# Patient Record
Sex: Female | Born: 1940
Health system: Southern US, Community
[De-identification: ages and names within clinical notes are randomized; demographics above are authoritative.]

## PROBLEM LIST (undated history)

## (undated) DIAGNOSIS — I499 Cardiac arrhythmia, unspecified: Secondary | ICD-10-CM

## (undated) DIAGNOSIS — I2510 Atherosclerotic heart disease of native coronary artery without angina pectoris: Secondary | ICD-10-CM

## (undated) DIAGNOSIS — M1990 Unspecified osteoarthritis, unspecified site: Secondary | ICD-10-CM

## (undated) DIAGNOSIS — Z952 Presence of prosthetic heart valve: Secondary | ICD-10-CM

## (undated) DIAGNOSIS — M159 Polyosteoarthritis, unspecified: Secondary | ICD-10-CM

## (undated) DIAGNOSIS — H3530 Unspecified macular degeneration: Secondary | ICD-10-CM

## (undated) DIAGNOSIS — R0600 Dyspnea, unspecified: Secondary | ICD-10-CM

## (undated) DIAGNOSIS — I10 Essential (primary) hypertension: Secondary | ICD-10-CM

## (undated) DIAGNOSIS — D649 Anemia, unspecified: Secondary | ICD-10-CM

## (undated) DIAGNOSIS — M51379 Other intervertebral disc degeneration, lumbosacral region without mention of lumbar back pain or lower extremity pain: Secondary | ICD-10-CM

## (undated) DIAGNOSIS — I480 Paroxysmal atrial fibrillation: Secondary | ICD-10-CM

## (undated) DIAGNOSIS — M5137 Other intervertebral disc degeneration, lumbosacral region: Secondary | ICD-10-CM

## (undated) DIAGNOSIS — Z9889 Other specified postprocedural states: Secondary | ICD-10-CM

## (undated) DIAGNOSIS — I6529 Occlusion and stenosis of unspecified carotid artery: Secondary | ICD-10-CM

## (undated) DIAGNOSIS — K219 Gastro-esophageal reflux disease without esophagitis: Secondary | ICD-10-CM

## (undated) DIAGNOSIS — R011 Cardiac murmur, unspecified: Secondary | ICD-10-CM

## (undated) DIAGNOSIS — E785 Hyperlipidemia, unspecified: Secondary | ICD-10-CM

## (undated) DIAGNOSIS — R112 Nausea with vomiting, unspecified: Secondary | ICD-10-CM

## (undated) DIAGNOSIS — I872 Venous insufficiency (chronic) (peripheral): Secondary | ICD-10-CM

## (undated) DIAGNOSIS — F0781 Postconcussional syndrome: Secondary | ICD-10-CM

## (undated) DIAGNOSIS — I671 Cerebral aneurysm, nonruptured: Secondary | ICD-10-CM

## (undated) DIAGNOSIS — I82409 Acute embolism and thrombosis of unspecified deep veins of unspecified lower extremity: Secondary | ICD-10-CM

## (undated) HISTORY — DX: Gastro-esophageal reflux disease without esophagitis: K21.9

## (undated) HISTORY — DX: Hyperlipidemia, unspecified: E78.5

## (undated) HISTORY — PX: CARDIAC CATHETERIZATION: SHX172

## (undated) HISTORY — DX: Unspecified macular degeneration: H35.30

## (undated) HISTORY — PX: COLONOSCOPY: SHX174

## (undated) HISTORY — PX: INCISION AND DRAINAGE / EXCISION THYROGLOSSAL CYST: SUR667

## (undated) HISTORY — PX: HAMMER TOE SURGERY: SHX385

## (undated) HISTORY — DX: Venous insufficiency (chronic) (peripheral): I87.2

## (undated) HISTORY — DX: Atherosclerotic heart disease of native coronary artery without angina pectoris: I25.10

## (undated) HISTORY — PX: FRACTURE SURGERY: SHX138

## (undated) HISTORY — DX: Postconcussional syndrome: F07.81

## (undated) HISTORY — PX: FOOT FRACTURE SURGERY: SHX645

## (undated) HISTORY — DX: Occlusion and stenosis of unspecified carotid artery: I65.29

## (undated) HISTORY — DX: Anemia, unspecified: D64.9

## (undated) HISTORY — PX: TONSILLECTOMY: SUR1361

## (undated) HISTORY — DX: Polyosteoarthritis, unspecified: M15.9

## (undated) HISTORY — DX: Cerebral aneurysm, nonruptured: I67.1

## (undated) HISTORY — DX: Acute embolism and thrombosis of unspecified deep veins of unspecified lower extremity: I82.409

## (undated) HISTORY — PX: CATARACT EXTRACTION W/ INTRAOCULAR LENS  IMPLANT, BILATERAL: SHX1307

## (undated) HISTORY — DX: Essential (primary) hypertension: I10

## (undated) HISTORY — PX: JOINT REPLACEMENT: SHX530

## (undated) HISTORY — PX: VAGINAL HYSTERECTOMY: SUR661

## (undated) HISTORY — PX: LAPAROSCOPIC CHOLECYSTECTOMY: SUR755

---

## 1997-06-17 DIAGNOSIS — Z86718 Personal history of other venous thrombosis and embolism: Secondary | ICD-10-CM | POA: Insufficient documentation

## 2001-04-26 DIAGNOSIS — M899 Disorder of bone, unspecified: Secondary | ICD-10-CM | POA: Insufficient documentation

## 2002-05-21 DIAGNOSIS — I872 Venous insufficiency (chronic) (peripheral): Secondary | ICD-10-CM | POA: Insufficient documentation

## 2002-05-21 DIAGNOSIS — M1990 Unspecified osteoarthritis, unspecified site: Secondary | ICD-10-CM | POA: Insufficient documentation

## 2002-05-21 HISTORY — DX: Venous insufficiency (chronic) (peripheral): I87.2

## 2004-10-13 ENCOUNTER — Ambulatory Visit: Payer: Self-pay | Admitting: Cardiology

## 2004-10-17 DIAGNOSIS — M79606 Pain in leg, unspecified: Secondary | ICD-10-CM | POA: Insufficient documentation

## 2004-10-17 DIAGNOSIS — G8929 Other chronic pain: Secondary | ICD-10-CM | POA: Insufficient documentation

## 2004-11-02 ENCOUNTER — Ambulatory Visit: Payer: Self-pay

## 2005-02-06 HISTORY — PX: TOTAL HIP ARTHROPLASTY: SHX124

## 2005-03-17 DIAGNOSIS — Z9660 Presence of unspecified orthopedic joint implant: Secondary | ICD-10-CM | POA: Insufficient documentation

## 2005-11-02 ENCOUNTER — Ambulatory Visit: Payer: Self-pay | Admitting: Cardiology

## 2006-04-12 DIAGNOSIS — H9319 Tinnitus, unspecified ear: Secondary | ICD-10-CM | POA: Insufficient documentation

## 2006-04-17 DIAGNOSIS — I10 Essential (primary) hypertension: Secondary | ICD-10-CM

## 2006-04-17 HISTORY — DX: Essential (primary) hypertension: I10

## 2006-11-05 ENCOUNTER — Ambulatory Visit: Payer: Self-pay | Admitting: Cardiology

## 2006-11-14 ENCOUNTER — Ambulatory Visit: Payer: Self-pay

## 2007-03-15 DIAGNOSIS — Z9849 Cataract extraction status, unspecified eye: Secondary | ICD-10-CM | POA: Insufficient documentation

## 2007-11-06 ENCOUNTER — Ambulatory Visit: Payer: Self-pay | Admitting: Cardiology

## 2007-11-13 ENCOUNTER — Ambulatory Visit: Payer: Self-pay | Admitting: Cardiology

## 2008-11-27 DIAGNOSIS — I82409 Acute embolism and thrombosis of unspecified deep veins of unspecified lower extremity: Secondary | ICD-10-CM

## 2008-11-27 DIAGNOSIS — R002 Palpitations: Secondary | ICD-10-CM | POA: Insufficient documentation

## 2008-11-27 DIAGNOSIS — Q742 Other congenital malformations of lower limb(s), including pelvic girdle: Secondary | ICD-10-CM | POA: Insufficient documentation

## 2008-11-27 HISTORY — DX: Acute embolism and thrombosis of unspecified deep veins of unspecified lower extremity: I82.409

## 2008-12-01 ENCOUNTER — Ambulatory Visit: Payer: Self-pay | Admitting: Cardiology

## 2008-12-01 DIAGNOSIS — I359 Nonrheumatic aortic valve disorder, unspecified: Secondary | ICD-10-CM | POA: Insufficient documentation

## 2008-12-01 DIAGNOSIS — I6529 Occlusion and stenosis of unspecified carotid artery: Secondary | ICD-10-CM

## 2008-12-01 DIAGNOSIS — M25559 Pain in unspecified hip: Secondary | ICD-10-CM | POA: Insufficient documentation

## 2008-12-01 HISTORY — DX: Occlusion and stenosis of unspecified carotid artery: I65.29

## 2008-12-15 ENCOUNTER — Encounter: Payer: Self-pay | Admitting: Cardiology

## 2008-12-16 ENCOUNTER — Encounter: Payer: Self-pay | Admitting: Cardiovascular Disease

## 2008-12-17 ENCOUNTER — Encounter: Payer: Self-pay | Admitting: Cardiology

## 2008-12-17 ENCOUNTER — Ambulatory Visit: Payer: Self-pay

## 2008-12-17 ENCOUNTER — Ambulatory Visit: Payer: Self-pay | Admitting: Cardiology

## 2008-12-17 ENCOUNTER — Ambulatory Visit (HOSPITAL_COMMUNITY): Admission: RE | Admit: 2008-12-17 | Discharge: 2008-12-17 | Payer: Self-pay | Admitting: Cardiology

## 2009-12-02 ENCOUNTER — Encounter: Payer: Self-pay | Admitting: Cardiology

## 2009-12-03 ENCOUNTER — Ambulatory Visit: Payer: Self-pay | Admitting: Cardiology

## 2009-12-17 ENCOUNTER — Ambulatory Visit (HOSPITAL_COMMUNITY): Admission: RE | Admit: 2009-12-17 | Discharge: 2009-12-17 | Payer: Self-pay | Admitting: Cardiology

## 2009-12-17 ENCOUNTER — Ambulatory Visit: Payer: Self-pay | Admitting: Cardiology

## 2009-12-17 ENCOUNTER — Encounter: Payer: Self-pay | Admitting: Cardiology

## 2009-12-17 ENCOUNTER — Ambulatory Visit: Payer: Self-pay

## 2010-03-08 NOTE — Miscellaneous (Signed)
  Clinical Lists Changes  Observations: Added new observation of ECHOINTERP: - Left ventricle: The cavity size was normal. There was mild focal       basal hypertrophy of the septum. Systolic function was normal. The       estimated ejection fraction was in the range of 55% to 60%. Wall       motion was normal; there were no regional wall motion       abnormalities. Doppler parameters are consistent with abnormal       left ventricular relaxation (grade 1 diastolic dysfunction).     - Aortic valve: Valve mobility was restricted. There was moderate       stenosis. Mild regurgitation.     - Mitral valve: Mild regurgitation.     - Atrial septum: There was an atrial septal aneurysm. (02/17/2008 9:54)      Echocardiogram  Procedure date:  02/17/2008  Findings:      - Left ventricle: The cavity size was normal. There was mild focal       basal hypertrophy of the septum. Systolic function was normal. The       estimated ejection fraction was in the range of 55% to 60%. Wall       motion was normal; there were no regional wall motion       abnormalities. Doppler parameters are consistent with abnormal       left ventricular relaxation (grade 1 diastolic dysfunction).     - Aortic valve: Valve mobility was restricted. There was moderate       stenosis. Mild regurgitation.     - Mitral valve: Mild regurgitation.     - Atrial septum: There was an atrial septal aneurysm.

## 2010-03-08 NOTE — Assessment & Plan Note (Signed)
Summary: yearly/sl   Visit Type:  Follow-up Primary Provider:  Mirian Mo  CC:  Aortic Stenosis and Palpitations.  History of Present Illness: The patient presents for followup of the above. Since I last saw her she has had no acute cardiovascular problems. Her echo last year did demonstrate progression of aortic stenosis and mild to moderate. However, she's had no new symptoms such as shortness of breath, PND or orthopnea. She's had no chest pressure, neck or arm discomfort. She doesn't have any other palpitations anymore that she used to have. Unfortunately she is quite limited by joint pains though she is getting physical therapy. She has had no weight gain or edema. She seems to tolerate medicines as listed.  Current Medications (verified): 1)  Verapamil Hcl Cr 240 Mg Xr24h-Cap (Verapamil Hcl) .... Daily 2)  Propranolol Hcl 10 Mg Tabs (Propranolol Hcl) .... Two Times A Day As Needed 3)  Diclofenac Sodium 75 Mg Tbec (Diclofenac Sodium) .... Daily 4)  Alendronate Sodium 35 Mg Tabs (Alendronate Sodium) .... Weekly 5)  Vitamin D 1000 Unit Tabs (Cholecalciferol) .... Daily 6)  Aspirin 81 Mg Tbec (Aspirin) .... Take One Tablet By Mouth Daily 7)  Ibuprofen 200 Mg Tabs (Ibuprofen) .... Daily 8)  Fish Oil 1000 Mg Caps (Omega-3 Fatty Acids) .... Daily 9)  Gabapentin 600 Mg Tabs (Gabapentin) .Marland Kitchen.. 1 By Mouth Three Times A Day  Allergies (verified): 1)  ! Codeine  Past History:  Past Medical History:  1. Palpitations with premature atrial contractions and atrial       tachycardia.   2. Hammertoes.   3. Deep venous thrombosis after broken foot.   4. Moderate aortic stenosis  5. Mild carotid plaque  Past Surgical History: Reviewed history from 12/01/2008 and no changes required.  Tonsillectomy.  Hysterectomy.  Foot surgery.  Left hip replacement  Review of Systems       As stated in the HPI and negative for all other systems.   Vital Signs:  Patient profile:   70 year old  female Height:      66 inches Weight:      169 pounds BMI:     27.38 Pulse rate:   57 / minute Resp:     18 per minute BP sitting:   128 / 86  (right arm)  Vitals Entered By: Marrion Coy, CNA (December 03, 2009 11:18 AM)  Physical Exam  General:  Well developed, well nourished, in no acute distress. Head:  normocephalic and atraumatic Eyes:  PERRLA/EOM intact; conjunctiva and lids normal. Neck:  Neck supple, no JVD. No masses, thyromegaly or abnormal cervical nodes, transmitted systolic murmur Chest Wall:  no deformities or breast masses noted Lungs:  Clear bilaterally to auscultation and percussion. Abdomen:  Bowel sounds positive; abdomen soft and non-tender without masses, organomegaly, or hernias noted. No hepatosplenomegaly. Msk:  Back normal, normal gait. Muscle strength and tone normal. Extremities:  No clubbing or cyanosis. Neurologic:  Alert and oriented x 3. Skin:  Intact without lesions or rashes. Cervical Nodes:  no significant adenopathy Axillary Nodes:  no significant adenopathy Psych:  Normal affect.   Detailed Cardiovascular Exam  Neck    Carotids: Carotids full and equal bilaterally without bruits, transmitted systolic murmur    Neck Veins: Normal, no JVD.    Heart    Inspection: no deformities or lifts noted.      Palpation: normal PMI with no thrills palpable.      Auscultation: S1 and S2 within normal limits, no  S3, no S4, no clicks, no rubs, 3/6 apical systolic murmur radiating up the aortic outflow tract, no diastolic murmurs.  Vascular    Abdominal Aorta: no palpable masses, pulsations, or audible bruits.      Femoral Pulses: normal femoral pulses bilaterally.      Pedal Pulses: normal pedal pulses bilaterally.      Radial Pulses: normal radial pulses bilaterally.      Peripheral Circulation: no clubbing, cyanosis, or edema noted with normal capillary refill.     EKG  Procedure date:  12/03/2009  Findings:      Sinus bradycardia, rate 53,  left ventricular hypertrophy, left axis deviation  Impression & Recommendations:  Problem # 1:  AORTIC VALVE DISORDERS (ICD-424.1) Her AS progressed.  I will repeat an echo this year.  We discussed possible symptoms should it worsen.  Orders: Echocardiogram (Echo)  Problem # 2:  CAROTID STENOSIS (ICD-433.10) I will follow up carotid dopplers this year.  Orders: Carotid Duplex (Carotid Duplex)  Problem # 3:  PALPITATIONS (ICD-785.1) She is not symptomatic on meds as listed and will continue these. Orders: EKG w/ Interpretation (93000)  Patient Instructions: 1)  Your physician recommends that you schedule a follow-up appointment in: 12 months with Dr Antoine Poche 2)  Your physician recommends that you continue on your current medications as directed. Please refer to the Current Medication list given to you today. 3)  Your physician has requested that you have a carotid duplex. This test is an ultrasound of the carotid arteries in your neck. It looks at blood flow through these arteries that supply the brain with blood. Allow one hour for this exam. There are no restrictions or special instructions. 4)  Your physician has requested that you have an echocardiogram.  Echocardiography is a painless test that uses sound waves to create images of your heart. It provides your doctor with information about the size and shape of your heart and how well your heart's chambers and valves are working.  This procedure takes approximately one hour. There are no restrictions for this procedure.

## 2010-05-30 DIAGNOSIS — M159 Polyosteoarthritis, unspecified: Secondary | ICD-10-CM | POA: Insufficient documentation

## 2010-05-30 HISTORY — DX: Polyosteoarthritis, unspecified: M15.9

## 2010-06-14 ENCOUNTER — Telehealth: Payer: Self-pay | Admitting: Cardiology

## 2010-06-14 ENCOUNTER — Encounter: Payer: Self-pay | Admitting: Physician Assistant

## 2010-06-14 NOTE — Telephone Encounter (Signed)
All Cardiac faxed to [facility]/Dr.Prince @ [phone] 06/14/10/km

## 2010-06-16 ENCOUNTER — Ambulatory Visit (INDEPENDENT_AMBULATORY_CARE_PROVIDER_SITE_OTHER): Payer: Medicare Other | Admitting: Physician Assistant

## 2010-06-16 ENCOUNTER — Encounter: Payer: Self-pay | Admitting: Physician Assistant

## 2010-06-16 VITALS — BP 152/70 | HR 76 | Resp 18 | Ht 66.0 in | Wt 171.8 lb

## 2010-06-16 DIAGNOSIS — R079 Chest pain, unspecified: Secondary | ICD-10-CM

## 2010-06-16 DIAGNOSIS — R0602 Shortness of breath: Secondary | ICD-10-CM

## 2010-06-16 DIAGNOSIS — I359 Nonrheumatic aortic valve disorder, unspecified: Secondary | ICD-10-CM

## 2010-06-16 NOTE — Patient Instructions (Addendum)
Your physician recommends that you schedule a follow-up appointment in: 1 WEEK WITH DR. HOCHREIN AS PER SCOTT WEAVER, PA-C, PT IS GOING OUT OF TOWN AND PER SCOTT WEAVER, PA-C PT NEEDS TO SEE DR. HOCHREIN.  Your physician has requested that you have a REST lexiscan Myoview 786.05, 786.50. For further information please visit [email or web address] Please follow instruction sheet, as given.  Your physician has requested that you have an 2D echocardiogram 424.1 AORTIC STENOSIS. Echocardiography is a painless test that uses sound waves to create images of your heart. It provides your doctor with information about the size and shape of your heart and how well your heart's chambers and valves are working. This procedure takes approximately one hour. There are no restrictions for this procedure.  Your physician recommends that you return for lab work in: TODAY BMET, BNP 786.05, 786.50.

## 2010-06-16 NOTE — Assessment & Plan Note (Addendum)
Her symptoms are typical and atypical features.  She has moderately severe aortic stenosis.  I reviewed her echocardiogram with Dr. Eden Emms.  I do not see that she's ever been evaluated for ischemia.  She will be set up for a [employer].  We will also repeat a 2-D echocardiogram.  I will bring her back in close follow up with Dr. Antoine Poche or myself on a day that he is here to review her studies.

## 2010-06-16 NOTE — Assessment & Plan Note (Signed)
Repeat 2-D echocardiogram as noted to assess for progression of aortic stenosis.

## 2010-06-16 NOTE — Assessment & Plan Note (Signed)
Obtain a Myoview and 2-D echocardiogram as noted.  Also obtain basic metabolic panel and BNP.  Add diuretics if her BNP is elevated.

## 2010-06-16 NOTE — Progress Notes (Signed)
History of Present Illness: Primary Cardiologist:  Dr. Rollene Rotunda  Mikayla [name] is a 70 y.o. female With a history of palpitations and aortic stenosis.  She had carotid Dopplers done in November 2011 that demonstrated 0-39% bilateral ICA stenosis.  Her most recent echocardiogram was performed in November 2011 and demonstrated normal LV function with an EF of 60% and moderate to severe aortic stenosis with a mean gradient of 32 mm of mercury.  She is referred back by her primary care physician today secondary to chest symptoms and shortness of breath.  These have been ongoing for the last 2 weeks.  She has a chest tightness.  It can come on at rest.  She can get it with exertion.  She can exert herself without symptoms.  She does have associated shortness of breath with her symptoms.  She describes probable NYHA class II to class IIb symptoms.  She denies orthopnea or PND.  She has some mild pedal edema.  She feels overly fatigued.  She feels as though her abdomen is tight.  She denies any significant weight change.  She does have a problem with her right arm and is being evaluated by her PCP.  There seems to be some pain radiating up into her jaw from this.  She denies any jaw symptoms associated with her chest discomfort.  She denies syncope.  She does have some dizziness.  She denies a spinning sensation.  She denies near syncope.   Past Medical History  Diagnosis Date  . Acute venous embolism and thrombosis of unspecified deep vessels of lower extremity     after broken foot  . Aortic stenosis     echo 11/11: EF 60%, mild LVH, mod/severe AS with mean 32 mmHg, peak 55 mmHg, mild AI, mild MR, (previous echo 2010 with mean gradient 29 mmHg)  . Carotid stenosis     dopplers 11/11: 0-39% bilat  . Palpitations     PACs and atrial tachy  . Other congenital anomaly of toes   . Pain in joint, pelvic region and thigh   . Atrial tachycardia   . Dyslipidemia     Current Outpatient Prescriptions    Medication Sig Dispense Refill  . alendronate (FOSAMAX) 35 MG tablet Take by mouth every 7 (seven) days. Take with a full glass of water on an empty stomach.       Marland Kitchen aspirin 81 MG tablet Take 81 mg by mouth daily.        Marland Kitchen atropine-PHENObarbital-scopolamine-hyoscyamine (DONNATAL) 16.2 MG tablet Take 1 tablet by mouth as needed.        . cholecalciferol (VITAMIN D) 1000 UNITS tablet Take 1,000 Units by mouth daily.        . diclofenac (VOLTAREN) 75 MG EC tablet Take by mouth daily.        Marland Kitchen gabapentin (NEURONTIN) 600 MG tablet Take 600 mg by mouth 2 (two) times daily.        . IBUPROFEN PO Take by mouth. 200 mg daily.       . meloxicam (MOBIC) 15 MG tablet Take 15 mg by mouth daily.        . Multiple Vitamin (MULTIVITAMIN) tablet Take 1 tablet by mouth daily.        Marland Kitchen omega-3 fish oil (MAXEPA) 1000 MG CAPS capsule Take 1 capsule by mouth daily.       . propranolol (INDERAL) 10 MG tablet Take 1 tablet by mouth as directed. Twice a day and every 4 hours  as needed for fast heart rate.      Marland Kitchen VERAPAMIL HCL CR PO Take by mouth. 240 mg Mg Xr24h 1 capsule daily.       Marland Kitchen DISCONTD: GABAPENTIN PO Take by mouth. 600 mg. Take 1 by mouth 3 times a day.        Allergies  Allergen Reactions  . Codeine     caused nausea  . Indomethacin     Caused chest pain.    History  Substance Use Topics  . Smoking status: Never Smoker   . Smokeless tobacco: Not on file  . Alcohol Use: No    Family History  Problem Relation Age of Onset  . Heart disease Mother     ROS:  Please see the history of present illness.  She denies fevers, chills, cough, melena, hematochezia, vomiting, diarrhea.  All other systems reviewed and negative.  Vital Signs: BP 152/70  Pulse 76  Resp 18  Ht 5\' 6"  (1.676 m)  Wt 171 lb 12.8 oz (77.928 kg)  BMI 27.73 kg/m2  PHYSICAL EXAM: Well nourished, well developed, in no acute distress HEENT: normal Neck: no JVD Endocrine: No thyromegaly Vascular: Carotids 2+  bilaterally Cardiac:  normal S1, S2; RRR; Harsh 2/6 crescendo decrescendo systolic murmur heard best at the RUSB Lungs:  clear to auscultation bilaterally, no wheezing, rhonchi or rales Abd: soft, nontender, no hepatomegaly Ext: no edema Skin: warm and dry Neuro:  CNs 2-12 intact, no focal abnormalities noted  EKG:  Sinus rhythm, heart rate 76, left axis deviation, LVH, nonspecific ST-T wave changes, no significant change when compared to tracing dated 12/03/09  ASSESSMENT AND PLAN:

## 2010-06-17 LAB — BASIC METABOLIC PANEL
BUN: 24 mg/dL — ABNORMAL HIGH (ref 6–23)
Calcium: 9.1 mg/dL (ref 8.4–10.5)
Creatinine, Ser: 0.7 mg/dL (ref 0.4–1.2)
GFR: 92.04 mL/min (ref >60.00)
Glucose, Bld: 107 mg/dL — ABNORMAL HIGH (ref 70–99)
Potassium: 3.9 mEq/L (ref 3.5–5.1)

## 2010-06-17 LAB — BRAIN NATRIURETIC PEPTIDE: Pro B Natriuretic peptide (BNP): 186 pg/mL — ABNORMAL HIGH (ref 0.0–100.0)

## 2010-06-20 ENCOUNTER — Telehealth: Payer: Self-pay | Admitting: Cardiology

## 2010-06-20 ENCOUNTER — Encounter: Payer: Self-pay | Admitting: *Deleted

## 2010-06-20 NOTE — Telephone Encounter (Signed)
Pt returning your call

## 2010-06-20 NOTE — Telephone Encounter (Signed)
SEE PHONE NOTE

## 2010-06-21 ENCOUNTER — Ambulatory Visit (HOSPITAL_COMMUNITY): Payer: Medicare Other | Attending: Cardiology | Admitting: Radiology

## 2010-06-21 ENCOUNTER — Ambulatory Visit (HOSPITAL_BASED_OUTPATIENT_CLINIC_OR_DEPARTMENT_OTHER): Payer: Medicare Other | Admitting: Radiology

## 2010-06-21 ENCOUNTER — Encounter: Payer: Self-pay | Admitting: Physician Assistant

## 2010-06-21 ENCOUNTER — Ambulatory Visit (INDEPENDENT_AMBULATORY_CARE_PROVIDER_SITE_OTHER): Payer: Medicare Other | Admitting: Physician Assistant

## 2010-06-21 VITALS — BP 124/76 | HR 80 | Ht 66.0 in | Wt 170.0 lb

## 2010-06-21 DIAGNOSIS — R072 Precordial pain: Secondary | ICD-10-CM

## 2010-06-21 DIAGNOSIS — R0989 Other specified symptoms and signs involving the circulatory and respiratory systems: Secondary | ICD-10-CM

## 2010-06-21 DIAGNOSIS — I4949 Other premature depolarization: Secondary | ICD-10-CM

## 2010-06-21 DIAGNOSIS — R0789 Other chest pain: Secondary | ICD-10-CM

## 2010-06-21 DIAGNOSIS — R0609 Other forms of dyspnea: Secondary | ICD-10-CM

## 2010-06-21 DIAGNOSIS — I359 Nonrheumatic aortic valve disorder, unspecified: Secondary | ICD-10-CM

## 2010-06-21 DIAGNOSIS — R079 Chest pain, unspecified: Secondary | ICD-10-CM

## 2010-06-21 DIAGNOSIS — R0602 Shortness of breath: Secondary | ICD-10-CM

## 2010-06-21 MED ORDER — TECHNETIUM TC 99M TETROFOSMIN IV KIT
11.0000 | PACK | Freq: Once | INTRAVENOUS | Status: AC | PRN
  Administered 2010-06-21: 11 via INTRAVENOUS

## 2010-06-21 MED ORDER — TECHNETIUM TC 99M TETROFOSMIN IV KIT
33.0000 | PACK | Freq: Once | INTRAVENOUS | Status: AC | PRN
  Administered 2010-06-21: 33 via INTRAVENOUS

## 2010-06-21 MED ORDER — REGADENOSON 0.4 MG/5ML IV SOLN
0.4000 mg | Freq: Once | INTRAVENOUS | Status: AC
  Administered 2010-06-21: 0.4 mg via INTRAVENOUS

## 2010-06-21 NOTE — Assessment & Plan Note (Signed)
[facility]                            CARDIOLOGY OFFICE NOTE   NAME:Mikayla [name], Mikayla [name]                          MRN:          [id number]  DATE:11/06/2007                            DOB:          1941-01-10    PRIMARY CARE PHYSICIAN:  Dr. Mirian Mo.   REASON FOR PRESENTATION:  Evaluate the patient with aortic sclerosis and  premature atrial contractions.   HISTORY OF PRESENT ILLNESS:  The patient is 70 years old.  Since I last  saw her, she has been relatively well.  She still gets episodes of  dizziness and lightheadedness, though she has not had any syncope.  This  has been a chronic and long-standing problem.  It happens sporadically.  It may happen a few days in a row and then not happen for weeks at a  time.  She does not associate it with palpitations.  She does not  associate it with change in position.  She does not have any loss of  vision, motor, or speech.  She is not having these with any increasing  frequency or intensity.  She has undergone evaluation with monitoring in  the past.   The patient uses an exercise bike 2-3 times per week.  She has been  having some mild shortness of breath.  This is again episodic and not  really associated with the biking.  She does not bring on any symptoms  when she does this level of exercise or her usual activities.  She is  not having PND or orthopnea.   PAST MEDICAL HISTORY:  1. Palpitations with premature atrial contractions and atrial      tachycardia, moderate aortic sclerosis with mild stenosis, well-      preserved ejection fraction.  2. Hammertoes.  3. Deep venous thrombosis after broken foot.  4. Tonsillectomy.  5. Hysterectomy.  6. Thyroglossal duct cyst resection.  7. Foot surgery.   ALLERGIES:  CODEINE caused nausea, INDOMETHACIN caused chest pain.   MEDICATIONS:  1. Fosamax 35 mg every week.  2. Verapamil 240 mg daily.  3. Multivitamin.  4. Omega-3.  5. Aspirin 81 mg daily.  6. Propranolol 20 mg daily.  7. Diclofenac.   REVIEW OF SYSTEMS:  As stated in the HPI and otherwise negative for all  other systems.   PHYSICAL EXAMINATION:  GENERAL:  The patient is in no distress.  VITAL SIGNS:  Blood pressure 133/80, heart rate 67 and regular, weight  171 pounds, and body mass index 29.  HEENT:  Eyelids unremarkable, pupils are equal, round, and reactive to  light, fundi not visualized, oral mucosa unremarkable.  NECK:  No jugular venous distention, waveform within normal limits,  carotid upstroke brisk and symmetric, bilateral transmitted systolic  murmurs, no thyromegaly.  LYMPHATICS:  No cervical, axillary, or inguinal adenopathy.  LUNGS:  Clear to auscultation bilaterally.  BACK:  No costovertebral angle tenderness.  CHEST:  Unremarkable.  HEART:  PMI not displaced or sustained, S1 and S2 within normal limits.  No S3, S4, 3/6 apical systolic murmur radiating slightly at the right  upper  sternal border and early peaking, no diastolic murmurs.  ABDOMEN:  Flat, positive bowel sounds.  Normal in frequency and pitch,  no bruits, no rebound, no guarding or midline pulsatile mass,  organomegaly.  SKIN:  No rashes, no nodules.  EXTREMITIES:  Pulses are 2+, no edema.   EKG:  Sinus rhythm, rate 64, left ventricular hypertrophy by voltage  criteria, left axis deviation, poor anterior R-wave progression, cannot  exclude lateral infarct.   ASSESSMENT AND PLAN:  1. Dizziness.  The patient has some episodes of dizziness.  This has      been a chronic problem.  There has been no clear etiology.  I do      not believe there to be a cardiac etiology.  She would not want to      wear another monitor, as these are fairly infrequent and not any      worse than previous.  At this point, no further cardiac workup is      suggested.  2. Atrial tachycardia.  The patient had atrial tachycardia and      frequent premature atrial contractions in the past.  I am going to      have  her wear a 24-hour Holter monitor to make sure I see no      evidence of atrial fibrillation, which would necessitate Coumadin.  3. Aortic sclerosis.  This murmur sounds unchanged.  She has had no      symptoms suggesting worsening stenosis.  No further evaluation is      planned.  4. Dyslipidemia, followed by Dr. Criss Alvine.  5. Transmitted systolic murmur in the carotids.  Last year, I      evaluated this with carotid Dopplers, and there was 0 to 39%      plaque.  No followup is necessary at this point.  6. Followup.  We will see her back in 1 year or sooner if she has any      increasing problems or abnormalities on the Holter.      Rollene Rotunda, MD, [facility]  Electronically Signed   JH/MedQ  DD: 11/06/2007  DT: 11/07/2007  Job #: [id number]   cc:   W.D. Criss Alvine

## 2010-06-21 NOTE — Assessment & Plan Note (Signed)
I reviewed her stress test and echocardiogram with her today.  She does not have any ischemia on her stress test.  She does have some symptoms that sound suspicious for increased acid.  I've asked her to get Zantac and take 75 mg twice a day.  She did have some mild elevation in her BNP and she can take the Lasix for 3 days.  She can take as needed after that.

## 2010-06-21 NOTE — Assessment & Plan Note (Signed)
[facility]                            CARDIOLOGY OFFICE NOTE   NAME:Mikayla [name], Mikayla [name]                          MRN:          [id number]  DATE:11/05/2006                            DOB:          10/25/1940    REASON FOR PRESENTATION:  Patient with aortic sclerosis and  palpitations.   HISTORY OF PRESENT ILLNESS:  The patient is now 70 years old.  She has  done well since I last saw her.  She is not describing any new symptoms  consistent with progressive valve disease, that is, she has no new  dyspnea, chest discomfort, pre-syncope or syncope.  She does  occasionally get palpitations and may treat this with an extra dose of  propranolol.  However, this has been very limited.  She is limited in  her activities, because of hip replacement and some discomfort.  She can  ride an exercise bicycle without limitations, aside from the orthopedic  ones.   PAST MEDICAL HISTORY:  Palpitations with premature atrial contractions  and atrial tachycardia, moderate aortic sclerosis with mild stenosis,  well-preserved ejection fraction, hammer toes, deep venous thrombosis  after a broken foot, tonsillectomy, hysterectomy, thyroglossal duct cyst  resection, foot surgery.   ALLERGIES:  CODEINE, CAUSED NAUSEA.  INDOMETHACIN CAUSED CHEST PAIN.   MEDICATIONS:  Fosamax weekly, Verapamil 240 mg daily, multivitamin,  Omega 3, aspirin 81 mg daily.   REVIEW OF SYSTEMS:  As stated in the HPI, otherwise negative for other  systems.   PHYSICAL EXAMINATION:  GENERAL:  The patient is in no distress.  VITAL SIGNS:  Blood pressure 140/80, heart rate 66 and regular, weight  173 pounds, body mass index 29.  HEENT:  Eyes unremarkable.  Pupils equal, round, reactive to light.  Fundi not visualized, oral mucosa unremarkable.  NECK:  No jugular venous distention, wave form within normal limits.  Carotid upstroke brisk and symmetric, bilateral carotid bruits versus  transmitted systolic  murmur, no thyromegaly.  LYMPHATICS:  No supraclavicular, axillary or inguinal adenopathy.  LUNGS:  Clear to auscultation bilaterally.  BACK:  No costovertebral angle tenderness.  CHEST:  Unremarkable.  HEART:  PMI not displaced or sustained, S1 and S2 within normal limits.  No S3, no S4, 3/6 apical systolic murmur, heard best at the right upper  sternal border and early peaking, no diastolic murmurs.  ABDOMEN:  Flat, positive bowel sounds, normal in frequency and pitch, no  bruits, no rebound, no guarding, no midline pulsatile masses or  organomegaly.  SKIN:  No rash, no nodules  EXTREMITIES:  2+ pulses, no edema.   ASSESSMENT/PLAN:  1. Aortic sclerosis/stenosis.  This is probably still mild by the      clinical description.  There is nothing to suggest worsening of      this.  She does have some left ventricular hypertrophy.  At this      point, I would follow this clinically.  I will probably get an      echocardiogram in another year, as it will have been 3 years.  2. Dyslipidemia.  This will be followed by  Dr. Margarito Liner. Prince.  3. Palpitations.  These are not particularly problematic, and she self      medicates with propranolol, which is reasonable.  4. Bruits.  This is either a bruits or transmitted systolic murmur.      Never excluded obstructive carotid disease, and so she should have      a carotid Doppler.  5. Followup.  I will her back in about 12 months or sooner if needed.     Rollene Rotunda, MD, [facility]  Electronically Signed    JH/MedQ  DD: 11/05/2006  DT: 11/05/2006  Job #: [id number]

## 2010-06-21 NOTE — Progress Notes (Signed)
History of Present Illness: Primary Cardiologist:  Dr. Rollene Rotunda  Mikayla [name] is a 70 y.o. female with a history of palpitations and aortic stenosis.  She had carotid Dopplers done in November 2011 that demonstrated 0-39% bilateral ICA stenosis.  Her most recent echocardiogram was performed in November 2011 and demonstrated normal LV function with an EF of 60% and moderate to severe aortic stenosis with a mean gradient of 32 mm of mercury.  I saw her last week with chest pain, dyspnea and ankle edema.  Her BNP was mildly elevated and I recommended she take Lasix 20 mg QD x 3 days.  She has not started this yet.  She was set up for a [employer] and echo which were done today.  I reviewed her Myoview with Dr. Antoine Poche and this demonstrates no ischemia with normal LVF.  I reviewed her echo with Dr. Antoine Poche and this demonstrates stable AS with AVA 1.08, mean gradient of 40 mmHg.  She still has moderately severe AS with significant change.  She notes heaviness with bending forward and increased belching.  No syncope.  No orthopnea or PND.    Past Medical History  Diagnosis Date  . Acute venous embolism and thrombosis of unspecified deep vessels of lower extremity     after broken foot  . Aortic stenosis     echo 11/11: EF 60%, mild LVH, mod/severe AS with mean 32 mmHg, peak 55 mmHg, mild AI, mild MR, (previous echo 2010 with mean gradient 29 mmHg)  . Carotid stenosis     dopplers 11/11: 0-39% bilat  . Palpitations     PACs and atrial tachy  . Other congenital anomaly of toes   . Pain in joint, pelvic region and thigh   . Atrial tachycardia   . Dyslipidemia     Current Outpatient Prescriptions  Medication Sig Dispense Refill  . alendronate (FOSAMAX) 35 MG tablet Take by mouth every 7 (seven) days. Take with a full glass of water on an empty stomach.       Marland Kitchen aspirin 81 MG tablet Take 81 mg by mouth daily.        . cholecalciferol (VITAMIN D) 1000 UNITS tablet Take 1,000 Units by mouth  daily.        Marland Kitchen gabapentin (NEURONTIN) 600 MG tablet Take 600 mg by mouth 2 (two) times daily.        . meloxicam (MOBIC) 15 MG tablet Take 15 mg by mouth daily.        . Multiple Vitamin (MULTIVITAMIN) tablet Take 1 tablet by mouth daily.        Marland Kitchen omega-3 fish oil (MAXEPA) 1000 MG CAPS capsule Take 1 capsule by mouth daily.       . propranolol (INDERAL) 10 MG tablet Take 1 tablet by mouth as directed. Twice a day and every 4 hours as needed for fast heart rate.      Marland Kitchen VERAPAMIL HCL CR PO Take by mouth. 240 mg Mg Xr24h 1 capsule daily.       . furosemide (LASIX) 20 MG tablet Take 1 tablet by mouth daily.      . potassium chloride (K-DUR) 10 MEQ tablet Take 1 tablet by mouth daily.      Marland Kitchen DISCONTD: atropine-PHENObarbital-scopolamine-hyoscyamine (DONNATAL) 16.2 MG tablet Take 1 tablet by mouth as needed.        Marland Kitchen DISCONTD: diclofenac (VOLTAREN) 75 MG EC tablet Take by mouth daily.        Marland Kitchen DISCONTD: IBUPROFEN  PO Take by mouth. 200 mg daily.        No current facility-administered medications for this visit.   Facility-Administered Medications Ordered in Other Visits  Medication Dose Route Frequency Provider Last Rate Last Dose  . regadenoson (LEXISCAN) injection SOLN 0.4 mg  0.4 mg Intravenous Once Rollene Rotunda, MD   0.4 mg at 06/21/10 1024  . technetium tetrofosmin (TC-MYOVIEW) injection 11 milli Curie  11 milli Curie Intravenous Once PRN Rollene Rotunda, MD   11 milli Curie at 06/21/10 [phone]  . technetium tetrofosmin (TC-MYOVIEW) injection 33 milli Curie  33 milli Curie Intravenous Once PRN Rollene Rotunda, MD   33 milli Curie at 06/21/10 1025    Allergies  Allergen Reactions  . Codeine     caused nausea  . Indomethacin     Caused chest pain.    Vital Signs: BP 124/76  Pulse 80  Ht 5\' 6"  (1.676 m)  Wt 170 lb (77.111 kg)  BMI 27.44 kg/m2  PHYSICAL EXAM: Well nourished, well developed, in no acute distress HEENT: normal Neck: no JVD Cardiac:  normal S1, S2; RRR; Harsh 2/6  crescendo decrescendo systolic murmur heard best at the RUSB Lungs:  clear to auscultation bilaterally, no wheezing, rhonchi or rales Abd: soft, nontender, no hepatomegaly Ext: no edema Skin: warm and dry Neuro:  CNs 2-12 intact, no focal abnormalities noted  ASSESSMENT AND PLAN:

## 2010-06-21 NOTE — Patient Instructions (Addendum)
Take Lasix and Potassium once daily for 3 days. Then, take them only as needed (if you notice swelling or increased shortness of breath). Get Zantac 75 mg over the counter and take twice daily for 2-3 weeks.  Then, take as needed.   Your physician recommends that you schedule a follow-up appointment in: 3 MONTHS WITH DR. HOCHREIN 09/27/10 @ 9:45 AM

## 2010-06-21 NOTE — Progress Notes (Addendum)
[facility] SITE 3 NUCLEAR MED [address] [place] [address] [id number]  Cardiology Nuclear Med Study  Mikayla [name] is a 70 y.o. female [id number] 02-Jul-1940   Nuclear Med Background Indication for Stress Test:  Evaluation for Ischemia History:  '11 Echo:EF=60%, moderate-severe AS. Cardiac Risk Factors: Carotid Disease, Family History - CAD, Hypertension and Lipids  Symptoms:  Chest Tightness with and without Exertion (last episode of chest discomfort was last Sunday morning), Dizziness, DOE, Fatigue, Palpitations, Rapid HR and SOB with CP   Nuclear Pre-Procedure Caffeine/Decaff Intake:  None NPO After: 7:00am   Lungs:  Clear.  O2 sat 97% on RA. IV 0.9% NS with Angio Cath:  20g  IV Site: R Antecubital  IV Started by:  Irean Hong, RN  Chest Size (in):  36 Cup Size: C  Height: 5\' 6"  (1.676 m)  Weight:  170 lb (77.111 kg)  BMI:  Body mass index is 27.44 kg/(m^2). Tech Comments:  Took propranolol this am    Nuclear Med Study 1 or 2 day study: 1 day  Stress Test Type:  Lexiscan  Reading MD: Marca Ancona, MD  Order Authorizing Provider:  Rollene Rotunda, MD  Resting Radionuclide: Technetium 27m Tetrofosmin  Resting Radionuclide Dose: 11 mCi   Stress Radionuclide:  Technetium 45m Tetrofosmin  Stress Radionuclide Dose: 33 mCi           Stress Protocol Rest HR: 54 Stress HR: 84  Rest BP: 122/69 Stress BP: 122/78  Exercise Time (min): n/a METS: n/a   Predicted Max HR: 149 bpm % Max HR: 56.38 bpm Rate Pressure Product: 21308   Dose of Adenosine (mg):  n/a Dose of Lexiscan: 0.4 mg  Dose of Atropine (mg): n/a Dose of Dobutamine: n/a mcg/kg/min (at max HR)  Stress Test Technologist: Smiley Houseman, CMA-N  Nuclear Technologist:  Domenic Polite, CNMT     Rest Procedure:  Myocardial perfusion imaging was performed at rest 45 minutes following the intravenous administration of Technetium 51m Tetrofosmin.  Rest ECG: Sinus bradycardia, occasional  PVC's.  Stress Procedure:  The patient received IV Lexiscan 0.4 mg over 15-seconds.  Technetium 46m Tetrofosmin injected at 30-seconds.  There were no significant changes with Lexiscan, only occasional PVC's.  Quantitative spect images were obtained after a 45 minute delay.  Stress ECG: No significant change from baseline ECG  QPS Raw Data Images:  Normal; no motion artifact; normal heart/lung ratio. Stress Images:  Normal homogeneous uptake in all areas of the myocardium. Rest Images:  Normal homogeneous uptake in all areas of the myocardium. Subtraction (SDS):  Normal Transient Ischemic Dilatation (Normal <1.22):  1.05 Lung/Heart Ratio (Normal <0.45):  0.21  Quantitative Gated Spect Images QGS EDV:  84 ml QGS ESV:  26 ml QGS cine images:  NL LV Function; NL Wall Motion QGS EF: 69%  Impression Exercise Capacity:  Lexiscan with no exercise. BP Response:  n/a Clinical Symptoms:  n/a ECG Impression:  No significant ECG changes with Lexiscan. Comparison with Prior Nuclear Study: No previous nuclear study performed  Overall Impression:  Normal stress nuclear study.   Mikayla [name]   Discussed with the patient at the time of the appt.  No new cardiovascular work up needed.  Rollene Rotunda

## 2010-06-21 NOTE — Assessment & Plan Note (Signed)
This continues to be moderately severe.  It does not appear to be the cause of her chest symptoms.  She will follow up with Dr. Antoine Poche in 3 months.  We will likely need to do serial echocardiograms every 6 months for now.

## 2010-06-22 NOTE — Progress Notes (Addendum)
Copy routed to Dr. Antoine Poche.Mikayla [name]

## 2010-06-24 NOTE — Assessment & Plan Note (Signed)
[facility]                              CARDIOLOGY OFFICE NOTE   NAME:Mikayla [name], Mikayla [name]                          MRN:          [id number]  DATE:11/02/2005                            DOB:          [birth year]    PRIMARY:  Dr. Mirian Mo.   REASON FOR PRESENTATION:  Elderly patient with palpitations and aortic  sclerosis.   HISTORY OF PRESENT ILLNESS:  The patient returns for yearly followup.  She  is now 70 years old.  She has done well over the past year.  She had a hip  replacement.  Had no cardiac complications with this.  She is still limited  by joint pains.  She does a little bit of walking through a store or out to  dinner but does not do any routine exercising.  She does not describe any  shortness of breath, chest discomfort, neck discomfort, arm discomfort,  activity induced nausea, vomiting, excessive diaphoresis.  She does not  report any PND or orthopnea.  She does have rare palpitations.  She has had  to take an extra dose of propranolol, probably twice in the last 6 months,  if she thinks she is having palpitations.   PAST MEDICAL HISTORY:  1. Palpitations with premature atrial contractions and atrial tachycardia,      moderate aortic sclerosis with mild stenosis, well-preserved ejection      fraction.  2. Hammertoes.  3. Deep venous thrombosis after a broken foot.  4. Tonsillectomy.  5. Hysterectomy.  6. Thyroid glossal duct cyst resection.  7. Foot surgery.   ALLERGIES:  1. CODEINE CAUSES NAUSEA.  2. INDOMETHACIN CAUSED CHEST PAIN.   MEDICATIONS:  1. Fosamax 35 mg a week.  2. Verapamil 240 mg a day.  3. Multivitamin.  4. Aspirin 325 mg every other day.  5. Propranolol 10 mg b.i.d.  6. Omega-3 fatty acids.   REVIEW OF SYSTEMS:  As stated in the HPI and otherwise negative for other  systems.   PHYSICAL EXAMINATION:  The patient is well-appearing and in no distress.  Weight 167 pounds.  Body mass index 27.  Blood  pressure 101/64.  Heart rate  52 and regular.  NECK:  No jugular venous distention at 90 degrees.  Carotid upstroke brisk  and symmetric, no bruits.  LUNGS:  Clear to auscultation bilaterally.  CHEST:  Unremarkable.  HEART:  PMI not displaced or sustained.  S1 and S2 within normal limits.  No  S3.  No S4.  No murmurs appreciated today.  ABDOMEN:  Soft, positive bowel sounds.  Normal in frequency and pitch, no  bruits, no rebound, no guarding.  No midline pulses or mass or organomegaly.  SKIN:  No rashes.  No nodules.  EXTREMITIES:  2+ pulses, no edema.   ASSESSMENT AND PLAN:  1. Aortic sclerosis.  This is mild.  There is no further cardiovascular      testing indicated.  I do not appreciate it on physical examination.  No      endocarditis prophylaxis is indicated.  She can be followed with serial  physical examinations.  2. Dyslipidemia.  She did have a mildly elevated lipid.  We spent quite a      bit of time talking about this.  Her goal would be a LDL less than 100      which she should approach through therapeutic lifestyle changes.  She      does have an excellent HDL however.  3. Obesity.  The patient actually has a BMI that puts her in the      overweight rather than obese range, which is surprising looking at her      body habitus.  I did describe for her an exercise regimen.  I think she      should do water aerobics or water exercises at the [facility] for weight loss      and lipid control.  4. Palpitations.  These are not particularly problematic.  No further      testing is warranted.  She will continue on the regimen as listed.  5. Followup.  She can come back every year to 18 months.            ______________________________  Rollene Rotunda, MD, [facility]     JH/MedQ  DD:  11/02/2005  DT:  11/03/2005  Job #:  [id number]   cc:   W.D. Judithe Modest, MD

## 2010-07-05 ENCOUNTER — Telehealth: Payer: Self-pay | Admitting: Cardiology

## 2010-07-05 ENCOUNTER — Encounter: Payer: Self-pay | Admitting: Physician Assistant

## 2010-07-05 ENCOUNTER — Ambulatory Visit: Payer: Self-pay | Admitting: Cardiology

## 2010-07-05 NOTE — Telephone Encounter (Signed)
Tried to cb pt but line was busy. Danielle Rankin

## 2010-07-05 NOTE — Telephone Encounter (Signed)
Pt calling re a letter she received re lab work. Pt states the letter states to call carol fiato back. Pt is dr hochrein pt.

## 2010-07-07 ENCOUNTER — Telehealth: Payer: Self-pay | Admitting: *Deleted

## 2010-07-07 NOTE — Telephone Encounter (Signed)
SE PHONE NOTE

## 2010-07-07 NOTE — Telephone Encounter (Signed)
Pt will have bmet done @ Dr. Criss Alvine office in [place], rx for bmet faxed 07/08/10. Danielle Rankin

## 2010-07-08 ENCOUNTER — Telehealth: Payer: Self-pay | Admitting: *Deleted

## 2010-07-08 NOTE — Telephone Encounter (Signed)
See phone note

## 2010-07-08 NOTE — Telephone Encounter (Signed)
Ok

## 2010-07-12 ENCOUNTER — Encounter: Payer: Self-pay | Admitting: Cardiology

## 2010-09-27 ENCOUNTER — Ambulatory Visit: Payer: Medicare Other | Admitting: Cardiology

## 2010-11-14 ENCOUNTER — Ambulatory Visit: Payer: Medicare Other | Admitting: Cardiology

## 2010-12-09 ENCOUNTER — Ambulatory Visit (INDEPENDENT_AMBULATORY_CARE_PROVIDER_SITE_OTHER): Payer: Medicare Other | Admitting: Cardiology

## 2010-12-09 ENCOUNTER — Encounter: Payer: Self-pay | Admitting: Cardiology

## 2010-12-09 VITALS — BP 152/70 | HR 64 | Resp 18 | Ht 66.0 in | Wt 171.8 lb

## 2010-12-09 DIAGNOSIS — I350 Nonrheumatic aortic (valve) stenosis: Secondary | ICD-10-CM

## 2010-12-09 DIAGNOSIS — R002 Palpitations: Secondary | ICD-10-CM

## 2010-12-09 DIAGNOSIS — R079 Chest pain, unspecified: Secondary | ICD-10-CM

## 2010-12-09 DIAGNOSIS — I359 Nonrheumatic aortic valve disorder, unspecified: Secondary | ICD-10-CM

## 2010-12-09 DIAGNOSIS — I6529 Occlusion and stenosis of unspecified carotid artery: Secondary | ICD-10-CM

## 2010-12-09 NOTE — Progress Notes (Signed)
HPI The patient presents for followup of aortic stenosis. I have been following her for years for this. Last year she seemed to have echocardiographic progression of this. Earlier this year she saw Tereso Newcomer PAc. She had some dyspnea and mild chest pain that was felt to be GI.  Her BNP was slightly elevated.  Echo demonstrated AS which was severe but does not appear to be critical.  She returns for six-month followup. She continues to get some dyspnea with activities. However, she says this is somewhat sporadic. She does notice it when she bends over. She notices it sometimes if she walks a moderate distance. However, she is quite limited by back and knee pain. She's not describing any chest pressure, neck or arm discomfort. She's not describing any palpitations, presyncope or syncope. She's had no weight gain or edema.    Allergies  Allergen Reactions  . Codeine     caused nausea  . Indomethacin     Caused chest pain.    Current Outpatient Prescriptions  Medication Sig Dispense Refill  . aspirin 81 MG tablet Take 81 mg by mouth daily.        . cholecalciferol (VITAMIN D) 1000 UNITS tablet Take 1,000 Units by mouth daily.        . furosemide (LASIX) 20 MG tablet Take 1 tablet by mouth daily.      Marland Kitchen gabapentin (NEURONTIN) 600 MG tablet Take 600 mg by mouth 2 (two) times daily.        . meloxicam (MOBIC) 15 MG tablet Take 15 mg by mouth daily.        . Multiple Vitamin (MULTIVITAMIN) tablet Take 1 tablet by mouth daily.        Marland Kitchen omega-3 fish oil (MAXEPA) 1000 MG CAPS capsule Take 1 capsule by mouth daily.       Marland Kitchen omeprazole (PRILOSEC) 40 MG capsule Take 1 capsule by mouth daily.      . potassium chloride (K-DUR) 10 MEQ tablet Take 1 tablet by mouth daily.      . propranolol (INDERAL) 10 MG tablet Take 1 tablet by mouth as directed. Twice a day and every 4 hours as needed for fast heart rate.      Marland Kitchen VERAPAMIL HCL CR PO Take by mouth. 240 mg Mg Xr24h 1 capsule daily.         Past Medical  History  Diagnosis Date  . Acute venous embolism and thrombosis of unspecified deep vessels of lower extremity     after broken foot  . Aortic stenosis     echo 11/11: EF 60%, mild LVH, mod/severe AS with mean 32 mmHg, peak 55 mmHg, mild AI, mild MR, (previous echo 2010 with mean gradient 29 mmHg)  . Carotid stenosis     dopplers 11/11: 0-39% bilat  . Palpitations     PACs and atrial tachy  . Other congenital anomaly of toes   . Pain in joint, pelvic region and thigh   . Atrial tachycardia   . Dyslipidemia     Past Surgical History  Procedure Date  . Tonsillectomy   . Radical hysterectomy   . Foot surgery   . Left hip replacement   . Hyroglossal duct cyst resection     ROS:  As stated in the HPI and negative for all other systems.  PHYSICAL EXAM BP 152/70  Pulse 64  Resp 18  Ht 5\' 6"  (1.676 m)  Wt 171 lb 12.8 oz (77.928 kg)  BMI 27.73 kg/m2  GENERAL:  Well appearing HEENT:  Pupils equal round and reactive, fundi not visualized, oral mucosa unremarkable,dentures NECK:  No jugular venous distention, waveform within normal limits, carotid upstroke brisk and symmetric, no bruits, no thyromegaly, transmitted systolic murmur LYMPHATICS:  No cervical, inguinal adenopathy LUNGS:  Clear to auscultation bilaterally BACK:  No CVA tenderness CHEST:  Unremarkable HEART:  PMI not displaced or sustained,S1 and S2 within normal limits, no S3, no S4, no clicks, no rubs, 3/6 mid to late peaking aortic outflow tract murmur with radiation to the carotids. ABD:  Flat, positive bowel sounds normal in frequency in pitch, no bruits, no rebound, no guarding, no midline pulsatile mass, no hepatomegaly, no splenomegaly EXT:  2 plus pulses throughout, no edema, no cyanosis no clubbing SKIN:  No rashes no nodules NEURO:  Cranial nerves II through XII grossly intact, motor grossly intact throughout PSYCH:  Cognitively intact, oriented to person place and time  EKG:  Sinus bradycardia, rate 45, left  ventricular hypertrophy by voltage criteria, no acute ST-T wave  ASSESSMENT AND PLAN

## 2010-12-09 NOTE — Assessment & Plan Note (Signed)
I suspect that the patient's symptoms are related to her aortic stenosis. She is limited in activities by joint pain which makes it somewhat difficult to assess. However, over the last 18 months she clearly had progression in dyspnea and decreased exercise tolerance. Her AS his severe by echo. Spell long time discussing this with her today.  (Greater than 40 minutes reviewing all data with greater than 50% face to face with the patient).  I tried to prepare her for the eventuality of needing valve replacement.  I will repeat an echo now.  I will plan a cardiac cath early next year to further assess her AS

## 2010-12-09 NOTE — Assessment & Plan Note (Signed)
She tolerates the beta blocker despite bradycardia.  No change in therapy is indicated.

## 2010-12-09 NOTE — Patient Instructions (Signed)
Your physician has requested that you have an echocardiogram. Echocardiography is a painless test that uses sound waves to create images of your heart. It provides your doctor with information about the size and shape of your heart and how well your heart's chambers and valves are working. This procedure takes approximately one hour. There are no restrictions for this procedure.  The current medical regimen is effective;  continue present plan and medications.  Follow up in January with Dr Antoine Poche.

## 2010-12-09 NOTE — Assessment & Plan Note (Signed)
She has mild carotid stenosis.  No change in therapy is indicated.

## 2010-12-09 NOTE — Assessment & Plan Note (Signed)
She and a recent negative stress perfusion study. No further study is indicated at this point.

## 2010-12-15 NOTE — Progress Notes (Signed)
Addended by: Lacie Scotts on: 12/15/2010 10:37 AM   Modules accepted: Orders

## 2011-01-05 ENCOUNTER — Other Ambulatory Visit (HOSPITAL_COMMUNITY): Payer: Self-pay | Admitting: Cardiology

## 2011-01-05 DIAGNOSIS — I350 Nonrheumatic aortic (valve) stenosis: Secondary | ICD-10-CM

## 2011-01-06 ENCOUNTER — Ambulatory Visit (HOSPITAL_COMMUNITY): Payer: Medicare Other | Attending: Cardiology | Admitting: Radiology

## 2011-01-06 DIAGNOSIS — I359 Nonrheumatic aortic valve disorder, unspecified: Secondary | ICD-10-CM | POA: Insufficient documentation

## 2011-01-06 DIAGNOSIS — I350 Nonrheumatic aortic (valve) stenosis: Secondary | ICD-10-CM

## 2011-01-13 ENCOUNTER — Telehealth: Payer: Self-pay | Admitting: Cardiology

## 2011-01-13 NOTE — Telephone Encounter (Signed)
Pt rtn Pam call from yesterday re Echo results, requesting call by 10a

## 2011-02-17 ENCOUNTER — Encounter: Payer: Self-pay | Admitting: Cardiology

## 2011-02-17 ENCOUNTER — Encounter: Payer: Self-pay | Admitting: *Deleted

## 2011-02-17 ENCOUNTER — Ambulatory Visit (INDEPENDENT_AMBULATORY_CARE_PROVIDER_SITE_OTHER): Payer: Medicare Other | Admitting: Cardiology

## 2011-02-17 DIAGNOSIS — I359 Nonrheumatic aortic valve disorder, unspecified: Secondary | ICD-10-CM

## 2011-02-17 DIAGNOSIS — R002 Palpitations: Secondary | ICD-10-CM

## 2011-02-17 NOTE — Patient Instructions (Signed)
Continue current medications as listed.  Follow up in 1 year with Dr Hochrein.  You will receive a letter in the mail 2 months before you are due.  Please call us when you receive this letter to schedule your follow up appointment.  

## 2011-02-17 NOTE — Assessment & Plan Note (Signed)
At this point I think we can continue to follow this closely. I do not think valve replacement is indicated. She can come back and see me in one year with a repeat echocardiogram. However, if she has convincing increasing symptoms between now and then we would need to image her sooner and she understands this. We have reviewed at length the symptoms that could develop.

## 2011-02-17 NOTE — Assessment & Plan Note (Signed)
She is not particularly bothered by this. No change in therapy is indicated.

## 2011-02-17 NOTE — Progress Notes (Signed)
HPI The patient presents for followup of aortic stenosis. I have been following her for years for this. Last year she seemed to have echocardiographic progression of this. Earlier this year she saw Tereso Newcomer PAc. She had some dyspnea and mild chest pain that was felt to be GI.  Her BNP was slightly elevated.  Echo demonstrated AS which was severe but does not appear to be critical.  I saw her last fall and thought she was progressing toward valve replacement.  However echo at that time demonstrated the valve area to be 0.9 with a mean gradient of 25. The EF was still well preserved. Since that time she's had rare fleeting shortness of breath but nothing reproducible and nothing sustained. She's not describing PND or orthopnea. She's not describing chest pressure, neck or arm discomfort. She's had no weight gain or edema. She is limited by her joints.   Allergies  Allergen Reactions  . Codeine     caused nausea  . Indomethacin     Caused chest pain.    Current Outpatient Prescriptions  Medication Sig Dispense Refill  . aspirin 81 MG tablet Take 81 mg by mouth daily.        Marland Kitchen CALCIUM-VITAMIN D PO Take 1 tablet by mouth daily.      . furosemide (LASIX) 20 MG tablet Take 20 mg by mouth as needed.      . gabapentin (NEURONTIN) 600 MG tablet Take 600 mg by mouth 2 (two) times daily.        . meloxicam (MOBIC) 15 MG tablet Take 15 mg by mouth daily.        . Multiple Vitamin (MULTIVITAMIN) tablet Take 1 tablet by mouth daily.        Marland Kitchen omega-3 fish oil (MAXEPA) 1000 MG CAPS capsule Take 1 capsule by mouth daily.       Marland Kitchen omeprazole (PRILOSEC) 40 MG capsule Take 1 capsule by mouth daily.      . propranolol (INDERAL) 10 MG tablet Take 1 tablet by mouth as directed. Twice a day and every 4 hours as needed for fast heart rate.      Marland Kitchen VERAPAMIL HCL CR PO Take by mouth. 240 mg Mg Xr24h 1 capsule daily.         Past Medical History  Diagnosis Date  . Acute venous embolism and thrombosis of  unspecified deep vessels of lower extremity     after broken foot  . Aortic stenosis     echo 11/11: EF 60%, mild LVH, mod/severe AS with mean 32 mmHg, peak 55 mmHg, mild AI, mild MR, (previous echo 2010 with mean gradient 29 mmHg)  . Carotid stenosis     dopplers 11/11: 0-39% bilat  . Palpitations     PACs and atrial tachy  . Other congenital anomaly of toes   . Pain in joint, pelvic region and thigh   . Atrial tachycardia   . Dyslipidemia     Past Surgical History  Procedure Date  . Tonsillectomy   . Radical hysterectomy   . Foot surgery   . Left hip replacement   . Hyroglossal duct cyst resection     ROS:  As stated in the HPI and negative for all other systems.  PHYSICAL EXAM BP 126/71  Pulse 52  Ht 5\' 6"  (1.676 m)  Wt 174 lb (78.926 kg)  BMI 28.08 kg/m2 GENERAL:  Well appearing HEENT:  Pupils equal round and reactive, fundi not visualized, oral mucosa unremarkable,dentures NECK:  No jugular venous distention, waveform within normal limits, carotid upstroke brisk and symmetric, no bruits, no thyromegaly, transmitted systolic murmur LYMPHATICS:  No cervical, inguinal adenopathy LUNGS:  Clear to auscultation bilaterally BACK:  No CVA tenderness CHEST:  Unremarkable HEART:  PMI not displaced or sustained,S1 and S2 within normal limits, no S3, no S4, no clicks, no rubs, 3/6 mid to late peaking aortic outflow tract murmur with radiation to the carotids. ABD:  Flat, positive bowel sounds normal in frequency in pitch, no bruits, no rebound, no guarding, no midline pulsatile mass, no hepatomegaly, no splenomegaly EXT:  2 plus pulses throughout, no edema, no cyanosis no clubbing SKIN:  No rashes no nodules NEURO:  Cranial nerves II through XII grossly intact, motor grossly intact throughout PSYCH:  Cognitively intact, oriented to person place and time  EKG:  Sinus bradycardia, rate 45, left ventricular hypertrophy by voltage criteria, no acute ST-T wave  ASSESSMENT AND  PLAN

## 2011-12-14 ENCOUNTER — Ambulatory Visit (INDEPENDENT_AMBULATORY_CARE_PROVIDER_SITE_OTHER): Payer: Medicare Other | Admitting: Cardiology

## 2011-12-14 ENCOUNTER — Encounter: Payer: Self-pay | Admitting: Cardiology

## 2011-12-14 VITALS — BP 135/68 | HR 51 | Ht 65.0 in | Wt 168.1 lb

## 2011-12-14 DIAGNOSIS — I6529 Occlusion and stenosis of unspecified carotid artery: Secondary | ICD-10-CM

## 2011-12-14 DIAGNOSIS — I359 Nonrheumatic aortic valve disorder, unspecified: Secondary | ICD-10-CM

## 2011-12-14 NOTE — Patient Instructions (Addendum)

## 2011-12-14 NOTE — Progress Notes (Signed)
HPI The patient presents for followup of aortic stenosis. Since I last saw her she has had no new sustained cardiovascular complaints.  She did have some shortness of breath in late July early August. At that time she was short of breath trying to get up out of a chair. She was not describing PND or orthopnea. This resolved. She is now back to her baseline. She denies any chest discomfort presyncope or syncope. She's had no leg swelling. She's had no palpitations. Unfortunately she is limited by back and joint problems.   Allergies  Allergen Reactions  . Codeine     caused nausea  . Indomethacin     Caused chest pain.    Current Outpatient Prescriptions  Medication Sig Dispense Refill  . aspirin 81 MG tablet Take 81 mg by mouth every other day.       Marland Kitchen CALCIUM-VITAMIN D PO Take 1 tablet by mouth daily.      . furosemide (LASIX) 20 MG tablet Take 20 mg by mouth as needed.      . gabapentin (NEURONTIN) 600 MG tablet Take 600 mg by mouth 2 (two) times daily.        . meloxicam (MOBIC) 15 MG tablet Take 15 mg by mouth daily.        . Multiple Vitamin (MULTIVITAMIN) tablet Take 1 tablet by mouth daily.        Marland Kitchen omega-3 fish oil (MAXEPA) 1000 MG CAPS capsule Take 1 capsule by mouth daily.       Marland Kitchen omeprazole (PRILOSEC) 40 MG capsule Take 1 capsule by mouth daily.      . propranolol (INDERAL) 10 MG tablet Take 1 tablet by mouth as directed. Twice a day and every 4 hours as needed for fast heart rate.      Marland Kitchen VERAPAMIL HCL CR PO Take by mouth. 240 mg Mg Xr24h 1 capsule daily.         Past Medical History  Diagnosis Date  . Acute venous embolism and thrombosis of unspecified deep vessels of lower extremity     after broken foot  . Aortic stenosis     echo 11/11: EF 60%, mild LVH, mod/severe AS with mean 32 mmHg, peak 55 mmHg, mild AI, mild MR, (previous echo 2010 with mean gradient 29 mmHg)  . Carotid stenosis     dopplers 11/11: 0-39% bilat  . Palpitations     PACs and atrial tachy  .  Other congenital anomaly of toes   . Pain in joint, pelvic region and thigh   . Atrial tachycardia   . Dyslipidemia     Past Surgical History  Procedure Date  . Tonsillectomy   . Radical hysterectomy   . Foot surgery   . Left hip replacement   . Hyroglossal duct cyst resection     ROS:  As stated in the HPI and negative for all other systems.  PHYSICAL EXAM BP 135/68  Pulse 51  Ht 5\' 5"  (1.651 m)  Wt 168 lb 1.9 oz (76.259 kg)  BMI 27.98 kg/m2 GENERAL:  Well appearing HEENT:  Pupils equal round and reactive, fundi not visualized, oral mucosa unremarkable,dentures NECK:  No jugular venous distention, waveform within normal limits, carotid upstroke brisk and symmetric, no bruits, no thyromegaly, transmitted systolic murmur LUNGS:  Clear to auscultation bilaterally BACK:  No CVA tenderness CHEST:  Unremarkable HEART:  PMI not displaced or sustained,S1 and S2 within normal limits, no S3, no S4, no clicks, no rubs, 3/6 mid  to late peaking aortic outflow tract murmur with radiation to the carotids. ABD:  Flat, positive bowel sounds normal in frequency in pitch, no bruits, no rebound, no guarding, no midline pulsatile mass, no hepatomegaly, no splenomegaly EXT:  2 plus pulses throughout, no edema, no cyanosis no clubbing  EKG:  Sinus bradycardia, rate 51, left ventricular hypertrophy by voltage criteria, no acute ST-T wave.  12/14/2011  ASSESSMENT AND PLAN  Aortic stenosis -  She's currently not having any consistent symptoms to suggest worsening stenosis. I will repeat an echocardiogram.  We again discussed the symptoms that could develop worsening stenosis. She will let me know if any of these develop.  Palpitations -  She is not particularly bothered by this. No change in therapy is indicated.  Dyslipidemia - I did review records from her primary provider with an LDL of 121. At this point she needs good dietary control. Her HDL was very good at 58.4.

## 2011-12-19 ENCOUNTER — Ambulatory Visit (HOSPITAL_COMMUNITY): Payer: Medicare Other | Attending: Cardiology | Admitting: Radiology

## 2011-12-19 DIAGNOSIS — I359 Nonrheumatic aortic valve disorder, unspecified: Secondary | ICD-10-CM

## 2011-12-19 DIAGNOSIS — E785 Hyperlipidemia, unspecified: Secondary | ICD-10-CM | POA: Insufficient documentation

## 2011-12-19 DIAGNOSIS — I517 Cardiomegaly: Secondary | ICD-10-CM | POA: Insufficient documentation

## 2011-12-19 DIAGNOSIS — Z8673 Personal history of transient ischemic attack (TIA), and cerebral infarction without residual deficits: Secondary | ICD-10-CM | POA: Insufficient documentation

## 2011-12-19 DIAGNOSIS — R002 Palpitations: Secondary | ICD-10-CM | POA: Insufficient documentation

## 2011-12-19 NOTE — Progress Notes (Signed)
Echocardiogram performed.  

## 2011-12-22 ENCOUNTER — Telehealth: Payer: Self-pay | Admitting: Cardiology

## 2011-12-22 NOTE — Telephone Encounter (Signed)
Pt returning nurse call she can be reached at [phone]

## 2011-12-22 NOTE — Telephone Encounter (Signed)
Reviewed results of 2 d echo with pt who states understanding

## 2012-12-23 ENCOUNTER — Ambulatory Visit: Payer: Medicare Other | Admitting: Cardiology

## 2013-02-10 ENCOUNTER — Ambulatory Visit (INDEPENDENT_AMBULATORY_CARE_PROVIDER_SITE_OTHER): Payer: Medicare Other | Admitting: Cardiology

## 2013-02-10 ENCOUNTER — Encounter: Payer: Self-pay | Admitting: Cardiology

## 2013-02-10 VITALS — BP 138/84 | HR 49 | Ht 65.0 in | Wt 176.0 lb

## 2013-02-10 DIAGNOSIS — I359 Nonrheumatic aortic valve disorder, unspecified: Secondary | ICD-10-CM | POA: Diagnosis not present

## 2013-02-10 MED ORDER — PROPRANOLOL HCL 10 MG PO TABS: ORAL_TABLET | ORAL | Status: DC

## 2013-02-10 NOTE — Progress Notes (Signed)
HPI The patient presents for followup of aortic stenosis. Since I last saw her she has some progression of her dyspnea. She's very limited because of back pains. With her minimal activity she does get she thinks more short of breath and she did history. She denies any chest pressure, neck or arm discomfort. She's not having any significant palpitations. She does however get occasional dizziness but not presyncope or syncope. She may have some shortness of breath lying down but isn't classically describing PND or orthopnea. She's had some progressive weight gain with some mild lower extremity swelling.     Allergies  Allergen Reactions  . Codeine     caused nausea  . Indomethacin     Caused chest pain.    Current Outpatient Prescriptions  Medication Sig Dispense Refill  . aspirin 81 MG tablet Take 81 mg by mouth every other day.       Marland Kitchen CALCIUM-VITAMIN D PO Take 1 tablet by mouth daily.      Marland Kitchen gabapentin (NEURONTIN) 600 MG tablet Take 600 mg by mouth 3 (three) times daily.       . meloxicam (MOBIC) 15 MG tablet Take 15 mg by mouth daily.        . Multiple Vitamin (MULTIVITAMIN) tablet Take 1 tablet by mouth daily.        Marland Kitchen omega-3 fish oil (MAXEPA) 1000 MG CAPS capsule Take 1 capsule by mouth daily.       Marland Kitchen omeprazole (PRILOSEC) 40 MG capsule Take 1 capsule by mouth daily.      . propranolol (INDERAL) 10 MG tablet Take 1 tablet by mouth as directed. Twice a day and every 4 hours as needed for fast heart rate.      Marland Kitchen VERAPAMIL HCL CR PO Take by mouth. 240 mg Mg Xr24h 1 capsule daily.        No current facility-administered medications for this visit.    Past Medical History  Diagnosis Date  . Acute venous embolism and thrombosis of unspecified deep vessels of lower extremity     after broken foot  . Aortic stenosis     echo 11/11: EF 60%, mild LVH, mod/severe AS with mean 32 mmHg, peak 55 mmHg, mild AI, mild MR, (previous echo 2010 with mean gradient 29 mmHg)  . Carotid stenosis     dopplers 11/11: 0-39% bilat  . Palpitations     PACs and atrial tachy  . Other congenital anomaly of toes   . Pain in joint, pelvic region and thigh   . Atrial tachycardia   . Dyslipidemia     Past Surgical History  Procedure Laterality Date  . Tonsillectomy    . Radical hysterectomy    . Foot surgery    . Left hip replacement    . Hyroglossal duct cyst resection      ROS:  As stated in the HPI and negative for all other systems.  PHYSICAL EXAM BP 138/84  Pulse 49  Ht 5\' 5"  (1.651 m)  Wt 176 lb (79.833 kg)  BMI 29.29 kg/m2 GENERAL:  Well appearing HEENT:  Pupils equal round and reactive, fundi not visualized, oral mucosa unremarkable,dentures NECK:  No jugular venous distention, waveform within normal limits, carotid upstroke brisk and symmetric, no bruits, no thyromegaly, transmitted systolic murmur LUNGS:  Clear to auscultation bilaterally BACK:  No CVA tenderness CHEST:  Unremarkable HEART:  PMI not displaced or sustained,S1 and S2 within normal limits, no S3, no S4, no clicks, no rubs, 3/6  mid to late peaking aortic outflow tract murmur with radiation to the carotids. ABD:  Flat, positive bowel sounds normal in frequency in pitch, no bruits, no rebound, no guarding, no midline pulsatile mass, no hepatomegaly, no splenomegaly EXT:  2 plus pulses throughout, no edema, no cyanosis no clubbing  EKG:  Sinus bradycardia, rate 49, left ventricular hypertrophy by voltage criteria, no acute ST-T wave.  No change from previous  02/10/2013  ASSESSMENT AND PLAN  Aortic stenosis -  Many of her current symptoms could be worsening stenosis. I will start with a repeat echocardiogram but I will have a low threshold for right and left heart catheterization.  Palpitations -  She is not particularly bothered by this. No change in therapy is indicated.  Given her low heart rate I will have her discontinue her scheduled propranolol and take it only as needed.  Dyslipidemia - I have  reviewed her lipids and her last LDL was 148. At this point recommendations would still be for diet control.

## 2013-02-10 NOTE — Patient Instructions (Signed)
Your physician has requested that you have an echocardiogram. Echocardiography is a painless test that uses sound waves to create images of your heart. It provides your doctor with information about the size and shape of your heart and how well your heart's chambers and valves are working. This procedure takes approximately one hour. There are no restrictions for this procedure.  Your physician has recommended you make the following change in your medication:  REDUCE PROPRANOLOL TO 10 MG EVERY 4 HOURS AS NEEDED FOR FAST HEART RATE.   Your physician wants you to follow-up in: 6 MONTHS/ OR SOONER IF NEEDED You will receive a reminder letter in the mail two months in advance. If you don't receive a letter, please call our office to schedule the follow-up appointment.

## 2013-02-12 ENCOUNTER — Other Ambulatory Visit: Payer: Self-pay

## 2013-02-12 MED ORDER — PROPRANOLOL HCL 10 MG PO TABS: ORAL_TABLET | ORAL | Status: DC

## 2013-02-24 ENCOUNTER — Encounter: Payer: Self-pay | Admitting: Cardiovascular Disease

## 2013-02-24 ENCOUNTER — Ambulatory Visit (HOSPITAL_COMMUNITY): Payer: Medicare Other | Attending: Cardiovascular Disease | Admitting: Radiology

## 2013-02-24 ENCOUNTER — Other Ambulatory Visit: Payer: Self-pay

## 2013-02-24 DIAGNOSIS — I498 Other specified cardiac arrhythmias: Secondary | ICD-10-CM | POA: Diagnosis not present

## 2013-02-24 DIAGNOSIS — I359 Nonrheumatic aortic valve disorder, unspecified: Secondary | ICD-10-CM | POA: Diagnosis not present

## 2013-02-24 DIAGNOSIS — R0609 Other forms of dyspnea: Secondary | ICD-10-CM | POA: Insufficient documentation

## 2013-02-24 DIAGNOSIS — R609 Edema, unspecified: Secondary | ICD-10-CM | POA: Insufficient documentation

## 2013-02-24 DIAGNOSIS — R0602 Shortness of breath: Secondary | ICD-10-CM | POA: Diagnosis not present

## 2013-02-24 DIAGNOSIS — R0989 Other specified symptoms and signs involving the circulatory and respiratory systems: Secondary | ICD-10-CM | POA: Insufficient documentation

## 2013-02-24 NOTE — Progress Notes (Signed)
Echocardiogram performed.  

## 2013-03-06 ENCOUNTER — Telehealth: Payer: Self-pay | Admitting: Cardiology

## 2013-03-06 NOTE — Telephone Encounter (Signed)
New message  Patient would like results of echocardiogram. Please call and advise.

## 2013-03-06 NOTE — Telephone Encounter (Signed)
Pt calling for results of ECHO States she called last week & was told " he was out of the office then... I called today & they said he would not be back in until next Thursday" Pt is very concerned that her AV may be worse. She is aware Dr. Percival Spanish is out of the office & will need to review before final report  Pt would like Dr. Percival Spanish to review if he could as soon as possible & she would like to be called with the results.  Reassurance given Forwarded to Dr. Susy Frizzle RN

## 2013-03-11 NOTE — Telephone Encounter (Signed)
Patient states Dr. Percival Spanish left her voicemail on Friday and stated he would call back. There is no documentation of review on the echo for me to report to patient. So I am unable to give any results.  Will route to Dr. Percival Spanish to please review/call patient again.  She will be at home today until 3 PM.

## 2013-03-11 NOTE — Telephone Encounter (Signed)
Patient is very upset and wants a call back ASAP regarding results of her echocardiogram. Please call and advise.

## 2013-03-12 NOTE — Telephone Encounter (Signed)
Follow up     Results on echo. Patient is very upset as to why it's taken so long to hear back on her test results from [place].  Aware that Dr. Percival Spanish  / nurse are in satellite office today.

## 2013-03-12 NOTE — Telephone Encounter (Signed)
Discussed with patient. AS is progressed and I think it is causing symptoms.   I will schedule a right and left cath.  The patient understands that risks included but are not limited to stroke (1 in 1000), death (1 in 98), kidney failure [usually temporary] (1 in 500), bleeding (1 in 200), allergic reaction [possibly serious] (1 in 200).  The patient understands and agrees to proceed.

## 2013-03-13 NOTE — Telephone Encounter (Signed)
Left message for pt to call back.  Dr Percival Spanish can do cath 2/11 or 2/13.  She will need blood work same day since she lives in [place].

## 2013-03-14 ENCOUNTER — Telehealth: Payer: Self-pay | Admitting: Cardiology

## 2013-03-14 NOTE — Telephone Encounter (Signed)
New message          Pt says she is feeling better since she has been off of proprenolol and wants to know if she still needs to do the heart catherization.

## 2013-03-17 ENCOUNTER — Encounter: Payer: Self-pay | Admitting: *Deleted

## 2013-03-17 NOTE — Telephone Encounter (Signed)
Pt is aware of date, time and instructions.  She is to call back with any questions.  Last office note and echo being faxed per her request to her PCP Dr Renford Dills.

## 2013-03-17 NOTE — Telephone Encounter (Signed)
Follow Up     Pt is calling to follow up on her heart cath appointment. Please call.

## 2013-03-17 NOTE — Telephone Encounter (Signed)
Please see original telephone note from 1/29 that was started to schedule the pt for her cath.

## 2013-03-17 NOTE — Telephone Encounter (Signed)
Pt scheduled for right and left cardiac cath for 2/13 at 11:30 am.  She is to report at 8 am (per [facility]) because she needs labs.

## 2013-03-19 ENCOUNTER — Encounter (HOSPITAL_COMMUNITY): Payer: Self-pay | Admitting: Pharmacy Technician

## 2013-03-21 ENCOUNTER — Ambulatory Visit (HOSPITAL_COMMUNITY)
Admission: RE | Admit: 2013-03-21 | Discharge: 2013-03-21 | Disposition: A | Discharge status: Home / Self Care | Payer: Medicare Other | Source: Ambulatory Visit | Attending: Cardiology | Admitting: Cardiology

## 2013-03-21 ENCOUNTER — Other Ambulatory Visit: Payer: Self-pay | Admitting: Cardiology

## 2013-03-21 ENCOUNTER — Encounter (HOSPITAL_COMMUNITY)
Admission: RE | Disposition: A | Discharge status: Home / Self Care | Payer: Self-pay | Source: Ambulatory Visit | Attending: Cardiology

## 2013-03-21 DIAGNOSIS — I359 Nonrheumatic aortic valve disorder, unspecified: Secondary | ICD-10-CM

## 2013-03-21 DIAGNOSIS — R0609 Other forms of dyspnea: Secondary | ICD-10-CM | POA: Diagnosis not present

## 2013-03-21 DIAGNOSIS — R002 Palpitations: Secondary | ICD-10-CM | POA: Diagnosis not present

## 2013-03-21 DIAGNOSIS — I2510 Atherosclerotic heart disease of native coronary artery without angina pectoris: Secondary | ICD-10-CM | POA: Insufficient documentation

## 2013-03-21 DIAGNOSIS — E785 Hyperlipidemia, unspecified: Secondary | ICD-10-CM | POA: Diagnosis not present

## 2013-03-21 DIAGNOSIS — R0989 Other specified symptoms and signs involving the circulatory and respiratory systems: Secondary | ICD-10-CM | POA: Insufficient documentation

## 2013-03-21 DIAGNOSIS — I350 Nonrheumatic aortic (valve) stenosis: Secondary | ICD-10-CM

## 2013-03-21 HISTORY — PX: LEFT AND RIGHT HEART CATHETERIZATION WITH CORONARY ANGIOGRAM: SHX5449

## 2013-03-21 LAB — POCT I-STAT 3, ART BLOOD GAS (G3+)
Bicarbonate: 25.3 mEq/L — ABNORMAL HIGH (ref 20.0–24.0)
O2 Saturation: 92 %
TCO2: 27 mmol/L (ref 0–100)
pCO2 arterial: 42.6 mmHg (ref 35.0–45.0)
pH, Arterial: 7.382 (ref 7.350–7.450)
pO2, Arterial: 66 mmHg — ABNORMAL LOW (ref 80.0–100.0)

## 2013-03-21 LAB — CBC
HEMATOCRIT: 39 % (ref 36.0–46.0)
Hemoglobin: 12.8 g/dL (ref 12.0–15.0)
MCH: 30.3 pg (ref 26.0–34.0)
MCHC: 32.8 g/dL (ref 30.0–36.0)
MCV: 92.4 fL (ref 78.0–100.0)
PLATELETS: 206 10*3/uL (ref 150–400)
RBC: 4.22 MIL/uL (ref 3.87–5.11)
RDW: 13.4 % (ref 11.5–15.5)
WBC: 6.9 10*3/uL (ref 4.0–10.5)

## 2013-03-21 LAB — PROTIME-INR
INR: 1 (ref 0.00–1.49)
Prothrombin Time: 13 seconds (ref 11.6–15.2)

## 2013-03-21 LAB — POCT I-STAT 3, VENOUS BLOOD GAS (G3P V)
Acid-base deficit: 2 mmol/L (ref 0.0–2.0)
Acid-base deficit: 1 mmol/L (ref 0.0–2.0)
BICARBONATE: 24 meq/L (ref 20.0–24.0)
BICARBONATE: 24.5 meq/L — AB (ref 20.0–24.0)
O2 Saturation: 70 %
O2 Saturation: 75 %
PH VEN: 7.349 — AB (ref 7.250–7.300)
TCO2: 25 mmol/L (ref 0–100)
TCO2: 26 mmol/L (ref 0–100)
pCO2, Ven: 43.5 mmHg — ABNORMAL LOW (ref 45.0–50.0)
pCO2, Ven: 44.5 mmHg — ABNORMAL LOW (ref 45.0–50.0)
pH, Ven: 7.35 — ABNORMAL HIGH (ref 7.250–7.300)
pO2, Ven: 38 mmHg (ref 30.0–45.0)
pO2, Ven: 43 mmHg (ref 30.0–45.0)

## 2013-03-21 LAB — BASIC METABOLIC PANEL
BUN: 17 mg/dL (ref 6–23)
CO2: 27 meq/L (ref 19–32)
Calcium: 8.8 mg/dL (ref 8.4–10.5)
Chloride: 106 mEq/L (ref 96–112)
Creatinine, Ser: 0.58 mg/dL (ref 0.50–1.10)
GFR calc Af Amer: >90 mL/min (ref >90)
GFR, EST NON AFRICAN AMERICAN: 89 mL/min — AB (ref >90)
Glucose, Bld: 79 mg/dL (ref 70–99)
POTASSIUM: 4.3 meq/L (ref 3.7–5.3)
SODIUM: 143 meq/L (ref 137–147)

## 2013-03-21 SURGERY — LEFT AND RIGHT HEART CATHETERIZATION WITH CORONARY ANGIOGRAM
Anesthesia: LOCAL

## 2013-03-21 MED ORDER — SODIUM CHLORIDE 0.9 % IV SOLN: 250.0000 mL | INTRAVENOUS | Status: DC | PRN

## 2013-03-21 MED ORDER — HEPARIN (PORCINE) IN NACL 2-0.9 UNIT/ML-% IJ SOLN
INTRAMUSCULAR | Status: AC
  Filled 2013-03-21: qty 1000

## 2013-03-21 MED ORDER — SODIUM CHLORIDE 0.9 % IJ SOLN: 3.0000 mL | Freq: Two times a day (BID) | INTRAMUSCULAR | Status: DC

## 2013-03-21 MED ORDER — SODIUM CHLORIDE 0.9 % IV SOLN
1.0000 mL/kg/h | INTRAVENOUS | Status: DC
  Administered 2013-03-21: 1 mL/kg/h via INTRAVENOUS

## 2013-03-21 MED ORDER — MIDAZOLAM HCL 2 MG/2ML IJ SOLN
INTRAMUSCULAR | Status: AC
  Filled 2013-03-21: qty 2

## 2013-03-21 MED ORDER — ONDANSETRON HCL 4 MG/2ML IJ SOLN: 4.0000 mg | Freq: Four times a day (QID) | INTRAMUSCULAR | Status: DC | PRN

## 2013-03-21 MED ORDER — SODIUM CHLORIDE 0.9 % IJ SOLN: 3.0000 mL | INTRAMUSCULAR | Status: DC | PRN

## 2013-03-21 MED ORDER — LIDOCAINE HCL (PF) 1 % IJ SOLN
INTRAMUSCULAR | Status: AC
  Filled 2013-03-21: qty 30

## 2013-03-21 MED ORDER — ACETAMINOPHEN 325 MG PO TABS: 650.0000 mg | ORAL_TABLET | ORAL | Status: DC | PRN

## 2013-03-21 MED ORDER — SODIUM CHLORIDE 0.9 % IV SOLN
INTRAVENOUS | Status: DC
Start: 2013-03-21 — End: 2013-03-21

## 2013-03-21 MED ORDER — ASPIRIN 81 MG PO CHEW
81.0000 mg | CHEWABLE_TABLET | ORAL | Status: AC
  Administered 2013-03-21: 81 mg via ORAL
  Filled 2013-03-21: qty 1

## 2013-03-21 NOTE — CV Procedure (Signed)
   Cardiac Catheterization Procedure Note  Name: Mikayla [name] MRN: [id number] DOB: [birth year]  Procedure: Right Heart Cath, Left Heart Cath, Selective Coronary Angiography, LV angiography  Indication:   Aortic stenosis with dyspnea.  Procedural Details: The right groin was prepped, draped, and anesthetized with 1% lidocaine. Using the modified Seldinger technique a 6 French sheath was placed in the right femoral artery and a 7 French sheath was placed in the right femoral vein. A Swan-Ganz catheter was used for the right heart catheterization. Standard protocol was followed for recording of right heart pressures and sampling of oxygen saturations. Fick cardiac output was calculated. Standard Judkins catheters were used for selective coronary angiography and left ventriculography. There were no immediate procedural complications. The patient was transferred to the post catheterization recovery area for further monitoring.  Procedural Findings:  Hemodynamics:               RA 1    RV 30/1    PA 26/7  Mean 16    PCWP 8    LV 182/5    AO 165/75    AO gradient mean 13.3    AoV area:  1.76   Oxygen saturations:    PA 92%    AO 70%   Cardiac Output (Thermo) 4.98                               Cardiac Index (Fick) 2.63   Coronary angiography:  Coronary dominance: right  Left mainstem: Short normal with 40% stenosis  Left anterior descending (LAD):   Large wrapping the apex.   Proximal 25% stenosis.  D1 small with ostial 60% stenosis.  D2 small and normal  Left circumflex (LCx): Large and normal in the AV groove.  MOM large and normal.  PL1 large normal,  PL 2 small and normal.   Right coronary artery (RCA): Small but dominant.  Normal.  PDA small and normal.   Left ventriculography: Left ventricular systolic function is normal , LVEF is estimated at 70%, there is no significant mitral regurgitation   Final Conclusions:  Mild coronary plaque.  NL LV function.  Moderate AS.  Normal  right sided pressures.  Recommendations: Continue medical management. No indication for AVR.    Minus Breeding 03/21/2013, 11:43 AM

## 2013-03-21 NOTE — Interval H&P Note (Signed)
History and Physical Interval Note:  03/21/2013 10:24 AM  Mikayla [name]  has presented today for surgery, with the diagnosis of Aortic stenosis  The various methods of treatment have been discussed with the patient and family. After consideration of risks, benefits and other options for treatment, the patient has consented to  Procedure(s): LEFT AND RIGHT HEART CATHETERIZATION WITH CORONARY ANGIOGRAM (N/A) as a surgical intervention .  The patient's history has been reviewed, patient examined, no change in status, stable for surgery.  I have reviewed the patient's chart and labs.  Questions were answered to the patient's satisfaction.    She has had increasing symptoms with echo results as below.  - Left ventricle: The cavity size was normal. Wall thickness was increased in a pattern of moderate LVH. There was focal basal hypertrophy. Systolic function was normal. The estimated ejection fraction was in the range of 60% to 65%. Wall motion was normal; there were no regional wall motion abnormalities. - Aortic valve: There was moderate to severe stenosis. Mild regurgitation. Mean gradient: 41mm Hg (S). Peak gradient: 32mm Hg (S). Cath Lab Visit (complete for each Cath Lab visit)  Clinical Evaluation Leading to the Procedure:   ACS: no  Non-ACS:    Anginal Classification: CCS IV  Anti-ischemic medical therapy: Maximal Therapy (2 or more classes of medications)  Non-Invasive Test Results: No non-invasive testing performed  Prior CABG: No previous CABG   Minus Breeding

## 2013-03-21 NOTE — H&P (Signed)
Mikayla [name]  MRN:  [id number]  Provider: Minus Breeding, MD    Aortic valve disorders    -  Primary   HPI The patient presents for followup of aortic stenosis. Since I last saw her she has some progression of her dyspnea. She's very limited because of back pains. With her minimal activity she does get she thinks more short of breath and she did history. She denies any chest pressure, neck or arm discomfort. She's not having any significant palpitations. She does however get occasional dizziness but not presyncope or syncope. She may have some shortness of breath lying down but isn't classically describing PND or orthopnea. She's had some progressive weight gain with some mild lower extremity swelling.              Allergies   Allergen  Reactions   .  Codeine         caused nausea   .  Indomethacin         Caused chest pain.      Current Outpatient Prescriptions   .  aspirin 81 MG tablet  Take 81 mg by mouth every other day.          Marland Kitchen  CALCIUM-VITAMIN D PO  Take 1 tablet by mouth daily.         Marland Kitchen  gabapentin (NEURONTIN) 600 MG tablet  Take 600 mg by mouth 3 (three) times daily.          .  meloxicam (MOBIC) 15 MG tablet  Take 15 mg by mouth daily.           .  Multiple Vitamin (MULTIVITAMIN) tablet  Take 1 tablet by mouth daily.           Marland Kitchen  omega-3 fish oil (MAXEPA) 1000 MG CAPS capsule  Take 1 capsule by mouth daily.          Marland Kitchen  omeprazole (PRILOSEC) 40 MG capsule  Take 1 capsule by mouth daily.         .  propranolol (INDERAL) 10 MG tablet  Take 1 tablet by mouth as directed. Twice a day and every 4 hours as needed for fast heart rate.         Marland Kitchen  VERAPAMIL HCL CR PO  Take by mouth. 240 mg Mg Xr24h 1 capsule daily.            Past Medical History   .  Acute venous embolism and thrombosis of unspecified deep vessels of lower extremity         after broken foot   .  Aortic stenosis         echo 11/11: EF 60%, mild LVH, mod/severe AS with mean 32 mmHg, peak 55 mmHg, mild AI, mild  MR, (previous echo 2010 with mean gradient 29 mmHg)   .  Carotid stenosis         dopplers 11/11: 0-39% bilat   .  Palpitations         PACs and atrial tachy   .  Other congenital anomaly of toes     .  Pain in joint, pelvic region and thigh     .  Atrial tachycardia     .  Dyslipidemia         Past Surgical History   .  Tonsillectomy       .  Radical hysterectomy       .  Foot surgery       .  Left hip replacement       .  Hyroglossal duct cyst resection         ROS:  As stated in the HPI and negative for all other systems.   PHYSICAL EXAM BP 138/84  Pulse 49  Ht 5\' 5"  (1.651 m)  Wt 176 lb (79.833 kg)  BMI 29.29 kg/m2 GENERAL:  Well appearing HEENT:  Pupils equal round and reactive, fundi not visualized, oral mucosa unremarkable,dentures NECK:  No jugular venous distention, waveform within normal limits, carotid upstroke brisk and symmetric, no bruits, no thyromegaly, transmitted systolic murmur LUNGS:  Clear to auscultation bilaterally BACK:  No CVA tenderness CHEST:  Unremarkable HEART:  PMI not displaced or sustained,S1 and S2 within normal limits, no S3, no S4, no clicks, no rubs, 3/6 mid to late peaking aortic outflow tract murmur with radiation to the carotids. ABD:  Flat, positive bowel sounds normal in frequency in pitch, no bruits, no rebound, no guarding, no midline pulsatile mass, no hepatomegaly, no splenomegaly EXT:  2 plus pulses throughout, no edema, no cyanosis no clubbing   EKG:  Sinus bradycardia, rate 49, left ventricular hypertrophy by voltage criteria, no acute ST-T wave.  No change from previous  02/10/2013   ASSESSMENT AND PLAN   Aortic stenosis -  Many of her current symptoms could be worsening stenosis. I will start with a repeat echocardiogram but I will have a low threshold for right and left heart catheterization.  Palpitations -  She is not particularly bothered by this. No change in therapy is indicated.  Given her low heart rate I will  have her discontinue her scheduled propranolol and take it only as needed.   Dyslipidemia - I have reviewed her lipids and her last LDL was 148. At this point recommendations would still be for diet control.

## 2013-03-21 NOTE — Discharge Instructions (Signed)
Angiography, Care After Refer to this sheet in the next few weeks. These instructions provide you with information on caring for yourself after your procedure. Your health care provider may also give you more specific instructions. Your treatment has been planned according to current medical practices, but problems sometimes occur. Call your health care provider if you have any problems or questions after your procedure.  WHAT TO EXPECT AFTER THE PROCEDURE After your procedure, it is typical to have the following sensations:  Minor discomfort or tenderness and a small bump at the catheter insertion site. The bump should usually decrease in size and tenderness within 1 to 2 weeks.  Any bruising will usually fade within 2 to 4 weeks. HOME CARE INSTRUCTIONS   You may need to keep taking blood thinners if they were prescribed for you. Only take over-the-counter or prescription medicines for pain, fever, or discomfort as directed by your health care provider.  Do not apply powder or lotion to the site.  Do not sit in a bathtub, swimming pool, or whirlpool for 5 to 7 days.  You may shower 24 hours after the procedure. Remove the bandage (dressing) and gently wash the site with plain soap and water. Gently pat the site dry.  Inspect the site at least twice daily.  Limit your activity for the first 48 hours. Do not bend, squat, or lift anything over 10 lb or as directed by your health care provider.  Do not drive home if you are discharged the day of the procedure. Have someone else drive you. Follow instructions about when you can drive or return to work. SEEK MEDICAL CARE IF:  You get lightheaded when standing up.  You have drainage (other than a small amount of blood on the dressing).  You have chills.  You have a fever.  You have redness, warmth, swelling, or pain at the insertion site. SEEK IMMEDIATE MEDICAL CARE IF:   You develop chest pain or shortness of breath, feel faint, or  pass out.  You have bleeding, swelling larger than a walnut, or drainage from the catheter insertion site.  You develop pain, discoloration, coldness, or severe bruising in the leg or arm that held the catheter.  You have heavy bleeding from the site. If this happens, hold pressure on the site. MAKE SURE YOU:  Understand these instructions.  Will watch your condition.  Will get help right away if you are not doing well or get worse. Document Released: 08/11/2004 Document Revised: 09/25/2012 Document Reviewed: 06/17/2012 [facility] Patient Information 2014 [employer].

## 2013-05-02 DIAGNOSIS — H2640 Unspecified secondary cataract: Secondary | ICD-10-CM | POA: Diagnosis not present

## 2013-07-28 ENCOUNTER — Encounter: Payer: Self-pay | Admitting: Cardiology

## 2013-07-28 ENCOUNTER — Ambulatory Visit (INDEPENDENT_AMBULATORY_CARE_PROVIDER_SITE_OTHER): Payer: Medicare Other | Admitting: Cardiology

## 2013-07-28 VITALS — BP 128/74 | HR 85 | Ht 66.0 in | Wt 174.0 lb

## 2013-07-28 DIAGNOSIS — I359 Nonrheumatic aortic valve disorder, unspecified: Secondary | ICD-10-CM | POA: Diagnosis not present

## 2013-07-28 DIAGNOSIS — I82409 Acute embolism and thrombosis of unspecified deep veins of unspecified lower extremity: Secondary | ICD-10-CM | POA: Diagnosis not present

## 2013-07-28 DIAGNOSIS — E669 Obesity, unspecified: Secondary | ICD-10-CM | POA: Insufficient documentation

## 2013-07-28 NOTE — Patient Instructions (Signed)
The current medical regimen is effective;  continue present plan and medications.  Your physician has requested that you have an echocardiogram the same day as your January appointment to see Dr Percival Spanish. Echocardiography is a painless test that uses sound waves to create images of your heart. It provides your doctor with information about the size and shape of your heart and how well your heart's chambers and valves are working. This procedure takes approximately one hour. There are no restrictions for this procedure.  You are due back in January to see Dr Percival Spanish at the [facility] office.  You will be contacted to schedule the appointment once it is closer to time.

## 2013-07-28 NOTE — Progress Notes (Signed)
HPI The patient presents for followup of aortic stenosis. She did have an echocardiogram which suggested that her stenosis with progressive and severe. At that time she was having some symptoms of shortness of breath. However, I did a cardiac catheterization which demonstrated some mild nonobstructive disease. However, her valve gradient was only moderate at most.  Because of some bradycardia and dizziness I did stop her daily propranolol and she only takes this when necessary now. She thinks she does much better with this. On 10 occasions she has had a when necessary propranolol because of an unusual feeling in her chest. However, she's not having as much fatigue as she was having. She denies any presyncope or syncope. She's not having any new shortness of breath and has no PND or orthopnea. She is still limited by back and joint pains but does her chores of daily living without significant limitations.  Allergies  Allergen Reactions  . Codeine Nausea And Vomiting  . Indomethacin Nausea And Vomiting    Caused chest pain.    Current Outpatient Prescriptions  Medication Sig Dispense Refill  . aspirin 81 MG tablet Take 81 mg by mouth every other day.       Marland Kitchen CALCIUM-VITAMIN D PO Take 1 tablet by mouth daily.      Marland Kitchen gabapentin (NEURONTIN) 600 MG tablet Take 600 mg by mouth 3 (three) times daily.       . Hypromellose (GENTEAL) 0.3 % SOLN Apply 1-2 drops to eye daily as needed (for dry eyes).      . meloxicam (MOBIC) 15 MG tablet Take 15 mg by mouth daily.        . Multiple Vitamin (MULTIVITAMIN) tablet Take 1 tablet by mouth daily.        . Omega 3 1000 MG CAPS Take 1,000 mg by mouth daily.      Marland Kitchen omeprazole (PRILOSEC) 40 MG capsule Take 1 capsule by mouth daily.      . propranolol (INDERAL) 10 MG tablet Take 10 mg by mouth every 4 (four) hours as needed (for fast heart rate).      . verapamil (CALAN-SR) 240 MG CR tablet Take 240 mg by mouth at bedtime.       No current facility-administered  medications for this visit.    Past Medical History  Diagnosis Date  . Acute venous embolism and thrombosis of unspecified deep vessels of lower extremity     after broken foot  . Aortic stenosis     echo 11/11: EF 60%, mild LVH, mod/severe AS with mean 32 mmHg, peak 55 mmHg, mild AI, mild MR, (previous echo 2010 with mean gradient 29 mmHg)  . Carotid stenosis     dopplers 11/11: 0-39% bilat  . Palpitations     PACs and atrial tachy  . Other congenital anomaly of toes   . Pain in joint, pelvic region and thigh   . Atrial tachycardia   . Dyslipidemia     Past Surgical History  Procedure Laterality Date  . Tonsillectomy    . Radical hysterectomy    . Foot surgery    . Left hip replacement    . Hyroglossal duct cyst resection      ROS:  As stated in the HPI and negative for all other systems.  PHYSICAL EXAM BP 128/74  Pulse 85  Ht 5\' 6"  (1.676 m)  Wt 174 lb (78.926 kg)  BMI 28.10 kg/m2 GENERAL:  Well appearing HEENT:  Pupils equal round and reactive, fundi  not visualized, oral mucosa unremarkable,dentures NECK:  No jugular venous distention, waveform within normal limits, carotid upstroke brisk and symmetric, no bruits, no thyromegaly, transmitted systolic murmur LUNGS:  Clear to auscultation bilaterally BACK:  No CVA tenderness CHEST:  Unremarkable HEART:  PMI not displaced or sustained,S1 and S2 within normal limits, no S3, no S4, no clicks, no rubs, 3/6 mid to late peaking aortic outflow tract murmur with radiation to the carotids. ABD:  Flat, positive bowel sounds normal in frequency in pitch, no bruits, no rebound, no guarding, no midline pulsatile mass, no hepatomegaly, no splenomegaly EXT:  2 plus pulses throughout, no edema, no cyanosis no clubbing  EKG:  Sinus bradycardia, rate 85, left ventricular hypertrophy by voltage criteria, no acute ST-T wave.  No change from previous  07/28/2013  ASSESSMENT AND PLAN  Aortic stenosis -  The echocardiogram suggested  severe stenosis of the cath demonstrated moderate. Is coming off of her beta blocker chronically she actually feels better and is not having any acute dyspnea. His had no presyncope or syncope. She's had no chest pain or other suggestion that she would need valve replacement. I will follow this up with another echocardiogram early next year and I will see her at that time. She does if she develops any of the above symptoms she needs to let me know.  Palpitations -  She is doing very well taking only when necessary propranolol and she will continue with this.  Dyslipidemia - The last LDL that I had was 148.  At that time she had no indication for treatment.  However, with some known nonobstructive coronary disease when she gets her lipids checked later this year I would like to review this and would have a lower threshold for starting a statin.

## 2013-10-07 DIAGNOSIS — M549 Dorsalgia, unspecified: Secondary | ICD-10-CM | POA: Diagnosis not present

## 2013-10-07 DIAGNOSIS — R82998 Other abnormal findings in urine: Secondary | ICD-10-CM | POA: Diagnosis not present

## 2013-11-27 DIAGNOSIS — M150 Primary generalized (osteo)arthritis: Secondary | ICD-10-CM | POA: Diagnosis not present

## 2013-11-27 DIAGNOSIS — I10 Essential (primary) hypertension: Secondary | ICD-10-CM | POA: Diagnosis not present

## 2013-11-27 DIAGNOSIS — Z23 Encounter for immunization: Secondary | ICD-10-CM | POA: Diagnosis not present

## 2013-11-27 DIAGNOSIS — Z0000 Encounter for general adult medical examination without abnormal findings: Secondary | ICD-10-CM | POA: Diagnosis not present

## 2013-11-27 DIAGNOSIS — I350 Nonrheumatic aortic (valve) stenosis: Secondary | ICD-10-CM | POA: Diagnosis not present

## 2013-11-28 DIAGNOSIS — E669 Obesity, unspecified: Secondary | ICD-10-CM | POA: Diagnosis not present

## 2013-11-28 DIAGNOSIS — I10 Essential (primary) hypertension: Secondary | ICD-10-CM | POA: Diagnosis not present

## 2013-11-28 DIAGNOSIS — I350 Nonrheumatic aortic (valve) stenosis: Secondary | ICD-10-CM | POA: Diagnosis not present

## 2013-12-21 DIAGNOSIS — N390 Urinary tract infection, site not specified: Secondary | ICD-10-CM | POA: Diagnosis not present

## 2013-12-21 DIAGNOSIS — I4891 Unspecified atrial fibrillation: Secondary | ICD-10-CM | POA: Diagnosis not present

## 2013-12-21 DIAGNOSIS — I2510 Atherosclerotic heart disease of native coronary artery without angina pectoris: Secondary | ICD-10-CM | POA: Diagnosis not present

## 2013-12-21 DIAGNOSIS — I350 Nonrheumatic aortic (valve) stenosis: Secondary | ICD-10-CM | POA: Diagnosis not present

## 2013-12-21 DIAGNOSIS — J9811 Atelectasis: Secondary | ICD-10-CM | POA: Diagnosis not present

## 2013-12-21 DIAGNOSIS — Z0289 Encounter for other administrative examinations: Secondary | ICD-10-CM | POA: Diagnosis not present

## 2013-12-21 DIAGNOSIS — I77810 Thoracic aortic ectasia: Secondary | ICD-10-CM | POA: Diagnosis not present

## 2013-12-21 DIAGNOSIS — K219 Gastro-esophageal reflux disease without esophagitis: Secondary | ICD-10-CM | POA: Diagnosis not present

## 2013-12-21 DIAGNOSIS — R079 Chest pain, unspecified: Secondary | ICD-10-CM | POA: Diagnosis not present

## 2013-12-21 DIAGNOSIS — R0789 Other chest pain: Secondary | ICD-10-CM | POA: Diagnosis not present

## 2013-12-21 DIAGNOSIS — I480 Paroxysmal atrial fibrillation: Secondary | ICD-10-CM | POA: Diagnosis not present

## 2013-12-21 DIAGNOSIS — R072 Precordial pain: Secondary | ICD-10-CM | POA: Diagnosis not present

## 2013-12-22 DIAGNOSIS — R0789 Other chest pain: Secondary | ICD-10-CM | POA: Diagnosis present

## 2013-12-22 DIAGNOSIS — R079 Chest pain, unspecified: Secondary | ICD-10-CM | POA: Diagnosis not present

## 2013-12-22 DIAGNOSIS — I4891 Unspecified atrial fibrillation: Secondary | ICD-10-CM | POA: Diagnosis not present

## 2013-12-22 DIAGNOSIS — I480 Paroxysmal atrial fibrillation: Secondary | ICD-10-CM | POA: Diagnosis present

## 2013-12-22 DIAGNOSIS — I2510 Atherosclerotic heart disease of native coronary artery without angina pectoris: Secondary | ICD-10-CM | POA: Diagnosis present

## 2013-12-22 DIAGNOSIS — I371 Nonrheumatic pulmonary valve insufficiency: Secondary | ICD-10-CM | POA: Diagnosis not present

## 2013-12-22 DIAGNOSIS — N390 Urinary tract infection, site not specified: Secondary | ICD-10-CM | POA: Diagnosis not present

## 2013-12-22 DIAGNOSIS — I071 Rheumatic tricuspid insufficiency: Secondary | ICD-10-CM | POA: Diagnosis not present

## 2013-12-22 DIAGNOSIS — I351 Nonrheumatic aortic (valve) insufficiency: Secondary | ICD-10-CM | POA: Diagnosis not present

## 2013-12-22 DIAGNOSIS — I350 Nonrheumatic aortic (valve) stenosis: Secondary | ICD-10-CM | POA: Diagnosis present

## 2013-12-22 DIAGNOSIS — K219 Gastro-esophageal reflux disease without esophagitis: Secondary | ICD-10-CM | POA: Diagnosis present

## 2013-12-22 DIAGNOSIS — Z885 Allergy status to narcotic agent status: Secondary | ICD-10-CM | POA: Diagnosis not present

## 2013-12-22 DIAGNOSIS — Z881 Allergy status to other antibiotic agents status: Secondary | ICD-10-CM | POA: Diagnosis not present

## 2013-12-22 DIAGNOSIS — G8929 Other chronic pain: Secondary | ICD-10-CM | POA: Diagnosis present

## 2013-12-31 ENCOUNTER — Telehealth: Payer: Self-pay | Admitting: Cardiology

## 2013-12-31 NOTE — Telephone Encounter (Signed)
Close encounter 

## 2014-01-05 ENCOUNTER — Telehealth: Payer: Self-pay | Admitting: Cardiology

## 2014-01-05 NOTE — Telephone Encounter (Signed)
Received records from [facility] [place] for appointment with Dr Percival Spanish on 02/16/14.  Records given to [facility] (Medical Records) for Dr Hochrein's schedule on 02/16/14.  lp

## 2014-01-14 DIAGNOSIS — H35363 Drusen (degenerative) of macula, bilateral: Secondary | ICD-10-CM | POA: Diagnosis not present

## 2014-01-14 DIAGNOSIS — Z961 Presence of intraocular lens: Secondary | ICD-10-CM | POA: Diagnosis not present

## 2014-01-15 ENCOUNTER — Encounter (HOSPITAL_COMMUNITY): Payer: Self-pay | Admitting: Cardiology

## 2014-01-20 DIAGNOSIS — I350 Nonrheumatic aortic (valve) stenosis: Secondary | ICD-10-CM | POA: Diagnosis not present

## 2014-01-20 DIAGNOSIS — I872 Venous insufficiency (chronic) (peripheral): Secondary | ICD-10-CM | POA: Diagnosis not present

## 2014-01-20 DIAGNOSIS — I471 Supraventricular tachycardia: Secondary | ICD-10-CM | POA: Diagnosis not present

## 2014-01-20 DIAGNOSIS — I4891 Unspecified atrial fibrillation: Secondary | ICD-10-CM | POA: Diagnosis not present

## 2014-01-21 DIAGNOSIS — I4891 Unspecified atrial fibrillation: Secondary | ICD-10-CM | POA: Diagnosis not present

## 2014-01-21 DIAGNOSIS — I350 Nonrheumatic aortic (valve) stenosis: Secondary | ICD-10-CM | POA: Diagnosis not present

## 2014-01-21 DIAGNOSIS — M1990 Unspecified osteoarthritis, unspecified site: Secondary | ICD-10-CM | POA: Diagnosis not present

## 2014-01-21 DIAGNOSIS — I872 Venous insufficiency (chronic) (peripheral): Secondary | ICD-10-CM | POA: Diagnosis not present

## 2014-01-23 DIAGNOSIS — I480 Paroxysmal atrial fibrillation: Secondary | ICD-10-CM | POA: Diagnosis not present

## 2014-01-23 DIAGNOSIS — I350 Nonrheumatic aortic (valve) stenosis: Secondary | ICD-10-CM | POA: Diagnosis not present

## 2014-01-23 DIAGNOSIS — I4891 Unspecified atrial fibrillation: Secondary | ICD-10-CM | POA: Diagnosis not present

## 2014-01-23 DIAGNOSIS — I10 Essential (primary) hypertension: Secondary | ICD-10-CM | POA: Diagnosis not present

## 2014-01-26 DIAGNOSIS — I4891 Unspecified atrial fibrillation: Secondary | ICD-10-CM | POA: Diagnosis not present

## 2014-01-27 DIAGNOSIS — Z7901 Long term (current) use of anticoagulants: Secondary | ICD-10-CM | POA: Diagnosis not present

## 2014-01-28 ENCOUNTER — Ambulatory Visit (INDEPENDENT_AMBULATORY_CARE_PROVIDER_SITE_OTHER): Payer: Medicare Other | Admitting: Cardiology

## 2014-01-28 ENCOUNTER — Encounter: Payer: Self-pay | Admitting: Cardiology

## 2014-01-28 VITALS — BP 140/80 | HR 62 | Ht 64.0 in | Wt 173.8 lb

## 2014-01-28 DIAGNOSIS — I359 Nonrheumatic aortic valve disorder, unspecified: Secondary | ICD-10-CM | POA: Diagnosis not present

## 2014-01-28 NOTE — Progress Notes (Signed)
HPI The patient presents for followup of aortic stenosis in new onset atrial fibrillation. She was hospitalized recently apparently in [place] with chest pain. She reports ruling out for myocardial infarction. I don't have his workup although I do have a note from Dr. Alfonse Spruce.  She had atrial fibrillation during that presentation. She's had some paroxysms of this documented since then and has been treated with Cardizem beta blockers and warfarin. She is noticing the palpitations occasionally but they are not severe. She's not having any further chest pain. She's not having any presyncope or syncope. She has no new shortness of breath, PND or orthopnea. She's had no weight gain or edema.  Allergies  Allergen Reactions  . Codeine Nausea And Vomiting  . Indomethacin Nausea And Vomiting    Caused chest pain.    Current Outpatient Prescriptions  Medication Sig Dispense Refill  . CALCIUM-VITAMIN D PO Take 1 tablet by mouth daily.    Mikayla [name] Kitchen CARTIA XT 240 MG 24 hr capsule Take 240 mg by mouth daily.     Mikayla [name] Kitchen gabapentin (NEURONTIN) 600 MG tablet Take 600 mg by mouth 3 (three) times daily.     . Hypromellose (GENTEAL) 0.3 % SOLN Apply 1-2 drops to eye daily as needed (for dry eyes).    . meloxicam (MOBIC) 15 MG tablet Take 15 mg by mouth daily.      . metoprolol tartrate (LOPRESSOR) 25 MG tablet Take 25 mg by mouth 2 (two) times daily.     . Multiple Vitamin (MULTIVITAMIN) tablet Take 1 tablet by mouth daily.      . Omega 3 1000 MG CAPS Take 1,000 mg by mouth daily.    Mikayla [name] Kitchen omeprazole (PRILOSEC) 40 MG capsule Take 1 capsule by mouth daily.    Mikayla [name] Kitchen warfarin (COUMADIN) 5 MG tablet Take 5 mg by mouth daily.      No current facility-administered medications for this visit.    Past Medical History  Diagnosis Date  . Acute venous embolism and thrombosis of unspecified deep vessels of lower extremity     after broken foot  . Aortic stenosis     echo 11/11: EF 60%, mild LVH, mod/severe AS with mean 32 mmHg,  peak 55 mmHg, mild AI, mild MR, (previous echo 2010 with mean gradient 29 mmHg)  . Carotid stenosis     dopplers 11/11: 0-39% bilat  . Palpitations     PACs and atrial tachy  . Other congenital anomaly of toes   . Pain in joint, pelvic region and thigh   . Atrial tachycardia   . Dyslipidemia     Past Surgical History  Procedure Laterality Date  . Tonsillectomy    . Radical hysterectomy    . Foot surgery    . Left hip replacement    . Hyroglossal duct cyst resection    . Left and right heart catheterization with coronary angiogram N/A 03/21/2013    Procedure: LEFT AND RIGHT HEART CATHETERIZATION WITH CORONARY ANGIOGRAM;  Surgeon: Minus Breeding, MD;  Location: [facility] CATH LAB;  Service: Cardiovascular;  Laterality: N/A;    ROS:  As stated in the HPI and negative for all other systems.  PHYSICAL EXAM BP 140/80 mmHg  Pulse 62  Ht 5\' 4"  (1.626 m)  Wt 173 lb 12.8 oz (78.835 kg)  BMI 29.82 kg/m2 GENERAL:  Well appearing HEENT:  Pupils equal round and reactive, fundi not visualized, oral mucosa unremarkable,dentures NECK:  No jugular venous distention, waveform within normal limits, carotid upstroke brisk and symmetric,  no bruits, no thyromegaly, transmitted systolic murmur LUNGS:  Clear to auscultation bilaterally BACK:  No CVA tenderness CHEST:  Unremarkable HEART:  PMI not displaced or sustained,S1 and S2 within normal limits, no S3, no S4, no clicks, no rubs, 3/6 mid to late peaking aortic outflow tract murmur with radiation to the carotids. ABD:  Flat, positive bowel sounds normal in frequency in pitch, no bruits, no rebound, no guarding, no midline pulsatile mass, no hepatomegaly, no splenomegaly EXT:  2 plus pulses throughout, no edema, no cyanosis no clubbing  EKG:  Sinus bradycardia, rate 62, left ventricular hypertrophy by voltage criteria, no acute ST-T wave.  No change from previous  01/28/2014  ASSESSMENT AND PLAN  Aortic stenosis -  This was moderate at the time of her  cath.  She has had no new symptoms. I will plan on finding the echocardiogram she had done recently at another hospital. I will follow this clinically.  Atrial fibrillation-  I agree with current therapy expertly prescribed by Dr. Alfonse Spruce. We discussed this at length.  We discussed alternate anticoagulation. Because her valvular disease his aortic stenosis and mitral valvular disease she would be a candidate for NOAC if she prefers. For now she wants to continue warfarin.  Mikayla [name] has a CHA2DS2 - VASc score of 3 with a risk of stroke of 3.21%  and a HAS - BLED score of 1 with a moderate risk of bleeding.

## 2014-01-28 NOTE — Patient Instructions (Signed)
Your physician recommends that you schedule a follow-up appointment in:  4 months with Dr. Percival Spanish in [place]

## 2014-02-04 DIAGNOSIS — I480 Paroxysmal atrial fibrillation: Secondary | ICD-10-CM | POA: Diagnosis not present

## 2014-02-04 DIAGNOSIS — I350 Nonrheumatic aortic (valve) stenosis: Secondary | ICD-10-CM | POA: Diagnosis not present

## 2014-02-04 DIAGNOSIS — Z7901 Long term (current) use of anticoagulants: Secondary | ICD-10-CM | POA: Diagnosis not present

## 2014-02-10 DIAGNOSIS — Z7901 Long term (current) use of anticoagulants: Secondary | ICD-10-CM | POA: Diagnosis not present

## 2014-02-16 ENCOUNTER — Ambulatory Visit: Payer: Medicare Other | Admitting: Cardiology

## 2014-03-03 DIAGNOSIS — Z7901 Long term (current) use of anticoagulants: Secondary | ICD-10-CM | POA: Diagnosis not present

## 2014-03-10 DIAGNOSIS — I10 Essential (primary) hypertension: Secondary | ICD-10-CM | POA: Diagnosis not present

## 2014-03-10 DIAGNOSIS — E669 Obesity, unspecified: Secondary | ICD-10-CM | POA: Diagnosis not present

## 2014-03-10 DIAGNOSIS — I350 Nonrheumatic aortic (valve) stenosis: Secondary | ICD-10-CM | POA: Diagnosis not present

## 2014-03-10 DIAGNOSIS — Z7901 Long term (current) use of anticoagulants: Secondary | ICD-10-CM | POA: Diagnosis not present

## 2014-03-10 DIAGNOSIS — I872 Venous insufficiency (chronic) (peripheral): Secondary | ICD-10-CM | POA: Diagnosis not present

## 2014-03-10 DIAGNOSIS — M1990 Unspecified osteoarthritis, unspecified site: Secondary | ICD-10-CM | POA: Diagnosis not present

## 2014-03-10 DIAGNOSIS — I480 Paroxysmal atrial fibrillation: Secondary | ICD-10-CM | POA: Diagnosis not present

## 2014-03-27 DIAGNOSIS — Z7901 Long term (current) use of anticoagulants: Secondary | ICD-10-CM | POA: Diagnosis not present

## 2014-04-22 DIAGNOSIS — H01006 Unspecified blepharitis left eye, unspecified eyelid: Secondary | ICD-10-CM | POA: Diagnosis not present

## 2014-04-22 DIAGNOSIS — H01003 Unspecified blepharitis right eye, unspecified eyelid: Secondary | ICD-10-CM | POA: Diagnosis not present

## 2014-04-24 DIAGNOSIS — Z7901 Long term (current) use of anticoagulants: Secondary | ICD-10-CM | POA: Diagnosis not present

## 2014-05-22 DIAGNOSIS — Z7901 Long term (current) use of anticoagulants: Secondary | ICD-10-CM | POA: Diagnosis not present

## 2014-05-29 DIAGNOSIS — Z7901 Long term (current) use of anticoagulants: Secondary | ICD-10-CM | POA: Diagnosis not present

## 2014-06-03 ENCOUNTER — Encounter: Payer: Self-pay | Admitting: Cardiology

## 2014-06-03 ENCOUNTER — Ambulatory Visit (INDEPENDENT_AMBULATORY_CARE_PROVIDER_SITE_OTHER): Payer: Medicare Other | Admitting: Cardiology

## 2014-06-03 VITALS — BP 139/72 | HR 59 | Ht 64.0 in | Wt 175.4 lb

## 2014-06-03 DIAGNOSIS — I480 Paroxysmal atrial fibrillation: Secondary | ICD-10-CM | POA: Diagnosis not present

## 2014-06-03 NOTE — Patient Instructions (Signed)
Medication Instructions:  Your physician recommends that you continue on your current medications as directed. Please refer to the Current Medication list given to you today.  Labwork: None  Testing/Procedures: None  Follow-Up: Follow up in 6 months with Dr. Percival Spanish in [place].  You will receive a letter in the mail 2 months before you are due.  Please call us when you receive this letter to schedule your follow up appointment.  Thank you for choosing [facility]!!

## 2014-06-03 NOTE — Progress Notes (Signed)
HPI The patient presents for followup of aortic stenosis and  atrial fibrillation. She was hospitalized last Nov in [place].  She had atrial fib.  Since I last saw her she has not had any further chest pain. She's not having any presyncope or syncope. She has no new shortness of breath, PND or orthopnea. She's had no weight gain or edema.  She has not been particularly active. She is moving to [place].    Allergies  Allergen Reactions  . Codeine Nausea And Vomiting  . Indomethacin Nausea And Vomiting    Caused chest pain.    Current Outpatient Prescriptions  Medication Sig Dispense Refill  . CALCIUM-VITAMIN D PO Take 1 tablet by mouth daily.    Marland Kitchen CARTIA XT 240 MG 24 hr capsule Take 240 mg by mouth daily.     Marland Kitchen gabapentin (NEURONTIN) 600 MG tablet Take 600 mg by mouth 3 (three) times daily.     . meloxicam (MOBIC) 15 MG tablet Take 15 mg by mouth daily.      . metoprolol tartrate (LOPRESSOR) 25 MG tablet Take 25 mg by mouth 2 (two) times daily.     . Multiple Vitamin (MULTIVITAMIN) tablet Take 1 tablet by mouth daily.      . Omega 3 1000 MG CAPS Take 1,000 mg by mouth daily.    Marland Kitchen omeprazole (PRILOSEC) 40 MG capsule Take 1 capsule by mouth daily.    Vladimir Faster Glycol-Propyl Glycol (SYSTANE OP) Apply 1 drop to eye at bedtime.    Marland Kitchen warfarin (COUMADIN) 5 MG tablet Take 5 mg by mouth daily.      No current facility-administered medications for this visit.    Past Medical History  Diagnosis Date  . Acute venous embolism and thrombosis of unspecified deep vessels of lower extremity     after broken foot  . Aortic stenosis     echo 11/11: EF 60%, mild LVH, mod/severe AS with mean 32 mmHg, peak 55 mmHg, mild AI, mild MR, (previous echo 2010 with mean gradient 29 mmHg)  . Carotid stenosis     dopplers 11/11: 0-39% bilat  . Palpitations     PACs and atrial tachy  . Other congenital anomaly of toes   . Pain in joint, pelvic region and thigh   . Atrial tachycardia   .  Dyslipidemia     Past Surgical History  Procedure Laterality Date  . Tonsillectomy    . Radical hysterectomy    . Foot surgery    . Left hip replacement    . Hyroglossal duct cyst resection    . Left and right heart catheterization with coronary angiogram N/A 03/21/2013    Procedure: LEFT AND RIGHT HEART CATHETERIZATION WITH CORONARY ANGIOGRAM;  Surgeon: Minus Breeding, MD;  Location: [facility] CATH LAB;  Service: Cardiovascular;  Laterality: N/A;    ROS:  As stated in the HPI and negative for all other systems.  PHYSICAL EXAM BP 139/72 mmHg  Pulse 59  Ht 5\' 4"  (7.322 m)  Wt 175 lb 6 oz (79.55 kg)  BMI 30.09 kg/m2 GENERAL:  Well appearing HEENT:  Pupils equal round and reactive, fundi not visualized, oral mucosa unremarkable,dentures NECK:  No jugular venous distention, waveform within normal limits, carotid upstroke brisk and symmetric, no bruits, no thyromegaly, transmitted systolic murmur LUNGS:  Clear to auscultation bilaterally BACK:  No CVA tenderness CHEST:  Unremarkable HEART:  PMI not displaced or sustained,S1 and S2 within normal limits, no S3, no S4, no clicks, no  rubs, 3/6 mid to late peaking aortic outflow tract murmur with radiation to the carotids. ABD:  Flat, positive bowel sounds normal in frequency in pitch, no bruits, no rebound, no guarding, no midline pulsatile mass, no hepatomegaly, no splenomegaly EXT:  2 plus pulses throughout, no edema, no cyanosis no clubbing   ASSESSMENT AND PLAN  Aortic stenosis -  This was moderate at the time of her cath in the past and when she was in the hospital last November and echo suggested it was still mild or moderate.  She has no new symptoms. No change in therapy is indicated.  Atrial fibrillation-  I agree with current therapy expertly prescribed by Dr. Alfonse Spruce. We discussed this at length.    Ms. Jayana Kotula has a CHA2DS2 - VASc score of 3 with a risk of stroke of 3.21%  and a HAS - BLED score of 1 with a moderate risk of  bleeding.  Because she is moving [place] I did send a message to primary care to see if we can get her into see an area in particular because she will need to have her INR followed as soon as she moves. I will wait to hear back from them. For now she will continue the meds as listed.

## 2014-06-24 ENCOUNTER — Telehealth: Payer: Self-pay | Admitting: Cardiology

## 2014-06-24 NOTE — Telephone Encounter (Signed)
Pt called in wanting to follow with Dr.Hochrein , she requested that he refer her to a PCP in [facility] and she stated that she had not heard back from him as of yet. She says that he mentions Dr. Randel Pigg and wanted to know if she was willing to except her as a new patient. Please call back  Thanks

## 2014-06-25 NOTE — Telephone Encounter (Signed)
Pt. Informed to call [address] office and ask for Cherre Huger RN and see if she has an Armed forces technical officer about a PCP in [place]

## 2014-06-26 DIAGNOSIS — Z7901 Long term (current) use of anticoagulants: Secondary | ICD-10-CM | POA: Diagnosis not present

## 2014-06-30 ENCOUNTER — Telehealth: Payer: Self-pay | Admitting: Cardiology

## 2014-06-30 NOTE — Telephone Encounter (Signed)
New message      Pt sees Dr Percival Spanish in [place].  She is moving to [place].  On her last visit with Dr Percival Spanish, he gave her the name of a Dr Charlett Blake in the [place] area.  He told her he would send her an email to refer her when she is ready to move.  She is moving in 3-4 weeks and want to know if he will "officially" refer her to this doctor or someone in the group.  Pt is aware Pam is out of office and ok to forward msg to [facility] when she returns

## 2014-07-03 ENCOUNTER — Telehealth: Payer: Self-pay | Admitting: *Deleted

## 2014-07-03 ENCOUNTER — Encounter: Payer: Self-pay | Admitting: *Deleted

## 2014-07-03 NOTE — Telephone Encounter (Signed)
Pre-Visit Call completed with patient and chart updated.   Pre-Visit Info documented in Specialty Comments under [employer].    

## 2014-07-04 NOTE — Telephone Encounter (Signed)
Waiting to hear back from [place].

## 2014-07-04 NOTE — Telephone Encounter (Signed)
Mikayla [name],  This is a lovely patient who is moving to your area.  Would you consider taking her as a new patient.  Thanks.  Maylon Cos

## 2014-07-05 NOTE — Telephone Encounter (Signed)
Good afternoon, she actually has an appt with me next week, we will take good care of her.

## 2014-07-07 ENCOUNTER — Encounter: Payer: Self-pay | Admitting: Family Medicine

## 2014-07-07 ENCOUNTER — Ambulatory Visit (INDEPENDENT_AMBULATORY_CARE_PROVIDER_SITE_OTHER): Payer: Medicare Other | Admitting: Family Medicine

## 2014-07-07 VITALS — BP 122/78 | HR 67 | Temp 98.5°F | Ht 65.0 in | Wt 175.4 lb

## 2014-07-07 DIAGNOSIS — I10 Essential (primary) hypertension: Secondary | ICD-10-CM

## 2014-07-07 DIAGNOSIS — K219 Gastro-esophageal reflux disease without esophagitis: Secondary | ICD-10-CM

## 2014-07-07 DIAGNOSIS — H04123 Dry eye syndrome of bilateral lacrimal glands: Secondary | ICD-10-CM | POA: Insufficient documentation

## 2014-07-07 DIAGNOSIS — E785 Hyperlipidemia, unspecified: Secondary | ICD-10-CM | POA: Diagnosis not present

## 2014-07-07 DIAGNOSIS — D649 Anemia, unspecified: Secondary | ICD-10-CM | POA: Diagnosis not present

## 2014-07-07 DIAGNOSIS — Z8619 Personal history of other infectious and parasitic diseases: Secondary | ICD-10-CM | POA: Insufficient documentation

## 2014-07-07 DIAGNOSIS — I359 Nonrheumatic aortic valve disorder, unspecified: Secondary | ICD-10-CM

## 2014-07-07 NOTE — Progress Notes (Signed)
Pre visit review using our clinic review tool, if applicable. No additional management support is needed unless otherwise documented below in the visit note. 

## 2014-07-07 NOTE — Patient Instructions (Addendum)
Rel of Rec Dr Bonita Quin at [facility] in [place] in [place], Dr Owens Shark, opthamology, [place] for Adults A healthy lifestyle and preventive care can promote health and wellness. Preventive health guidelines for women include the following key practices.  A routine yearly physical is a good way to check with your health care provider about your health and preventive screening. It is a chance to share any concerns and updates on your health and to receive a thorough exam.  Visit your dentist for a routine exam and preventive care every 6 months. Brush your teeth twice a day and floss once a day. Good oral hygiene prevents tooth decay and gum disease.  The frequency of eye exams is based on your age, health, family medical history, use of contact lenses, and other factors. Follow your health care provider's recommendations for frequency of eye exams.  Eat a healthy diet. Foods like vegetables, fruits, whole grains, low-fat dairy products, and lean protein foods contain the nutrients you need without too many calories. Decrease your intake of foods high in solid fats, added sugars, and salt. Eat the right amount of calories for you.Get information about a proper diet from your health care provider, if necessary.  Regular physical exercise is one of the most important things you can do for your health. Most adults should get at least 150 minutes of moderate-intensity exercise (any activity that increases your heart rate and causes you to sweat) each week. In addition, most adults need muscle-strengthening exercises on 2 or more days a week.  Maintain a healthy weight. The body mass index (BMI) is a screening tool to identify possible weight problems. It provides an estimate of body fat based on height and weight. Your health care provider can find your BMI and can help you achieve or maintain a healthy weight.For adults 20 years and  older:  A BMI below 18.5 is considered underweight.  A BMI of 18.5 to 24.9 is normal.  A BMI of 25 to 29.9 is considered overweight.  A BMI of 30 and above is considered obese.  Maintain normal blood lipids and cholesterol levels by exercising and minimizing your intake of saturated fat. Eat a balanced diet with plenty of fruit and vegetables. Blood tests for lipids and cholesterol should begin at age 24 and be repeated every 5 years. If your lipid or cholesterol levels are high, you are over 50, or you are at high risk for heart disease, you may need your cholesterol levels checked more frequently.Ongoing high lipid and cholesterol levels should be treated with medicines if diet and exercise are not working.  If you smoke, find out from your health care provider how to quit. If you do not use tobacco, do not start.  Lung cancer screening is recommended for adults aged 43-80 years who are at high risk for developing lung cancer because of a history of smoking. A yearly low-dose CT scan of the lungs is recommended for people who have at least a 30-pack-year history of smoking and are a current smoker or have quit within the past 15 years. A pack year of smoking is smoking an average of 1 pack of cigarettes a day for 1 year (for example: 1 pack a day for 30 years or 2 packs a day for 15 years). Yearly screening should continue until the smoker has stopped smoking for at least 15 years. Yearly screening should be stopped for  people who develop a health problem that would prevent them from having lung cancer treatment.  If you are pregnant, do not drink alcohol. If you are breastfeeding, be very cautious about drinking alcohol. If you are not pregnant and choose to drink alcohol, do not have more than 1 drink per day. One drink is considered to be 12 ounces (355 mL) of beer, 5 ounces (148 mL) of wine, or 1.5 ounces (44 mL) of liquor.  Avoid use of street drugs. Do not share needles with anyone. Ask  for help if you need support or instructions about stopping the use of drugs.  High blood pressure causes heart disease and increases the risk of stroke. Your blood pressure should be checked at least every 1 to 2 years. Ongoing high blood pressure should be treated with medicines if weight loss and exercise do not work.  If you are 83-86 years old, ask your health care provider if you should take aspirin to prevent strokes.  Diabetes screening involves taking a blood sample to check your fasting blood sugar level. This should be done once every 3 years, after age 50, if you are within normal weight and without risk factors for diabetes. Testing should be considered at a younger age or be carried out more frequently if you are overweight and have at least 1 risk factor for diabetes.  Breast cancer screening is essential preventive care for women. You should practice "breast self-awareness." This means understanding the normal appearance and feel of your breasts and may include breast self-examination. Any changes detected, no matter how small, should be reported to a health care provider. Women in their 31s and 30s should have a clinical breast exam (CBE) by a health care provider as part of a regular health exam every 1 to 3 years. After age 51, women should have a CBE every year. Starting at age 3, women should consider having a mammogram (breast X-ray test) every year. Women who have a family history of breast cancer should talk to their health care provider about genetic screening. Women at a high risk of breast cancer should talk to their health care providers about having an MRI and a mammogram every year.  Breast cancer gene (BRCA)-related cancer risk assessment is recommended for women who have family members with BRCA-related cancers. BRCA-related cancers include breast, ovarian, tubal, and peritoneal cancers. Having family members with these cancers may be associated with an increased risk for  harmful changes (mutations) in the breast cancer genes BRCA1 and BRCA2. Results of the assessment will determine the need for genetic counseling and BRCA1 and BRCA2 testing.  Routine pelvic exams to screen for cancer are no longer recommended for nonpregnant women who are considered low risk for cancer of the pelvic organs (ovaries, uterus, and vagina) and who do not have symptoms. Ask your health care provider if a screening pelvic exam is right for you.  If you have had past treatment for cervical cancer or a condition that could lead to cancer, you need Pap tests and screening for cancer for at least 20 years after your treatment. If Pap tests have been discontinued, your risk factors (such as having a new sexual partner) need to be reassessed to determine if screening should be resumed. Some women have medical problems that increase the chance of getting cervical cancer. In these cases, your health care provider may recommend more frequent screening and Pap tests.  The HPV test is an additional test that may be used for  cervical cancer screening. The HPV test looks for the virus that can cause the cell changes on the cervix. The cells collected during the Pap test can be tested for HPV. The HPV test could be used to screen women aged 33 years and older, and should be used in women of any age who have unclear Pap test results. After the age of 67, women should have HPV testing at the same frequency as a Pap test.  Colorectal cancer can be detected and often prevented. Most routine colorectal cancer screening begins at the age of 67 years and continues through age 62 years. However, your health care provider may recommend screening at an earlier age if you have risk factors for colon cancer. On a yearly basis, your health care provider may provide home test kits to check for hidden blood in the stool. Use of a small camera at the end of a tube, to directly examine the colon (sigmoidoscopy or colonoscopy),  can detect the earliest forms of colorectal cancer. Talk to your health care provider about this at age 23, when routine screening begins. Direct exam of the colon should be repeated every 5-10 years through age 4 years, unless early forms of pre-cancerous polyps or small growths are found.  People who are at an increased risk for hepatitis B should be screened for this virus. You are considered at high risk for hepatitis B if:  You were born in a country where hepatitis B occurs often. Talk with your health care provider about which countries are considered high risk.  Your parents were born in a high-risk country and you have not received a shot to protect against hepatitis B (hepatitis B vaccine).  You have HIV or AIDS.  You use needles to inject street drugs.  You live with, or have sex with, someone who has hepatitis B.  You get hemodialysis treatment.  You take certain medicines for conditions like cancer, organ transplantation, and autoimmune conditions.  Hepatitis C blood testing is recommended for all people born from [birth year] through 1965 and any individual with known risks for hepatitis C.  Practice safe sex. Use condoms and avoid high-risk sexual practices to reduce the spread of sexually transmitted infections (STIs). STIs include gonorrhea, chlamydia, syphilis, trichomonas, herpes, HPV, and human immunodeficiency virus (HIV). Herpes, HIV, and HPV are viral illnesses that have no cure. They can result in disability, cancer, and death.  You should be screened for sexually transmitted illnesses (STIs) including gonorrhea and chlamydia if:  You are sexually active and are younger than 24 years.  You are older than 24 years and your health care provider tells you that you are at risk for this type of infection.  Your sexual activity has changed since you were last screened and you are at an increased risk for chlamydia or gonorrhea. Ask your health care provider if you are at  risk.  If you are at risk of being infected with HIV, it is recommended that you take a prescription medicine daily to prevent HIV infection. This is called preexposure prophylaxis (PrEP). You are considered at risk if:  You are a heterosexual woman, are sexually active, and are at increased risk for HIV infection.  You take drugs by injection.  You are sexually active with a partner who has HIV.  Talk with your health care provider about whether you are at high risk of being infected with HIV. If you choose to begin PrEP, you should first be tested for HIV. You  should then be tested every 3 months for as long as you are taking PrEP.  Osteoporosis is a disease in which the bones lose minerals and strength with aging. This can result in serious bone fractures or breaks. The risk of osteoporosis can be identified using a bone density scan. Women ages 87 years and over and women at risk for fractures or osteoporosis should discuss screening with their health care providers. Ask your health care provider whether you should take a calcium supplement or vitamin D to reduce the rate of osteoporosis.  Menopause can be associated with physical symptoms and risks. Hormone replacement therapy is available to decrease symptoms and risks. You should talk to your health care provider about whether hormone replacement therapy is right for you.  Use sunscreen. Apply sunscreen liberally and repeatedly throughout the day. You should seek shade when your shadow is shorter than you. Protect yourself by wearing long sleeves, pants, a wide-brimmed hat, and sunglasses year round, whenever you are outdoors.  Once a month, do a whole body skin exam, using a mirror to look at the skin on your back. Tell your health care provider of new moles, moles that have irregular borders, moles that are larger than a pencil eraser, or moles that have changed in shape or color.  Stay current with required vaccines  (immunizations).  Influenza vaccine. All adults should be immunized every year.  Tetanus, diphtheria, and acellular pertussis (Td, Tdap) vaccine. Pregnant women should receive 1 dose of Tdap vaccine during each pregnancy. The dose should be obtained regardless of the length of time since the last dose. Immunization is preferred during the 27th-36th week of gestation. An adult who has not previously received Tdap or who does not know her vaccine status should receive 1 dose of Tdap. This initial dose should be followed by tetanus and diphtheria toxoids (Td) booster doses every 10 years. Adults with an unknown or incomplete history of completing a 3-dose immunization series with Td-containing vaccines should begin or complete a primary immunization series including a Tdap dose. Adults should receive a Td booster every 10 years.  Varicella vaccine. An adult without evidence of immunity to varicella should receive 2 doses or a second dose if she has previously received 1 dose. Pregnant females who do not have evidence of immunity should receive the first dose after pregnancy. This first dose should be obtained before leaving the health care facility. The second dose should be obtained 4-8 weeks after the first dose.  Human papillomavirus (HPV) vaccine. Females aged 13-26 years who have not received the vaccine previously should obtain the 3-dose series. The vaccine is not recommended for use in pregnant females. However, pregnancy testing is not needed before receiving a dose. If a female is found to be pregnant after receiving a dose, no treatment is needed. In that case, the remaining doses should be delayed until after the pregnancy. Immunization is recommended for any person with an immunocompromised condition through the age of 16 years if she did not get any or all doses earlier. During the 3-dose series, the second dose should be obtained 4-8 weeks after the first dose. The third dose should be obtained  24 weeks after the first dose and 16 weeks after the second dose.  Zoster vaccine. One dose is recommended for adults aged 81 years or older unless certain conditions are present.  Measles, mumps, and rubella (MMR) vaccine. Adults born before 2 generally are considered immune to measles and mumps. Adults born in  1957 or later should have 1 or more doses of MMR vaccine unless there is a contraindication to the vaccine or there is laboratory evidence of immunity to each of the three diseases. A routine second dose of MMR vaccine should be obtained at least 28 days after the first dose for students attending postsecondary schools, health care workers, or international travelers. People who received inactivated measles vaccine or an unknown type of measles vaccine during 1963-1967 should receive 2 doses of MMR vaccine. People who received inactivated mumps vaccine or an unknown type of mumps vaccine before 1979 and are at high risk for mumps infection should consider immunization with 2 doses of MMR vaccine. For females of childbearing age, rubella immunity should be determined. If there is no evidence of immunity, females who are not pregnant should be vaccinated. If there is no evidence of immunity, females who are pregnant should delay immunization until after pregnancy. Unvaccinated health care workers born before [birth year] who lack laboratory evidence of measles, mumps, or rubella immunity or laboratory confirmation of disease should consider measles and mumps immunization with 2 doses of MMR vaccine or rubella immunization with 1 dose of MMR vaccine.  Pneumococcal 13-valent conjugate (PCV13) vaccine. When indicated, a person who is uncertain of her immunization history and has no record of immunization should receive the PCV13 vaccine. An adult aged 74 years or older who has certain medical conditions and has not been previously immunized should receive 1 dose of PCV13 vaccine. This PCV13 should be followed  with a dose of pneumococcal polysaccharide (PPSV23) vaccine. The PPSV23 vaccine dose should be obtained at least 8 weeks after the dose of PCV13 vaccine. An adult aged 36 years or older who has certain medical conditions and previously received 1 or more doses of PPSV23 vaccine should receive 1 dose of PCV13. The PCV13 vaccine dose should be obtained 1 or more years after the last PPSV23 vaccine dose.  Pneumococcal polysaccharide (PPSV23) vaccine. When PCV13 is also indicated, PCV13 should be obtained first. All adults aged 73 years and older should be immunized. An adult younger than age 27 years who has certain medical conditions should be immunized. Any person who resides in a nursing home or long-term care facility should be immunized. An adult smoker should be immunized. People with an immunocompromised condition and certain other conditions should receive both PCV13 and PPSV23 vaccines. People with human immunodeficiency virus (HIV) infection should be immunized as soon as possible after diagnosis. Immunization during chemotherapy or radiation therapy should be avoided. Routine use of PPSV23 vaccine is not recommended for American Indians, [place] Natives, or people younger than 65 years unless there are medical conditions that require PPSV23 vaccine. When indicated, people who have unknown immunization and have no record of immunization should receive PPSV23 vaccine. One-time revaccination 5 years after the first dose of PPSV23 is recommended for people aged 19-64 years who have chronic kidney failure, nephrotic syndrome, asplenia, or immunocompromised conditions. People who received 1-2 doses of PPSV23 before age 79 years should receive another dose of PPSV23 vaccine at age 63 years or later if at least 5 years have passed since the previous dose. Doses of PPSV23 are not needed for people immunized with PPSV23 at or after age 1 years.  Meningococcal vaccine. Adults with asplenia or persistent complement  component deficiencies should receive 2 doses of quadrivalent meningococcal conjugate (MenACWY-D) vaccine. The doses should be obtained at least 2 months apart. Microbiologists working with certain meningococcal bacteria, [employer] recruits, people at risk  during an outbreak, and people who travel to or live in countries with a high rate of meningitis should be immunized. A first-year college student up through age 44 years who is living in a residence hall should receive a dose if she did not receive a dose on or after her 16th birthday. Adults who have certain high-risk conditions should receive one or more doses of vaccine.  Hepatitis A vaccine. Adults who wish to be protected from this disease, have certain high-risk conditions, work with hepatitis A-infected animals, work in hepatitis A research labs, or travel to or work in countries with a high rate of hepatitis A should be immunized. Adults who were previously unvaccinated and who anticipate close contact with an international adoptee during the first 60 days after arrival in the [place] from a country with a high rate of hepatitis A should be immunized.  Hepatitis B vaccine. Adults who wish to be protected from this disease, have certain high-risk conditions, may be exposed to blood or other infectious body fluids, are household contacts or sex partners of hepatitis B positive people, are clients or workers in certain care facilities, or travel to or work in countries with a high rate of hepatitis B should be immunized.  Haemophilus influenzae type b (Hib) vaccine. A previously unvaccinated person with asplenia or sickle cell disease or having a scheduled splenectomy should receive 1 dose of Hib vaccine. Regardless of previous immunization, a recipient of a hematopoietic stem cell transplant should receive a 3-dose series 6-12 months after her successful transplant. Hib vaccine is not recommended for adults with HIV infection. Preventive  Services / Frequency Ages 47 to 69 years  Blood pressure check.** / Every 1 to 2 years.  Lipid and cholesterol check.** / Every 5 years beginning at age 90.  Clinical breast exam.** / Every 3 years for women in their 76s and 80s.  BRCA-related cancer risk assessment.** / For women who have family members with a BRCA-related cancer (breast, ovarian, tubal, or peritoneal cancers).  Pap test.** / Every 2 years from ages 39 through 72. Every 3 years starting at age 40 through age 73 or 76 with a history of 3 consecutive normal Pap tests.  HPV screening.** / Every 3 years from ages 68 through ages 30 to 52 with a history of 3 consecutive normal Pap tests.  Hepatitis C blood test.** / For any individual with known risks for hepatitis C.  Skin self-exam. / Monthly.  Influenza vaccine. / Every year.  Tetanus, diphtheria, and acellular pertussis (Tdap, Td) vaccine.** / Consult your health care provider. Pregnant women should receive 1 dose of Tdap vaccine during each pregnancy. 1 dose of Td every 10 years.  Varicella vaccine.** / Consult your health care provider. Pregnant females who do not have evidence of immunity should receive the first dose after pregnancy.  HPV vaccine. / 3 doses over 6 months, if 65 and younger. The vaccine is not recommended for use in pregnant females. However, pregnancy testing is not needed before receiving a dose.  Measles, mumps, rubella (MMR) vaccine.** / You need at least 1 dose of MMR if you were born in 1957 or later. You may also need a 2nd dose. For females of childbearing age, rubella immunity should be determined. If there is no evidence of immunity, females who are not pregnant should be vaccinated. If there is no evidence of immunity, females who are pregnant should delay immunization until after pregnancy.  Pneumococcal 13-valent conjugate (PCV13) vaccine.** /  Consult your health care provider.  Pneumococcal polysaccharide (PPSV23) vaccine.** / 1 to 2  doses if you smoke cigarettes or if you have certain conditions.  Meningococcal vaccine.** / 1 dose if you are age 42 to 56 years and a Market researcher living in a residence hall, or have one of several medical conditions, you need to get vaccinated against meningococcal disease. You may also need additional booster doses.  Hepatitis A vaccine.** / Consult your health care provider.  Hepatitis B vaccine.** / Consult your health care provider.  Haemophilus influenzae type b (Hib) vaccine.** / Consult your health care provider. Ages 84 to 82 years  Blood pressure check.** / Every 1 to 2 years.  Lipid and cholesterol check.** / Every 5 years beginning at age 39 years.  Lung cancer screening. / Every year if you are aged 30-80 years and have a 30-pack-year history of smoking and currently smoke or have quit within the past 15 years. Yearly screening is stopped once you have quit smoking for at least 15 years or develop a health problem that would prevent you from having lung cancer treatment.  Clinical breast exam.** / Every year after age 35 years.  BRCA-related cancer risk assessment.** / For women who have family members with a BRCA-related cancer (breast, ovarian, tubal, or peritoneal cancers).  Mammogram.** / Every year beginning at age 26 years and continuing for as long as you are in good health. Consult with your health care provider.  Pap test.** / Every 3 years starting at age 43 years through age 34 or 84 years with a history of 3 consecutive normal Pap tests.  HPV screening.** / Every 3 years from ages 61 years through ages 37 to 7 years with a history of 3 consecutive normal Pap tests.  Fecal occult blood test (FOBT) of stool. / Every year beginning at age 35 years and continuing until age 37 years. You may not need to do this test if you get a colonoscopy every 10 years.  Flexible sigmoidoscopy or colonoscopy.** / Every 5 years for a flexible sigmoidoscopy or every  10 years for a colonoscopy beginning at age 14 years and continuing until age 33 years.  Hepatitis C blood test.** / For all people born from [birth year] through 1967 and any individual with known risks for hepatitis C.  Skin self-exam. / Monthly.  Influenza vaccine. / Every year.  Tetanus, diphtheria, and acellular pertussis (Tdap/Td) vaccine.** / Consult your health care provider. Pregnant women should receive 1 dose of Tdap vaccine during each pregnancy. 1 dose of Td every 10 years.  Varicella vaccine.** / Consult your health care provider. Pregnant females who do not have evidence of immunity should receive the first dose after pregnancy.  Zoster vaccine.** / 1 dose for adults aged 76 years or older.  Measles, mumps, rubella (MMR) vaccine.** / You need at least 1 dose of MMR if you were born in 1959 or later. You may also need a 2nd dose. For females of childbearing age, rubella immunity should be determined. If there is no evidence of immunity, females who are not pregnant should be vaccinated. If there is no evidence of immunity, females who are pregnant should delay immunization until after pregnancy.  Pneumococcal 13-valent conjugate (PCV13) vaccine.** / Consult your health care provider.  Pneumococcal polysaccharide (PPSV23) vaccine.** / 1 to 2 doses if you smoke cigarettes or if you have certain conditions.  Meningococcal vaccine.** / Consult your health care provider.  Hepatitis A vaccine.** / Consult  your health care provider.  Hepatitis B vaccine.** / Consult your health care provider.  Haemophilus influenzae type b (Hib) vaccine.** / Consult your health care provider. Ages 71 years and over  Blood pressure check.** / Every 1 to 2 years.  Lipid and cholesterol check.** / Every 5 years beginning at age 31 years.  Lung cancer screening. / Every year if you are aged 26-80 years and have a 30-pack-year history of smoking and currently smoke or have quit within the past 15 years.  Yearly screening is stopped once you have quit smoking for at least 15 years or develop a health problem that would prevent you from having lung cancer treatment.  Clinical breast exam.** / Every year after age 47 years.  BRCA-related cancer risk assessment.** / For women who have family members with a BRCA-related cancer (breast, ovarian, tubal, or peritoneal cancers).  Mammogram.** / Every year beginning at age 57 years and continuing for as long as you are in good health. Consult with your health care provider.  Pap test.** / Every 3 years starting at age 78 years through age 75 or 40 years with 3 consecutive normal Pap tests. Testing can be stopped between 65 and 70 years with 3 consecutive normal Pap tests and no abnormal Pap or HPV tests in the past 10 years.  HPV screening.** / Every 3 years from ages 57 years through ages 50 or 78 years with a history of 3 consecutive normal Pap tests. Testing can be stopped between 65 and 70 years with 3 consecutive normal Pap tests and no abnormal Pap or HPV tests in the past 10 years.  Fecal occult blood test (FOBT) of stool. / Every year beginning at age 40 years and continuing until age 27 years. You may not need to do this test if you get a colonoscopy every 10 years.  Flexible sigmoidoscopy or colonoscopy.** / Every 5 years for a flexible sigmoidoscopy or every 10 years for a colonoscopy beginning at age 73 years and continuing until age 55 years.  Hepatitis C blood test.** / For all people born from [birth year] through 1967 and any individual with known risks for hepatitis C.  Osteoporosis screening.** / A one-time screening for women ages 65 years and over and women at risk for fractures or osteoporosis.  Skin self-exam. / Monthly.  Influenza vaccine. / Every year.  Tetanus, diphtheria, and acellular pertussis (Tdap/Td) vaccine.** / 1 dose of Td every 10 years.  Varicella vaccine.** / Consult your health care provider.  Zoster vaccine.** / 1  dose for adults aged 62 years or older.  Pneumococcal 13-valent conjugate (PCV13) vaccine.** / Consult your health care provider.  Pneumococcal polysaccharide (PPSV23) vaccine.** / 1 dose for all adults aged 90 years and older.  Meningococcal vaccine.** / Consult your health care provider.  Hepatitis A vaccine.** / Consult your health care provider.  Hepatitis B vaccine.** / Consult your health care provider.  Haemophilus influenzae type b (Hib) vaccine.** / Consult your health care provider. ** Family history and personal history of risk and conditions may change your health care provider's recommendations. Document Released: 03/21/2001 Document Revised: 06/09/2013 Document Reviewed: 06/20/2010 [facility] Patient Information 2015 [place], [place]. This information is not intended to replace advice given to you by your health care provider. Make sure you discuss any questions you have with your health care provider.

## 2014-07-19 NOTE — Assessment & Plan Note (Signed)
Avoid offending foods, start probiotics. Do not eat large meals in late evening and consider raising head of bed.  

## 2014-07-19 NOTE — Assessment & Plan Note (Signed)
Well controlled, no changes to meds. Encouraged heart healthy diet such as the DASH diet and exercise as tolerated.  °

## 2014-07-19 NOTE — Assessment & Plan Note (Signed)
Follows closely with cardiology, Dr Percival Spanish, doing well

## 2014-07-19 NOTE — Progress Notes (Signed)
Mikayla [name]  [id number] 1940/08/23 07/19/2014      Progress Note-Follow Up  Subjective  Chief Complaint  Chief Complaint  Patient presents with  . Establish Care    HPI  Patient is a 74 y.o. female in today for routine medical care. Patient in today to establish care. She feels well. She has a past medical history significant for aortic valve disorder, hypertension, anemia, reflux, arthritis, DVT. She feels at her baseline today. No recent illness. No acute concerns. Denies CP/palp/SOB/HA/congestion/fevers/GI or GU c/o. Taking meds as prescribed  Past Medical History  Diagnosis Date  . Acute venous embolism and thrombosis of unspecified deep vessels of lower extremity     after broken foot screw left in place, left leg  . Aortic stenosis     echo 11/11: EF 60%, mild LVH, mod/severe AS with mean 32 mmHg, peak 55 mmHg, mild AI, mild MR, (previous echo 2010 with mean gradient 29 mmHg)  . Carotid stenosis     dopplers 11/11: 0-39% bilat  . Palpitations     PACs and atrial tachy  . Other congenital anomaly of toes   . Pain in joint, pelvic region and thigh   . Atrial tachycardia   . Dyslipidemia   . Anemia   . GERD (gastroesophageal reflux disease)   . Bilateral dry eyes   . Hyperlipidemia   . History of chicken pox   . H/O measles     Past Surgical History  Procedure Laterality Date  . Tonsillectomy    . Radical hysterectomy    . Foot surgery    . Left hip replacement    . Hyroglossal duct cyst resection    . Left and right heart catheterization with coronary angiogram N/A 03/21/2013    Procedure: LEFT AND RIGHT HEART CATHETERIZATION WITH CORONARY ANGIOGRAM;  Surgeon: Minus Breeding, MD;  Location: [facility] C[facility] LAB;  Service: Cardiovascular;  Laterality: N/A;  . Cholecystectomy      Family History  Problem Relation Age of Onset  . Heart disease Mother   . Hypertension Mother 21  . Heart disease Father 37  . Cancer Maternal Aunt     Breast  . Heart disease Maternal  Grandmother   . Heart disease Maternal Grandfather   . Hernia Daughter   . Gallstones Daughter     History   Social History  . Marital Status: Divorced    Spouse Name: N/A  . Number of Children: N/A  . Years of Education: N/A   Occupational History  . Not on file.   Social History Main Topics  . Smoking status: Never Smoker   . Smokeless tobacco: Not on file  . Alcohol Use: No  . Drug Use: No  . 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 Not on file   Social History Narrative    Current Outpatient Prescriptions on File Prior to Visit  Medication Sig Dispense Refill  . acetaminophen (TYLENOL) 500 MG tablet Take 1 tablet by mouth as needed.    Marland Kitchen CALCIUM-VITAMIN D PO Take 1 tablet by mouth daily.    Marland Kitchen CARTIA XT 240 MG 24 hr capsule Take 240 mg by mouth daily.     Marland Kitchen gabapentin (NEURONTIN) 600 MG tablet Take 600 mg by mouth 3 (three) times daily.     . meloxicam (MOBIC) 15 MG tablet Take 15 mg by mouth daily.      Marland Kitchen  metoprolol tartrate (LOPRESSOR) 25 MG tablet Take 25 mg by mouth 2 (two) times daily.     . Multiple Vitamin (MULTIVITAMIN) tablet Take 1 tablet by mouth daily.      . multivitamin-lutein (OCUVITE-LUTEIN) CAPS capsule Take 1 capsule by mouth daily.    . Omega 3 1000 MG CAPS Take 1,000 mg by mouth daily.    Marland Kitchen omeprazole (PRILOSEC) 40 MG capsule Take 1 capsule by mouth daily.    Vladimir Faster Glycol-Propyl Glycol (SYSTANE OP) Apply 1 drop to eye at bedtime.    Marland Kitchen warfarin (COUMADIN) 5 MG tablet Take 5 mg by mouth daily. Takes 2.5mg  M, W, F and other days 5mg      No current facility-administered medications on file prior to visit.    Allergies  Allergen Reactions  . Codeine Nausea And Vomiting  . Indomethacin Nausea And Vomiting    Caused chest pain.    Review of Systems  Review of Systems  Constitutional: Negative for fever and malaise/fatigue.  HENT: Negative for congestion.    Eyes: Negative for discharge.  Respiratory: Negative for shortness of breath.   Cardiovascular: Negative for chest pain, palpitations and leg swelling.  Gastrointestinal: Negative for nausea, abdominal pain and diarrhea.  Genitourinary: Negative for dysuria.  Musculoskeletal: Negative for falls.  Skin: Negative for rash.  Neurological: Negative for loss of consciousness and headaches.  Endo/Heme/Allergies: Negative for polydipsia.  Psychiatric/Behavioral: Negative for depression and suicidal ideas. The patient is not nervous/anxious and does not have insomnia.     Objective  BP 122/78 mmHg  Pulse 67  Temp(Src) 98.5 F (36.9 C) (Oral)  Ht 5\' 5"  (1.651 m)  Wt 175 lb 6 oz (79.55 kg)  BMI 29.18 kg/m2  SpO2 95%  Physical Exam  Physical Exam  Constitutional: She is oriented to person, place, and time and well-developed, well-nourished, and in no distress. No distress.  HENT:  Head: Normocephalic and atraumatic.  Eyes: Conjunctivae and EOM are normal. Pupils are equal, round, and reactive to light. Right eye exhibits no discharge. Left eye exhibits no discharge. No scleral icterus.  Neck: Normal range of motion. Neck supple. No thyromegaly present.  Cardiovascular: Normal rate, regular rhythm and normal heart sounds.   No murmur heard. Pulmonary/Chest: Effort normal and breath sounds normal. She has no wheezes.  Abdominal: Soft. Bowel sounds are normal. She exhibits no distension and no mass.  Musculoskeletal: She exhibits no edema.  Lymphadenopathy:    She has no cervical adenopathy.  Neurological: She is alert and oriented to person, place, and time.  Skin: Skin is warm and dry. No rash noted. She is not diaphoretic.  Psychiatric: Memory, affect and judgment normal.    No results found for: TSH Lab Results  Component Value Date   WBC 6.9 03/21/2013   HGB 12.8 03/21/2013   HCT 39.0 03/21/2013   MCV 92.4 03/21/2013   PLT 206 03/21/2013   Lab Results  Component Value  Date   CREATININE 0.58 03/21/2013   BUN 17 03/21/2013   NA 143 03/21/2013   K 4.3 03/21/2013   CL 106 03/21/2013   CO2 27 03/21/2013   No results found for: ALT, AST, GGT, ALKPHOS, BILITOT No results found for: CHOL No results found for: HDL No results found for: LDLCALC No results found for: TRIG No results found for: CHOLHDL   Assessment & Plan  No problem-specific assessment & plan notes found for this encounter.

## 2014-07-19 NOTE — Assessment & Plan Note (Signed)
Encouraged heart healthy diet, increase exercise, avoid trans fats, consider a krill oil cap daily 

## 2014-07-19 NOTE — Assessment & Plan Note (Signed)
Increase leafy greens, consider increased lean red meat and using cast iron cookware. Continue to monitor, report any concerns 

## 2014-07-24 DIAGNOSIS — Z7901 Long term (current) use of anticoagulants: Secondary | ICD-10-CM | POA: Diagnosis not present

## 2014-07-31 DIAGNOSIS — Z7901 Long term (current) use of anticoagulants: Secondary | ICD-10-CM | POA: Diagnosis not present

## 2014-08-11 ENCOUNTER — Other Ambulatory Visit: Payer: Self-pay | Admitting: Family Medicine

## 2014-08-11 MED ORDER — WARFARIN SODIUM 5 MG PO TABS: 5.0000 mg | ORAL_TABLET | Freq: Every day | ORAL | Status: DC

## 2014-08-14 ENCOUNTER — Ambulatory Visit (INDEPENDENT_AMBULATORY_CARE_PROVIDER_SITE_OTHER): Payer: Medicare Other | Admitting: *Deleted

## 2014-08-14 DIAGNOSIS — I82409 Acute embolism and thrombosis of unspecified deep veins of unspecified lower extremity: Secondary | ICD-10-CM

## 2014-08-14 DIAGNOSIS — Z86718 Personal history of other venous thrombosis and embolism: Secondary | ICD-10-CM | POA: Diagnosis not present

## 2014-08-14 LAB — POCT INR: INR: 2.8

## 2014-08-14 NOTE — Progress Notes (Signed)
Pre visit review using our clinic review tool, if applicable. No additional management support is needed unless otherwise documented below in the visit note. 

## 2014-08-14 NOTE — Patient Instructions (Signed)
Continue to take 1/2 tablet on MWF and all other days 1 tablet. Recheck in 4 weeks.

## 2014-08-25 ENCOUNTER — Ambulatory Visit (HOSPITAL_BASED_OUTPATIENT_CLINIC_OR_DEPARTMENT_OTHER)
Admission: RE | Admit: 2014-08-25 | Discharge: 2014-08-25 | Disposition: A | Discharge status: Home / Self Care | Payer: Medicare Other | Source: Ambulatory Visit | Attending: Family Medicine | Admitting: Family Medicine

## 2014-08-25 ENCOUNTER — Ambulatory Visit: Payer: Medicare Other

## 2014-08-25 ENCOUNTER — Encounter: Payer: Self-pay | Admitting: Family Medicine

## 2014-08-25 ENCOUNTER — Ambulatory Visit (INDEPENDENT_AMBULATORY_CARE_PROVIDER_SITE_OTHER): Payer: Medicare Other | Admitting: Family Medicine

## 2014-08-25 VITALS — BP 118/80 | HR 75 | Temp 98.9°F | Ht 64.0 in | Wt 176.0 lb

## 2014-08-25 DIAGNOSIS — R8290 Unspecified abnormal findings in urine: Secondary | ICD-10-CM | POA: Diagnosis not present

## 2014-08-25 DIAGNOSIS — E785 Hyperlipidemia, unspecified: Secondary | ICD-10-CM

## 2014-08-25 DIAGNOSIS — R609 Edema, unspecified: Secondary | ICD-10-CM | POA: Diagnosis not present

## 2014-08-25 DIAGNOSIS — M1990 Unspecified osteoarthritis, unspecified site: Secondary | ICD-10-CM | POA: Insufficient documentation

## 2014-08-25 DIAGNOSIS — R0602 Shortness of breath: Secondary | ICD-10-CM

## 2014-08-25 DIAGNOSIS — M25551 Pain in right hip: Secondary | ICD-10-CM

## 2014-08-25 DIAGNOSIS — R002 Palpitations: Secondary | ICD-10-CM

## 2014-08-25 DIAGNOSIS — K219 Gastro-esophageal reflux disease without esophagitis: Secondary | ICD-10-CM | POA: Diagnosis not present

## 2014-08-25 DIAGNOSIS — I10 Essential (primary) hypertension: Secondary | ICD-10-CM

## 2014-08-25 DIAGNOSIS — R350 Frequency of micturition: Secondary | ICD-10-CM

## 2014-08-25 DIAGNOSIS — R109 Unspecified abdominal pain: Secondary | ICD-10-CM

## 2014-08-25 DIAGNOSIS — I872 Venous insufficiency (chronic) (peripheral): Secondary | ICD-10-CM

## 2014-08-25 DIAGNOSIS — E860 Dehydration: Secondary | ICD-10-CM | POA: Insufficient documentation

## 2014-08-25 DIAGNOSIS — M1611 Unilateral primary osteoarthritis, right hip: Secondary | ICD-10-CM | POA: Diagnosis not present

## 2014-08-25 DIAGNOSIS — R8299 Other abnormal findings in urine: Secondary | ICD-10-CM | POA: Diagnosis not present

## 2014-08-25 LAB — CBC
HCT: 38.2 % (ref 36.0–46.0)
Hemoglobin: 12.8 g/dL (ref 12.0–15.0)
MCHC: 33.5 g/dL (ref 30.0–36.0)
MCV: 92.3 fl (ref 78.0–100.0)
Platelets: 209 10*3/uL (ref 150.0–400.0)
RBC: 4.14 Mil/uL (ref 3.87–5.11)
RDW: 14.4 % (ref 11.5–15.5)
WBC: 6.9 10*3/uL (ref 4.0–10.5)

## 2014-08-25 LAB — COMPREHENSIVE METABOLIC PANEL
ALT: 17 U/L (ref 0–35)
AST: 17 U/L (ref 0–37)
Albumin: 3.9 g/dL (ref 3.5–5.2)
Alkaline Phosphatase: 97 U/L (ref 39–117)
BILIRUBIN TOTAL: 0.5 mg/dL (ref 0.2–1.2)
BUN: 26 mg/dL — ABNORMAL HIGH (ref 6–23)
CALCIUM: 9.3 mg/dL (ref 8.4–10.5)
CO2: 30 mEq/L (ref 19–32)
Chloride: 105 mEq/L (ref 96–112)
Creatinine, Ser: 0.66 mg/dL (ref 0.40–1.20)
GFR: 92.58 mL/min (ref >60.00)
Glucose, Bld: 93 mg/dL (ref 70–99)
Potassium: 4.3 mEq/L (ref 3.5–5.1)
SODIUM: 142 meq/L (ref 135–145)
TOTAL PROTEIN: 6.5 g/dL (ref 6.0–8.3)

## 2014-08-25 LAB — URINALYSIS, ROUTINE W REFLEX MICROSCOPIC
Bilirubin Urine: NEGATIVE
Hgb urine dipstick: NEGATIVE
Ketones, ur: NEGATIVE
NITRITE: POSITIVE — AB
RBC / HPF: NONE SEEN (ref 0–?)
Specific Gravity, Urine: >=1.03 — AB (ref 1.000–1.030)
Total Protein, Urine: NEGATIVE
UROBILINOGEN UA: 1 (ref 0.0–1.0)
Urine Glucose: NEGATIVE
pH: 5.5 (ref 5.0–8.0)

## 2014-08-25 LAB — TSH: TSH: 2.23 u[IU]/mL (ref 0.35–4.50)

## 2014-08-25 MED ORDER — HYOSCYAMINE SULFATE 0.125 MG SL SUBL: 0.1250 mg | SUBLINGUAL_TABLET | SUBLINGUAL | Status: DC | PRN

## 2014-08-25 MED ORDER — FUROSEMIDE 20 MG PO TABS: 20.0000 mg | ORAL_TABLET | Freq: Every day | ORAL | Status: DC | PRN

## 2014-08-25 NOTE — Assessment & Plan Note (Signed)
Well controlled, no changes to meds. Encouraged heart healthy diet such as the DASH diet and exercise as tolerated.  °

## 2014-08-25 NOTE — Patient Instructions (Addendum)
Jobst lightweight knee highs can be Producer, television/film/video.  Salon Pas patches/gel with tylenol (500 mg tablets 2-3 times a day) over painful areas after 10-15 minutes of ice therapy.  Start a probiotic such as Dig[employer] or [employer] or online at [employer] by [place] has a 10 strain version 1 cap daily  64 oz of clear fluids daily, caffeine works against [place]   [place] stands for "Dietary Approaches to Stop Hypertension." The DASH eating plan is a healthy eating plan that has been shown to reduce high blood pressure (hypertension). Additional health benefits may include reducing the risk of type 2 diabetes mellitus, heart disease,  and stroke. The DASH eating plan may also help with weight loss. WHAT DO I NEED TO KNOW ABOUT THE DASH EATING PLAN? For the DASH eating plan, you will follow these general guidelines:  Choose foods with a percent daily value for sodium of less than 5% (as listed on the food label).  Use salt-free seasonings or herbs instead of table salt or sea salt.  Check with your health care provider or pharmacist before using salt substitutes.  Eat lower-sodium products, often labeled as "lower sodium" or "no salt added."  Eat fresh foods.  Eat more vegetables, fruits, and low-fat dairy products.  Choose whole grains. Look for the word "whole" as the first word in the ingredient list.  Choose fish and skinless chicken or [place] more often than red meat. Limit fish, poultry, and meat to 6 oz (170 g) each day.  Limit sweets, desserts, sugars, and sugary drinks.  Choose heart-healthy fats.  Limit cheese to 1 oz (28 g) per day.  Eat more home-cooked food and less restaurant, buffet, and fast food.  Limit fried foods.  Cook foods using methods other than frying.  Limit canned vegetables. If you do use them, rinse them well to decrease the sodium.  When eating at a restaurant, ask that your food be prepared  with less salt, or no salt if possible. WHAT FOODS CAN I EAT? Seek help from a dietitian for individual calorie needs. Grains Whole grain or whole wheat bread. Brown rice. Whole grain or whole wheat pasta. Quinoa, bulgur, and whole grain cereals. Low-sodium cereals. Corn or whole wheat flour tortillas. Whole grain cornbread. Whole grain crackers. Low-sodium crackers. Vegetables Fresh or frozen vegetables (raw, steamed, roasted, or grilled). Low-sodium or reduced-sodium tomato and vegetable juices. Low-sodium or reduced-sodium tomato sauce and paste. Low-sodium or reduced-sodium canned vegetables.  Fruits All fresh, canned (in natural juice), or frozen fruits. Meat and Other Protein Products Ground beef (85% or leaner), grass-fed beef, or beef trimmed of fat. Skinless chicken or [place]. Ground chicken or [place]. Pork trimmed of fat. All fish and seafood. Eggs. Dried beans, peas, or lentils. Unsalted nuts and seeds. Unsalted canned beans. Dairy Low-fat dairy products, such as skim or 1% milk, 2% or reduced-fat cheeses, low-fat ricotta or cottage cheese, or plain low-fat yogurt. Low-sodium or reduced-sodium cheeses. Fats and Oils Tub margarines without trans fats. Light or reduced-fat mayonnaise and salad dressings (reduced sodium). Avocado. Safflower, olive, or canola oils. Natural peanut or almond butter. Other Unsalted popcorn and pretzels. The items listed above may not be a complete list of recommended foods or beverages. Contact your dietitian for more options. WHAT FOODS ARE NOT RECOMMENDED? Grains White bread. White pasta. White rice. Refined cornbread. Bagels and croissants. Crackers that contain trans fat. Vegetables Creamed or fried vegetables. Vegetables in a cheese sauce.  Regular canned vegetables. Regular canned tomato sauce and paste. Regular tomato and vegetable juices. Fruits Dried fruits. Canned fruit in light or heavy syrup. Fruit juice. Meat and Other Protein Products Fatty  cuts of meat. Ribs, chicken wings, bacon, sausage, bologna, salami, chitterlings, fatback, hot dogs, bratwurst, and packaged luncheon meats. Salted nuts and seeds. Canned beans with salt. Dairy Whole or 2% milk, cream, half-and-half, and cream cheese. Whole-fat or sweetened yogurt. Full-fat cheeses or blue cheese. Nondairy creamers and whipped toppings. Processed cheese, cheese spreads, or cheese curds. Condiments Onion and garlic salt, seasoned salt, table salt, and sea salt. Canned and packaged gravies. Worcestershire sauce. Tartar sauce. Barbecue sauce. Teriyaki sauce. Soy sauce, including reduced sodium. Steak sauce. Fish sauce. Oyster sauce. Cocktail sauce. Horseradish. Ketchup and mustard. Meat flavorings and tenderizers. Bouillon cubes. Hot sauce. Tabasco sauce. Marinades. Taco seasonings. Relishes. Fats and Oils Butter, stick margarine, lard, shortening, ghee, and bacon fat. Coconut, palm kernel, or palm oils. Regular salad dressings. Other Pickles and olives. Salted popcorn and pretzels. The items listed above may not be a complete list of foods and beverages to avoid. Contact your dietitian for more information. WHERE CAN I FIND MORE INFORMATION? National Heart, Lung, and Blood Institute: [employer]stabl[employer].com Document Released: 01/12/2011 Document Revised: 06/09/2013 Document Reviewed: 11/27/2012 [facility] Patient Information 2015 [place], [place]. This information is not intended to replace advice given to you by your health care provider. Make sure you discuss any questions you have with your health care provider.

## 2014-08-25 NOTE — Assessment & Plan Note (Signed)
Long history of intermittent, infrequent short lived palpitations, no recent flare. Encouraged to minimize caffeine report worsening symptoms

## 2014-08-25 NOTE — Assessment & Plan Note (Signed)
H/o left hip replacement now with increased right hip pain intermittently. May use Salon Pas patches and tylenol as directed but is advised to stop Meloxicam due to coumadin use. Proceed with Hip xray and consider sports med or ortho referral if patient requests she declines today

## 2014-08-25 NOTE — Assessment & Plan Note (Signed)
Encouraged to avoid sodium follow DASH diet Elevate feet above heart several times daily, try compression hose and given Lasix to use prn.

## 2014-08-25 NOTE — Assessment & Plan Note (Signed)
Encouraged heart healthy diet, increase exercise, avoid trans fats, consider a krill oil cap daily. Declines check til annual exam in fall

## 2014-08-25 NOTE — Assessment & Plan Note (Signed)
With malodorous urine and ongoing back pain check a UA and C and S. Also some gas pains in upper abdomen noted intemittently, relieved with belching. Start a probiotic and given Hyoscyamine to use prn

## 2014-08-25 NOTE — Progress Notes (Signed)
Pre visit review using our clinic review tool, if applicable. No additional management support is needed unless otherwise documented below in the visit note. 

## 2014-08-25 NOTE — Assessment & Plan Note (Signed)
Drinking very little as she settles into her new home. Encouraged 64 oz of clear fluids and cut down on caffeine. This should help her dizziness

## 2014-08-25 NOTE — Assessment & Plan Note (Signed)
Long standing, stable

## 2014-08-25 NOTE — Assessment & Plan Note (Signed)
Avoid offending foods, start probiotics. Do not eat large meals in late evening and consider raising head of bed.  

## 2014-08-25 NOTE — Progress Notes (Signed)
Mikayla [name]  [id number] 23-Jan-1941 08/25/2014      Progress Note-Follow Up  Subjective  Chief Complaint  Chief Complaint  Patient presents with  . Edema    feet and ankles    HPI  Patient is a 74 y.o. female in today for routine medical care. Patient is in today with numerous complaints. She has been moving recently and acknowledges she hasn't been eating well, hydrating well or getting her feet up. She has b/l pedal edema which is not resolving when she lies down at light like it used to. No calf pain. No chest pain. She notes intermittent right hip pain as well she has had this in the past but it is happening more frequently. No radicular symptoms. She is also noting a long history of intermittent palpitations these are not worsening but are occurring from time to time. She is having episodes of dizziness upon standing quickly feeling mostly lightheaded. No syncope. No presyncope. Has intermittent shortness of breath but this is also long-standing and ongoing and occurs randomly. Final complaint is malodorous urine. She has some intermittent abdominal discomfort and back pain as well. She reports abdominal pain often will relieved with burping.  Past Medical History  Diagnosis Date  . Acute venous embolism and thrombosis of unspecified deep vessels of lower extremity     after broken foot screw left in place, left leg  . Aortic stenosis     echo 11/11: EF 60%, mild LVH, mod/severe AS with mean 32 mmHg, peak 55 mmHg, mild AI, mild MR, (previous echo 2010 with mean gradient 29 mmHg)  . Carotid stenosis     dopplers 11/11: 0-39% bilat  . Palpitations     PACs and atrial tachy  . Other congenital anomaly of toes   . Pain in joint, pelvic region and thigh   . Atrial tachycardia   . Dyslipidemia   . Anemia   . GERD (gastroesophageal reflux disease)   . Bilateral dry eyes   . Hyperlipidemia   . History of chicken pox   . H/O measles     Past Surgical History  Procedure  Laterality Date  . Tonsillectomy    . Radical hysterectomy    . Foot surgery    . Left hip replacement    . Hyroglossal duct cyst resection    . Left and right heart catheterization with coronary angiogram N/A 03/21/2013    Procedure: LEFT AND RIGHT HEART CATHETERIZATION WITH CORONARY ANGIOGRAM;  Surgeon: Minus Breeding, MD;  Location: [facility] CATH LAB;  Service: Cardiovascular;  Laterality: N/A;  . Cholecystectomy      Family History  Problem Relation Age of Onset  . Heart disease Mother   . Hypertension Mother 13  . Heart disease Father 27  . Cancer Maternal Aunt     Breast  . Heart disease Maternal Grandmother   . Heart disease Maternal Grandfather   . Hernia Daughter   . Gallstones Daughter     History   Social History  . Marital Status: Divorced    Spouse Name: N/A  . Number of Children: N/A  . Years of Education: N/A   Occupational History  . Not on file.   Social History Main Topics  . Smoking status: Never Smoker   . Smokeless tobacco: Not on file  . Alcohol Use: No  . Drug Use: No  . 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 Not on file   Social History Narrative    Current Outpatient Prescriptions on File Prior to Visit  Medication Sig Dispense Refill  . acetaminophen (TYLENOL) 500 MG tablet Take 1 tablet by mouth as needed.    Marland Kitchen CALCIUM-VITAMIN D PO Take 1 tablet by mouth daily.    Marland Kitchen CARTIA XT 240 MG 24 hr capsule Take 240 mg by mouth daily.     Marland Kitchen gabapentin (NEURONTIN) 600 MG tablet Take 600 mg by mouth 3 (three) times daily.     . meloxicam (MOBIC) 15 MG tablet Take 15 mg by mouth daily.      . metoprolol tartrate (LOPRESSOR) 25 MG tablet Take 25 mg by mouth 2 (two) times daily.     . Multiple Vitamin (MULTIVITAMIN) tablet Take 1 tablet by mouth daily.      . multivitamin-lutein (OCUVITE-LUTEIN) CAPS capsule Take 1 capsule by mouth daily.    . Omega 3 1000 MG CAPS Take  1,000 mg by mouth daily.    Marland Kitchen omeprazole (PRILOSEC) 40 MG capsule Take 1 capsule by mouth daily.    Vladimir Faster Glycol-Propyl Glycol (SYSTANE OP) Apply 1 drop to eye 2 (two) times daily.     Marland Kitchen warfarin (COUMADIN) 5 MG tablet Take 1 tablet (5 mg total) by mouth daily. Takes 2.5mg  M, W, F and other days 5mg  30 tablet 0   No current facility-administered medications on file prior to visit.    Allergies  Allergen Reactions  . Codeine Nausea And Vomiting  . Indomethacin Nausea And Vomiting    Caused chest pain.    Review of Systems  Review of Systems  Constitutional: Positive for malaise/fatigue. Negative for fever.  HENT: Negative for congestion.   Eyes: Negative for discharge.  Respiratory: Positive for shortness of breath.   Cardiovascular: Positive for palpitations and leg swelling. Negative for chest pain.  Gastrointestinal: Positive for abdominal pain. Negative for heartburn, nausea and diarrhea.  Genitourinary: Negative for dysuria, frequency and hematuria.  Musculoskeletal: Negative for falls.  Skin: Negative for rash.  Neurological: Negative for loss of consciousness and headaches.  Endo/Heme/Allergies: Negative for polydipsia.  Psychiatric/Behavioral: Negative for depression and suicidal ideas. The patient is not nervous/anxious and does not have insomnia.     Objective  BP 118/80 mmHg  Pulse 75  Temp(Src) 98.9 F (37.2 C) (Oral)  Ht 5\' 4"  (1.626 m)  Wt 176 lb (79.833 kg)  BMI 30.20 kg/m2  SpO2 95%  Physical Exam  Physical Exam  Constitutional: She is oriented to person, place, and time and well-developed, well-nourished, and in no distress. No distress.  HENT:  Head: Normocephalic and atraumatic.  Eyes: Conjunctivae are normal.  Neck: Neck supple. No thyromegaly present.  Cardiovascular: Normal rate and regular rhythm.   Murmur heard. Pulmonary/Chest: Effort normal and breath sounds normal. She has no wheezes.  Abdominal: She exhibits no distension and no  mass.  Musculoskeletal: She exhibits edema.  2 + pedal edema b/l, negative Homan's  Lymphadenopathy:    She has no cervical adenopathy.  Neurological: She is alert and oriented to person, place, and time.  Skin: Skin is warm and dry. No rash noted. She is not diaphoretic.  Psychiatric: Memory, affect and judgment normal.    No results found for: TSH Lab Results  Component Value Date   WBC 6.9 03/21/2013   HGB 12.8 03/21/2013   HCT 39.0 03/21/2013   MCV 92.4 03/21/2013   PLT 206 03/21/2013   Lab Results  Component Value Date   CREATININE 0.58 03/21/2013   BUN 17 03/21/2013   NA 143 03/21/2013   K 4.3 03/21/2013   CL 106 03/21/2013   CO2 27 03/21/2013     Assessment & Plan  GERD (gastroesophageal reflux disease) Avoid offending foods, start probiotics. Do not eat large meals in late evening and consider raising head of bed.   Benign essential HTN Well controlled, no changes to meds. Encouraged heart healthy diet such as the DASH diet and exercise as tolerated.   HIP PAIN H/o left hip replacement now with increased right hip pain intermittently. May use Salon Pas patches and tylenol as directed but is advised to stop Meloxicam due to coumadin use. Proceed with Hip xray and consider sports med or ortho referral if patient requests she declines today  Palpitations Long history of intermittent, infrequent short lived palpitations, no recent flare. Encouraged to minimize caffeine report worsening symptoms  Shortness of breath Long standing, stable  Hyperlipidemia Encouraged heart healthy diet, increase exercise, avoid trans fats, consider a krill oil cap daily. Declines check til annual exam in fall  Chronic venous insufficiency Check CMP today  Abdominal pain With malodorous urine and ongoing back pain check a UA and C and S. Also some gas pains in upper abdomen noted intemittently, relieved with belching. Start a probiotic and given Hyoscyamine to use  prn  Peripheral edema Encouraged to avoid sodium follow DASH diet Elevate feet above heart several times daily, try compression hose and given Lasix to use prn.  Dehydration Drinking very little as she settles into her new home. Encouraged 64 oz of clear fluids and cut down on caffeine. This should help her dizziness

## 2014-08-25 NOTE — Assessment & Plan Note (Signed)
Check CMP today 

## 2014-08-28 LAB — URINE CULTURE

## 2014-09-11 ENCOUNTER — Ambulatory Visit (INDEPENDENT_AMBULATORY_CARE_PROVIDER_SITE_OTHER): Payer: Medicare Other | Admitting: *Deleted

## 2014-09-11 DIAGNOSIS — Z86718 Personal history of other venous thrombosis and embolism: Secondary | ICD-10-CM | POA: Diagnosis not present

## 2014-09-11 DIAGNOSIS — I82409 Acute embolism and thrombosis of unspecified deep veins of unspecified lower extremity: Secondary | ICD-10-CM

## 2014-09-11 LAB — POCT INR: INR: 2.3

## 2014-09-11 NOTE — Progress Notes (Signed)
Pre visit review using our clinic review tool, if applicable. No additional management support is needed unless otherwise documented below in the visit note. 

## 2014-09-11 NOTE — Patient Instructions (Signed)
Continue to take 1/2 tablet on MWF and all other days 1 tablet. Recheck in 4 weeks.

## 2014-10-08 ENCOUNTER — Encounter: Payer: Self-pay | Admitting: Family Medicine

## 2014-10-08 ENCOUNTER — Ambulatory Visit (INDEPENDENT_AMBULATORY_CARE_PROVIDER_SITE_OTHER): Payer: Medicare Other | Admitting: Family Medicine

## 2014-10-08 VITALS — BP 122/80 | HR 61 | Temp 98.3°F | Ht 65.0 in | Wt 171.4 lb

## 2014-10-08 DIAGNOSIS — M1990 Unspecified osteoarthritis, unspecified site: Secondary | ICD-10-CM

## 2014-10-08 DIAGNOSIS — E785 Hyperlipidemia, unspecified: Secondary | ICD-10-CM | POA: Diagnosis not present

## 2014-10-08 DIAGNOSIS — Z780 Asymptomatic menopausal state: Secondary | ICD-10-CM

## 2014-10-08 DIAGNOSIS — K219 Gastro-esophageal reflux disease without esophagitis: Secondary | ICD-10-CM

## 2014-10-08 DIAGNOSIS — Z23 Encounter for immunization: Secondary | ICD-10-CM

## 2014-10-08 DIAGNOSIS — M8580 Other specified disorders of bone density and structure, unspecified site: Secondary | ICD-10-CM | POA: Diagnosis not present

## 2014-10-08 DIAGNOSIS — I10 Essential (primary) hypertension: Secondary | ICD-10-CM

## 2014-10-08 DIAGNOSIS — Z7901 Long term (current) use of anticoagulants: Secondary | ICD-10-CM | POA: Diagnosis not present

## 2014-10-08 LAB — POCT INR: INR: 1.9

## 2014-10-08 MED ORDER — GABAPENTIN 800 MG PO TABS: 800.0000 mg | ORAL_TABLET | Freq: Three times a day (TID) | ORAL | Status: DC

## 2014-10-08 MED ORDER — WARFARIN SODIUM 5 MG PO TABS: 5.0000 mg | ORAL_TABLET | Freq: Every day | ORAL | Status: DC

## 2014-10-08 NOTE — Patient Instructions (Signed)
Tylenol/Acetaminophen 500 mg tabs up to 3 x a day as needed Osteoarthritis Osteoarthritis is a disease that causes soreness and inflammation of a joint. It occurs when the cartilage at the affected joint wears down. Cartilage acts as a cushion, covering the ends of bones where they meet to form a joint. Osteoarthritis is the most common form of arthritis. It often occurs in older people. The joints affected most often by this condition include those in the:  Ends of the fingers.  Thumbs.  Neck.  Lower back.    Knees.  Hips. CAUSES  Over time, the cartilage that covers the ends of bones begins to wear away. This causes bone to rub on bone, producing pain and stiffness in the affected joints.  RISK FACTORS Certain factors can increase your chances of having osteoarthritis, including:  Older age.  Excessive body weight.  Overuse of joints.  Previous joint injury. SIGNS AND SYMPTOMS   Pain, swelling, and stiffness in the joint.  Over time, the joint may lose its normal shape.  Small deposits of bone (osteophytes) may grow on the edges of the joint.  Bits of bone or cartilage can break off and float inside the joint space. This may cause more pain and damage. DIAGNOSIS  Your health care provider will do a physical exam and ask about your symptoms. Various tests may be ordered, such as:  X-rays of the affected joint.  An MRI scan.  Blood tests to rule out other types of arthritis.  Joint fluid tests. This involves using a needle to draw fluid from the joint and examining the fluid under a microscope. TREATMENT  Goals of treatment are to control pain and improve joint function. Treatment plans may include:  A prescribed exercise program that allows for rest and joint relief.  A weight control plan.  Pain relief techniques, such as:  Properly applied heat and cold.  Electric pulses delivered to nerve endings under the skin (transcutaneous electrical nerve  stimulation [TENS]).  Massage.  Certain nutritional supplements.  Medicines to control pain, such as:  Acetaminophen.  Nonsteroidal anti-inflammatory drugs (NSAIDs), such as naproxen.  Narcotic or central-acting agents, such as tramadol.  Corticosteroids. These can be given orally or as an injection.  Surgery to reposition the bones and relieve pain (osteotomy) or to remove loose pieces of bone and cartilage. Joint replacement may be needed in advanced states of osteoarthritis. HOME CARE INSTRUCTIONS   Take medicines only as directed by your health care provider.  Maintain a healthy weight. Follow your health care provider's instructions for weight control. This may include dietary instructions.  Exercise as directed. Your health care provider can recommend specific types of exercise. These may include:  Strengthening exercises. These are done to strengthen the muscles that support joints affected by arthritis. They can be performed with weights or with exercise bands to add resistance.  Aerobic activities. These are exercises, such as brisk walking or low-impact aerobics, that get your heart pumping.  Range-of-motion activities. These keep your joints limber.  Balance and agility exercises. These help you maintain daily living skills.  Rest your affected joints as directed by your health care provider.  Keep all follow-up visits as directed by your health care provider. SEEK MEDICAL CARE IF:   Your skin turns red.  You develop a rash in addition to your joint pain.  You have worsening joint pain.  You have a fever along with joint or muscle aches. SEEK IMMEDIATE MEDICAL CARE IF:  You have  a significant loss of weight or appetite.  You have night sweats. [place] of Arthritis and Musculoskeletal and Skin Diseases: www.niams.SouthExposed.es  [employer] on Aging: http://kim-miller.com/  American College of Rheumatology:  www.rheumatology.org Document Released: 01/23/2005 Document Revised: 06/09/2013 Document Reviewed: 09/30/2012 [facility] Patient Information 2015 [place], [place]. This information is not intended to replace advice given to you by your health care provider. Make sure you discuss any questions you have with your health care provider.

## 2014-10-08 NOTE — Assessment & Plan Note (Signed)
Repeat dexa scan °

## 2014-10-08 NOTE — Progress Notes (Signed)
Pre visit review using our clinic review tool, if applicable. No additional management support is needed unless otherwise documented below in the visit note. 

## 2014-10-09 ENCOUNTER — Ambulatory Visit: Payer: Medicare Other

## 2014-10-25 NOTE — Assessment & Plan Note (Signed)
Avoid offending foods, start probiotics. Do not eat large meals in late evening and consider raising head of bed.  

## 2014-10-25 NOTE — Assessment & Plan Note (Signed)
Encouraged heart healthy diet, increase exercise, avoid trans fats, consider a krill oil cap daily 

## 2014-10-25 NOTE — Assessment & Plan Note (Signed)
Well controlled, no changes to meds. Encouraged heart healthy diet such as the DASH diet and exercise as tolerated.  °

## 2014-10-25 NOTE — Progress Notes (Signed)
Subjective:    Patient ID: Mikayla [name], female    DOB: 1940/04/04, 74 y.o.   MRN: [id number]  Chief Complaint  Patient presents with  . Follow-up    HPI Patient is in today for follow-up. Overall feeling well. Biggest complaint today is arthritic pain. Pain is diffuse but no redness swelling or recent injury. Wakes up stiff and in pain morning. Does get some improvement with Tylenol. To meloxicam as suggested. Has stopped Lasix as edema has resolved.. No recent illness. Denies CP/palp/SOB/HA/congestion/fevers/GI or GU c/o. Taking meds as prescribed  Past Medical History  Diagnosis Date  . Acute venous embolism and thrombosis of unspecified deep vessels of lower extremity     after broken foot screw left in place, left leg  . Aortic stenosis     echo 11/11: EF 60%, mild LVH, mod/severe AS with mean 32 mmHg, peak 55 mmHg, mild AI, mild MR, (previous echo 2010 with mean gradient 29 mmHg)  . Carotid stenosis     dopplers 11/11: 0-39% bilat  . Palpitations     PACs and atrial tachy  . Other congenital anomaly of toes   . Pain in joint, pelvic region and thigh   . Atrial tachycardia   . Dyslipidemia   . Anemia   . GERD (gastroesophageal reflux disease)   . Bilateral dry eyes   . Hyperlipidemia   . History of chicken pox   . H/O measles   . Abdominal pain 08/25/2014  . Peripheral edema 08/25/2014  . Dehydration 08/25/2014  . Hyperlipidemia, mild 10/08/2014  . Osteopenia 10/08/2014    Past Surgical History  Procedure Laterality Date  . Tonsillectomy    . Radical hysterectomy    . Foot surgery    . Left hip replacement    . Hyroglossal duct cyst resection    . Left and right heart catheterization with coronary angiogram N/A 03/21/2013    Procedure: LEFT AND RIGHT HEART CATHETERIZATION WITH CORONARY ANGIOGRAM;  Surgeon: Minus Breeding, MD;  Location: [facility] CATH LAB;  Service: Cardiovascular;  Laterality: N/A;  . Cholecystectomy      Family History  Problem Relation Age of Onset  .  Heart disease Mother   . Hypertension Mother 64  . Heart disease Father 87  . Cancer Maternal Aunt     Breast  . Heart disease Maternal Grandmother   . Heart disease Maternal Grandfather   . Hernia Daughter   . Gallstones Daughter     Social History   Social History  . Marital Status: Divorced    Spouse Name: N/A  . Number of Children: N/A  . Years of Education: N/A   Occupational History  . Not on file.   Social History Main Topics  . Smoking status: Never Smoker   . Smokeless tobacco: Not on file  . Alcohol Use: No  . Drug Use: No  . 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 Not on file   Social History Narrative    Outpatient Prescriptions Prior to Visit  Medication Sig Dispense Refill  . CALCIUM-VITAMIN D PO Take 1 tablet by mouth daily.    Marland Kitchen CARTIA XT 240 MG 24 hr capsule Take 240 mg by mouth daily.     Marland Kitchen gabapentin (NEURONTIN) 600 MG tablet Take 600 mg by mouth 3 (three) times daily.     . hyoscyamine (LEVSIN SL) 0.125 MG SL tablet  Place 1 tablet (0.125 mg total) under the tongue every 4 (four) hours as needed. 30 tablet 0  . metoprolol tartrate (LOPRESSOR) 25 MG tablet Take 25 mg by mouth 2 (two) times daily.     . Multiple Vitamin (MULTIVITAMIN) tablet Take 1 tablet by mouth daily.      . Omega 3 1000 MG CAPS Take 1,000 mg by mouth daily.    Marland Kitchen omeprazole (PRILOSEC) 40 MG capsule Take 1 capsule by mouth daily.    Vladimir Faster Glycol-Propyl Glycol (SYSTANE OP) Apply 1 drop to eye 2 (two) times daily.     . multivitamin-lutein (OCUVITE-LUTEIN) CAPS capsule Take 1 capsule by mouth daily.    Marland Kitchen warfarin (COUMADIN) 5 MG tablet Take 1 tablet (5 mg total) by mouth daily. Takes 2.5mg  M, W, F and other days 5mg  30 tablet 0  . acetaminophen (TYLENOL) 500 MG tablet Take 1 tablet by mouth as needed.    . furosemide (LASIX) 20 MG tablet Take 1 tablet (20 mg total) by mouth daily as needed for  edema. (Patient not taking: Reported on 10/08/2014) 30 tablet 1  . meloxicam (MOBIC) 15 MG tablet Take 15 mg by mouth daily.       No facility-administered medications prior to visit.    Allergies  Allergen Reactions  . Codeine Nausea And Vomiting  . Indomethacin Nausea And Vomiting    Caused chest pain.    Review of Systems  Constitutional: Positive for malaise/fatigue. Negative for fever.  HENT: Negative for congestion.   Eyes: Negative for discharge.  Respiratory: Negative for shortness of breath.   Cardiovascular: Negative for chest pain, palpitations and leg swelling.  Gastrointestinal: Negative for nausea and abdominal pain.  Genitourinary: Negative for dysuria.  Musculoskeletal: Positive for joint pain and neck pain. Negative for falls.  Skin: Negative for rash.  Neurological: Negative for loss of consciousness and headaches.  Endo/Heme/Allergies: Negative for environmental allergies.  Psychiatric/Behavioral: Negative for depression. The patient is not nervous/anxious.        Objective:    Physical Exam  Constitutional: She is oriented to person, place, and time. She appears well-developed and well-nourished. No distress.  HENT:  Head: Normocephalic and atraumatic.  Nose: Nose normal.  Eyes: Right eye exhibits no discharge. Left eye exhibits no discharge.  Neck: Normal range of motion. Neck supple.  Cardiovascular: Normal rate.   Pulmonary/Chest: Effort normal and breath sounds normal.  Abdominal: Soft. Bowel sounds are normal. There is no tenderness.  Musculoskeletal: She exhibits no edema.  Neurological: She is alert and oriented to person, place, and time.  Skin: Skin is warm and dry.  Psychiatric: She has a normal mood and affect.  Nursing note and vitals reviewed.   BP 122/80 mmHg  Pulse 61  Temp(Src) 98.3 F (36.8 C) (Oral)  Ht 5\' 5"  (1.651 m)  Wt 171 lb 6 oz (77.735 kg)  BMI 28.52 kg/m2  SpO2 96% Wt Readings from Last 3 Encounters:  10/08/14 171 lb  6 oz (77.735 kg)  08/25/14 176 lb (79.833 kg)  07/07/14 175 lb 6 oz (79.55 kg)     Lab Results  Component Value Date   WBC 6.9 08/25/2014   HGB 12.8 08/25/2014   HCT 38.2 08/25/2014   PLT 209.0 08/25/2014   GLUCOSE 93 08/25/2014   ALT 17 08/25/2014   AST 17 08/25/2014   NA 142 08/25/2014   K 4.3 08/25/2014   CL 105 08/25/2014   CREATININE 0.66 08/25/2014   BUN 26* 08/25/2014  CO2 30 08/25/2014   TSH 2.23 08/25/2014   INR 1.9 10/08/2014    Lab Results  Component Value Date   TSH 2.23 08/25/2014   Lab Results  Component Value Date   WBC 6.9 08/25/2014   HGB 12.8 08/25/2014   HCT 38.2 08/25/2014   MCV 92.3 08/25/2014   PLT 209.0 08/25/2014   Lab Results  Component Value Date   NA 142 08/25/2014   K 4.3 08/25/2014   CO2 30 08/25/2014   GLUCOSE 93 08/25/2014   BUN 26* 08/25/2014   CREATININE 0.66 08/25/2014   BILITOT 0.5 08/25/2014   ALKPHOS 97 08/25/2014   AST 17 08/25/2014   ALT 17 08/25/2014   PROT 6.5 08/25/2014   ALBUMIN 3.9 08/25/2014   CALCIUM 9.3 08/25/2014   GFR 92.58 08/25/2014   No results found for: CHOL No results found for: HDL No results found for: LDLCALC No results found for: TRIG No results found for: CHOLHDL No results found for: HGBA1C     Assessment & Plan:   Problem List Items Addressed This Visit    Osteopenia    Repeat dexa scan      Relevant Orders   DG Bone Density   Vit D  25 hydroxy (rtn osteoporosis monitoring)   CBC   TSH   Lipid panel   Comprehensive metabolic panel   Hyperlipidemia, mild    Encouraged heart healthy diet, increase exercise, avoid trans fats, consider a krill oil cap daily      Relevant Medications   warfarin (COUMADIN) 5 MG tablet   Other Relevant Orders   Vit D  25 hydroxy (rtn osteoporosis monitoring)   CBC   TSH   Lipid panel   Comprehensive metabolic panel   GERD (gastroesophageal reflux disease)    Avoid offending foods, start probiotics. Do not eat large meals in late evening and  consider raising head of bed.       Benign essential HTN    Well controlled, no changes to meds. Encouraged heart healthy diet such as the DASH diet and exercise as tolerated.       Relevant Medications   warfarin (COUMADIN) 5 MG tablet   Arthritis, degenerative    May use Tylenol up to tid, 1000 mg, stay as active as possible       Other Visit Diagnoses    Need for influenza vaccination    -  Primary    Relevant Orders    Flu vaccine HIGH DOSE PF (Fluzone High dose) (Completed)    Vit D  25 hydroxy (rtn osteoporosis monitoring)    CBC    TSH    Lipid panel    Comprehensive metabolic panel    Long term current use of anticoagulant        Relevant Orders    POCT INR (Completed)    Vit D  25 hydroxy (rtn osteoporosis monitoring)    CBC    TSH    Lipid panel    Comprehensive metabolic panel    Postmenopausal estrogen deficiency        Relevant Orders    DG Bone Density    Vit D  25 hydroxy (rtn osteoporosis monitoring)    CBC    TSH    Lipid panel    Comprehensive metabolic panel    Essential hypertension        Relevant Medications    warfarin (COUMADIN) 5 MG tablet    Other Relevant Orders    Vit D  25 hydroxy (rtn osteoporosis monitoring)    CBC    TSH    Lipid panel    Comprehensive metabolic panel       I have discontinued Ms. Rodden's multivitamin-lutein. I am also having her start on gabapentin. Additionally, I am having her maintain her gabapentin, meloxicam, multivitamin, omeprazole, CALCIUM-VITAMIN D PO, Omega 3, CARTIA XT, metoprolol tartrate, Polyethyl Glycol-Propyl Glycol (SYSTANE OP), acetaminophen, hyoscyamine, furosemide, and warfarin.  Meds ordered this encounter  Medications  . warfarin (COUMADIN) 5 MG tablet    Sig: Take 1 tablet (5 mg total) by mouth daily. Takes 2.5mg  M, W, F and other days 5mg     Dispense:  30 tablet    Refill:  4  . gabapentin (NEURONTIN) 800 MG tablet    Sig: Take 1 tablet (800 mg total) by mouth 3 (three) times daily.      Dispense:  270 tablet    Refill:  1     Penni Homans, MD

## 2014-10-25 NOTE — Assessment & Plan Note (Signed)
May use Tylenol up to tid, 1000 mg, stay as active as possible

## 2014-11-11 ENCOUNTER — Ambulatory Visit (HOSPITAL_BASED_OUTPATIENT_CLINIC_OR_DEPARTMENT_OTHER)
Admission: RE | Admit: 2014-11-11 | Discharge: 2014-11-11 | Disposition: A | Discharge status: Home / Self Care | Payer: Medicare Other | Source: Ambulatory Visit | Attending: Family Medicine | Admitting: Family Medicine

## 2014-11-11 ENCOUNTER — Telehealth: Payer: Self-pay

## 2014-11-11 ENCOUNTER — Ambulatory Visit (INDEPENDENT_AMBULATORY_CARE_PROVIDER_SITE_OTHER): Payer: Medicare Other

## 2014-11-11 DIAGNOSIS — M8580 Other specified disorders of bone density and structure, unspecified site: Secondary | ICD-10-CM

## 2014-11-11 DIAGNOSIS — O2230 Deep phlebothrombosis in pregnancy, unspecified trimester: Secondary | ICD-10-CM

## 2014-11-11 DIAGNOSIS — Z86718 Personal history of other venous thrombosis and embolism: Secondary | ICD-10-CM | POA: Diagnosis not present

## 2014-11-11 DIAGNOSIS — Z780 Asymptomatic menopausal state: Secondary | ICD-10-CM | POA: Diagnosis not present

## 2014-11-11 LAB — POCT INR: INR: 1.8

## 2014-11-11 NOTE — Telephone Encounter (Signed)
-----   Message from Mosie Lukes, MD sent at 11/11/2014 10:53 AM EDT ----- Notify osteopenia is stable, increase exercise as tolerated. Calcium tid for a total of 1500 mg daily preferably from diet, if uses some supplements once or twice a day use calcium citrate, needs vitamin D 2000 IU daily and to check levels. Test has already been ordered in the chart from last month

## 2014-11-11 NOTE — Progress Notes (Signed)
Pre visit review using our clinic review tool, if applicable. No additional management support is needed unless otherwise documented below in the visit note. 

## 2014-11-11 NOTE — Addendum Note (Signed)
Addended by: Vernie Shanks E on: 11/11/2014 12:05 PM   Modules accepted: Level of Service

## 2014-11-11 NOTE — Patient Instructions (Signed)
Per Dr. [name] another 2.5mg  today to make a total of 5 mg. Continue 5 mg on Thur,Fri,Sat and Sun,then resume normal schedule on Monday of 2.5mg  onMon,Wed,and Fri. 5 mg on Tu,Thur,Sat and Sun and return for INR check in 2 weeks.

## 2014-11-11 NOTE — Telephone Encounter (Signed)
Pt notified of results. No questions or concerns at this time.  

## 2014-11-24 ENCOUNTER — Ambulatory Visit (INDEPENDENT_AMBULATORY_CARE_PROVIDER_SITE_OTHER): Payer: Medicare Other

## 2014-11-24 DIAGNOSIS — Z86718 Personal history of other venous thrombosis and embolism: Secondary | ICD-10-CM

## 2014-11-24 DIAGNOSIS — I82409 Acute embolism and thrombosis of unspecified deep veins of unspecified lower extremity: Secondary | ICD-10-CM | POA: Diagnosis not present

## 2014-11-24 LAB — POCT INR: INR: 1.8

## 2014-11-24 NOTE — Patient Instructions (Signed)
Per Dr. Charlett Blake Monday and Friday 2.5 mg, Tuesday,Wednesday,Thursday,Saturday and Sunday 5 mg. Return for recheck of INR in 3 weeks.

## 2014-12-17 ENCOUNTER — Telehealth: Payer: Self-pay | Admitting: Family Medicine

## 2014-12-17 ENCOUNTER — Ambulatory Visit: Payer: Medicare Other

## 2014-12-17 ENCOUNTER — Other Ambulatory Visit: Payer: Self-pay | Admitting: Family Medicine

## 2014-12-17 DIAGNOSIS — M810 Age-related osteoporosis without current pathological fracture: Secondary | ICD-10-CM | POA: Diagnosis not present

## 2014-12-17 DIAGNOSIS — S0093XA Contusion of unspecified part of head, initial encounter: Secondary | ICD-10-CM | POA: Diagnosis not present

## 2014-12-17 DIAGNOSIS — G8929 Other chronic pain: Secondary | ICD-10-CM | POA: Diagnosis not present

## 2014-12-17 DIAGNOSIS — M549 Dorsalgia, unspecified: Secondary | ICD-10-CM | POA: Diagnosis not present

## 2014-12-17 DIAGNOSIS — I671 Cerebral aneurysm, nonruptured: Secondary | ICD-10-CM | POA: Diagnosis not present

## 2014-12-17 DIAGNOSIS — S0990XA Unspecified injury of head, initial encounter: Secondary | ICD-10-CM | POA: Diagnosis not present

## 2014-12-17 DIAGNOSIS — I10 Essential (primary) hypertension: Secondary | ICD-10-CM | POA: Diagnosis not present

## 2014-12-17 DIAGNOSIS — M79606 Pain in leg, unspecified: Secondary | ICD-10-CM | POA: Diagnosis not present

## 2014-12-17 DIAGNOSIS — Z885 Allergy status to narcotic agent status: Secondary | ICD-10-CM | POA: Diagnosis not present
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
## 2014-12-17 NOTE — Telephone Encounter (Signed)
Her INR was low last month so it would be reasonable for her to hold her coumadin tomorrow then to restart the next day if she is feeling OK. If she develops any HA or any concerning neurologic complaints, confusion, vision changes etc she needs to seek care. I did place a neurosurgery consult but it will take more than a day to get her in.

## 2014-12-18 NOTE — Telephone Encounter (Signed)
Called informed the daughter Judeen Hammans of PCP instructions.

## 2014-12-21 DIAGNOSIS — Z6829 Body mass index (BMI) 29.0-29.9, adult: Secondary | ICD-10-CM | POA: Diagnosis not present

## 2014-12-21 DIAGNOSIS — I671 Cerebral aneurysm, nonruptured: Secondary | ICD-10-CM | POA: Diagnosis not present

## 2014-12-21 DIAGNOSIS — I10 Essential (primary) hypertension: Secondary | ICD-10-CM | POA: Diagnosis not present

## 2014-12-22 ENCOUNTER — Other Ambulatory Visit (HOSPITAL_COMMUNITY): Payer: Self-pay | Admitting: Neurosurgery

## 2014-12-22 ENCOUNTER — Telehealth: Payer: Self-pay | Admitting: Family Medicine

## 2014-12-22 ENCOUNTER — Other Ambulatory Visit: Payer: Self-pay | Admitting: Neurosurgery

## 2014-12-22 DIAGNOSIS — I671 Cerebral aneurysm, nonruptured: Secondary | ICD-10-CM

## 2014-12-22 NOTE — Telephone Encounter (Signed)
[facility] at [phone] left detailed message of information and to call back or fax to our office.
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
## 2014-12-22 NOTE — Telephone Encounter (Signed)
So would like to bridge her with Lovenox due to her history, please check with neurosurgery office and have nurse confirm that surgeon is OK with that. Then check with patient and see if she has ever done that. If she is willing she can come in and discuss and have teaching and/or home health can help get her started.

## 2014-12-23 NOTE — Telephone Encounter (Signed)
[facility] called back and asked for Mikayla [name] and they will still have to speak to the doctor today and will call back tomorrow.

## 2014-12-24 ENCOUNTER — Telehealth: Payer: Self-pay
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
## 2014-12-24 NOTE — Telephone Encounter (Signed)
Called the patient informed PCP has received information from [facility].  Once PCP has reviewed will let the patient know instructions on the lovenox.

## 2014-12-24 NOTE — Telephone Encounter (Signed)
The Lovenox will keep her from getting a blood clot. She should proceed with procedure. She should come in as soon as she can for her PT/INR check today or Monday continue current dose for now.

## 2014-12-24 NOTE — Telephone Encounter (Signed)
Spoke to [place] at [facility] and they are faxing over the order/procedure instructions/dates for this patient.  Once receive  We are to write in PCP instructions (Lovenox)and fax back to Attn: Manuela Schwartz at [phone] by 12/25/14 for surgeon to ok.
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
## 2014-12-25 ENCOUNTER — Other Ambulatory Visit: Payer: Self-pay | Admitting: Family Medicine

## 2014-12-25 MED ORDER — ENOXAPARIN SODIUM 80 MG/0.8ML ~~LOC~~ SOLN: 80.0000 mg | Freq: Two times a day (BID) | SUBCUTANEOUS | Status: DC

## 2014-12-25 NOTE — Telephone Encounter (Signed)
Dr. Charlett Blake, Dr. Cleotilde Neer office called to give the ok for your instructions. Procedure is scheduled for December 9, be off coumadin 1 week prior. Please give me exact instructions on lovenox prescription to send in to patient and start date.  She has PT/INR appt. For Monday

## 2014-12-25 NOTE — Telephone Encounter (Signed)
Patient has been informed to pickup lovenox prescription at the pharmacy and bring with her on Monday.  The nurse will instruct her on how to administer lovenox.

## 2014-12-25 NOTE — Telephone Encounter (Signed)
Sent in lovenox.   Called the patient left a message to call back.

## 2014-12-25 NOTE — Telephone Encounter (Signed)
Patient called back to inform Mikayla [name] does not have lovenox, but they are calling around other pharmacies to find it.  I did inform the patient I will followup with her first thing Monday morning before she comes in and if still has not gotten it we can call into our pharmacy downstairs.

## 2014-12-25 NOTE — Telephone Encounter (Signed)
Lovenox 80 mg SQ bid x 7 days as directed

## 2014-12-25 NOTE — Telephone Encounter (Signed)
Called the patient informed of PCP instructions. She is schedule PT/INR for Monday.   Please give instructions on what to send in to the pharmacy for lovenox, maybe they could instruct her on Monday with that as well when here for PT.INR check?? When to start/stop?

## 2014-12-28 ENCOUNTER — Ambulatory Visit (INDEPENDENT_AMBULATORY_CARE_PROVIDER_SITE_OTHER): Payer: Medicare Other

## 2014-12-28 DIAGNOSIS — Z86718 Personal history of other venous thrombosis and embolism: Secondary | ICD-10-CM

## 2014-12-28 DIAGNOSIS — I749 Embolism and thrombosis of unspecified artery: Secondary | ICD-10-CM | POA: Diagnosis not present

## 2014-12-28 LAB — POCT INR: INR: 2

## 2014-12-28 NOTE — Progress Notes (Addendum)
Patient will have procedure on 01/15/15. Advised to stop Coumadin 7 days prior to procedure and to start Lovenox  80 mg injections twice daily for 7 days. Stop day prior to procedure. Restart Coumadin day after procedure per Dr. Charlett Blake. Patient voiced understanding.

## 2014-12-28 NOTE — Telephone Encounter (Signed)
Called the patient this morning to confirm she did get the lovenox from the pharmacy.

## 2014-12-28 NOTE — Patient Instructions (Signed)
Per Dr. Charlett Blake continue current dose. Coumadin 5 mg every day except for Monday and Friday 2.5 mg. (Stop Coumadin 7 days prior to procedure.  Start Lovenox 80 mg for twice daily for  7 days then stop on the day prior to procedure (8th). Restart Coumadin day after procedure). Return for INR 4 weeks.

## 2015-01-04 NOTE — Telephone Encounter (Signed)
All of that was correct. Thanks

## 2015-01-04 NOTE — Telephone Encounter (Signed)
The patient was seen in the office by Santiago Glad LPN who went over her lovenox instructions (APPT. Date 12/28/2014) The patient just needs confirmation.  Her procedure is for 01/15/2015.  She was instructed by stop coumadin 1 week prior and take lovenox for 7 days.  Restart coumadin day after procedure.she wants PCP to confirm dates for lovenox is Dec. 2nd through 8th, procedure on the 9th (no meds. That day), restart coumadin the day after.  Advise please if correct.

## 2015-01-05 NOTE — Telephone Encounter (Signed)
Called the patient confirmed ok per PCP.  Reviewed with the patient RN's instructions from 12/28/2014 appt.  Patient will see PCP on 01/11/2015 before actual  Procedure will review instructions then as well.

## 2015-01-07 ENCOUNTER — Other Ambulatory Visit (INDEPENDENT_AMBULATORY_CARE_PROVIDER_SITE_OTHER): Payer: Medicare Other

## 2015-01-07 ENCOUNTER — Ambulatory Visit: Payer: Medicare Other

## 2015-01-07 DIAGNOSIS — Z780 Asymptomatic menopausal state: Secondary | ICD-10-CM | POA: Diagnosis not present

## 2015-01-07 DIAGNOSIS — M8580 Other specified disorders of bone density and structure, unspecified site: Secondary | ICD-10-CM

## 2015-01-07 DIAGNOSIS — I10 Essential (primary) hypertension: Secondary | ICD-10-CM | POA: Diagnosis not present

## 2015-01-07 DIAGNOSIS — Z23 Encounter for immunization: Secondary | ICD-10-CM

## 2015-01-07 DIAGNOSIS — E785 Hyperlipidemia, unspecified: Secondary | ICD-10-CM

## 2015-01-07 DIAGNOSIS — Z7901 Long term (current) use of anticoagulants: Secondary | ICD-10-CM

## 2015-01-07 LAB — CBC
HEMATOCRIT: 41.7 % (ref 36.0–46.0)
Hemoglobin: 13.6 g/dL (ref 12.0–15.0)
MCHC: 32.6 g/dL (ref 30.0–36.0)
MCV: 93.5 fl (ref 78.0–100.0)
PLATELETS: 226 10*3/uL (ref 150.0–400.0)
RBC: 4.47 Mil/uL (ref 3.87–5.11)
RDW: 14.7 % (ref 11.5–15.5)
WBC: 7.3 10*3/uL (ref 4.0–10.5)

## 2015-01-07 LAB — LIPID PANEL
CHOLESTEROL: 199 mg/dL (ref 0–200)
HDL: 57.3 mg/dL (ref >39.00)
LDL CALC: 124 mg/dL — AB (ref 0–99)
NonHDL: 141.92
Total CHOL/HDL Ratio: 3
Triglycerides: 88 mg/dL (ref 0.0–149.0)
VLDL: 17.6 mg/dL (ref 0.0–40.0)

## 2015-01-07 LAB — COMPREHENSIVE METABOLIC PANEL
ALBUMIN: 3.9 g/dL (ref 3.5–5.2)
ALT: 18 U/L (ref 0–35)
AST: 18 U/L (ref 0–37)
Alkaline Phosphatase: 76 U/L (ref 39–117)
BUN: 15 mg/dL (ref 6–23)
CALCIUM: 9.3 mg/dL (ref 8.4–10.5)
CHLORIDE: 105 meq/L (ref 96–112)
CO2: 30 meq/L (ref 19–32)
CREATININE: 0.65 mg/dL (ref 0.40–1.20)
GFR: 94.13 mL/min (ref >60.00)
GLUCOSE: 84 mg/dL (ref 70–99)
POTASSIUM: 4.9 meq/L (ref 3.5–5.1)
SODIUM: 142 meq/L (ref 135–145)
Total Bilirubin: 0.6 mg/dL (ref 0.2–1.2)
Total Protein: 6.9 g/dL (ref 6.0–8.3)

## 2015-01-07 LAB — VITAMIN D 25 HYDROXY (VIT D DEFICIENCY, FRACTURES): VITD: 51.26 ng/mL (ref 30.00–100.00)

## 2015-01-07 LAB — TSH: TSH: 1.9 u[IU]/mL (ref 0.35–4.50)

## 2015-01-11 ENCOUNTER — Ambulatory Visit (INDEPENDENT_AMBULATORY_CARE_PROVIDER_SITE_OTHER): Payer: Medicare Other | Admitting: Family Medicine

## 2015-01-11 ENCOUNTER — Encounter: Payer: Self-pay | Admitting: Family Medicine

## 2015-01-11 VITALS — BP 130/86 | HR 77 | Temp 98.3°F | Ht 65.0 in | Wt 176.0 lb

## 2015-01-11 DIAGNOSIS — E785 Hyperlipidemia, unspecified: Secondary | ICD-10-CM | POA: Diagnosis not present

## 2015-01-11 DIAGNOSIS — I671 Cerebral aneurysm, nonruptured: Secondary | ICD-10-CM | POA: Diagnosis not present

## 2015-01-11 DIAGNOSIS — K219 Gastro-esophageal reflux disease without esophagitis: Secondary | ICD-10-CM | POA: Diagnosis not present

## 2015-01-11 DIAGNOSIS — I10 Essential (primary) hypertension: Secondary | ICD-10-CM

## 2015-01-11 DIAGNOSIS — M8580 Other specified disorders of bone density and structure, unspecified site: Secondary | ICD-10-CM

## 2015-01-11 NOTE — Patient Instructions (Addendum)
Moist heat to neck 15 minutes twice daily and stretch for neck pain  Can drop to Omeprazole every other day if no increase in heartburn then can just use as needed. Can use Tums imbetween  Post-Concussion Syndrome Post-concussion syndrome describes the symptoms that can occur after a head injury. These symptoms can last from weeks to months. CAUSES  It is not clear why some head injuries cause post-concussion syndrome. It can occur whether your head injury was mild or severe and whether you were wearing head protection or not.  SIGNS AND SYMPTOMS  Memory difficulties.  Dizziness.  Headaches.  Double vision or blurry vision.  Sensitivity to light.  Hearing difficulties.  Depression.  Tiredness.  Weakness.  Difficulty with concentration.  Difficulty sleeping or staying asleep.  Vomiting.  Poor balance or instability on your feet.  Slow reaction time.  Difficulty learning and remembering things you have heard. DIAGNOSIS  There is no test to determine whether you have post-concussion syndrome. Your health care provider may order an imaging scan of your brain, such as a CT scan, to check for other problems that may be causing your symptoms (such as a severe injury inside your skull). TREATMENT  Usually, these problems disappear over time without medical care. Your health care provider may prescribe medicine to help ease your symptoms. It is important to follow up with a neurologist to evaluate your recovery and address any lingering symptoms or issues. HOME CARE INSTRUCTIONS   Take medicines only as directed by your health care provider. Do not take aspirin. Aspirin can slow blood clotting.  Sleep with your head slightly elevated to help with headaches.  Avoid any situation where there is potential for another head injury. This includes football, hockey, soccer, basketball, martial arts, downhill snow sports, and horseback riding. Your condition will get worse every time  you experience a concussion. You should avoid these activities until you are evaluated by the appropriate follow-up health care providers.  Keep all follow-up visits as directed by your health care provider. This is important. SEEK MEDICAL CARE IF:  You have increased problems paying attention or concentrating.  You have increased difficulty remembering or learning new information.  You need more time to complete tasks or assignments than before.  You have increased irritability or decreased ability to cope with stress.  You have more symptoms than before. Seek medical care if you have any of the following symptoms for more than two weeks after your injury:  Lasting (chronic) headaches.  Dizziness or balance problems.  Nausea.  Vision problems.  Increased sensitivity to noise or light.  Depression or mood swings.  Anxiety or irritability.  Memory problems.  Difficulty concentrating or paying attention.  Sleep problems.  Feeling tired all the time. SEEK IMMEDIATE MEDICAL CARE IF:  You have confusion or unusual drowsiness.  Others find it difficult to wake you up.  You have nausea or persistent, forceful vomiting.  You feel like you are moving when you are not (vertigo). Your eyes may move rapidly back and forth.  You have convulsions or faint.  You have severe, persistent headaches that are not relieved by medicine.  You cannot use your arms or legs normally.  One of your pupils is larger than the other.  You have clear or bloody discharge from your nose or ears.  Your problems are getting worse, not better. MAKE SURE YOU:  Understand these instructions.  Will watch your condition.  Will get help right away if you are not  doing well or get worse.   This information is not intended to replace advice given to you by your health care provider. Make sure you discuss any questions you have with your health care provider.   Document Released: 07/15/2001  Document Revised: 02/13/2014 Document Reviewed: 04/30/2013 Elsevier Interactive Patient Education 2016 Else[employer] Cervical Sprain A cervical sprain is an injury in the neck in which the strong, fibrous tissues (ligaments) that connect your neck bones stretch or tear. Cervical sprains can range from mild to severe. Severe cervical sprains can cause the neck vertebrae to be unstable. This can lead to damage of the spinal cord and can result in serious nervous system problems. The amount of time it takes for a cervical sprain to get better depends on the cause and extent of the injury. Most cervical sprains heal in 1 to 3 weeks. CAUSES  Severe cervical sprains may be caused by:   Contact sport injuries (such as from football, rugby, wrestling, hockey, auto racing, gymnastics, diving, martial arts, or boxing).   Motor vehicle collisions.   Whiplash injuries. This is an injury from a sudden forward and backward whipping movement of the head and neck.  Falls.  Mild cervical sprains may be caused by:   Being in an awkward position, such as while cradling a telephone between your ear and shoulder.   Sitting in a chair that does not offer proper support.   Working at a poorly Landscape architect station.   Looking up or down for long periods of time.  SYMPTOMS   Pain, soreness, stiffness, or a burning sensation in the front, back, or sides of the neck. This discomfort may develop immediately after the injury or slowly, 24 hours or more after the injury.   Pain or tenderness directly in the middle of the back of the neck.   Shoulder or upper back pain.   Limited ability to move the neck.   Headache.   Dizziness.   Weakness, numbness, or tingling in the hands or arms.   Muscle spasms.   Difficulty swallowing or chewing.   Tenderness and swelling of the neck.  DIAGNOSIS  Most of the time your health care provider can diagnose a cervical sprain by taking your  history and doing a physical exam. Your health care provider will ask about previous neck injuries and any known neck problems, such as arthritis in the neck. X-rays may be taken to find out if there are any other problems, such as with the bones of the neck. Other tests, such as a CT scan or MRI, may also be needed.  TREATMENT  Treatment depends on the severity of the cervical sprain. Mild sprains can be treated with rest, keeping the neck in place (immobilization), and pain medicines. Severe cervical sprains are immediately immobilized. Further treatment is done to help with pain, muscle spasms, and other symptoms and may include:  Medicines, such as pain relievers, numbing medicines, or muscle relaxants.   Physical therapy. This may involve stretching exercises, strengthening exercises, and posture training. Exercises and improved posture can help stabilize the neck, strengthen muscles, and help stop symptoms from returning.  HOME CARE INSTRUCTIONS   Put ice on the injured area.   Put ice in a plastic bag.   Place a towel between your skin and the bag.   Leave the ice on for 15-20 minutes, 3-4 times a day.   If your injury was severe, you may have been given a cervical collar to wear. A  cervical collar is a two-piece collar designed to keep your neck from moving while it heals.  Do not remove the collar unless instructed by your health care provider.  If you have long hair, keep it outside of the collar.  Ask your health care provider before making any adjustments to your collar. Minor adjustments may be required over time to improve comfort and reduce pressure on your chin or on the back of your head.  Ifyou are allowed to remove the collar for cleaning or bathing, follow your health care provider's instructions on how to do so safely.  Keep your collar clean by wiping it with mild soap and water and drying it completely. If the collar you have been given includes removable  pads, remove them every 1-2 days and hand wash them with soap and water. Allow them to air dry. They should be completely dry before you wear them in the collar.  If you are allowed to remove the collar for cleaning and bathing, wash and dry the skin of your neck. Check your skin for irritation or sores. If you see any, tell your health care provider.  Do not drive while wearing the collar.   Only take over-the-counter or prescription medicines for pain, discomfort, or fever as directed by your health care provider.   Keep all follow-up appointments as directed by your health care provider.   Keep all physical therapy appointments as directed by your health care provider.   Make any needed adjustments to your workstation to promote good posture.   Avoid positions and activities that make your symptoms worse.   Warm up and stretch before being active to help prevent problems.  SEEK MEDICAL CARE IF:   Your pain is not controlled with medicine.   You are unable to decrease your pain medicine over time as planned.   Your activity level is not improving as expected.  SEEK IMMEDIATE MEDICAL CARE IF:   You develop any bleeding.  You develop stomach upset.  You have signs of an allergic reaction to your medicine.   Your symptoms get worse.   You develop new, unexplained symptoms.   You have numbness, tingling, weakness, or paralysis in any part of your body.  MAKE SURE YOU:   Understand these instructions.  Will watch your condition.  Will get help right away if you are not doing well or get worse.   This information is not intended to replace advice given to you by your health care provider. Make sure you discuss any questions you have with your health care provider.   Document Released: 11/20/2006 Document Revised: 01/28/2013 Document Reviewed: 07/31/2012 Elsevier Interactive Patient Education [employer].

## 2015-01-11 NOTE — Progress Notes (Signed)
Pre visit review using our clinic review tool, if applicable. No additional management support is needed unless otherwise documented below in the visit note. 

## 2015-01-15 ENCOUNTER — Other Ambulatory Visit (HOSPITAL_COMMUNITY): Payer: Self-pay | Admitting: Neurosurgery

## 2015-01-15 ENCOUNTER — Ambulatory Visit (HOSPITAL_COMMUNITY)
Admission: RE | Admit: 2015-01-15 | Discharge: 2015-01-15 | Disposition: A | Discharge status: Home / Self Care | Payer: Medicare Other | Source: Ambulatory Visit | Attending: Neurosurgery | Admitting: Neurosurgery

## 2015-01-15 DIAGNOSIS — D649 Anemia, unspecified: Secondary | ICD-10-CM | POA: Diagnosis not present

## 2015-01-15 DIAGNOSIS — I350 Nonrheumatic aortic (valve) stenosis: Secondary | ICD-10-CM | POA: Diagnosis not present

## 2015-01-15 DIAGNOSIS — I6523 Occlusion and stenosis of bilateral carotid arteries: Secondary | ICD-10-CM | POA: Insufficient documentation

## 2015-01-15 DIAGNOSIS — Z7901 Long term (current) use of anticoagulants: Secondary | ICD-10-CM | POA: Insufficient documentation

## 2015-01-15 DIAGNOSIS — I671 Cerebral aneurysm, nonruptured: Secondary | ICD-10-CM | POA: Insufficient documentation

## 2015-01-15 DIAGNOSIS — R930 Abnormal findings on diagnostic imaging of skull and head, not elsewhere classified: Secondary | ICD-10-CM | POA: Diagnosis present

## 2015-01-15 DIAGNOSIS — K219 Gastro-esophageal reflux disease without esophagitis: Secondary | ICD-10-CM | POA: Insufficient documentation

## 2015-01-15 DIAGNOSIS — Z96642 Presence of left artificial hip joint: Secondary | ICD-10-CM | POA: Diagnosis not present

## 2015-01-15 DIAGNOSIS — E785 Hyperlipidemia, unspecified: Secondary | ICD-10-CM | POA: Insufficient documentation

## 2015-01-15 DIAGNOSIS — Z86718 Personal history of other venous thrombosis and embolism: Secondary | ICD-10-CM | POA: Insufficient documentation

## 2015-01-15 LAB — CBC WITH DIFFERENTIAL/PLATELET
BASOS PCT: 1 %
Basophils Absolute: 0 10*3/uL (ref 0.0–0.1)
EOS ABS: 0.1 10*3/uL (ref 0.0–0.7)
Eosinophils Relative: 2 %
HEMATOCRIT: 38.9 % (ref 36.0–46.0)
HEMOGLOBIN: 13 g/dL (ref 12.0–15.0)
LYMPHS ABS: 1.1 10*3/uL (ref 0.7–4.0)
Lymphocytes Relative: 18 %
MCH: 31.3 pg (ref 26.0–34.0)
MCHC: 33.4 g/dL (ref 30.0–36.0)
MCV: 93.7 fL (ref 78.0–100.0)
MONOS PCT: 9 %
Monocytes Absolute: 0.6 10*3/uL (ref 0.1–1.0)
NEUTROS ABS: 4.4 10*3/uL (ref 1.7–7.7)
NEUTROS PCT: 70 %
Platelets: 187 10*3/uL (ref 150–400)
RBC: 4.15 MIL/uL (ref 3.87–5.11)
RDW: 13.7 % (ref 11.5–15.5)
WBC: 6.3 10*3/uL (ref 4.0–10.5)

## 2015-01-15 LAB — BASIC METABOLIC PANEL
ANION GAP: 9 (ref 5–15)
BUN: 12 mg/dL (ref 6–20)
CHLORIDE: 104 mmol/L (ref 101–111)
CO2: 28 mmol/L (ref 22–32)
CREATININE: 0.67 mg/dL (ref 0.44–1.00)
Calcium: 9.1 mg/dL (ref 8.9–10.3)
GFR calc non Af Amer: >60 mL/min (ref >60)
Glucose, Bld: 101 mg/dL — ABNORMAL HIGH (ref 65–99)
POTASSIUM: 3.8 mmol/L (ref 3.5–5.1)
SODIUM: 141 mmol/L (ref 135–145)

## 2015-01-15 LAB — APTT: aPTT: 31 seconds (ref 24–37)

## 2015-01-15 LAB — PROTIME-INR
INR: 1.04 (ref 0.00–1.49)
PROTHROMBIN TIME: 13.8 s (ref 11.6–15.2)

## 2015-01-15 MED ORDER — MIDAZOLAM HCL 2 MG/2ML IJ SOLN
INTRAMUSCULAR | Status: AC | PRN
  Administered 2015-01-15: 0.5 mg via INTRAVENOUS

## 2015-01-15 MED ORDER — MIDAZOLAM HCL 2 MG/2ML IJ SOLN
INTRAMUSCULAR | Status: AC
  Filled 2015-01-15: qty 2

## 2015-01-15 MED ORDER — HYDROCODONE-ACETAMINOPHEN 5-325 MG PO TABS: 1.0000 | ORAL_TABLET | ORAL | Status: DC | PRN

## 2015-01-15 MED ORDER — HEPARIN SODIUM (PORCINE) 1000 UNIT/ML IJ SOLN
INTRAMUSCULAR | Status: AC | PRN
Start: 2015-01-15 — End: 2015-01-15
  Administered 2015-01-15: 2000 [IU] via INTRAVENOUS

## 2015-01-15 MED ORDER — SODIUM CHLORIDE 0.9 % IV SOLN: INTRAVENOUS | Status: DC

## 2015-01-15 MED ORDER — HEPARIN SOD (PORK) LOCK FLUSH 100 UNIT/ML IV SOLN
INTRAVENOUS | Status: AC
  Filled 2015-01-15: qty 20

## 2015-01-15 MED ORDER — FENTANYL CITRATE (PF) 100 MCG/2ML IJ SOLN
INTRAMUSCULAR | Status: AC
  Filled 2015-01-15: qty 2

## 2015-01-15 MED ORDER — LIDOCAINE HCL 1 % IJ SOLN
INTRAMUSCULAR | Status: AC
  Filled 2015-01-15: qty 20

## 2015-01-15 MED ORDER — FENTANYL CITRATE (PF) 100 MCG/2ML IJ SOLN
INTRAMUSCULAR | Status: AC | PRN
  Administered 2015-01-15: 25 ug via INTRAVENOUS

## 2015-01-15 MED ORDER — IOHEXOL 300 MG/ML  SOLN
200.0000 mL | Freq: Once | INTRAMUSCULAR | Status: AC | PRN
  Administered 2015-01-15: 110 mL via INTRAVENOUS

## 2015-01-15 NOTE — Progress Notes (Addendum)
Pt up to bathroom with assist, void x1, tolerated well.  Right groin level 0 post ambulation.  PIV to right hand d/c'd, site WNL.
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
## 2015-01-15 NOTE — Discharge Instructions (Signed)
Angiogram, Care After °Refer to this sheet in the next few weeks. These instructions provide you with information about caring for yourself after your procedure. Your health care provider may also give you more specific instructions. Your treatment has been planned according to current medical practices, but problems sometimes occur. Call your health care provider if you have any problems or questions after your procedure. °WHAT TO EXPECT AFTER THE PROCEDURE °After your procedure, it is typical to have the following: °· Bruising at the catheter insertion site that usually fades within 1-2 weeks. °· Blood collecting in the tissue (hematoma) that may be painful to the touch. It should usually decrease in size and tenderness within 1-2 weeks. °HOME CARE INSTRUCTIONS °· Take medicines only as directed by your health care provider. °· You may shower 24-48 hours after the procedure or as directed by your health care provider. Remove the bandage (dressing) and gently wash the site with plain soap and water. Pat the area dry with a clean towel. Do not rub the site, because this may cause bleeding. °· Do not take baths, swim, or use a hot tub until your health care provider approves. °· Check your insertion site every day for redness, swelling, or drainage. °· Do not apply powder or lotion to the site. °· Do not lift over 10 lb (4.5 kg) for 5 days after your procedure or as directed by your health care provider. °· Ask your health care provider when it is okay to: °¨ Return to work or school. °¨ Resume usual physical activities or sports. °¨ Resume sexual activity. °· Do not drive home if you are discharged the same day as the procedure. Have someone else drive you. °· You may drive 24 hours after the procedure unless otherwise instructed by your health care provider. °· Do not operate machinery or power tools for 24 hours after the procedure or as directed by your health care provider. °· If your procedure was done as an  outpatient procedure, which means that you went home the same day as your procedure, a responsible adult should be with you for the first 24 hours after you arrive home. °· Keep all follow-up visits as directed by your health care provider. This is important. °SEEK MEDICAL CARE IF: °· You have a fever. °· You have chills. °· You have increased bleeding from the catheter insertion site. Hold pressure on the site and call 911. °SEEK IMMEDIATE MEDICAL CARE IF: °· You have unusual pain at the catheter insertion site. °· You have redness, warmth, or swelling at the catheter insertion site. °· You have drainage (other than a small amount of blood on the dressing) from the catheter insertion site. °· The catheter insertion site is bleeding, and the bleeding does not stop after 30 minutes of holding steady pressure on the site. °· The area near or just beyond the catheter insertion site becomes pale, cool, tingly, or numb. °  °This information is not intended to replace advice given to you by your health care provider. Make sure you discuss any questions you have with your health care provider. °  °Document Released: 08/11/2004 Document Revised: 02/13/2014 Document Reviewed: 06/26/2012 °Elsevier Interactive Patient Education ©2016 [employer] ° °

## 2015-01-16 ENCOUNTER — Encounter: Payer: Self-pay | Admitting: Family Medicine

## 2015-01-16 DIAGNOSIS — I671 Cerebral aneurysm, nonruptured: Secondary | ICD-10-CM

## 2015-01-16 HISTORY — DX: Cerebral aneurysm, nonruptured: I67.1

## 2015-01-16 NOTE — Assessment & Plan Note (Signed)
Recommend calcium intake of 1200 to 1500 mg daily, divided into roughly 3 doses. Best source is the diet and a single dairy serving is about 500 mg, a supplement of calcium citrate once or twice daily to balance diet is fine if not getting enough in diet. Also need Vitamin D 2000 IU caps, 1 cap daily if not already taking vitamin D. Also recommend weight baring exercise on hips and upper body to keep bones strong 

## 2015-01-16 NOTE — Assessment & Plan Note (Signed)
Well controlled, no changes to meds. Encouraged heart healthy diet such as the DASH diet and exercise as tolerated.  °
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
## 2015-01-16 NOTE — Assessment & Plan Note (Signed)
Avoid offending foods, start probiotics. Do not eat large meals in late evening and consider raising head of bed.  
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
## 2015-01-16 NOTE — Assessment & Plan Note (Signed)
Encouraged heart healthy diet, increase exercise, avoid trans fats, consider a krill oil cap daily 

## 2015-01-19 ENCOUNTER — Telehealth: Payer: Self-pay | Admitting: Family Medicine

## 2015-01-19 MED ORDER — OMEPRAZOLE 40 MG PO CPDR: 40.0000 mg | DELAYED_RELEASE_CAPSULE | Freq: Every day | ORAL | Status: DC

## 2015-01-19 NOTE — Telephone Encounter (Signed)
Refill done.  

## 2015-01-19 NOTE — Telephone Encounter (Signed)
Relation to [id number] Call back [place], [place] [phone] (Phone) [phone] (Fax)         Reason for call:  Patient requesting a refill omeprazole (PRILOSEC) 40 MG capsule

## 2015-01-21 DIAGNOSIS — Z6828 Body mass index (BMI) 28.0-28.9, adult: Secondary | ICD-10-CM | POA: Diagnosis not present

## 2015-01-21 DIAGNOSIS — I671 Cerebral aneurysm, nonruptured: Secondary | ICD-10-CM | POA: Diagnosis not present

## 2015-01-21 DIAGNOSIS — I10 Essential (primary) hypertension: Secondary | ICD-10-CM | POA: Diagnosis not present

## 2015-01-27 ENCOUNTER — Ambulatory Visit: Payer: Medicare Other

## 2015-01-27 ENCOUNTER — Ambulatory Visit (INDEPENDENT_AMBULATORY_CARE_PROVIDER_SITE_OTHER): Payer: Medicare Other

## 2015-01-27 DIAGNOSIS — I749 Embolism and thrombosis of unspecified artery: Secondary | ICD-10-CM | POA: Diagnosis not present

## 2015-01-27 DIAGNOSIS — Z86718 Personal history of other venous thrombosis and embolism: Secondary | ICD-10-CM

## 2015-01-27 LAB — POCT INR: INR: 1.6

## 2015-01-27 NOTE — Progress Notes (Signed)
Pre visit review using our clinic review tool, if applicable. No additional management support is needed unless otherwise documented below in the visit note.  Patient in for INR check.  INR = 1.6  Medication adjusted.  Patient to return in 1 week for repeat INR

## 2015-01-27 NOTE — Patient Instructions (Signed)
Per Mikayla [name], Coumadin 5 mg every day except for Monday take  2.5 mg. Return for INR check 1 weeks.

## 2015-02-03 ENCOUNTER — Ambulatory Visit (INDEPENDENT_AMBULATORY_CARE_PROVIDER_SITE_OTHER): Payer: Medicare Other

## 2015-02-03 DIAGNOSIS — Z86718 Personal history of other venous thrombosis and embolism: Secondary | ICD-10-CM | POA: Diagnosis not present

## 2015-02-03 LAB — POCT INR: INR: 2

## 2015-02-03 NOTE — Patient Instructions (Signed)
Per Zane Herald, Coumadin 5 mg every day except for Monday take  2.5 mg. Return for INR check 4 weeks.

## 2015-02-03 NOTE — Progress Notes (Signed)
Pre visit review using our clinic review tool, if applicable. No additional management support is needed unless otherwise documented below in the visit note.  Patient in for INR check. INR = 2.0 which is within prescribed range of 2.0-3.0   Patient states she has had no problems since last OV. Patient to return in 4 weeks

## 2015-02-04 ENCOUNTER — Telehealth: Payer: Self-pay | Admitting: Medical

## 2015-02-04 ENCOUNTER — Telehealth: Payer: Self-pay

## 2015-02-04 ENCOUNTER — Telehealth: Payer: Self-pay | Admitting: Family Medicine

## 2015-02-04 NOTE — Telephone Encounter (Signed)
Spoke with pt and she voices understanding and she will come back in a week for a recheck of the INR. Pt will take medication as instructed by provider from previous visit. Pt was advised that this was a precaution since she was on the low end of normal.

## 2015-02-04 NOTE — Telephone Encounter (Signed)
Pt is returning your call.   CB: [phone]

## 2015-02-04 NOTE — Telephone Encounter (Signed)
Left message for patient to return call regarding daily Coumadin dosage.

## 2015-02-04 NOTE — Telephone Encounter (Signed)
Error

## 2015-02-04 NOTE — Telephone Encounter (Signed)
Continue current coumadin regimen but recheck inr one week from 02-03-2015.

## 2015-02-05 ENCOUNTER — Telehealth: Payer: Self-pay | Admitting: Medical

## 2015-02-05 NOTE — Telephone Encounter (Signed)
Medication dosage clarified with patient per telephone call today.

## 2015-02-05 NOTE — Telephone Encounter (Signed)
Bottom of note I got states pt told to return in 4 wks per me. That was not the case I said return in one week. Will send this to Santiago Glad and to [place] to make sure pt follows up in one week as I advised.

## 2015-02-09 NOTE — Telephone Encounter (Signed)
Pt was advised on 02/05/15 to come back in one week for a follow up.

## 2015-02-10 ENCOUNTER — Ambulatory Visit (INDEPENDENT_AMBULATORY_CARE_PROVIDER_SITE_OTHER): Payer: Medicare Other

## 2015-02-10 DIAGNOSIS — I749 Embolism and thrombosis of unspecified artery: Secondary | ICD-10-CM | POA: Diagnosis not present

## 2015-02-10 DIAGNOSIS — Z86718 Personal history of other venous thrombosis and embolism: Secondary | ICD-10-CM

## 2015-02-10 LAB — POCT INR: INR: 2.5

## 2015-02-10 NOTE — Progress Notes (Signed)
Pre visit review using our clinic review tool, if applicable. No additional management support is needed unless otherwise documented below in the visit note.  Patient in for INR recheck. INR = 2.5. Goal 2.0-3.0. Patient has not missed doses nor had any diet changes. No complaints voiced.

## 2015-02-10 NOTE — Patient Instructions (Signed)
Per Dr. Larose Kells Continue same dose of 5 mg daily except for Mondays take 2.5 mg and return in 3 weeks.

## 2015-03-03 ENCOUNTER — Ambulatory Visit (INDEPENDENT_AMBULATORY_CARE_PROVIDER_SITE_OTHER): Payer: Medicare Other | Admitting: *Deleted

## 2015-03-03 DIAGNOSIS — Z86718 Personal history of other venous thrombosis and embolism: Secondary | ICD-10-CM | POA: Diagnosis not present

## 2015-03-03 LAB — POCT INR: INR: 2.7

## 2015-03-03 NOTE — Patient Instructions (Signed)
Per Dr. Birdie Riddle: Continue current regimen of 5 mg(1 tab) daily except on Mondays take 2.5 mg (0.5 tab). Return in 4 weeks.

## 2015-03-03 NOTE — Progress Notes (Signed)
Pre visit review using our clinic review tool, if applicable. No additional management support is needed unless otherwise documented below in the visit note.  Pt here for INR check. INR today 2.7. Per Dr. Birdie Riddle: Continue current regimen of 5 mg(1 tab) daily except on Mondays take 2.5 mg (0.5 tab). Return in 4 weeks. Pt verbalized understanding.   Next appt: 03/31/2015

## 2015-03-15 ENCOUNTER — Telehealth: Payer: Self-pay | Admitting: Family Medicine

## 2015-03-15 ENCOUNTER — Other Ambulatory Visit: Payer: Self-pay | Admitting: Family Medicine

## 2015-03-15 DIAGNOSIS — I671 Cerebral aneurysm, nonruptured: Secondary | ICD-10-CM

## 2015-03-15 NOTE — Telephone Encounter (Signed)
Relation to [id number] Call back number:[phone]   Reason for call:  Patient requesting a referral Dr. Dominica Severin P. Guy Begin, MD [address], [place], [place] [address] [phone] (patient states Dr. Saintclair Halsted sees patient in [facility] at the [facility] [address] ?)

## 2015-03-17 DIAGNOSIS — M545 Low back pain: Secondary | ICD-10-CM | POA: Diagnosis not present

## 2015-03-17 DIAGNOSIS — Z6829 Body mass index (BMI) 29.0-29.9, adult: Secondary | ICD-10-CM | POA: Diagnosis not present

## 2015-03-17 DIAGNOSIS — G8929 Other chronic pain: Secondary | ICD-10-CM | POA: Diagnosis not present

## 2015-03-24 ENCOUNTER — Other Ambulatory Visit: Payer: Self-pay | Admitting: Family Medicine

## 2015-03-24 ENCOUNTER — Encounter: Payer: Self-pay | Admitting: Physical Therapy

## 2015-03-24 ENCOUNTER — Ambulatory Visit: Payer: Medicare Other | Attending: Neurosurgery | Admitting: Physical Therapy

## 2015-03-24 DIAGNOSIS — M6289 Other specified disorders of muscle: Secondary | ICD-10-CM | POA: Insufficient documentation

## 2015-03-24 DIAGNOSIS — R262 Difficulty in walking, not elsewhere classified: Secondary | ICD-10-CM | POA: Diagnosis not present

## 2015-03-24 DIAGNOSIS — R269 Unspecified abnormalities of gait and mobility: Secondary | ICD-10-CM

## 2015-03-24 DIAGNOSIS — M545 Low back pain, unspecified: Secondary | ICD-10-CM

## 2015-03-24 DIAGNOSIS — R293 Abnormal posture: Secondary | ICD-10-CM

## 2015-03-24 DIAGNOSIS — R29898 Other symptoms and signs involving the musculoskeletal system: Secondary | ICD-10-CM

## 2015-03-24 NOTE — Therapy (Signed)
[facility] [address]  [place], [place], [address] Phone: [phone]   Fax:  [phone]  Physical Therapy Evaluation  Patient Details  Name: Mikayla [name] MRN: [id number] Date of Birth: 02-Feb-1941 Referring Provider: Consuella Lose, MD  Encounter Date: 03/24/2015      PT End of Session - 03/24/15 1405    Visit Number 1   Number of Visits 12   Date for PT Re-Evaluation 05/05/15   PT Start Time [id number]   PT Stop Time 1455   PT Time Calculation (min) 51 min      Past Medical History  Diagnosis Date  . Acute venous embolism and thrombosis of unspecified deep vessels of lower extremity     after broken foot screw left in place, left leg  . Aortic stenosis     echo 11/11: EF 60%, mild LVH, mod/severe AS with mean 32 mmHg, peak 55 mmHg, mild AI, mild MR, (previous echo 2010 with mean gradient 29 mmHg)  . Carotid stenosis     dopplers 11/11: 0-39% bilat  . Palpitations     PACs and atrial tachy  . Other congenital anomaly of toes   . Pain in joint, pelvic region and thigh   . Atrial tachycardia ([place])   . Dyslipidemia   . Anemia   . GERD (gastroesophageal reflux disease)   . Bilateral dry eyes   . Hyperlipidemia   . History of chicken pox   . H/O measles   . Abdominal pain 08/25/2014  . Peripheral edema 08/25/2014  . Dehydration 08/25/2014  . Hyperlipidemia, mild 10/08/2014  . Osteopenia 10/08/2014  . Brain aneurysm 01/16/2015    Past Surgical History  Procedure Laterality Date  . Tonsillectomy    . Radical hysterectomy    . Foot surgery    . Left hip replacement    . Hyroglossal duct cyst resection    . Left and right heart catheterization with coronary angiogram N/A 03/21/2013    Procedure: LEFT AND RIGHT HEART CATHETERIZATION WITH CORONARY ANGIOGRAM;  Surgeon: Minus Breeding, MD;  Location: [facility] CATH LAB;  Service: Cardiovascular;  Laterality: N/A;  . Cholecystectomy      There were no vitals filed for  this visit.  Visit Diagnosis:  Midline low back pain without sciatica  Weakness of both hips  Difficulty walking  Abnormal posture  Abnormality of gait      Subjective Assessment - 03/24/15 1406    Subjective pt with c/o several year history of Lower Back Pain.  She recently moved to the area and states she had participated in PT in the past prior to moving here.  She states of the 2 bouts of PT, one seemed to increase her back pain and the other attempted to increase LE strength without benefit. She states over the years her back pain has increased.   Pertinent History L THA 2007   How long can you walk comfortably? limited to 30 minutes with use of grocery chart as AD   Patient Stated Goals be able to walk and shop without pain   Currently in Pain? Yes   Pain Score --  AVG pain 5/10 and worst pain up to 8/10 lately   Pain Location Back   Pain Orientation Lower   Pain Descriptors / Indicators Nagging;Constant  constant nagging pain when up on feet   Pain Onset More than a month ago   Pain Frequency Intermittent   Aggravating Factors  standing and walking  Pain Relieving Factors sitting, supine lying   Effect of Pain on Daily Activities limited standing / walking tolerance            [facility] PT Assessment - 03/24/15 0001    Assessment   Medical Diagnosis chronic LBP   Referring Provider Consuella Lose, MD   Onset Date/Surgical Date 02/06/05   Balance Screen   Has the patient fallen in the past 6 months No   Has the patient had a decrease in activity level because of a fear of falling?  No   Is the patient reluctant to leave their home because of a fear of falling?  No   Prior Function   Vocation Retired   Leisure enjoys dancing, working in the yard, and shopping but unable currently due to pain; currently limted to sedentary activities: reading and TV   Observation/Other Assessments   Focus on Therapeutic Outcomes (FOTO)  67% limitation   Posture/Postural Control    Posture Comments scoliosis with L-spine concave R and T-spine concave L, R pelvis appears high vs L in standing   ROM / Strength   AROM / PROM / Strength AROM;Strength   Strength   Overall Strength Comments Unable to assess B Hip Ext, ABD, or ADD in standard testing positions   Strength Assessment Site Knee;Hip;Ankle   Right/Left Hip Right;Left   Right Hip Flexion 4/5   Right Hip Extension 3-/5  based on inability to perform bridge   Right Hip External Rotation  4-/5   Right Hip Internal Rotation 4/5   Right Hip ABduction 3-/5  manual resistance in supine   Right Hip ADduction 3-/5  manual resistance in supine   Left Hip Flexion 3-/5   Left Hip Extension 3-/5  based on inability to perform bridge   Left Hip External Rotation 3+/5   Left Hip Internal Rotation 3+/5   Left Hip ABduction 2+/5  manual resistance in supine   Left Hip ADduction 2+/5  manual resistance in supine   Right/Left Knee Right;Left   Right Knee Flexion 4+/5   Right Knee Extension 4+/5   Left Knee Flexion 4+/5   Left Knee Extension 4-/5   Right/Left Ankle Right;Left   Left Ankle Dorsiflexion --         TODAY'S TREATMENT TherEx - Hooklying B Hip ABD Yellow TB 6x Hooklying Hip ADD Yellow TB 6x each (very difficult on L) Hooklying LE March 3x each with TrA (difficult on L) Seated Low Row Green TB for trunk stability control         PT Education - 03/24/15 1609    Education provided Yes   Education Details initial HEP   Person(s) Educated Patient   Methods Explanation;Demonstration;Handout   Comprehension Verbalized understanding;Returned demonstration          PT Short Term Goals - 03/24/15 1611    PT SHORT TERM GOAL #1   Title pt independent with initial HEP by 04/05/15   Status New           PT Long Term Goals - 03/24/15 1612    PT LONG TERM GOAL #1   Title pt reports decreased LBP to 2/10 or less on average by 05/05/15   Status New   PT LONG TERM GOAL #2   Title pt displays  B Hip MMT 4/5 or better grossly by 05/05/15   Status New   PT LONG TERM GOAL #3   Title pt able to stand and ambulate up to 60 minutes without limitation  by LBP or LE weakness by 05/05/15   Status New   PT LONG TERM GOAL #4   Title pt able to perform transfers (sit->stand and car transfers) with minimal to no need for UE use by 05/05/15   Status New               Plan - 01-Apr-2015 1427    Clinical Impression Statement pt sent to OPPT due to chronic LBP which has been increasing in intensity lately.  Pt states her lower back pain is located in lower aspect of central l-spine and is increased with standing and with walking rating pain 5-8/10 with activity.  She states she is only able to walk for up to 30 minutes in grocery store even with using shopping cart as walking aide due to LBP.  Today's assessment reveals variety of issues.  In standing pt's posutre includes R pelvis high vs L and scoliosis throughout spine which is concave Right in l-spine and concave Left in t-spine.  She displays extensive weakness to B LE with Left worse than Right and proximal much worse than distal with B hips ranging 3-/5 to 4/5 on the Right and 2+/5 to 3+/5 on the Left.  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  Regarding LE weakness, pt requires UE with sit->stand transfers and requires UE assist to lift legs into car and into bed.   Pt will benefit from skilled therapeutic intervention in order to improve on the following deficits Pain;Decreased strength;Decreased mobility;Decreased balance;Decreased activity tolerance;Abnormal gait;Difficulty walking   Rehab Potential Fair   Clinical Impairments Affecting Rehab Potential chronic condition, age, exceptional weakness/decontitioning vs possible diagnosis of limb-girdle muscular dystrophy   PT Frequency 2x / week    PT Duration 6 weeks   PT Treatment/Interventions Therapeutic exercise;Manual techniques;Therapeutic activities;Functional mobility training;Electrical Stimulation;Moist Heat;Cryotherapy;Gait training;Patient/family education;Neuromuscular re-education   PT Next Visit Plan Lumbopelvic stability training as tolerated; Hip stretngthening as able   Consulted and Agree with Plan of Care Patient          G-Codes - 04-01-15 1419    Functional Assessment Tool Used FOTO 67% limitation   Functional Limitation Mobility: Walking and moving around   Mobility: Walking and Moving Around Current Status [phone]) At least 60 percent but less than 80 percent impaired, limited or restricted   Mobility: Walking and Moving Around Goal Status [phone]) At least 40 percent but less than 60 percent impaired, limited or restricted       Problem List Patient Active Problem List   Diagnosis Date Noted  . Brain aneurysm 01/16/2015  . Hyperlipidemia, mild 10/08/2014  . Osteopenia 10/08/2014  . Abdominal pain 08/25/2014  . Peripheral edema 08/25/2014  . Dehydration 08/25/2014  . History of DVT (deep vein thrombosis) 08/14/2014  . Anemia   . GERD (gastroesophageal reflux disease)   . Bilateral dry eyes   . History of chicken pox   . Adiposity 07/28/2013  . Esophagitis 09/13/2010  . Shortness of breath 06/16/2010  . Chest pain, unspecified 06/16/2010  . Osteoarthrosis involving more than one site but not generalized 05/30/2010  . Aortic valve disorder 12/01/2008  . CAROTID STENOSIS 12/01/2008  . HIP PAIN 12/01/2008  . HAMMER TOE 11/27/2008  . Palpitations 11/27/2008  . H/O cataract extraction 03/15/2007  . Benign essential HTN 04/17/2006  . Buzzing  in ear 04/12/2006  . History of artificial joint 03/17/2005  . Leg pain 10/17/2004  . Arthritis, degenerative 05/21/2002  . Chronic venous insufficiency 05/21/2002  . Bone/cartilage disorder 04/26/2001  . 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

## 2015-03-24 NOTE — Telephone Encounter (Signed)
Can be reached: [phone] Pharmacy:  [facility], [place] [phone] (Phone) [phone] (Fax)         Reason for call: CARTIA XT 240 MG 24 hr capsule [id number] request #[id number]

## 2015-03-25 MED ORDER — DILTIAZEM HCL ER COATED BEADS 240 MG PO CP24
240.0000 mg | ORAL_CAPSULE | Freq: Every day | ORAL | Status: DC
Start: 2015-03-25 — End: 2015-09-17

## 2015-03-25 NOTE — Telephone Encounter (Signed)
Let me know and I can send in as she is out. Thanks,

## 2015-03-25 NOTE — Telephone Encounter (Signed)
PCP did ok to fill

## 2015-03-25 NOTE — Telephone Encounter (Signed)
Spoke to the patient and Dr. Alfonse Spruce (her PCP from [place].) had previously filled the [place].  She had plenty when moving here to [place], but is now out and needs her new PCP to begin filling.  She has only received this script from one MD only, but now just needs Dr. Charlett Blake to take over.

## 2015-03-25 NOTE — Telephone Encounter (Signed)
Pt requesting refill on Cartia XT  this has not been refilled since dec 2015 and was last filled by another provider. Please advise?

## 2015-03-25 NOTE — Addendum Note (Signed)
Addended by: Sharon Seller B on: 03/25/2015 10:17 AM   Modules accepted: Orders

## 2015-03-25 NOTE — Telephone Encounter (Signed)
Just clarify with patient whom she wants prescribing it and make sure she is not getting it from 2 places and taking too much med. If unsure we may need to look at all of her med bottles but I am willing to refill

## 2015-03-31 ENCOUNTER — Telehealth: Payer: Self-pay | Admitting: Family Medicine

## 2015-03-31 ENCOUNTER — Ambulatory Visit (INDEPENDENT_AMBULATORY_CARE_PROVIDER_SITE_OTHER): Payer: Medicare Other | Admitting: *Deleted

## 2015-03-31 DIAGNOSIS — Z86718 Personal history of other venous thrombosis and embolism: Secondary | ICD-10-CM

## 2015-03-31 LAB — POCT INR: INR: 3.1

## 2015-03-31 NOTE — Progress Notes (Signed)
Pre visit review using our clinic review tool, if applicable. No additional management support is needed unless otherwise documented below in the visit note.  INR today 3.1. Pt has recently started taking Robaxin, no other medication changes or antibiotics.   Per Dr. Charlett Blake: Continue current regimen of 5 mg (1 tab) daily except on Mondays take 2.5 mg (0.5 tab). Return in 2-3 weeks.  Pt has appt w/ Dr. Charlett Blake 04/12/15, will plan to check INR at appt.  Dorrene German, RN

## 2015-03-31 NOTE — Progress Notes (Signed)
RN note reviewed. Agree with documention and plan. 
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
## 2015-03-31 NOTE — Telephone Encounter (Signed)
Pt says that she is starting to have frequent headaches . Pt would like a referral to [facility] neurology . Phone: [phone]

## 2015-03-31 NOTE — Patient Instructions (Signed)
Per Dr. Charlett Blake: Continue current regimen of 5 mg (1 tab) daily except on Mondays take 2.5 mg (0.5 tab). Return in 2-3 weeks.

## 2015-04-01 ENCOUNTER — Encounter: Payer: Self-pay | Admitting: Family Medicine

## 2015-04-01 ENCOUNTER — Ambulatory Visit (INDEPENDENT_AMBULATORY_CARE_PROVIDER_SITE_OTHER): Payer: Medicare Other | Admitting: Family Medicine

## 2015-04-01 VITALS — BP 128/88 | HR 72 | Temp 98.5°F | Ht 65.0 in | Wt 176.1 lb

## 2015-04-01 DIAGNOSIS — I10 Essential (primary) hypertension: Secondary | ICD-10-CM

## 2015-04-01 DIAGNOSIS — F0781 Postconcussional syndrome: Secondary | ICD-10-CM | POA: Insufficient documentation

## 2015-04-01 DIAGNOSIS — M542 Cervicalgia: Secondary | ICD-10-CM | POA: Insufficient documentation

## 2015-04-01 HISTORY — DX: Postconcussional syndrome: F07.81

## 2015-04-01 MED ORDER — TRAMADOL HCL 50 MG PO TABS: 50.0000 mg | ORAL_TABLET | Freq: Three times a day (TID) | ORAL | Status: DC | PRN

## 2015-04-01 NOTE — Patient Instructions (Signed)
Post-Concussion Syndrome  Post-concussion syndrome describes the symptoms that can occur after a head injury. These symptoms can last from weeks to months.  CAUSES   It is not clear why some head injuries cause post-concussion syndrome. It can occur whether your head injury was mild or severe and whether you were wearing head protection or not.   SIGNS AND SYMPTOMS  · Memory difficulties.  · Dizziness.  · Headaches.  · Double vision or blurry vision.  · Sensitivity to light.  · Hearing difficulties.  · Depression.  · Tiredness.  · Weakness.  · Difficulty with concentration.  · Difficulty sleeping or staying asleep.  · Vomiting.  · Poor balance or instability on your feet.  · Slow reaction time.  · Difficulty learning and remembering things you have heard.  DIAGNOSIS   There is no test to determine whether you have post-concussion syndrome. Your health care provider may order an imaging scan of your brain, such as a CT scan, to check for other problems that may be causing your symptoms (such as a severe injury inside your skull).  TREATMENT   Usually, these problems disappear over time without medical care. Your health care provider may prescribe medicine to help ease your symptoms. It is important to follow up with a neurologist to evaluate your recovery and address any lingering symptoms or issues.  HOME CARE INSTRUCTIONS   · Take medicines only as directed by your health care provider. Do not take aspirin. Aspirin can slow blood clotting.  · Sleep with your head slightly elevated to help with headaches.  · Avoid any situation where there is potential for another head injury. This includes football, hockey, soccer, basketball, martial arts, downhill snow sports, and horseback riding. Your condition will get worse every time you experience a concussion. You should avoid these activities until you are evaluated by the appropriate follow-up health care providers.  · Keep all follow-up visits as directed by your health  care provider. This is important.  SEEK MEDICAL CARE IF:  · You have increased problems paying attention or concentrating.  · You have increased difficulty remembering or learning new information.  · You need more time to complete tasks or assignments than before.  · You have increased irritability or decreased ability to cope with stress.  · You have more symptoms than before.  Seek medical care if you have any of the following symptoms for more than two weeks after your injury:  · Lasting (chronic) headaches.  · Dizziness or balance problems.  · Nausea.  · Vision problems.  · Increased sensitivity to noise or light.  · Depression or mood swings.  · Anxiety or irritability.  · Memory problems.  · Difficulty concentrating or paying attention.  · Sleep problems.  · Feeling tired all the time.  SEEK IMMEDIATE MEDICAL CARE IF:  · You have confusion or unusual drowsiness.  · Others find it difficult to wake you up.  · You have nausea or persistent, forceful vomiting.  · You feel like you are moving when you are not (vertigo). Your eyes may move rapidly back and forth.  · You have convulsions or faint.  · You have severe, persistent headaches that are not relieved by medicine.  · You cannot use your arms or legs normally.  · One of your pupils is larger than the other.  · You have clear or bloody discharge from your nose or ears.  · Your problems are getting worse, not better.  MAKE   SURE YOU:  · Understand these instructions.  · Will watch your condition.  · Will get help right away if you are not doing well or get worse.     This information is not intended to replace advice given to you by your health care provider. Make sure you discuss any questions you have with your health care provider.     Document Released: 07/15/2001 Document Revised: 02/13/2014 Document Reviewed: 04/30/2013  Elsevier Interactive Patient Education ©2016 [employer]
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
## 2015-04-01 NOTE — Assessment & Plan Note (Signed)
Well controlled, no changes to meds. Encouraged heart healthy diet such as the DASH diet and exercise as tolerated.  °
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
## 2015-04-01 NOTE — Progress Notes (Signed)
Pre visit review using our clinic review tool, if applicable. No additional management support is needed unless otherwise documented below in the visit note. 

## 2015-04-06 ENCOUNTER — Other Ambulatory Visit: Payer: Self-pay | Admitting: Family Medicine

## 2015-04-07 ENCOUNTER — Ambulatory Visit: Payer: Medicare Other | Attending: Neurosurgery | Admitting: Physical Therapy

## 2015-04-07 DIAGNOSIS — M6289 Other specified disorders of muscle: Secondary | ICD-10-CM | POA: Insufficient documentation

## 2015-04-07 DIAGNOSIS — R269 Unspecified abnormalities of gait and mobility: Secondary | ICD-10-CM | POA: Diagnosis not present

## 2015-04-07 DIAGNOSIS — R293 Abnormal posture: Secondary | ICD-10-CM | POA: Diagnosis not present

## 2015-04-07 DIAGNOSIS — M545 Low back pain, unspecified: Secondary | ICD-10-CM

## 2015-04-07 DIAGNOSIS — R262 Difficulty in walking, not elsewhere classified: Secondary | ICD-10-CM | POA: Insufficient documentation

## 2015-04-07 DIAGNOSIS — R29898 Other symptoms and signs involving the musculoskeletal system: Secondary | ICD-10-CM

## 2015-04-07 NOTE — Therapy (Signed)
[facility] [address]  [place], [place], [address] Phone: [phone]   Fax:  [phone]  Physical Therapy Treatment  Patient Details  Name: Mikayla [name] MRN: [id number] Date of Birth: 1940-12-20 Referring Provider: Consuella Lose, MD  Encounter Date: 04/07/2015      PT End of Session - 04/07/15 1409    Visit Number 2   Number of Visits 12   Date for PT Re-Evaluation 05/05/15   PT Start Time [id number]   PT Stop Time 1455   PT Time Calculation (min) 47 min      Past Medical History  Diagnosis Date  . Acute venous embolism and thrombosis of unspecified deep vessels of lower extremity     after broken foot screw left in place, left leg  . Aortic stenosis     echo 11/11: EF 60%, mild LVH, mod/severe AS with mean 32 mmHg, peak 55 mmHg, mild AI, mild MR, (previous echo 2010 with mean gradient 29 mmHg)  . Carotid stenosis     dopplers 11/11: 0-39% bilat  . Palpitations     PACs and atrial tachy  . Other congenital anomaly of toes   . Pain in joint, pelvic region and thigh   . Atrial tachycardia ([place])   . Dyslipidemia   . Anemia   . GERD (gastroesophageal reflux disease)   . Bilateral dry eyes   . Hyperlipidemia   . History of chicken pox   . H/O measles   . Abdominal pain 08/25/2014  . Peripheral edema 08/25/2014  . Dehydration 08/25/2014  . Hyperlipidemia, mild 10/08/2014  . Osteopenia 10/08/2014  . Brain aneurysm 01/16/2015    Past Surgical History  Procedure Laterality Date  . Tonsillectomy    . Radical hysterectomy    . Foot surgery    . Left hip replacement    . Hyroglossal duct cyst resection    . Left and right heart catheterization with coronary angiogram N/A 03/21/2013    Procedure: LEFT AND RIGHT HEART CATHETERIZATION WITH CORONARY ANGIOGRAM;  Surgeon: Minus Breeding, MD;  Location: [facility] CATH LAB;  Service: Cardiovascular;  Laterality: N/A;  . Cholecystectomy      There were no vitals filed for  this visit.  Visit Diagnosis:  Midline low back pain without sciatica  Weakness of both hips  Difficulty walking  Abnormal posture  Abnormality of gait      Subjective Assessment - 04/07/15 1409    Subjective States feels sore across lower back rating 7/10 currently. States some days are better than others and states today seems to be a bad day.  She doesn't know reason for pain but states "could be the exercises from here"; however, denies noting pain during exercises.   Currently in Pain? Yes   Pain Score 7    Pain Location Back   Pain Orientation Lower         TODAY'S TREATMENT TherEx - Hooklying B Hip ABD Blue TB 12x Hooklying Unilateral Hip ADD Blue TB 12x each Hooklying March 6x each (difficult) Attempted Bridge but unable to perform due to weakness, attempted to perform with assistance but c/o LBP with this Hooklying LTR 30" Seated Knee Flexion Blue TB 8x each (difficult) Seated LAQ 3# 8x each (difficult) Seated Trunk isometrics 2x5" FW, BW, and B Rotation with very light resistance but still notes some LBP and difficulty with resisting Seated B Shoulder Flexion while attempting to maintain upright posture 8x to 80-90 dg (difficult, notes lower  back strain) Seated B Low Row Blue TB 8x (lower back strain and mild pain) Seated Single Hand Low Row Green TB 8x each (no LBP)           PT Short Term Goals - 04/07/15 1504    PT SHORT TERM GOAL #1   Title pt independent with initial HEP by 04/05/15   Status Achieved           PT Long Term Goals - 04/07/15 1504    PT LONG TERM GOAL #1   Title pt reports decreased LBP to 2/10 or less on average by 05/05/15   Status On-going   PT LONG TERM GOAL #2   Title pt displays B Hip MMT 4/5 or better grossly by 05/05/15   Status On-going   PT LONG TERM GOAL #3   Title pt able to stand and ambulate up to 60 minutes without limitation by LBP or LE weakness by 05/05/15   Status On-going   PT LONG TERM GOAL #4   Title pt  able to perform transfers (sit->stand and car transfers) with minimal to no need for UE use by 05/05/15   Status On-going               Plan - 04/07/15 1502    Clinical Impression Statement pt is extremely weak in her hips and trunk muscles which may be 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  Limited tolerance with exercises.  We will progress as tolerated but prognosis remains quite guarded.   PT Next Visit Plan Lumbopelvic stability training as tolerated; Hip stretngthening as able   Consulted and Agree with Plan of Care Patient        Problem List Patient Active Problem List   Diagnosis Date Noted  . Postconcussive syndrome 04/01/2015  . Neck pain on left side 04/01/2015  . Brain aneurysm 01/16/2015  . Hyperlipidemia, mild 10/08/2014  . Osteopenia 10/08/2014  . Abdominal pain 08/25/2014  . Peripheral edema 08/25/2014  . Dehydration 08/25/2014  . History of DVT (deep vein thrombosis) 08/14/2014  . Anemia   . GERD (gastroesophageal reflux disease)   . Bilateral dry eyes   . History of chicken pox   . Adiposity 07/28/2013  . Esophagitis 09/13/2010  . Shortness of breath 06/16/2010  . Chest pain, unspecified 06/16/2010  . Osteoarthrosis involving more than one site but not generalized 05/30/2010  . Aortic valve disorder 12/01/2008  . CAROTID STENOSIS 12/01/2008  . HIP PAIN 12/01/2008  . HAMMER TOE 11/27/2008  . Palpitations 11/27/2008  . H/O cataract extraction 03/15/2007  . Benign essential HTN 04/17/2006  . Buzzing in ear 04/12/2006  . History of artificial joint 03/17/2005  . Leg pain 10/17/2004  . Arthritis, degenerative 05/21/2002  . Chronic venous insufficiency 05/21/2002  . Bone/cartilage disorder 04/26/2001  . 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 [address]  [place],  [place], [address] Phone: [phone]   Fax:  [phone]  Name: Mikayla [name] MRN: [id number] Date of Birth: 12/01/1940

## 2015-04-12 ENCOUNTER — Ambulatory Visit: Payer: Medicare Other | Admitting: Family Medicine

## 2015-04-13 DIAGNOSIS — G44309 Post-traumatic headache, unspecified, not intractable: Secondary | ICD-10-CM | POA: Diagnosis not present

## 2015-04-13 DIAGNOSIS — F0781 Postconcussional syndrome: Secondary | ICD-10-CM | POA: Diagnosis not present

## 2015-04-14 ENCOUNTER — Ambulatory Visit: Payer: Medicare Other | Admitting: Physical Therapy

## 2015-04-19 ENCOUNTER — Ambulatory Visit: Payer: Medicare Other | Admitting: Physical Therapy

## 2015-04-19 DIAGNOSIS — R262 Difficulty in walking, not elsewhere classified: Secondary | ICD-10-CM | POA: Diagnosis not present

## 2015-04-19 DIAGNOSIS — M6289 Other specified disorders of muscle: Secondary | ICD-10-CM | POA: Diagnosis not present

## 2015-04-19 DIAGNOSIS — R269 Unspecified abnormalities of gait and mobility: Secondary | ICD-10-CM | POA: Diagnosis not present

## 2015-04-19 DIAGNOSIS — R293 Abnormal posture: Secondary | ICD-10-CM | POA: Diagnosis not present

## 2015-04-19 DIAGNOSIS — R29898 Other symptoms and signs involving the musculoskeletal system: Secondary | ICD-10-CM

## 2015-04-19 DIAGNOSIS — M545 Low back pain, unspecified: Secondary | ICD-10-CM

## 2015-04-19 NOTE — Therapy (Signed)
[facility] [address]  [place], [place], [address] Phone: [phone]   Fax:  [phone]  Physical Therapy Treatment  Patient Details  Name: Mikayla [name] MRN: [id number] Date of Birth: 16-Aug-1940 Referring Provider: Consuella Lose, MD  Encounter Date: 04/19/2015      PT End of Session - 04/19/15 1402    Visit Number 3   Number of Visits 12   Date for PT Re-Evaluation 05/05/15   PT Start Time 1318   PT Stop Time 1400   PT Time Calculation (min) 42 min      Past Medical History  Diagnosis Date  . Acute venous embolism and thrombosis of unspecified deep vessels of lower extremity     after broken foot screw left in place, left leg  . Aortic stenosis     echo 11/11: EF 60%, mild LVH, mod/severe AS with mean 32 mmHg, peak 55 mmHg, mild AI, mild MR, (previous echo 2010 with mean gradient 29 mmHg)  . Carotid stenosis     dopplers 11/11: 0-39% bilat  . Palpitations     PACs and atrial tachy  . Other congenital anomaly of toes   . Pain in joint, pelvic region and thigh   . Atrial tachycardia ([place])   . Dyslipidemia   . Anemia   . GERD (gastroesophageal reflux disease)   . Bilateral dry eyes   . Hyperlipidemia   . History of chicken pox   . H/O measles   . Abdominal pain 08/25/2014  . Peripheral edema 08/25/2014  . Dehydration 08/25/2014  . Hyperlipidemia, mild 10/08/2014  . Osteopenia 10/08/2014  . Brain aneurysm 01/16/2015    Past Surgical History  Procedure Laterality Date  . Tonsillectomy    . Radical hysterectomy    . Foot surgery    . Left hip replacement    . Hyroglossal duct cyst resection    . Left and right heart catheterization with coronary angiogram N/A 03/21/2013    Procedure: LEFT AND RIGHT HEART CATHETERIZATION WITH CORONARY ANGIOGRAM;  Surgeon: Minus Breeding, MD;  Location: [facility] CATH LAB;  Service: Cardiovascular;  Laterality: N/A;  . Cholecystectomy      There were no vitals filed for  this visit.  Visit Diagnosis:  Midline low back pain without sciatica  Weakness of both hips  Difficulty walking  Abnormal posture  Abnormality of gait      Subjective Assessment - 04/19/15 1319    Subjective States lower back is feeling pretty good today stating really only noting pain in R hip currently which she rates 6/10.  Her pain is noted in R upper buttock so still seems related to LBP.   Currently in Pain? Yes   Pain Score 6    Pain Location Back   Pain Orientation Right;Posterior;Lower          TODAY'S TREATMENT TherEx - Hooklying B Hip ABD AROM 10x Hooklying Unilateral Hip ABD AROM with TrA 10x Hooklying Hip Flexion with TrA 8x each (R much easier than L, no pain) Hooklying Unilateral Hip ABD Blue TB 10x each Hooklying Unilateral Hip ADD Blue TB 10x each B FOB (peanut) Tuck AROM 2x10 Hooklying with B LE on Peanut Ball UE reach towards contra knee for slight trunk rotation 8x each B FOB (peanut) ALT SLR 6x each (more difficult on L) Seated LAQ 3# 8x each (difficult B but near full ROM all reps) Seated March with contra UE 6x each (VC/TC for sequencing, more difficult  with L hip flexion vs R) Seated Trunk isometrics 2x5" FW, BW, and B Rotation with mild resistance and no c/o pain - much improved vs last treatment Seated lateral step-out with diagonal reach 5x each [place] between hands B Shoulder Flexion to 90 then 90 degree rotation CW and CCW. Repeated 5x focus on core contraction             PT Short Term Goals - 04/07/15 1504    PT SHORT TERM GOAL #1   Title pt independent with initial HEP by 04/05/15   Status Achieved           PT Long Term Goals - 04/07/15 1504    PT LONG TERM GOAL #1   Title pt reports decreased LBP to 2/10 or less on average by 05/05/15   Status On-going   PT LONG TERM GOAL #2   Title pt displays B Hip MMT 4/5 or better grossly by 05/05/15   Status On-going   PT LONG TERM GOAL #3   Title pt able to stand  and ambulate up to 60 minutes without limitation by LBP or LE weakness by 05/05/15   Status On-going   PT LONG TERM GOAL #4   Title pt able to perform transfers (sit->stand and car transfers) with minimal to no need for UE use by 05/05/15   Status On-going               Plan - 04/19/15 1326    Clinical Impression Statement pt states her lower back pain is not present today but rather is noting pain in R hip/buttock.  She states hip and back pain are both bothered by transfers and standing/walking. Not sure if her c/o R hip pain is truly hip or if related to LBP.  She states back and hip pain are caused by transfers and standing/walking to may be same pain just more distal and to R vs central today.  Either way, performed well today.  States none of the supine/hooklying exercises caused pain and onlly noted mild pain with seated exercises which she states was lower back not hip pain.  She is quite weak but did perform well today and seems willing to push herself.   PT Next Visit Plan Lumbopelvic stability training as tolerated; Hip stretngthening as able   Consulted and Agree with Plan of Care Patient        Problem List Patient Active Problem List   Diagnosis Date Noted  . Postconcussive syndrome 04/01/2015  . Neck pain on left side 04/01/2015  . Brain aneurysm 01/16/2015  . Hyperlipidemia, mild 10/08/2014  . Osteopenia 10/08/2014  . Abdominal pain 08/25/2014  . Peripheral edema 08/25/2014  . Dehydration 08/25/2014  . History of DVT (deep vein thrombosis) 08/14/2014  . Anemia   . GERD (gastroesophageal reflux disease)   . Bilateral dry eyes   . History of chicken pox   . Adiposity 07/28/2013  . Esophagitis 09/13/2010  . Shortness of breath 06/16/2010  . Chest pain, unspecified 06/16/2010  . Osteoarthrosis involving more than one site but not generalized 05/30/2010  . Aortic valve disorder 12/01/2008  . CAROTID STENOSIS 12/01/2008  . HIP PAIN 12/01/2008  . HAMMER TOE  11/27/2008  . Palpitations 11/27/2008  . H/O cataract extraction 03/15/2007  . Benign essential HTN 04/17/2006  . Buzzing in ear 04/12/2006  . History of artificial joint 03/17/2005  . Leg pain 10/17/2004  . Arthritis, degenerative 05/21/2002  . Chronic venous insufficiency 05/21/2002  .  Bone/cartilage disorder 04/26/2001  . 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  [place], [place], [address] Phone: [phone]   Fax:  [phone]  Name: Salma Rus MRN: [id number] Date of Birth: Oct 10, 1940

## 2015-04-21 ENCOUNTER — Ambulatory Visit: Payer: Medicare Other | Admitting: Physical Therapy

## 2015-04-21 ENCOUNTER — Ambulatory Visit (INDEPENDENT_AMBULATORY_CARE_PROVIDER_SITE_OTHER): Payer: Medicare Other | Admitting: Behavioral Health

## 2015-04-21 ENCOUNTER — Ambulatory Visit: Payer: Medicare Other

## 2015-04-21 DIAGNOSIS — Z86718 Personal history of other venous thrombosis and embolism: Secondary | ICD-10-CM | POA: Diagnosis not present

## 2015-04-21 LAB — POCT INR: INR: 2.7

## 2015-04-21 NOTE — Progress Notes (Signed)
Pre visit review using our clinic review tool, if applicable. No additional management support is needed unless otherwise documented below in the visit note.  Patient presents in office for INR check. Today's reading was 2.7. Per Saguier, PA-C: Continue current regimen of 5 mg (1 tab) daily except on Mondays take 2.5 mg (0.5 tab). Return in 3 weeks. Informed patient of the provider's instructions. She understood and did not have any questions or concerns prior to leaving the visit.

## 2015-04-21 NOTE — Patient Instructions (Signed)
Per Saguier, PA-C: Continue current regimen of 5 mg (1 tab) daily except on Mondays take 2.5 mg (0.5 tab). Return in 3 weeks.

## 2015-04-26 ENCOUNTER — Other Ambulatory Visit: Payer: Self-pay | Admitting: Family Medicine

## 2015-04-26 ENCOUNTER — Ambulatory Visit: Payer: Medicare Other | Admitting: Physical Therapy

## 2015-04-26 DIAGNOSIS — R293 Abnormal posture: Secondary | ICD-10-CM

## 2015-04-26 DIAGNOSIS — M6289 Other specified disorders of muscle: Secondary | ICD-10-CM | POA: Diagnosis not present

## 2015-04-26 DIAGNOSIS — R262 Difficulty in walking, not elsewhere classified: Secondary | ICD-10-CM

## 2015-04-26 DIAGNOSIS — M545 Low back pain, unspecified: Secondary | ICD-10-CM

## 2015-04-26 DIAGNOSIS — R269 Unspecified abnormalities of gait and mobility: Secondary | ICD-10-CM

## 2015-04-26 DIAGNOSIS — R29898 Other symptoms and signs involving the musculoskeletal system: Secondary | ICD-10-CM

## 2015-04-26 NOTE — Therapy (Signed)
[facility] [address]  [place], [place], [address] Phone: [phone]   Fax:  [phone]  Physical Therapy Treatment  Patient Details  Name: Mikayla [name] MRN: [id number] Date of Birth: 1940-06-01 Referring Provider: Consuella Lose, MD  Encounter Date: 04/26/2015      PT End of Session - 04/26/15 1446    Visit Number 4   Number of Visits 12   Date for PT Re-Evaluation 05/05/15   PT Start Time 1401   PT Stop Time 1446   PT Time Calculation (min) 45 min      Past Medical History  Diagnosis Date  . Acute venous embolism and thrombosis of unspecified deep vessels of lower extremity     after broken foot screw left in place, left leg  . Aortic stenosis     echo 11/11: EF 60%, mild LVH, mod/severe AS with mean 32 mmHg, peak 55 mmHg, mild AI, mild MR, (previous echo 2010 with mean gradient 29 mmHg)  . Carotid stenosis     dopplers 11/11: 0-39% bilat  . Palpitations     PACs and atrial tachy  . Other congenital anomaly of toes   . Pain in joint, pelvic region and thigh   . Atrial tachycardia ([place])   . Dyslipidemia   . Anemia   . GERD (gastroesophageal reflux disease)   . Bilateral dry eyes   . Hyperlipidemia   . History of chicken pox   . H/O measles   . Abdominal pain 08/25/2014  . Peripheral edema 08/25/2014  . Dehydration 08/25/2014  . Hyperlipidemia, mild 10/08/2014  . Osteopenia 10/08/2014  . Brain aneurysm 01/16/2015    Past Surgical History  Procedure Laterality Date  . Tonsillectomy    . Radical hysterectomy    . Foot surgery    . Left hip replacement    . Hyroglossal duct cyst resection    . Left and right heart catheterization with coronary angiogram N/A 03/21/2013    Procedure: LEFT AND RIGHT HEART CATHETERIZATION WITH CORONARY ANGIOGRAM;  Surgeon: Minus Breeding, MD;  Location: [facility] CATH LAB;  Service: Cardiovascular;  Laterality: N/A;  . Cholecystectomy      There were no vitals filed for  this visit.  Visit Diagnosis:  Midline low back pain without sciatica  Weakness of both hips  Difficulty walking  Abnormal posture  Abnormality of gait      Subjective Assessment - 04/26/15 1407    Subjective States is doing pretty good today and then rates pain 6-7/10. States still cannot go shopping due to standing/walking limitation   Currently in Pain? Yes   Pain Score --  6-7/10   Pain Location Back   Pain Orientation Lower           TODAY'S TREATMENT TherEx - NuStep lvl 4, 3' B Knee Flexion Machine 15# 10x Standing Hip ABD AROM 10x each Low Row Machine 10# 10x Seated LAQ 3# 10x each (full ROM all reps with R, difficult with last couple on L) Seated March with contra UE 6x each, 3# on ankles (VC/TC for sequencing, performed well with R LE, great difficulty on L) Hooklying Unilateral Hip ABD with Yellow TB and TrA 10x each Hooklying Hip Flexion with TrA 10x each (difficult on L) B FOB (peanut) Tuck AROM 2x12 B FOB (peanut) ALT SLR 8x each (more difficult on L) Diagonal curl-up 12x each Hooklying Unilateral Hip ADD Yellow TB 10x each Seated Trunk isometrics 3x5" FW, BW, and B  Rotation with moderate resistance and no c/o pain         PT Short Term Goals - 04/07/15 1504    PT SHORT TERM GOAL #1   Title pt independent with initial HEP by 04/05/15   Status Achieved           PT Long Term Goals - 04/07/15 1504    PT LONG TERM GOAL #1   Title pt reports decreased LBP to 2/10 or less on average by 05/05/15   Status On-going   PT LONG TERM GOAL #2   Title pt displays B Hip MMT 4/5 or better grossly by 05/05/15   Status On-going   PT LONG TERM GOAL #3   Title pt able to stand and ambulate up to 60 minutes without limitation by LBP or LE weakness by 05/05/15   Status On-going   PT LONG TERM GOAL #4   Title pt able to perform transfers (sit->stand and car transfers) with minimal to no need for UE use by 05/05/15   Status On-going                Plan - 04/26/15 1447    Clinical Impression Statement pt still very weak in B hips with L weaker than R.  Progressing intensity / duration(reps) of exercises as able but this is ver slow to date.   PT Next Visit Plan Lumbopelvic stability training as tolerated; Hip stretngthening as able   Consulted and Agree with Plan of Care Patient        Problem List Patient Active Problem List   Diagnosis Date Noted  . Postconcussive syndrome 04/01/2015  . Neck pain on left side 04/01/2015  . Brain aneurysm 01/16/2015  . Hyperlipidemia, mild 10/08/2014  . Osteopenia 10/08/2014  . Abdominal pain 08/25/2014  . Peripheral edema 08/25/2014  . Dehydration 08/25/2014  . History of DVT (deep vein thrombosis) 08/14/2014  . Anemia   . GERD (gastroesophageal reflux disease)   . Bilateral dry eyes   . History of chicken pox   . Adiposity 07/28/2013  . Esophagitis 09/13/2010  . Shortness of breath 06/16/2010  . Chest pain, unspecified 06/16/2010  . Osteoarthrosis involving more than one site but not generalized 05/30/2010  . Aortic valve disorder 12/01/2008  . CAROTID STENOSIS 12/01/2008  . HIP PAIN 12/01/2008  . HAMMER TOE 11/27/2008  . Palpitations 11/27/2008  . H/O cataract extraction 03/15/2007  . Benign essential HTN 04/17/2006  . Buzzing in ear 04/12/2006  . History of artificial joint 03/17/2005  . Leg pain 10/17/2004  . Arthritis, degenerative 05/21/2002  . Chronic venous insufficiency 05/21/2002  . Bone/cartilage disorder 04/26/2001  . 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  [place], [place], [address] Phone: [phone]   Fax:  [phone]  Name: Mikayla [name] MRN: [id number] Date of Birth: 1940/07/02

## 2015-04-28 ENCOUNTER — Ambulatory Visit: Payer: Medicare Other | Admitting: Physical Therapy

## 2015-04-28 DIAGNOSIS — M6289 Other specified disorders of muscle: Secondary | ICD-10-CM | POA: Diagnosis not present

## 2015-04-28 DIAGNOSIS — M545 Low back pain, unspecified: Secondary | ICD-10-CM

## 2015-04-28 DIAGNOSIS — R262 Difficulty in walking, not elsewhere classified: Secondary | ICD-10-CM | POA: Diagnosis not present

## 2015-04-28 DIAGNOSIS — R269 Unspecified abnormalities of gait and mobility: Secondary | ICD-10-CM

## 2015-04-28 DIAGNOSIS — R293 Abnormal posture: Secondary | ICD-10-CM | POA: Diagnosis not present

## 2015-04-28 DIAGNOSIS — R29898 Other symptoms and signs involving the musculoskeletal system: Secondary | ICD-10-CM

## 2015-04-28 NOTE — Therapy (Signed)
[facility] [address]  [place], [place], [address] Phone: [phone]   Fax:  [phone]  Physical Therapy Treatment  Patient Details  Name: Mikayla [name] MRN: [id number] Date of Birth: 1940-06-06 Referring Provider: Consuella Lose, MD  Encounter Date: 04/28/2015      PT End of Session - 04/28/15 1407    Visit Number 5   Number of Visits 12   Date for PT Re-Evaluation 05/05/15   PT Start Time [id number]   PT Stop Time 1452   PT Time Calculation (min) 47 min      There were no vitals filed for this visit.  Visit Diagnosis:  Midline low back pain without sciatica  Weakness of both hips  Difficulty walking  Abnormal posture  Abnormality of gait      Subjective Assessment - 04/28/15 1407    Subjective Rates LBP 5/10 currently but states is going to grocery store later today and anticipates increased pain with this.   Currently in Pain? Yes   Pain Score 5    Pain Location Back   Pain Orientation Lower              TODAY'S TREATMENT TherEx - NuStep lvl 4, 4' Seated LAQ 4# 10x each (more difficult on L vs R) Seated March with contra UE 6x each, 4# on ankles (VC/TC for sequencing, performed well with R LE, great difficulty on L) Seated Trunk isometrics 3x5" FW, BW, and B Rotation with moderate resistance and no c/o pain Seated Unilateral Hip ADD Blue TB 8x each Seated B Hip ABD Blue TB 10 (minimal ROM, too heavy) Seated Fitter Leg Press 1 Blue 10x each, 2 Blue 15x each 6" Step Toe-Tapping with single pole A 8x each 4" ALT Lateral Step-ups 2x each with B Pole A (very difficult with stepping up with L) 4" ALT FW Step-up 2x each with B Pole A (very difficult with stepping up with L) ALLow Row Machine 10# 10x, 15# 10x Single Hand Low Row 5# 10x eachT Knee Flexion Machine 5# 10x each Low Row Machine 10# 10x, 15# 10x Single Hand Low Row 5# 10x each Side-Stepping 1 lap then 2 more laps with Yellow TB  at ankles, counter assist         PT Short Term Goals - 04/07/15 1504    PT SHORT TERM GOAL #1   Title pt independent with initial HEP by 04/05/15   Status Achieved           PT Long Term Goals - 04/07/15 1504    PT LONG TERM GOAL #1   Title pt reports decreased LBP to 2/10 or less on average by 05/05/15   Status On-going   PT LONG TERM GOAL #2   Title pt displays B Hip MMT 4/5 or better grossly by 05/05/15   Status On-going   PT LONG TERM GOAL #3   Title pt able to stand and ambulate up to 60 minutes without limitation by LBP or LE weakness by 05/05/15   Status On-going   PT LONG TERM GOAL #4   Title pt able to perform transfers (sit->stand and car transfers) with minimal to no need for UE use by 05/05/15   Status On-going               Plan - 04/28/15 1457    Clinical Impression Statement Attempted some step training exercises today but pt had great difficulty with L LE. She was  unable to lift L LE onto step at times and she became frustrated at one point causing brief tearing up.  Otherwise, she remains very motivated and attempts everythig asked of her.  We will continue to work towards her goals and improving her overall level of function.   PT Next Visit Plan Lumbopelvic stability training as tolerated; Hip stretngthening as able   Consulted and Agree with Plan of Care Patient        Problem List Patient Active Problem List   Diagnosis Date Noted  . Postconcussive syndrome 04/01/2015  . Neck pain on left side 04/01/2015  . Brain aneurysm 01/16/2015  . Hyperlipidemia, mild 10/08/2014  . Osteopenia 10/08/2014  . Abdominal pain 08/25/2014  . Peripheral edema 08/25/2014  . Dehydration 08/25/2014  . History of DVT (deep vein thrombosis) 08/14/2014  . Anemia   . GERD (gastroesophageal reflux disease)   . Bilateral dry eyes   . History of chicken pox   . Adiposity 07/28/2013  . Esophagitis 09/13/2010  . Shortness of breath 06/16/2010  . Chest pain,  unspecified 06/16/2010  . Osteoarthrosis involving more than one site but not generalized 05/30/2010  . Aortic valve disorder 12/01/2008  . CAROTID STENOSIS 12/01/2008  . HIP PAIN 12/01/2008  . HAMMER TOE 11/27/2008  . Palpitations 11/27/2008  . H/O cataract extraction 03/15/2007  . Benign essential HTN 04/17/2006  . Buzzing in ear 04/12/2006  . History of artificial joint 03/17/2005  . Leg pain 10/17/2004  . Arthritis, degenerative 05/21/2002  . Chronic venous insufficiency 05/21/2002  . Bone/cartilage disorder 04/26/2001  . 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  [place], [place], [address] Phone: [phone]   Fax:  [phone]  Name: Mikayla [name] MRN: [id number] Date of Birth: 30-Jan-1941

## 2015-05-03 ENCOUNTER — Ambulatory Visit: Payer: Medicare Other | Admitting: Physical Therapy

## 2015-05-03 DIAGNOSIS — R269 Unspecified abnormalities of gait and mobility: Secondary | ICD-10-CM | POA: Diagnosis not present

## 2015-05-03 DIAGNOSIS — M545 Low back pain, unspecified: Secondary | ICD-10-CM

## 2015-05-03 DIAGNOSIS — R29898 Other symptoms and signs involving the musculoskeletal system: Secondary | ICD-10-CM

## 2015-05-03 DIAGNOSIS — R293 Abnormal posture: Secondary | ICD-10-CM | POA: Diagnosis not present

## 2015-05-03 DIAGNOSIS — R262 Difficulty in walking, not elsewhere classified: Secondary | ICD-10-CM | POA: Diagnosis not present

## 2015-05-03 DIAGNOSIS — M6289 Other specified disorders of muscle: Secondary | ICD-10-CM | POA: Diagnosis not present

## 2015-05-03 NOTE — Therapy (Signed)
[facility] [address]  [place], [place], [address] Phone: [phone]   Fax:  [phone]  Physical Therapy Treatment  Patient Details  Name: Mikayla [name] MRN: [id number] Date of Birth: [birth year] Referring Provider: Consuella Lose, MD  Encounter Date: 05/03/2015      PT End of Session - 05/03/15 1408    Visit Number 6   Number of Visits 12   Date for PT Re-Evaluation 05/05/15   PT Start Time [id number]   PT Stop Time 1445   PT Time Calculation (min) 40 min      Past Medical History  Diagnosis Date  . Acute venous embolism and thrombosis of unspecified deep vessels of lower extremity     after broken foot screw left in place, left leg  . Aortic stenosis     echo 11/11: EF 60%, mild LVH, mod/severe AS with mean 32 mmHg, peak 55 mmHg, mild AI, mild MR, (previous echo 2010 with mean gradient 29 mmHg)  . Carotid stenosis     dopplers 11/11: 0-39% bilat  . Palpitations     PACs and atrial tachy  . Other congenital anomaly of toes   . Pain in joint, pelvic region and thigh   . Atrial tachycardia ([place])   . Dyslipidemia   . Anemia   . GERD (gastroesophageal reflux disease)   . Bilateral dry eyes   . Hyperlipidemia   . History of chicken pox   . H/O measles   . Abdominal pain 08/25/2014  . Peripheral edema 08/25/2014  . Dehydration 08/25/2014  . Hyperlipidemia, mild 10/08/2014  . Osteopenia 10/08/2014  . Brain aneurysm 01/16/2015    Past Surgical History  Procedure Laterality Date  . Tonsillectomy    . Radical hysterectomy    . Foot surgery    . Left hip replacement    . Hyroglossal duct cyst resection    . Left and right heart catheterization with coronary angiogram N/A 03/21/2013    Procedure: LEFT AND RIGHT HEART CATHETERIZATION WITH CORONARY ANGIOGRAM;  Surgeon: Minus Breeding, MD;  Location: [facility] CATH LAB;  Service: Cardiovascular;  Laterality: N/A;  . Cholecystectomy      There were no vitals filed for  this visit.  Visit Diagnosis:  Midline low back pain without sciatica  Weakness of both hips  Difficulty walking  Abnormal posture  Abnormality of gait      Subjective Assessment - 05/03/15 1409    Subjective States notes B LE muscle soreness on day following PT treatments.  States "sitting here, no pain but I'm sore in back and hips".            TODAY'S TREATMENT TherEx - NuStep lvl 4, 5' Seated LAQ 4# 15x each Seated March with contra UE 10x each, 4# on ankles Seated Trunk isometrics 3x5" FW, BW, and B Rotation with moderate resistance and no c/o pain Seated Fitter Leg Press 2 Blue 2x12 each Seated B Hip ABD Green TB 10x (still with small ROM but better than last time with Blue TB) Seated Unilateral Hip ADD Green TB 10x each Standing CKC Hip  ABD in and out of Trendelenburg with SPC assist 15x each 8" Step Toe-Tapping with SPC A 10x each Standing Hip ABD Yellow TB at ankles, counter assist 12x each           PT Short Term Goals - 04/07/15 1504    PT SHORT TERM GOAL #1   Title pt independent with initial  HEP by 04/05/15   Status Achieved           PT Long Term Goals - 04/07/15 1504    PT LONG TERM GOAL #1   Title pt reports decreased LBP to 2/10 or less on average by 05/05/15   Status On-going   PT LONG TERM GOAL #2   Title pt displays B Hip MMT 4/5 or better grossly by 05/05/15   Status On-going   PT LONG TERM GOAL #3   Title pt able to stand and ambulate up to 60 minutes without limitation by LBP or LE weakness by 05/05/15   Status On-going   PT LONG TERM GOAL #4   Title pt able to perform transfers (sit->stand and car transfers) with minimal to no need for UE use by 05/05/15   Status On-going               Plan - 05/03/15 1445    Clinical Impression Statement No pain at rest today.  She has been noting some R shoulder pain so we did not perform any exercises with UE today (rowing). She performed better with Fitter today (VC to increase  speed of pushing and L was nearly as good as R with this).  Otherwise, still with greater weakness and less control with L LE vs R.  No c/o LBP with today's treatment.   PT Next Visit Plan Lumbopelvic stability training as tolerated; Hip stretngthening as able   Consulted and Agree with Plan of Care Patient        Problem List Patient Active Problem List   Diagnosis Date Noted  . Postconcussive syndrome 04/01/2015  . Neck pain on left side 04/01/2015  . Brain aneurysm 01/16/2015  . Hyperlipidemia, mild 10/08/2014  . Osteopenia 10/08/2014  . Abdominal pain 08/25/2014  . Peripheral edema 08/25/2014  . Dehydration 08/25/2014  . History of DVT (deep vein thrombosis) 08/14/2014  . Anemia   . GERD (gastroesophageal reflux disease)   . Bilateral dry eyes   . History of chicken pox   . Adiposity 07/28/2013  . Esophagitis 09/13/2010  . Shortness of breath 06/16/2010  . Chest pain, unspecified 06/16/2010  . Osteoarthrosis involving more than one site but not generalized 05/30/2010  . Aortic valve disorder 12/01/2008  . CAROTID STENOSIS 12/01/2008  . HIP PAIN 12/01/2008  . HAMMER TOE 11/27/2008  . Palpitations 11/27/2008  . H/O cataract extraction 03/15/2007  . Benign essential HTN 04/17/2006  . Buzzing in ear 04/12/2006  . History of artificial joint 03/17/2005  . Leg pain 10/17/2004  . Arthritis, degenerative 05/21/2002  . Chronic venous insufficiency 05/21/2002  . Bone/cartilage disorder 04/26/2001  . 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  [place], [place], [address] Phone: [phone]   Fax:  [phone]  Name: Mikayla [name] MRN: [id number] Date of Birth: Jun 11, 1940

## 2015-05-05 ENCOUNTER — Ambulatory Visit: Payer: Medicare Other | Admitting: Physical Therapy

## 2015-05-05 DIAGNOSIS — R262 Difficulty in walking, not elsewhere classified: Secondary | ICD-10-CM

## 2015-05-05 DIAGNOSIS — M545 Low back pain, unspecified: Secondary | ICD-10-CM

## 2015-05-05 DIAGNOSIS — R293 Abnormal posture: Secondary | ICD-10-CM

## 2015-05-05 DIAGNOSIS — R269 Unspecified abnormalities of gait and mobility: Secondary | ICD-10-CM

## 2015-05-05 DIAGNOSIS — M6289 Other specified disorders of muscle: Secondary | ICD-10-CM | POA: Diagnosis not present

## 2015-05-05 DIAGNOSIS — R29898 Other symptoms and signs involving the musculoskeletal system: Secondary | ICD-10-CM

## 2015-05-05 NOTE — Therapy (Signed)
[facility] [address]  [place], [place], [address] Phone: [phone]   Fax:  [phone]  Physical Therapy Treatment  Patient Details  Name: Mikayla [name] MRN: [id number] Date of Birth: [birth year] Referring Provider: Consuella Lose, MD  Encounter Date: 05/05/2015      PT End of Session - 05/05/15 1402    Visit Number 7   Number of Visits 12   PT Start Time 1401   PT Stop Time 1443   PT Time Calculation (min) 42 min      Past Medical History  Diagnosis Date  . Acute venous embolism and thrombosis of unspecified deep vessels of lower extremity     after broken foot screw left in place, left leg  . Aortic stenosis     echo 11/11: EF 60%, mild LVH, mod/severe AS with mean 32 mmHg, peak 55 mmHg, mild AI, mild MR, (previous echo 2010 with mean gradient 29 mmHg)  . Carotid stenosis     dopplers 11/11: 0-39% bilat  . Palpitations     PACs and atrial tachy  . Other congenital anomaly of toes   . Pain in joint, pelvic region and thigh   . Atrial tachycardia ([place])   . Dyslipidemia   . Anemia   . GERD (gastroesophageal reflux disease)   . Bilateral dry eyes   . Hyperlipidemia   . History of chicken pox   . H/O measles   . Abdominal pain 08/25/2014  . Peripheral edema 08/25/2014  . Dehydration 08/25/2014  . Hyperlipidemia, mild 10/08/2014  . Osteopenia 10/08/2014  . Brain aneurysm 01/16/2015    Past Surgical History  Procedure Laterality Date  . Tonsillectomy    . Radical hysterectomy    . Foot surgery    . Left hip replacement    . Hyroglossal duct cyst resection    . Left and right heart catheterization with coronary angiogram N/A 03/21/2013    Procedure: LEFT AND RIGHT HEART CATHETERIZATION WITH CORONARY ANGIOGRAM;  Surgeon: Minus Breeding, MD;  Location: [facility] CATH LAB;  Service: Cardiovascular;  Laterality: N/A;  . Cholecystectomy      There were no vitals filed for this visit.  Visit Diagnosis:   Midline low back pain without sciatica  Weakness of both hips  Difficulty walking  Abnormal posture  Abnormality of gait      Subjective Assessment - 05/05/15 1404    Subjective States feels as though is improving with regard to LBP and level of function.   Currently in Pain? Yes   Pain Score 5    Pain Location Back   Pain Orientation Lower       TODAY'S TREATMENT TherEx - NuStep lvl 4, 5' Standing CKC Hip ABD in and out of Trendelenburg with SPC assist 15x each Seated Fitter Leg Press 2 Blue 15x each, 1 Black + 1 Blue 10x Seated LAQ 4# 15x each (Good B ROM) Seated Trunk isometrics 4x5" FW, BW, and B Rotation with moderate resistance and no c/o pain 8" Step Toe-Tapping with SPC A 10x each Bridge 8x (1st time she has been able to lift hips off table!) Hooklying Diagonal UE/Contra LE flex/ext 5x each (difficult with L LE but was able to perform full ROM on 2 of the reps, other 3 required slight A by PT) Hooklying B Hip ABD Green TB 15x (good ROM today) Hooklying Unilateral Hip ADD Green TB 12x each (good ROM here as well)  PT Short Term Goals - 04/07/15 1504    PT SHORT TERM GOAL #1   Title pt independent with initial HEP by 04/05/15   Status Achieved           PT Long Term Goals - 04/07/15 1504    PT LONG TERM GOAL #1   Title pt reports decreased LBP to 2/10 or less on average by 05/05/15   Status On-going   PT LONG TERM GOAL #2   Title pt displays B Hip MMT 4/5 or better grossly by 05/05/15   Status On-going   PT LONG TERM GOAL #3   Title pt able to stand and ambulate up to 60 minutes without limitation by LBP or LE weakness by 05/05/15   Status On-going   PT LONG TERM GOAL #4   Title pt able to perform transfers (sit->stand and car transfers) with minimal to no need for UE use by 05/05/15   Status On-going               Plan - 05/05/15 1444    Clinical Impression Statement best performance to date on several exercises today  demonstrating improving hip strength.  She is still quite weak but improvement is noted.  She reports feeling better with regard to pain and function as well.  Will continue with current POC.   PT Next Visit Plan Lumbopelvic stability training as tolerated; Hip stretngthening as able   Consulted and Agree with Plan of Care Patient        Problem List Patient Active Problem List   Diagnosis Date Noted  . Postconcussive syndrome 04/01/2015  . Neck pain on left side 04/01/2015  . Brain aneurysm 01/16/2015  . Hyperlipidemia, mild 10/08/2014  . Osteopenia 10/08/2014  . Abdominal pain 08/25/2014  . Peripheral edema 08/25/2014  . Dehydration 08/25/2014  . History of DVT (deep vein thrombosis) 08/14/2014  . Anemia   . GERD (gastroesophageal reflux disease)   . Bilateral dry eyes   . History of chicken pox   . Adiposity 07/28/2013  . Esophagitis 09/13/2010  . Shortness of breath 06/16/2010  . Chest pain, unspecified 06/16/2010  . Osteoarthrosis involving more than one site but not generalized 05/30/2010  . Aortic valve disorder 12/01/2008  . CAROTID STENOSIS 12/01/2008  . HIP PAIN 12/01/2008  . HAMMER TOE 11/27/2008  . Palpitations 11/27/2008  . H/O cataract extraction 03/15/2007  . Benign essential HTN 04/17/2006  . Buzzing in ear 04/12/2006  . History of artificial joint 03/17/2005  . Leg pain 10/17/2004  . Arthritis, degenerative 05/21/2002  . Chronic venous insufficiency 05/21/2002  . Bone/cartilage disorder 04/26/2001  . 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

## 2015-05-10 ENCOUNTER — Ambulatory Visit: Payer: Medicare Other | Attending: Neurosurgery | Admitting: Physical Therapy

## 2015-05-10 DIAGNOSIS — M545 Low back pain, unspecified: Secondary | ICD-10-CM

## 2015-05-10 DIAGNOSIS — R2689 Other abnormalities of gait and mobility: Secondary | ICD-10-CM | POA: Diagnosis not present

## 2015-05-10 DIAGNOSIS — R269 Unspecified abnormalities of gait and mobility: Secondary | ICD-10-CM | POA: Diagnosis not present

## 2015-05-10 DIAGNOSIS — R293 Abnormal posture: Secondary | ICD-10-CM | POA: Diagnosis not present

## 2015-05-10 DIAGNOSIS — M6289 Other specified disorders of muscle: Secondary | ICD-10-CM | POA: Diagnosis not present

## 2015-05-10 DIAGNOSIS — R262 Difficulty in walking, not elsewhere classified: Secondary | ICD-10-CM | POA: Insufficient documentation

## 2015-05-10 NOTE — Therapy (Signed)
[facility] [address]  [place], [place], [address] Phone: [phone]   Fax:  [phone]  Physical Therapy Treatment  Patient Details  Name: Mikayla [name] MRN: [id number] Date of Birth: [birth year] Referring Provider: Consuella Lose, MD  Encounter Date: 05/10/2015      PT End of Session - 05/10/15 0852    Visit Number 8   Number of Visits 12   PT Start Time 0850   PT Stop Time 0925   PT Time Calculation (min) 35 min      Past Medical History  Diagnosis Date  . Acute venous embolism and thrombosis of unspecified deep vessels of lower extremity     after broken foot screw left in place, left leg  . Aortic stenosis     echo 11/11: EF 60%, mild LVH, mod/severe AS with mean 32 mmHg, peak 55 mmHg, mild AI, mild MR, (previous echo 2010 with mean gradient 29 mmHg)  . Carotid stenosis     dopplers 11/11: 0-39% bilat  . Palpitations     PACs and atrial tachy  . Other congenital anomaly of toes   . Pain in joint, pelvic region and thigh   . Atrial tachycardia ([place])   . Dyslipidemia   . Anemia   . GERD (gastroesophageal reflux disease)   . Bilateral dry eyes   . Hyperlipidemia   . History of chicken pox   . H/O measles   . Abdominal pain 08/25/2014  . Peripheral edema 08/25/2014  . Dehydration 08/25/2014  . Hyperlipidemia, mild 10/08/2014  . Osteopenia 10/08/2014  . Brain aneurysm 01/16/2015    Past Surgical History  Procedure Laterality Date  . Tonsillectomy    . Radical hysterectomy    . Foot surgery    . Left hip replacement    . Hyroglossal duct cyst resection    . Left and right heart catheterization with coronary angiogram N/A 03/21/2013    Procedure: LEFT AND RIGHT HEART CATHETERIZATION WITH CORONARY ANGIOGRAM;  Surgeon: Minus Breeding, MD;  Location: [facility] CATH LAB;  Service: Cardiovascular;  Laterality: N/A;  . Cholecystectomy      There were no vitals filed for this visit.  Visit Diagnosis:  Midline  low back pain without sciatica  Weakness of both hips  Difficulty walking  Abnormal posture  Abnormality of gait      Subjective Assessment - 05/10/15 0852    Subjective States back feels fine so far today and states her LBP seems to be improving; however, she states she hasn't done much walking lately which is what typically causes increased LBP.  She states she was sore for days following last treatment "all over" in B LE.   Currently in Pain? No/denies            TODAY'S TREATMENT TherEx - NuStep lvl 5, 6' Hooklying B Hip ABD Green TB 12x Stretch B HS, Piri, SKTC Bridge 10x (able to perform with small lifts) Hooklying Unilateral Hip ADD Green TB 10x each Hooklying LE March 10x each Diagonal partial curl-up reaching towards opposite knee 8x each (small ROM, not lifting head only scapula) Seated LAQ 4# 12x each (Good B ROM) Seated Fitter Leg Press 2 Blue 15x each Seated Trunk isometrics 5x5" FW, BW, and B Rotation with moderate resistance and no c/o pain           PT Short Term Goals - 04/07/15 1504    PT SHORT TERM GOAL #1   Title pt  independent with initial HEP by 04/05/15   Status Achieved           PT Long Term Goals - 04/07/15 1504    PT LONG TERM GOAL #1   Title pt reports decreased LBP to 2/10 or less on average by 05/05/15   Status On-going   PT LONG TERM GOAL #2   Title pt displays B Hip MMT 4/5 or better grossly by 05/05/15   Status On-going   PT LONG TERM GOAL #3   Title pt able to stand and ambulate up to 60 minutes without limitation by LBP or LE weakness by 05/05/15   Status On-going   PT LONG TERM GOAL #4   Title pt able to perform transfers (sit->stand and car transfers) with minimal to no need for UE use by 05/05/15   Status On-going               Plan - 05/10/15 0854    Clinical Impression Statement Pt was very sore after last treatment due to intensity of session causing muscle soreness.  Backed off some with intensity today  to hopefully prevent this amount of soreness.  She states her lower back seems to be getting much better lately; however, snhe states she hasn't been up walking a great deal lately which is her typical source of pain.   PT Next Visit Plan Lumbopelvic stability training as tolerated; Hip stretngthening as able   Consulted and Agree with Plan of Care Patient        Problem List Patient Active Problem List   Diagnosis Date Noted  . Postconcussive syndrome 04/01/2015  . Neck pain on left side 04/01/2015  . Brain aneurysm 01/16/2015  . Hyperlipidemia, mild 10/08/2014  . Osteopenia 10/08/2014  . Abdominal pain 08/25/2014  . Peripheral edema 08/25/2014  . Dehydration 08/25/2014  . History of DVT (deep vein thrombosis) 08/14/2014  . Anemia   . GERD (gastroesophageal reflux disease)   . Bilateral dry eyes   . History of chicken pox   . Adiposity 07/28/2013  . Esophagitis 09/13/2010  . Shortness of breath 06/16/2010  . Chest pain, unspecified 06/16/2010  . Osteoarthrosis involving more than one site but not generalized 05/30/2010  . Aortic valve disorder 12/01/2008  . CAROTID STENOSIS 12/01/2008  . HIP PAIN 12/01/2008  . HAMMER TOE 11/27/2008  . Palpitations 11/27/2008  . H/O cataract extraction 03/15/2007  . Benign essential HTN 04/17/2006  . Buzzing in ear 04/12/2006  . History of artificial joint 03/17/2005  . Leg pain 10/17/2004  . Arthritis, degenerative 05/21/2002  . Chronic venous insufficiency 05/21/2002  . Bone/cartilage disorder 04/26/2001  . 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  [place], [place], [address] Phone: [phone]   Fax:  [phone]  Name: Mikayla [name] MRN: [id number] Date of Birth: 07-Jun-1940

## 2015-05-12 ENCOUNTER — Ambulatory Visit (INDEPENDENT_AMBULATORY_CARE_PROVIDER_SITE_OTHER): Payer: Medicare Other | Admitting: Behavioral Health

## 2015-05-12 ENCOUNTER — Ambulatory Visit: Payer: Medicare Other | Admitting: Physical Therapy

## 2015-05-12 DIAGNOSIS — Z86718 Personal history of other venous thrombosis and embolism: Secondary | ICD-10-CM | POA: Diagnosis not present

## 2015-05-12 DIAGNOSIS — M545 Low back pain, unspecified: Secondary | ICD-10-CM

## 2015-05-12 DIAGNOSIS — R2689 Other abnormalities of gait and mobility: Secondary | ICD-10-CM | POA: Diagnosis not present

## 2015-05-12 DIAGNOSIS — R262 Difficulty in walking, not elsewhere classified: Secondary | ICD-10-CM

## 2015-05-12 DIAGNOSIS — R269 Unspecified abnormalities of gait and mobility: Secondary | ICD-10-CM | POA: Diagnosis not present

## 2015-05-12 DIAGNOSIS — M6289 Other specified disorders of muscle: Secondary | ICD-10-CM | POA: Diagnosis not present

## 2015-05-12 DIAGNOSIS — R293 Abnormal posture: Secondary | ICD-10-CM | POA: Diagnosis not present

## 2015-05-12 LAB — POCT INR: INR: 1.6

## 2015-05-12 NOTE — Progress Notes (Signed)
Pre visit review using our clinic review tool, if applicable. No additional management support is needed unless otherwise documented below in the visit note.  Patient in office for INR check. Today's reading was 1.6. Per the patient, as of this past Sunday, she has started cutting out sweets and breads in her diet; no other changes were reported. Also, patient voiced that she was under the impression that she could not eat foods with vitamin K. Educated the patient that she can consume foods with vitamin K, but be consistent with the number of servings each week because some foods are higher in vitamin K than others, which can counteract with the Coumadin. Informed patient that having more greens than usual can potentially decrease INR and the blood would be thicker, therefore increasing the risk for blood clots.   Per Dr. Lorelei Pont: INR is a little low today at 1.6.  Current weekly dose is 32.5 mg.  Will increase by 5mg  total this week- take 5 mg every day except take 7.5 mg once weekly. Recheck in 1 week. Informed patient of the provider's instructions. She verbalized understanding of the education provided, as well as the provider's recommendations. Patient did not have any further concerns prior to leaving the visit.

## 2015-05-12 NOTE — Patient Instructions (Signed)
Per Dr. Lorelei Pont: INR is a little low today at 1.6.  Current weekly dose is 32.5 mg.  Will increase by 5mg  total this week- take 5 mg every day except take 7.5 mg once weekly. Recheck in 1 week.

## 2015-05-12 NOTE — Therapy (Signed)
[facility] [address]  [place], [place], [address] Phone: [phone]   Fax:  [phone]  Physical Therapy Treatment  Patient Details  Name: Mikayla [name] MRN: [id number] Date of Birth: 09-Jan-1941 Referring Provider: Consuella Lose, MD  Encounter Date: 05/12/2015      PT End of Session - 05/12/15 1458    Visit Number 9   Number of Visits 12   PT Start Time 1410   PT Stop Time [id number]   PT Time Calculation (min) 35 min      Past Medical History  Diagnosis Date  . Acute venous embolism and thrombosis of unspecified deep vessels of lower extremity     after broken foot screw left in place, left leg  . Aortic stenosis     echo 11/11: EF 60%, mild LVH, mod/severe AS with mean 32 mmHg, peak 55 mmHg, mild AI, mild MR, (previous echo 2010 with mean gradient 29 mmHg)  . Carotid stenosis     dopplers 11/11: 0-39% bilat  . Palpitations     PACs and atrial tachy  . Other congenital anomaly of toes   . Pain in joint, pelvic region and thigh   . Atrial tachycardia ([place])   . Dyslipidemia   . Anemia   . GERD (gastroesophageal reflux disease)   . Bilateral dry eyes   . Hyperlipidemia   . History of chicken pox   . H/O measles   . Abdominal pain 08/25/2014  . Peripheral edema 08/25/2014  . Dehydration 08/25/2014  . Hyperlipidemia, mild 10/08/2014  . Osteopenia 10/08/2014  . Brain aneurysm 01/16/2015    Past Surgical History  Procedure Laterality Date  . Tonsillectomy    . Radical hysterectomy    . Foot surgery    . Left hip replacement    . Hyroglossal duct cyst resection    . Left and right heart catheterization with coronary angiogram N/A 03/21/2013    Procedure: LEFT AND RIGHT HEART CATHETERIZATION WITH CORONARY ANGIOGRAM;  Surgeon: Minus Breeding, MD;  Location: [facility] CATH LAB;  Service: Cardiovascular;  Laterality: N/A;  . Cholecystectomy      There were no vitals filed for this visit.  Visit Diagnosis:  Midline  low back pain without sciatica  Weakness of both hips  Difficulty walking  Abnormal posture  Abnormality of gait      Subjective Assessment - 05/12/15 1408    Subjective States did not get as sore following last treatment. Regarding LBP states "maybe a 4/10, just sore" States R hip seems more sore today and she's concerned she may need R Hip replacement.   Currently in Pain? Yes   Pain Score 4    Pain Location Back   Pain Orientation Lower           TODAY'S TREATMENT TherEx - NuStep lvl 4, 5'  Manual - R hip long axis distraction grade 3, L hip hiking contract/relax to see if can relax into more neutral pelvis looking to see if pt with true LLD  TherEx - B FOB (55cm) Tuck AROM 15x Pelvic tilt 10x5" Hooklying B Hip ABD Green TB 15x Hooklying LE March 10x each Stretch B HS, Piri, SKTC               PT Short Term Goals - 04/07/15 1504    PT SHORT TERM GOAL #1   Title pt independent with initial HEP by 04/05/15   Status Achieved  PT Long Term Goals - 04/07/15 1504    PT LONG TERM GOAL #1   Title pt reports decreased LBP to 2/10 or less on average by 05/05/15   Status On-going   PT LONG TERM GOAL #2   Title pt displays B Hip MMT 4/5 or better grossly by 05/05/15   Status On-going   PT LONG TERM GOAL #3   Title pt able to stand and ambulate up to 60 minutes without limitation by LBP or LE weakness by 05/05/15   Status On-going   PT LONG TERM GOAL #4   Title pt able to perform transfers (sit->stand and car transfers) with minimal to no need for UE use by 05/05/15   Status On-going               Plan - 05/12/15 1536    Clinical Impression Statement spent some time looking at pt's concern for L LE short vs R.  There is certainly a difference in supine and standing but there is also a mild pelvic torsion present but this doesn't seem severe enough to account for difference noted in LE's.  She is noting some R hip pain increase today but  her back is doindg pretty good. She is concerned she may need THA in R (she had THA in L in 2007).  I advised her she could ask her PCP to refer for hip x-ray.  Exercises today with some decreased intensity due to R hip pain but tolerated wel.   PT Next Visit Plan Lumbopelvic stability training as tolerated; Hip stretngthening as able   Consulted and Agree with Plan of Care Patient        Problem List Patient Active Problem List   Diagnosis Date Noted  . Postconcussive syndrome 04/01/2015  . Neck pain on left side 04/01/2015  . Brain aneurysm 01/16/2015  . Hyperlipidemia, mild 10/08/2014  . Osteopenia 10/08/2014  . Abdominal pain 08/25/2014  . Peripheral edema 08/25/2014  . Dehydration 08/25/2014  . History of DVT (deep vein thrombosis) 08/14/2014  . Anemia   . GERD (gastroesophageal reflux disease)   . Bilateral dry eyes   . History of chicken pox   . Adiposity 07/28/2013  . Esophagitis 09/13/2010  . Shortness of breath 06/16/2010  . Chest pain, unspecified 06/16/2010  . Osteoarthrosis involving more than one site but not generalized 05/30/2010  . Aortic valve disorder 12/01/2008  . CAROTID STENOSIS 12/01/2008  . HIP PAIN 12/01/2008  . HAMMER TOE 11/27/2008  . Palpitations 11/27/2008  . H/O cataract extraction 03/15/2007  . Benign essential HTN 04/17/2006  . Buzzing in ear 04/12/2006  . History of artificial joint 03/17/2005  . Leg pain 10/17/2004  . Arthritis, degenerative 05/21/2002  . Chronic venous insufficiency 05/21/2002  . Bone/cartilage disorder 04/26/2001  . 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

## 2015-05-17 ENCOUNTER — Ambulatory Visit: Payer: Medicare Other | Admitting: Physical Therapy

## 2015-05-19 ENCOUNTER — Ambulatory Visit (INDEPENDENT_AMBULATORY_CARE_PROVIDER_SITE_OTHER): Payer: Medicare Other | Admitting: Family Medicine

## 2015-05-19 ENCOUNTER — Ambulatory Visit: Payer: Medicare Other | Admitting: Physical Therapy

## 2015-05-19 DIAGNOSIS — M545 Low back pain, unspecified: Secondary | ICD-10-CM

## 2015-05-19 DIAGNOSIS — Z86718 Personal history of other venous thrombosis and embolism: Secondary | ICD-10-CM

## 2015-05-19 DIAGNOSIS — R262 Difficulty in walking, not elsewhere classified: Secondary | ICD-10-CM | POA: Diagnosis not present

## 2015-05-19 DIAGNOSIS — R2689 Other abnormalities of gait and mobility: Secondary | ICD-10-CM | POA: Diagnosis not present

## 2015-05-19 DIAGNOSIS — M6289 Other specified disorders of muscle: Secondary | ICD-10-CM | POA: Diagnosis not present

## 2015-05-19 DIAGNOSIS — R293 Abnormal posture: Secondary | ICD-10-CM | POA: Diagnosis not present

## 2015-05-19 DIAGNOSIS — R269 Unspecified abnormalities of gait and mobility: Secondary | ICD-10-CM | POA: Diagnosis not present

## 2015-05-19 LAB — POCT INR: INR: 2.5

## 2015-05-19 NOTE — Therapy (Signed)
[facility] [address]  [place], [place], [address] Phone: [phone]   Fax:  [phone]  Physical Therapy Treatment  Patient Details  Name: Mikayla [name] MRN: [id number] Date of Birth: [birth year] Referring Provider: Consuella Lose, MD  Encounter Date: 05/19/2015      PT End of Session - 05/19/15 1407    Visit Number 10   Number of Visits 12   PT Start Time [id number]   PT Stop Time 1448   PT Time Calculation (min) 43 min      Past Medical History  Diagnosis Date  . Acute venous embolism and thrombosis of unspecified deep vessels of lower extremity     after broken foot screw left in place, left leg  . Aortic stenosis     echo 11/11: EF 60%, mild LVH, mod/severe AS with mean 32 mmHg, peak 55 mmHg, mild AI, mild MR, (previous echo 2010 with mean gradient 29 mmHg)  . Carotid stenosis     dopplers 11/11: 0-39% bilat  . Palpitations     PACs and atrial tachy  . Other congenital anomaly of toes   . Pain in joint, pelvic region and thigh   . Atrial tachycardia ([place])   . Dyslipidemia   . Anemia   . GERD (gastroesophageal reflux disease)   . Bilateral dry eyes   . Hyperlipidemia   . History of chicken pox   . H/O measles   . Abdominal pain 08/25/2014  . Peripheral edema 08/25/2014  . Dehydration 08/25/2014  . Hyperlipidemia, mild 10/08/2014  . Osteopenia 10/08/2014  . Brain aneurysm 01/16/2015    Past Surgical History  Procedure Laterality Date  . Tonsillectomy    . Radical hysterectomy    . Foot surgery    . Left hip replacement    . Hyroglossal duct cyst resection    . Left and right heart catheterization with coronary angiogram N/A 03/21/2013    Procedure: LEFT AND RIGHT HEART CATHETERIZATION WITH CORONARY ANGIOGRAM;  Surgeon: Minus Breeding, MD;  Location: [facility] CATH LAB;  Service: Cardiovascular;  Laterality: N/A;  . Cholecystectomy      There were no vitals filed for this visit.      Subjective  Assessment - 05/19/15 1407    Subjective States was sore for couple days after last treatment but then felt good over the weekend so performed some housework and pain returned.  States pain 6-7/10 following this but down to 4/10 today.  States prior to this she was really pleased with progress.   Currently in Pain? Yes   Pain Score 4    Pain Location Back   Pain Orientation Lower          TODAY'S TREATMENT TherEx - Hooklying Unilateral Hip ABD Green TB 2x5 each Pelvic Tilt 10x5" Stretch B HS, Piri, SKTC, B LE nerve glides Hooklying LE March 1# at ankles 10x each Hip ADD Green TB 12x each Bridge 8x (some mild abdominal area pain today so fewer reps) Seated Fitter Leg Press 2 Blue 10x each, 1 Black + 1 Blue 10x each Standing Hip Flexion Red TB 8x each with Single Pole A Standing Hip ABD Red TB 8x each with B HHA on Single Pole (difficult with B LE)          PT Short Term Goals - 04/07/15 1504    PT SHORT TERM GOAL #1   Title pt independent with initial HEP by 04/05/15   Status Achieved  PT Long Term Goals - 04/07/15 1504    PT LONG TERM GOAL #1   Title pt reports decreased LBP to 2/10 or less on average by 05/05/15   Status On-going   PT LONG TERM GOAL #2   Title pt displays B Hip MMT 4/5 or better grossly by 05/05/15   Status On-going   PT LONG TERM GOAL #3   Title pt able to stand and ambulate up to 60 minutes without limitation by LBP or LE weakness by 05/05/15   Status On-going   PT LONG TERM GOAL #4   Title pt able to perform transfers (sit->stand and car transfers) with minimal to no need for UE use by 05/05/15   Status On-going               Plan - 2015-05-20 1518    Clinical Impression Statement pt's foto score slightly down from initial score; however she states she is improving with PT to date.  Issue seems, in the past few days she has noted increased pain and decreased function due to this. She has certainly improved in LE strength as noted  with increased reps and/or resistance with exercises in clinic.  We will continue to progress as able.   PT Next Visit Plan Lumbopelvic stability training as tolerated; Hip strengthening as able      Patient will benefit from skilled therapeutic intervention in order to improve the following deficits and impairments:  Pain, Decreased strength, Decreased mobility, Decreased balance, Decreased activity tolerance, Abnormal gait, Difficulty walking  Visit Diagnosis: Midline low back pain without sciatica       G-Codes - 05-20-15 1449    Functional Assessment Tool Used FOTO 69% limitation   Functional Limitation Mobility: Walking and moving around   Mobility: Walking and Moving Around Current Status [phone]) At least 60 percent but less than 80 percent impaired, limited or restricted   Mobility: Walking and Moving Around Goal Status [phone]) At least 40 percent but less than 60 percent impaired, limited or restricted      Problem List Patient Active Problem List   Diagnosis Date Noted  . Postconcussive syndrome 04/01/2015  . Neck pain on left side 04/01/2015  . Brain aneurysm 01/16/2015  . Hyperlipidemia, mild 10/08/2014  . Osteopenia 10/08/2014  . Abdominal pain 08/25/2014  . Peripheral edema 08/25/2014  . Dehydration 08/25/2014  . History of DVT (deep vein thrombosis) 08/14/2014  . Anemia   . GERD (gastroesophageal reflux disease)   . Bilateral dry eyes   . History of chicken pox   . Adiposity 07/28/2013  . Esophagitis 09/13/2010  . Shortness of breath 06/16/2010  . Chest pain, unspecified 06/16/2010  . Osteoarthrosis involving more than one site but not generalized 05/30/2010  . Aortic valve disorder 12/01/2008  . CAROTID STENOSIS 12/01/2008  . HIP PAIN 12/01/2008  . HAMMER TOE 11/27/2008  . Palpitations 11/27/2008  . H/O cataract extraction 03/15/2007  . Benign essential HTN 04/17/2006  . Buzzing in ear 04/12/2006  . History of artificial joint 03/17/2005  . Leg pain  10/17/2004  . Arthritis, degenerative 05/21/2002  . Chronic venous insufficiency 05/21/2002  . Bone/cartilage disorder 04/26/2001  . 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

## 2015-05-19 NOTE — Progress Notes (Signed)
Pre visit review using our clinic tool,if applicable. No additional management support is needed unless otherwise documented below in the visit note.   Patient in for INR check per order from Dr. Charlett Blake.. INR = 2.5 Goal is 2.0-3.0. Patient within goal today. Per Dr. Lorelei Pont covering for Dr. Charlett Blake, patient to continue 5 mg Coumadin daily and return in 3 weeks. Appointment scheduled for patient.

## 2015-05-26 ENCOUNTER — Ambulatory Visit: Payer: Medicare Other | Admitting: Physical Therapy

## 2015-05-26 DIAGNOSIS — G44309 Post-traumatic headache, unspecified, not intractable: Secondary | ICD-10-CM | POA: Diagnosis not present

## 2015-05-26 DIAGNOSIS — M542 Cervicalgia: Secondary | ICD-10-CM | POA: Diagnosis not present

## 2015-05-26 DIAGNOSIS — F0781 Postconcussional syndrome: Secondary | ICD-10-CM | POA: Diagnosis not present

## 2015-05-31 ENCOUNTER — Ambulatory Visit: Payer: Medicare Other | Admitting: Physical Therapy

## 2015-05-31 DIAGNOSIS — R269 Unspecified abnormalities of gait and mobility: Secondary | ICD-10-CM | POA: Diagnosis not present

## 2015-05-31 DIAGNOSIS — R2689 Other abnormalities of gait and mobility: Secondary | ICD-10-CM | POA: Diagnosis not present

## 2015-05-31 DIAGNOSIS — R262 Difficulty in walking, not elsewhere classified: Secondary | ICD-10-CM | POA: Diagnosis not present

## 2015-05-31 DIAGNOSIS — M545 Low back pain, unspecified: Secondary | ICD-10-CM

## 2015-05-31 DIAGNOSIS — M6289 Other specified disorders of muscle: Secondary | ICD-10-CM | POA: Diagnosis not present

## 2015-05-31 DIAGNOSIS — R293 Abnormal posture: Secondary | ICD-10-CM | POA: Diagnosis not present

## 2015-05-31 NOTE — Therapy (Signed)
[facility] [address]  [place], [place], [address] Phone: [phone]   Fax:  [phone]  Physical Therapy Treatment  Patient Details  Name: Mikayla [name] MRN: [id number] Date of Birth: 14-Jul-1940 Referring Provider: Consuella Lose, MD  Encounter Date: 05/31/2015      PT End of Session - 05/31/15 1406    Visit Number 11   Number of Visits 12   PT Start Time [id number]   PT Stop Time 1450   PT Time Calculation (min) 45 min      Past Medical History  Diagnosis Date  . Acute venous embolism and thrombosis of unspecified deep vessels of lower extremity     after broken foot screw left in place, left leg  . Aortic stenosis     echo 11/11: EF 60%, mild LVH, mod/severe AS with mean 32 mmHg, peak 55 mmHg, mild AI, mild MR, (previous echo 2010 with mean gradient 29 mmHg)  . Carotid stenosis     dopplers 11/11: 0-39% bilat  . Palpitations     PACs and atrial tachy  . Other congenital anomaly of toes   . Pain in joint, pelvic region and thigh   . Atrial tachycardia ([place])   . Dyslipidemia   . Anemia   . GERD (gastroesophageal reflux disease)   . Bilateral dry eyes   . Hyperlipidemia   . History of chicken pox   . H/O measles   . Abdominal pain 08/25/2014  . Peripheral edema 08/25/2014  . Dehydration 08/25/2014  . Hyperlipidemia, mild 10/08/2014  . Osteopenia 10/08/2014  . Brain aneurysm 01/16/2015    Past Surgical History  Procedure Laterality Date  . Tonsillectomy    . Radical hysterectomy    . Foot surgery    . Left hip replacement    . Hyroglossal duct cyst resection    . Left and right heart catheterization with coronary angiogram N/A 03/21/2013    Procedure: LEFT AND RIGHT HEART CATHETERIZATION WITH CORONARY ANGIOGRAM;  Surgeon: Minus Breeding, MD;  Location: [facility] CATH LAB;  Service: Cardiovascular;  Laterality: N/A;  . Cholecystectomy      There were no vitals filed for this visit.      Subjective  Assessment - 05/31/15 1406    Subjective States back has been pretty good lately.  "with walking and standing, its good" but states still notes pain with forward bending activities.  States is unable to reach her feet and she has been using sock aide since she is unable to reach her feet without pain.   Currently in Pain? Yes   Pain Score --  "just a little bit or soreness"   Pain Location Back        TODAY'S TREATMENT TherEx - Bridge 12x Hooklying LE March 10x each Stretch B HS, Piri, SKTC, Supine Mod Thomas hip flexor stretch (B tight, worse on L) Pelvic Tilt 10x5" Diagonal curl-up UE reaching to outside of contra LE 10x each Hooklying Unilateral Hip ABD Green TB 8x each Hip ADD Green TB 10x each Single FOB (65cm) roll-up with UE diagonal reach 10x each Seated Trunk isometrics: Flex, Ext, B Rotation 4x5" each with moderate to heavy resistance Standing Hip ABD Red TB 10x each with B HHA on back of chair (difficult with B LE)            PT Short Term Goals - 04/07/15 1504    PT SHORT TERM GOAL #1   Title pt independent with  initial HEP by 04/05/15   Status Achieved           PT Long Term Goals - 04/07/15 1504    PT LONG TERM GOAL #1   Title pt reports decreased LBP to 2/10 or less on average by 05/05/15   Status On-going   PT LONG TERM GOAL #2   Title pt displays B Hip MMT 4/5 or better grossly by 05/05/15   Status On-going   PT LONG TERM GOAL #3   Title pt able to stand and ambulate up to 60 minutes without limitation by LBP or LE weakness by 05/05/15   Status On-going   PT LONG TERM GOAL #4   Title pt able to perform transfers (sit->stand and car transfers) with minimal to no need for UE use by 05/05/15   Status On-going               Plan - 05/31/15 1411    Clinical Impression Statement 11th visit today; pt missed last week due to illness.  She return in 2 days for re-assessment for renew vs d/c.  She is making good progress and states her back pain is  much better with standing and walking activities.  States still pain/difficulty with forward bending (unable to don socks without sock aide).  There may be OA in R hip which would contribute to this pain.  We have ordered heel lifts to see if this will help with her pain but they haven't arrived yet (ordered nearly 2 weeks ago).  She is still quite weak in her hips but has made some progress in this.  We will re-assess MMT next treatment.   PT Next Visit Plan Lumbopelvic stability training as tolerated; Hip strengthening as able   Consulted and Agree with Plan of Care Patient      Patient will benefit from skilled therapeutic intervention in order to improve the following deficits and impairments:  Pain, Decreased strength, Decreased mobility, Decreased balance, Decreased activity tolerance, Abnormal gait, Difficulty walking  Visit Diagnosis: Midline low back pain without sciatica  Difficulty in walking, not elsewhere classified     Problem List Patient Active Problem List   Diagnosis Date Noted  . Postconcussive syndrome 04/01/2015  . Neck pain on left side 04/01/2015  . Brain aneurysm 01/16/2015  . Hyperlipidemia, mild 10/08/2014  . Osteopenia 10/08/2014  . Abdominal pain 08/25/2014  . Peripheral edema 08/25/2014  . Dehydration 08/25/2014  . History of DVT (deep vein thrombosis) 08/14/2014  . Anemia   . GERD (gastroesophageal reflux disease)   . Bilateral dry eyes   . History of chicken pox   . Adiposity 07/28/2013  . Esophagitis 09/13/2010  . Shortness of breath 06/16/2010  . Chest pain, unspecified 06/16/2010  . Osteoarthrosis involving more than one site but not generalized 05/30/2010  . Aortic valve disorder 12/01/2008  . CAROTID STENOSIS 12/01/2008  . HIP PAIN 12/01/2008  . HAMMER TOE 11/27/2008  . Palpitations 11/27/2008  . H/O cataract extraction 03/15/2007  . Benign essential HTN 04/17/2006  . Buzzing in ear 04/12/2006  . History of artificial joint 03/17/2005   . Leg pain 10/17/2004  . Arthritis, degenerative 05/21/2002  . Chronic venous insufficiency 05/21/2002  . Bone/cartilage disorder 04/26/2001  . 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  [address] [place], [place], [address] Phone: [phone]   Fax:  [phone]  Name: Mikayla [name] MRN: [id number] Date of Birth: 09-03-1940

## 2015-06-01 ENCOUNTER — Encounter: Payer: Self-pay | Admitting: Family Medicine

## 2015-06-01 ENCOUNTER — Ambulatory Visit (INDEPENDENT_AMBULATORY_CARE_PROVIDER_SITE_OTHER): Payer: Medicare Other | Admitting: Family Medicine

## 2015-06-01 VITALS — BP 136/72 | HR 70 | Temp 97.6°F | Ht 65.0 in | Wt 173.1 lb

## 2015-06-01 DIAGNOSIS — D6489 Other specified anemias: Secondary | ICD-10-CM

## 2015-06-01 DIAGNOSIS — K219 Gastro-esophageal reflux disease without esophagitis: Secondary | ICD-10-CM | POA: Diagnosis not present

## 2015-06-01 DIAGNOSIS — M542 Cervicalgia: Secondary | ICD-10-CM

## 2015-06-01 DIAGNOSIS — E785 Hyperlipidemia, unspecified: Secondary | ICD-10-CM | POA: Diagnosis not present

## 2015-06-01 MED ORDER — WARFARIN SODIUM 5 MG PO TABS: ORAL_TABLET | ORAL | Status: DC

## 2015-06-01 NOTE — Progress Notes (Signed)
Subjective:    Patient ID: Mikayla [name], female    DOB: Jan 04, 1941, 75 y.o.   MRN: [id number]  Chief Complaint  Patient presents with  . Follow-up    HPI Patient is in today for follow up. Patient reports having some concerns with ongoing headaches. Patient was seeing Md at [facility] neurology and Md prescribed desipramine but does not feel it helps, had 8 headaches in march and began using tramadol and it improved headaches to about 4 headaches in  April.  Denies CP/palp/SOB/congestion/fevers/GI or GU c/o. Taking meds as prescribed.    Past Medical History  Diagnosis Date  . Acute venous embolism and thrombosis of unspecified deep vessels of lower extremity     after broken foot screw left in place, left leg  . Aortic stenosis     echo 11/11: EF 60%, mild LVH, mod/severe AS with mean 32 mmHg, peak 55 mmHg, mild AI, mild MR, (previous echo 2010 with mean gradient 29 mmHg)  . Carotid stenosis     dopplers 11/11: 0-39% bilat  . Palpitations     PACs and atrial tachy  . Other congenital anomaly of toes   . Pain in joint, pelvic region and thigh   . Atrial tachycardia ([place])   . Dyslipidemia   . Anemia   . GERD (gastroesophageal reflux disease)   . Bilateral dry eyes   . Hyperlipidemia   . History of chicken pox   . H/O measles   . Abdominal pain 08/25/2014  . Peripheral edema 08/25/2014  . Dehydration 08/25/2014  . Hyperlipidemia, mild 10/08/2014  . Osteopenia 10/08/2014  . Brain aneurysm 01/16/2015    Past Surgical History  Procedure Laterality Date  . Tonsillectomy    . Radical hysterectomy    . Foot surgery    . Left hip replacement    . Hyroglossal duct cyst resection    . Left and right heart catheterization with coronary angiogram N/A 03/21/2013    Procedure: LEFT AND RIGHT HEART CATHETERIZATION WITH CORONARY ANGIOGRAM;  Surgeon: Minus Breeding, MD;  Location: [facility] CATH LAB;  Service: Cardiovascular;  Laterality: N/A;  . Cholecystectomy      Family History  Problem  Relation Age of Onset  . Heart disease Mother   . Hypertension Mother 68  . Heart disease Father 15  . Cancer Maternal Aunt     Breast  . Heart disease Maternal Grandmother   . Heart disease Maternal Grandfather   . Hernia Daughter   . Gallstones Daughter     Social History   Social History  . Marital Status: Divorced    Spouse Name: N/A  . Number of Children: N/A  . Years of Education: N/A   Occupational History  . Not on file.   Social History Main Topics  . Smoking status: Never Smoker   . Smokeless tobacco: Not on file  . Alcohol Use: No  . Drug Use: No  . 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 Not on file   Social History Narrative    Outpatient Prescriptions Prior to Visit  Medication Sig Dispense Refill  . acetaminophen (TYLENOL) 500 MG tablet Take 1 tablet by mouth every 8 (eight) hours as needed for mild pain or headache.     . calcium citrate (CALCITRATE - DOSED IN MG ELEMENTAL CALCIUM) 950 MG tablet Take 200 mg of elemental calcium by mouth daily.    Marland Kitchen  desipramine (NOPRAMIN) 10 MG tablet Take 3 tablets by mouth daily    . diltiazem (CARTIA XT) 240 MG 24 hr capsule Take 1 capsule (240 mg total) by mouth daily. 90 capsule 1  . furosemide (LASIX) 20 MG tablet Take 1 tablet (20 mg total) by mouth daily as needed for edema. 30 tablet 1  . gabapentin (NEURONTIN) 800 MG tablet TAKE 1 TABLET (800 MG TOTAL) BY MOUTH 3 (THREE) TIMES DAILY. 270 tablet 0  . methocarbamol (ROBAXIN) 500 MG tablet Take 500 mg by mouth daily.     . Multiple Vitamin (MULTIVITAMIN) tablet Take 1 tablet by mouth daily.      . Omega 3 1000 MG CAPS Take 1,000 mg by mouth daily. Reported on 03/24/2015    . omeprazole (PRILOSEC) 40 MG capsule Take 1 capsule (40 mg total) by mouth daily. 30 capsule 6  . Polyethyl Glycol-Propyl Glycol (SYSTANE OP) Apply 1 drop to eye 2 (two) times daily as needed. For dry eyes    .  traMADol (ULTRAM) 50 MG tablet Take 1 tablet (50 mg total) by mouth every 8 (eight) hours as needed. 40 tablet 0  . metoprolol tartrate (LOPRESSOR) 25 MG tablet Take 25 mg by mouth 2 (two) times daily.     Marland Kitchen warfarin (COUMADIN) 5 MG tablet TAKE 1 TABLET (5 MG TOTAL) BY MOUTH DAILY. TAKES 2.5MG  M, W, F AND OTHER DAYS 5MG  30 tablet 3  . hyoscyamine (LEVSIN SL) 0.125 MG SL tablet Place 1 tablet (0.125 mg total) under the tongue every 4 (four) hours as needed. (Patient not taking: Reported on 06/01/2015) 30 tablet 0   No facility-administered medications prior to visit.    Allergies  Allergen Reactions  . Codeine Nausea And Vomiting  . Indomethacin Nausea And Vomiting    Caused chest pain.    Review of Systems  Constitutional: Negative for fever and malaise/fatigue.  HENT: Negative for congestion.   Eyes: Negative for blurred vision.  Respiratory: Negative for shortness of breath.   Cardiovascular: Negative for chest pain, palpitations and leg swelling.  Gastrointestinal: Negative for nausea, abdominal pain and blood in stool.  Genitourinary: Negative for dysuria and frequency.  Musculoskeletal: Negative for falls.  Skin: Negative for rash.  Neurological: Negative for dizziness, loss of consciousness and headaches.  Endo/Heme/Allergies: Negative for environmental allergies.  Psychiatric/Behavioral: Negative for depression. The patient is not nervous/anxious.        Objective:    Physical Exam  Constitutional: She is oriented to person, place, and time. She appears well-developed and well-nourished. No distress.  HENT:  Head: Normocephalic and atraumatic.  Eyes: Conjunctivae are normal.  Neck: Neck supple. No thyromegaly present.  Cardiovascular: Normal rate, regular rhythm and normal heart sounds.   No murmur heard. Pulmonary/Chest: Effort normal and breath sounds normal. No respiratory distress.  Abdominal: Soft. Bowel sounds are normal. She exhibits no distension and no mass.  There is no tenderness.  Musculoskeletal: She exhibits no edema.  Lymphadenopathy:    She has no cervical adenopathy.  Neurological: She is alert and oriented to person, place, and time.  Skin: Skin is warm and dry.  Psychiatric: She has a normal mood and affect. Her behavior is normal.    BP 136/72 mmHg  Pulse 70  Temp(Src) 97.6 F (36.4 C) (Oral)  Ht 5\' 5"  (1.651 m)  Wt 173 lb 2 oz (78.529 kg)  BMI 28.81 kg/m2  SpO2 95% Wt Readings from Last 3 Encounters:  06/01/15 173 lb 2 oz (78.529 kg)  04/01/15 176 lb 2 oz (79.89 kg)  01/15/15 175 lb (79.379 kg)     Lab Results  Component Value Date   WBC 6.3 01/15/2015   HGB 13.0 01/15/2015   HCT 38.9 01/15/2015   PLT 187 01/15/2015   GLUCOSE 101* 01/15/2015   CHOL 199 01/07/2015   TRIG 88.0 01/07/2015   HDL 57.30 01/07/2015   LDLCALC 124* 01/07/2015   ALT 18 01/07/2015   AST 18 01/07/2015   NA 141 01/15/2015   K 3.8 01/15/2015   CL 104 01/15/2015   CREATININE 0.67 01/15/2015   BUN 12 01/15/2015   CO2 28 01/15/2015   TSH 1.90 01/07/2015   INR 2.9 06/09/2015    Lab Results  Component Value Date   TSH 1.90 01/07/2015   Lab Results  Component Value Date   WBC 6.3 01/15/2015   HGB 13.0 01/15/2015   HCT 38.9 01/15/2015   MCV 93.7 01/15/2015   PLT 187 01/15/2015   Lab Results  Component Value Date   NA 141 01/15/2015   K 3.8 01/15/2015   CO2 28 01/15/2015   GLUCOSE 101* 01/15/2015   BUN 12 01/15/2015   CREATININE 0.67 01/15/2015   BILITOT 0.6 01/07/2015   ALKPHOS 76 01/07/2015   AST 18 01/07/2015   ALT 18 01/07/2015   PROT 6.9 01/07/2015   ALBUMIN 3.9 01/07/2015   CALCIUM 9.1 01/15/2015   ANIONGAP 9 01/15/2015   GFR 94.13 01/07/2015   Lab Results  Component Value Date   CHOL 199 01/07/2015   Lab Results  Component Value Date   HDL 57.30 01/07/2015   Lab Results  Component Value Date   LDLCALC 124* 01/07/2015   Lab Results  Component Value Date   TRIG 88.0 01/07/2015   Lab Results    Component Value Date   CHOLHDL 3 01/07/2015   No results found for: HGBA1C     Assessment & Plan:   Problem List Items Addressed This Visit    Neck pain on left side    Uses Tramadol infrequently but has found it helpful. Refill given. PT is also helping.       Hyperlipidemia, mild    Encouraged heart healthy diet, increase exercise, avoid trans fats, consider a krill oil cap daily      Relevant Medications   warfarin (COUMADIN) 5 MG tablet   GERD (gastroesophageal reflux disease) - Primary    Avoid offending foods, start probiotics. Do not eat large meals in late evening and consider raising head of bed.       Relevant Medications   Probiotic Product ([facility] PO)   Anemia    Increase leafy greens, consider increased lean red meat and using cast iron cookware. Continue to monitor, report any concerns         I am having Ms. Ferrone maintain her multivitamin, Omega 3, Polyethyl Glycol-Propyl Glycol (SYSTANE OP), acetaminophen, hyoscyamine, furosemide, calcium citrate, omeprazole, methocarbamol, diltiazem, traMADol, desipramine, gabapentin, Probiotic Product ([place]), and warfarin.  Meds ordered this encounter  Medications  . Probiotic Product ([facility] PO)    Sig: Take 1 capsule by mouth daily.  Marland Kitchen DISCONTD: warfarin (COUMADIN) 5 MG tablet    Sig: Take daily as directed.    Dispense:  30 tablet    Refill:  3    5 mg daily 06-01-15  . warfarin (COUMADIN) 5 MG tablet    Sig: Take daily as directed.    Dispense:  30 tablet  Refill:  5    5 mg daily 06-01-15     Penni Homans, MD

## 2015-06-01 NOTE — Progress Notes (Signed)
Pre visit review using our clinic review tool, if applicable. No additional management support is needed unless otherwise documented below in the visit note. 

## 2015-06-01 NOTE — Patient Instructions (Addendum)
Add 1000 units of Vitamin D daily.   Hypertension Hypertension, commonly called high blood pressure, is when the force of blood pumping through your arteries is too strong. Your arteries are the blood vessels that carry blood from your heart throughout your body. A blood pressure reading consists of a higher number over a lower number, such as 110/72. The higher number (systolic) is the pressure inside your arteries when your heart pumps. The lower number (diastolic) is the pressure inside your arteries when your heart relaxes. Ideally you want your blood pressure below 120/80. Hypertension forces your heart to work harder to pump blood. Your arteries may become narrow or stiff. Having untreated or uncontrolled hypertension can cause heart attack, stroke, kidney disease, and other problems. RISK FACTORS Some risk factors for high blood pressure are controllable. Others are not.  Risk factors you cannot control include:   Race. You may be at higher risk if you are African American.  Age. Risk increases with age.  Gender. Men are at higher risk than women before age 50 years. After age 66, women are at higher risk than men. Risk factors you can control include:  Not getting enough exercise or physical activity.  Being overweight.  Getting too much fat, sugar, calories, or salt in your diet.  Drinking too much alcohol. SIGNS AND SYMPTOMS Hypertension does not usually cause signs or symptoms. Extremely high blood pressure (hypertensive crisis) may cause headache, anxiety, shortness of breath, and nosebleed. DIAGNOSIS To check if you have hypertension, your health care provider will measure your blood pressure while you are seated, with your arm held at the level of your heart. It should be measured at least twice using the same arm. Certain conditions can cause a difference in blood pressure between your right and left arms. A blood pressure reading that is higher than normal on one occasion  does not mean that you need treatment. If it is not clear whether you have high blood pressure, you may be asked to return on a different day to have your blood pressure checked again. Or, you may be asked to monitor your blood pressure at home for 1 or more weeks. TREATMENT Treating high blood pressure includes making lifestyle changes and possibly taking medicine. Living a healthy lifestyle can help lower high blood pressure. You may need to change some of your habits. Lifestyle changes may include:  Following the DASH diet. This diet is high in fruits, vegetables, and whole grains. It is low in salt, red meat, and added sugars.  Keep your sodium intake below 2,300 mg per day.  Getting at least 30-45 minutes of aerobic exercise at least 4 times per week.  Losing weight if necessary.  Not smoking.  Limiting alcoholic beverages.  Learning ways to reduce stress. Your health care provider may prescribe medicine if lifestyle changes are not enough to get your blood pressure under control, and if one of the following is true:  You are 30-28 years of age and your systolic blood pressure is above 140.  You are 7 years of age or older, and your systolic blood pressure is above 150.  Your diastolic blood pressure is above 90.  You have diabetes, and your systolic blood pressure is over [id number] or your diastolic blood pressure is over 90.  You have kidney disease and your blood pressure is above 140/90.  You have heart disease and your blood pressure is above 140/90. Your personal target blood pressure may vary depending on your medical  conditions, your age, and other factors. HOME CARE INSTRUCTIONS  Have your blood pressure rechecked as directed by your health care provider.   Take medicines only as directed by your health care provider. Follow the directions carefully. Blood pressure medicines must be taken as prescribed. The medicine does not work as well when you skip doses. Skipping  doses also puts you at risk for problems.  Do not smoke.   Monitor your blood pressure at home as directed by your health care provider. SEEK MEDICAL CARE IF:   You think you are having a reaction to medicines taken.  You have recurrent headaches or feel dizzy.  You have swelling in your ankles.  You have trouble with your vision. SEEK IMMEDIATE MEDICAL CARE IF:  You develop a severe headache or confusion.  You have unusual weakness, numbness, or feel faint.  You have severe chest or abdominal pain.  You vomit repeatedly.  You have trouble breathing. MAKE SURE YOU:   Understand these instructions.  Will watch your condition.  Will get help right away if you are not doing well or get worse.   This information is not intended to replace advice given to you by your health care provider. Make sure you discuss any questions you have with your health care provider.   Document Released: 01/23/2005 Document Revised: 06/09/2014 Document Reviewed: 11/15/2012 Elsevier Interactive Patient Education [employer].

## 2015-06-02 ENCOUNTER — Ambulatory Visit: Payer: Medicare Other | Admitting: Physical Therapy

## 2015-06-02 DIAGNOSIS — M6289 Other specified disorders of muscle: Secondary | ICD-10-CM | POA: Diagnosis not present

## 2015-06-02 DIAGNOSIS — R262 Difficulty in walking, not elsewhere classified: Secondary | ICD-10-CM

## 2015-06-02 DIAGNOSIS — M545 Low back pain, unspecified: Secondary | ICD-10-CM

## 2015-06-02 DIAGNOSIS — R293 Abnormal posture: Secondary | ICD-10-CM | POA: Diagnosis not present

## 2015-06-02 DIAGNOSIS — R269 Unspecified abnormalities of gait and mobility: Secondary | ICD-10-CM | POA: Diagnosis not present

## 2015-06-02 DIAGNOSIS — R2689 Other abnormalities of gait and mobility: Secondary | ICD-10-CM

## 2015-06-02 NOTE — Therapy (Signed)
[facility] [address]  [place], [place], [address] Phone: [phone]   Fax:  [phone]  Physical Therapy Treatment  Patient Details  Name: Mikayla [name] MRN: [id number] Date of Birth: 03-01-1940 Referring Provider: Consuella Lose, MD  Encounter Date: 06/02/2015      PT End of Session - 06/02/15 1412    Visit Number 12   Number of Visits 24   Date for PT Re-Evaluation 06/30/15   PT Start Time 1410   PT Stop Time 1448   PT Time Calculation (min) 38 min      Past Medical History  Diagnosis Date  . Acute venous embolism and thrombosis of unspecified deep vessels of lower extremity     after broken foot screw left in place, left leg  . Aortic stenosis     echo 11/11: EF 60%, mild LVH, mod/severe AS with mean 32 mmHg, peak 55 mmHg, mild AI, mild MR, (previous echo 2010 with mean gradient 29 mmHg)  . Carotid stenosis     dopplers 11/11: 0-39% bilat  . Palpitations     PACs and atrial tachy  . Other congenital anomaly of toes   . Pain in joint, pelvic region and thigh   . Atrial tachycardia ([place])   . Dyslipidemia   . Anemia   . GERD (gastroesophageal reflux disease)   . Bilateral dry eyes   . Hyperlipidemia   . History of chicken pox   . H/O measles   . Abdominal pain 08/25/2014  . Peripheral edema 08/25/2014  . Dehydration 08/25/2014  . Hyperlipidemia, mild 10/08/2014  . Osteopenia 10/08/2014  . Brain aneurysm 01/16/2015    Past Surgical History  Procedure Laterality Date  . Tonsillectomy    . Radical hysterectomy    . Foot surgery    . Left hip replacement    . Hyroglossal duct cyst resection    . Left and right heart catheterization with coronary angiogram N/A 03/21/2013    Procedure: LEFT AND RIGHT HEART CATHETERIZATION WITH CORONARY ANGIOGRAM;  Surgeon: Minus Breeding, MD;  Location: [facility] CATH LAB;  Service: Cardiovascular;  Laterality: N/A;  . Cholecystectomy      There were no vitals filed for  this visit.      Subjective Assessment - 06/02/15 1412    Subjective don't have that much pain anymore; "marvelous to be able to walk through grocery store without hurting"; states is peforming HEP; states still notes pain but less frequent and less intense.  Rates pain 2-3/10 today.   Currently in Pain? Yes   Pain Score --  2-3/10   Pain Location Back   Pain Orientation Lower            [facility] PT Assessment - 06/02/15 0001    Strength   Right Hip Flexion 4+/5   Right Hip Extension 3/5  estimate - is able to perform bridge, difficult   Right Hip External Rotation  4/5   Right Hip Internal Rotation 4+/5   Right Hip ABduction 3+/5   Left Hip Flexion 4/5   Left Hip Extension 3/5  estimate - able to perform bridge but is difficult   Left Hip External Rotation 3+/5   Left Hip Internal Rotation 4/5   Left Hip ABduction 3-/5   Left Hip ADduction 2+/5  tested supine   Right Knee Flexion 4+/5   Right Knee Extension 5/5   Left Knee Flexion 4+/5  mild L knee pain   Left Knee  Extension 4/5  mild L knee pain            TODAY'S TREATMENT B LE MMT assessment  TherEx- Partial curl-up 10x Diagonal partial curl-up with hip flexion and UE reaching to outside of knee 10x each Standing Hip ABD Yellow TB at ankles 8x each Standing Hip EXT Yellow TB at ankles 8x each Standing Hip Flexion Yellow TB at ankles 10 each           PT Short Term Goals - 04/07/15 1504    PT SHORT TERM GOAL #1   Title pt independent with initial HEP by 04/05/15   Status Achieved           PT Long Term Goals - 06/02/15 1623    PT LONG TERM GOAL #1   Title pt reports decreased LBP to 2/10 or less on average by 06/30/15  much improved and AVG 2-3/10 lately.   Status Partially Met   PT LONG TERM GOAL #2   Title pt displays B Hip MMT 4/5 or better grossly by 06/30/15  this too has progressed quite well but still weak with B Hip ABD and EXT.   Status Partially Met   PT LONG TERM GOAL #3    Title pt able to stand and ambulate up to 60 minutes without limitation by LBP or LE weakness by 06/30/15  with use of shopping cart while shopping   Status Partially Met   PT LONG TERM GOAL #4   Title pt able to perform transfers (sit->stand and car transfers) with minimal to no need for UE use   Status Achieved               Plan - 06/02/15 1626    Clinical Impression Statement Ms. Ney has progressed exceptionally well to date with regard to level of function and lower back pain.  She is very pleased with her improved level of function and states is able to go grocery shopping without limitation by LBP.  She rates pain 2-3/10 lately when present.  There is still significant weakness in B hips and this weakness forces her to ambulate with some form of walking aide outside of household distances ([place] typically, but uses shopping cart while shopping).  In addition, her hip weakness prevents normal gait mechanics at this time and this too can contribute to continued LBP. She'd like to continue with PT to address her remaining pain and weakness and I think we can continue to offer her benefit. I am therefore recommending continued PT at 2x/wk for 4 more weeks.   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   PT Frequency 2x / week   PT Duration 4 weeks   PT Treatment/Interventions Therapeutic exercise;Manual techniques;Therapeutic activities;Functional mobility training;Electrical Stimulation;Moist Heat;Cryotherapy;Gait training;Patient/family education;Neuromuscular re-education;Balance training;Stair training   PT Next Visit Plan Lumbopelvic stability training as tolerated; Hip strengthening as able; begin more gait / balance training as able   Consulted and Agree with Plan of Care Patient      Patient will benefit from skilled therapeutic intervention in order to improve the following  deficits and impairments:  Pain, Decreased strength, Decreased mobility, Decreased balance, Decreased activity tolerance, Abnormal gait, Difficulty walking  Visit Diagnosis: Midline low back pain without sciatica - Plan: PT plan of care cert/re-cert  Difficulty in walking, not elsewhere classified - Plan: PT plan of care cert/re-cert  Other abnormalities of gait and mobility -  Plan: PT plan of care cert/re-cert     Problem List Patient Active Problem List   Diagnosis Date Noted  . Postconcussive syndrome 04/01/2015  . Neck pain on left side 04/01/2015  . Brain aneurysm 01/16/2015  . Hyperlipidemia, mild 10/08/2014  . Osteopenia 10/08/2014  . Abdominal pain 08/25/2014  . Peripheral edema 08/25/2014  . Dehydration 08/25/2014  . History of DVT (deep vein thrombosis) 08/14/2014  . Anemia   . GERD (gastroesophageal reflux disease)   . Bilateral dry eyes   . History of chicken pox   . Adiposity 07/28/2013  . Esophagitis 09/13/2010  . Shortness of breath 06/16/2010  . Chest pain, unspecified 06/16/2010  . Osteoarthrosis involving more than one site but not generalized 05/30/2010  . Aortic valve disorder 12/01/2008  . CAROTID STENOSIS 12/01/2008  . HIP PAIN 12/01/2008  . HAMMER TOE 11/27/2008  . Palpitations 11/27/2008  . H/O cataract extraction 03/15/2007  . Benign essential HTN 04/17/2006  . Buzzing in ear 04/12/2006  . History of artificial joint 03/17/2005  . Leg pain 10/17/2004  . Arthritis, degenerative 05/21/2002  . Chronic venous insufficiency 05/21/2002  . Bone/cartilage disorder 04/26/2001  . 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

## 2015-06-09 ENCOUNTER — Ambulatory Visit (INDEPENDENT_AMBULATORY_CARE_PROVIDER_SITE_OTHER): Payer: Medicare Other | Admitting: *Deleted

## 2015-06-09 ENCOUNTER — Ambulatory Visit: Payer: Medicare Other

## 2015-06-09 ENCOUNTER — Ambulatory Visit: Payer: Medicare Other | Attending: Neurosurgery | Admitting: Physical Therapy

## 2015-06-09 DIAGNOSIS — R262 Difficulty in walking, not elsewhere classified: Secondary | ICD-10-CM | POA: Insufficient documentation

## 2015-06-09 DIAGNOSIS — M545 Low back pain, unspecified: Secondary | ICD-10-CM

## 2015-06-09 DIAGNOSIS — R2689 Other abnormalities of gait and mobility: Secondary | ICD-10-CM | POA: Insufficient documentation

## 2015-06-09 DIAGNOSIS — Z86718 Personal history of other venous thrombosis and embolism: Secondary | ICD-10-CM

## 2015-06-09 LAB — POCT INR: INR: 2.9

## 2015-06-09 NOTE — Therapy (Signed)
[facility] [address]  [place], [place], [address] Phone: [phone]   Fax:  [phone]  Physical Therapy Treatment  Patient Details  Name: Mikayla [name] MRN: [id number] Date of Birth: 1940/07/09 Referring Provider: Consuella Lose, MD  Encounter Date: 06/09/2015      PT End of Session - 06/09/15 1020    Visit Number 13   Number of Visits 20   Date for PT Re-Evaluation 06/30/15   PT Start Time 1018   PT Stop Time 1101   PT Time Calculation (min) 43 min      Past Medical History  Diagnosis Date  . Acute venous embolism and thrombosis of unspecified deep vessels of lower extremity     after broken foot screw left in place, left leg  . Aortic stenosis     echo 11/11: EF 60%, mild LVH, mod/severe AS with mean 32 mmHg, peak 55 mmHg, mild AI, mild MR, (previous echo 2010 with mean gradient 29 mmHg)  . Carotid stenosis     dopplers 11/11: 0-39% bilat  . Palpitations     PACs and atrial tachy  . Other congenital anomaly of toes   . Pain in joint, pelvic region and thigh   . Atrial tachycardia ([place])   . Dyslipidemia   . Anemia   . GERD (gastroesophageal reflux disease)   . Bilateral dry eyes   . Hyperlipidemia   . History of chicken pox   . H/O measles   . Abdominal pain 08/25/2014  . Peripheral edema 08/25/2014  . Dehydration 08/25/2014  . Hyperlipidemia, mild 10/08/2014  . Osteopenia 10/08/2014  . Brain aneurysm 01/16/2015    Past Surgical History  Procedure Laterality Date  . Tonsillectomy    . Radical hysterectomy    . Foot surgery    . Left hip replacement    . Hyroglossal duct cyst resection    . Left and right heart catheterization with coronary angiogram N/A 03/21/2013    Procedure: LEFT AND RIGHT HEART CATHETERIZATION WITH CORONARY ANGIOGRAM;  Surgeon: Minus Breeding, MD;  Location: [facility] CATH LAB;  Service: Cardiovascular;  Laterality: N/A;  . Cholecystectomy      There were no vitals filed for  this visit.      Subjective Assessment - 06/09/15 1032    Subjective states heel lift put in L shoe last treatment has "really helped" and states LBP 1-2/10. Has been performing HEP.   Currently in Pain? Yes   Pain Score --  1-2/10   Pain Location Back   Pain Orientation Lower           TODAY'S TREATMENT TherEx - NuStep lvl 4, 4' Standing in/out of [place] for Hip ABD strengthening 15x each with Single Pole A Standing Hip Extension Yellow TB at ankles 15x each with Single Pole A Foam Pad March in place with Single Pole A 10x each CGA (difficulty with first 3-4 reps but then was able to perform much better) Standing Fitter Hip Extension 2 Blue 10x each with B Pole A (noted B knee pain with this, difficulty getting full ROM with B LE due to hip weakness) Seated Fitter SL Leg Press 2 Blue 15x; 1 Black + 1 Blue 15x each Bridge 12x Diagonal curl-up UE reaching to outside of contra LE with LE March 10x each Pelvic Tilt 10x5" Hoolying Upper Trunk Rotation stability training with 2kg ball in hands moving left/right 8x2" each  PT Short Term Goals - 04/07/15 1504    PT SHORT TERM GOAL #1   Title pt independent with initial HEP by 04/05/15   Status Achieved           PT Long Term Goals - 06/02/15 1623    PT LONG TERM GOAL #1   Title pt reports decreased LBP to 2/10 or less on average by 06/30/15  much improved and AVG 2-3/10 lately.   Status Partially Met   PT LONG TERM GOAL #2   Title pt displays B Hip MMT 4/5 or better grossly by 06/30/15  this too has progressed quite well but still weak with B Hip ABD and EXT.   Status Partially Met   PT LONG TERM GOAL #3   Title pt able to stand and ambulate up to 60 minutes without limitation by LBP or LE weakness by 06/30/15  with use of shopping cart while shopping   Status Partially Met   PT LONG TERM GOAL #4   Title pt able to perform transfers (sit->stand and car transfers) with minimal to no need for UE  use   Status Achieved               Plan - 06/09/15 1058    Clinical Impression Statement she reports decreased LBP with use of heel lift to L LE last treatment stating back pain down to 1-2/10. Today's treatment focused on hip and core stability/strengthening with good tolerance other than c/o B knee pain with use of Fitter for hip extension.   PT Next Visit Plan Lumbopelvic stability training as tolerated; Hip strengthening as able; begin more gait / balance training as able   Consulted and Agree with Plan of Care Patient      Patient will benefit from skilled therapeutic intervention in order to improve the following deficits and impairments:  Pain, Decreased strength, Decreased mobility, Decreased balance, Decreased activity tolerance, Abnormal gait, Difficulty walking  Visit Diagnosis: Midline low back pain without sciatica  Difficulty in walking, not elsewhere classified  Other abnormalities of gait and mobility     Problem List Patient Active Problem List   Diagnosis Date Noted  . Postconcussive syndrome 04/01/2015  . Neck pain on left side 04/01/2015  . Brain aneurysm 01/16/2015  . Hyperlipidemia, mild 10/08/2014  . Osteopenia 10/08/2014  . Abdominal pain 08/25/2014  . Peripheral edema 08/25/2014  . Dehydration 08/25/2014  . History of DVT (deep vein thrombosis) 08/14/2014  . Anemia   . GERD (gastroesophageal reflux disease)   . Bilateral dry eyes   . History of chicken pox   . Adiposity 07/28/2013  . Esophagitis 09/13/2010  . Shortness of breath 06/16/2010  . Chest pain, unspecified 06/16/2010  . Osteoarthrosis involving more than one site but not generalized 05/30/2010  . Aortic valve disorder 12/01/2008  . CAROTID STENOSIS 12/01/2008  . HIP PAIN 12/01/2008  . HAMMER TOE 11/27/2008  . Palpitations 11/27/2008  . H/O cataract extraction 03/15/2007  . Benign essential HTN 04/17/2006  . Buzzing in ear 04/12/2006  . History of artificial joint  03/17/2005  . Leg pain 10/17/2004  . Arthritis, degenerative 05/21/2002  . Chronic venous insufficiency 05/21/2002  . Bone/cartilage disorder 04/26/2001  . 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  [place], [place], [address] Phone: [phone]   Fax:  [phone]  Name: Mikayla [name] MRN:  [id number] Date of Birth: [birth year]

## 2015-06-09 NOTE — Patient Instructions (Signed)
Per Elyn Aquas, PA-C: INR better today. Take Coumadin 5 mg daily and return for INR check in 2 weeks.

## 2015-06-09 NOTE — Progress Notes (Signed)
Pre visit review using our clinic review tool, if applicable. No additional management support is needed unless otherwise documented below in the visit note.  INR today 2.9. Pt findings negative.   Per Elyn Aquas, PA-C: INR better today. Take Coumadin 5 mg daily and return for INR check in 2 weeks.    Pt verbalized understanding of instructions.   Next appt: 06/23/15  Dorrene German, RN

## 2015-06-14 ENCOUNTER — Ambulatory Visit: Payer: Medicare Other | Admitting: Physical Therapy

## 2015-06-14 ENCOUNTER — Other Ambulatory Visit: Payer: Self-pay | Admitting: Family Medicine

## 2015-06-14 DIAGNOSIS — R2689 Other abnormalities of gait and mobility: Secondary | ICD-10-CM

## 2015-06-14 DIAGNOSIS — M545 Low back pain, unspecified: Secondary | ICD-10-CM

## 2015-06-14 DIAGNOSIS — R262 Difficulty in walking, not elsewhere classified: Secondary | ICD-10-CM | POA: Diagnosis not present

## 2015-06-14 MED ORDER — METOPROLOL TARTRATE 25 MG PO TABS: 25.0000 mg | ORAL_TABLET | Freq: Two times a day (BID) | ORAL | Status: DC

## 2015-06-14 NOTE — Assessment & Plan Note (Signed)
Avoid offending foods, start probiotics. Do not eat large meals in late evening and consider raising head of bed.  

## 2015-06-14 NOTE — Assessment & Plan Note (Signed)
Increase leafy greens, consider increased lean red meat and using cast iron cookware. Continue to monitor, report any concerns 

## 2015-06-14 NOTE — Assessment & Plan Note (Signed)
Uses Tramadol infrequently but has found it helpful. Refill given. PT is also helping.

## 2015-06-14 NOTE — Therapy (Signed)
[facility] [address]  [place], [place], [address] Phone: [phone]   Fax:  [phone]  Physical Therapy Treatment  Patient Details  Name: Mikayla [name] MRN: [id number] Date of Birth: 1940-09-13 Referring Provider: Consuella Lose, MD  Encounter Date: 06/14/2015      PT End of Session - 06/14/15 1414    Visit Number 14   Number of Visits 20   Date for PT Re-Evaluation 06/30/15   PT Start Time 9470   PT Stop Time 1446   PT Time Calculation (min) 38 min      Past Medical History  Diagnosis Date  . Acute venous embolism and thrombosis of unspecified deep vessels of lower extremity     after broken foot screw left in place, left leg  . Aortic stenosis     echo 11/11: EF 60%, mild LVH, mod/severe AS with mean 32 mmHg, peak 55 mmHg, mild AI, mild MR, (previous echo 2010 with mean gradient 29 mmHg)  . Carotid stenosis     dopplers 11/11: 0-39% bilat  . Palpitations     PACs and atrial tachy  . Other congenital anomaly of toes   . Pain in joint, pelvic region and thigh   . Atrial tachycardia ([place])   . Dyslipidemia   . Anemia   . GERD (gastroesophageal reflux disease)   . Bilateral dry eyes   . Hyperlipidemia   . History of chicken pox   . H/O measles   . Abdominal pain 08/25/2014  . Peripheral edema 08/25/2014  . Dehydration 08/25/2014  . Hyperlipidemia, mild 10/08/2014  . Osteopenia 10/08/2014  . Brain aneurysm 01/16/2015    Past Surgical History  Procedure Laterality Date  . Tonsillectomy    . Radical hysterectomy    . Foot surgery    . Left hip replacement    . Hyroglossal duct cyst resection    . Left and right heart catheterization with coronary angiogram N/A 03/21/2013    Procedure: LEFT AND RIGHT HEART CATHETERIZATION WITH CORONARY ANGIOGRAM;  Surgeon: Minus Breeding, MD;  Location: [facility] CATH LAB;  Service: Cardiovascular;  Laterality: N/A;  . Cholecystectomy      There were no vitals filed for  this visit.      Subjective Assessment - 06/14/15 1414    Subjective States lower back pain has increased since last week which she believes is due to reaching down to pick up item from floor a few days ago. She states her pain was about 5/10 with activity over the weekend. States is currently pain-free due to taking pain medication earlier today.   Currently in Pain? Yes   Pain Score --  currently no pain due to taking medication 2 hours ago, 5/10 earlier            TODAY'S TREATMENT TherEx - NuStep lvl 4, 4' Seated hands on PBall rollout (FW and diagonal) for trunk ROM 5x each Seated Black TB at knees Unilateral Hip ABD 10x each (both difficult) Seated Unilateral Hip ADD Black TB 12x each Hooklying LE March 6x each Hooklying SLR 6x R, 2x L then 2 more with A (first rep on L was easy and full ROM, next one was shorter ROM then unable to perform without A for any more reps) Bridge 8x Heel Slide 10x each (pretty equal ability R and L) Supine RROM Hip ABD/ADD 10x each Hookying Trunk rotation isometrics with PT resistance at knee and opposite elbow 6x5" each  PT Short Term Goals - 04/07/15 1504    PT SHORT TERM GOAL #1   Title pt independent with initial HEP by 04/05/15   Status Achieved           PT Long Term Goals - 06/02/15 1623    PT LONG TERM GOAL #1   Title pt reports decreased LBP to 2/10 or less on average by 06/30/15  much improved and AVG 2-3/10 lately.   Status Partially Met   PT LONG TERM GOAL #2   Title pt displays B Hip MMT 4/5 or better grossly by 06/30/15  this too has progressed quite well but still weak with B Hip ABD and EXT.   Status Partially Met   PT LONG TERM GOAL #3   Title pt able to stand and ambulate up to 60 minutes without limitation by LBP or LE weakness by 06/30/15  with use of shopping cart while shopping   Status Partially Met   PT LONG TERM GOAL #4   Title pt able to perform transfers (sit->stand and car transfers) with  minimal to no need for UE use   Status Achieved               Plan - 06/14/15 1448    Clinical Impression Statement noted some increase in LBP over the weekend with pt stating she feels is due to bending over to pick up dropped money on Friday. However, she was busy over the weekend going through boxes of stuff over the weekend as well so this likely contributed to her pain.  Either way, patient pain-free at start of treatment due to taking medication earlier and no c/o pain during treatment.  Significant weakness still present B hips with most profound weakness noted in L hip flexion today.  She was able to perofrm one L SLR with relative ease but then by 3rd rep required assistance due to weakness/fatigue.   PT Next Visit Plan Lumbopelvic stability training as tolerated; Hip strengthening as able; begin more gait / balance training as able   Consulted and Agree with Plan of Care Patient      Patient will benefit from skilled therapeutic intervention in order to improve the following deficits and impairments:  Pain, Decreased strength, Decreased mobility, Decreased balance, Decreased activity tolerance, Abnormal gait, Difficulty walking  Visit Diagnosis: Midline low back pain without sciatica  Difficulty in walking, not elsewhere classified  Other abnormalities of gait and mobility     Problem List Patient Active Problem List   Diagnosis Date Noted  . Postconcussive syndrome 04/01/2015  . Neck pain on left side 04/01/2015  . Brain aneurysm 01/16/2015  . Hyperlipidemia, mild 10/08/2014  . Osteopenia 10/08/2014  . Abdominal pain 08/25/2014  . Peripheral edema 08/25/2014  . Dehydration 08/25/2014  . History of DVT (deep vein thrombosis) 08/14/2014  . Anemia   . GERD (gastroesophageal reflux disease)   . Bilateral dry eyes   . History of chicken pox   . Adiposity 07/28/2013  . Esophagitis 09/13/2010  . Shortness of breath 06/16/2010  . Chest pain, unspecified 06/16/2010   . Osteoarthrosis involving more than one site but not generalized 05/30/2010  . Aortic valve disorder 12/01/2008  . CAROTID STENOSIS 12/01/2008  . HIP PAIN 12/01/2008  . HAMMER TOE 11/27/2008  . Palpitations 11/27/2008  . H/O cataract extraction 03/15/2007  . Benign essential HTN 04/17/2006  . Buzzing in ear 04/12/2006  . History of artificial joint 03/17/2005  . Leg pain 10/17/2004  .  Arthritis, degenerative 05/21/2002  . Chronic venous insufficiency 05/21/2002  . Bone/cartilage disorder 04/26/2001  . 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

## 2015-06-14 NOTE — Assessment & Plan Note (Signed)
Encouraged heart healthy diet, increase exercise, avoid trans fats, consider a krill oil cap daily 

## 2015-06-16 ENCOUNTER — Ambulatory Visit: Payer: Medicare Other | Admitting: Physical Therapy

## 2015-06-16 DIAGNOSIS — R262 Difficulty in walking, not elsewhere classified: Secondary | ICD-10-CM

## 2015-06-16 DIAGNOSIS — M545 Low back pain, unspecified: Secondary | ICD-10-CM

## 2015-06-16 DIAGNOSIS — R2689 Other abnormalities of gait and mobility: Secondary | ICD-10-CM | POA: Diagnosis not present

## 2015-06-16 NOTE — Therapy (Signed)
[facility] [place] [address]  [address] [place], [place], [address] Phone: [phone]   Fax:  [phone]  Physical Therapy Treatment  Patient Details  Name: Mikayla [name] MRN: [id number] Date of Birth: 08/02/1940 Referring Provider: Neelesh Nundkumar, MD  Encounter Date: 06/16/2015      PT End of Session - 06/16/15 1410    Visit Number 15   Number of Visits 20   Date for PT Re-Evaluation 06/30/15   PT Start Time 1405   PT Stop Time 1458   PT Time Calculation (min) 53 min      Past Medical History  Diagnosis Date  . Acute venous embolism and thrombosis of unspecified deep vessels of lower extremity     after broken foot screw left in place, left leg  . Aortic stenosis     echo 11/11: EF 60%, mild LVH, mod/severe AS with mean 32 mmHg, peak 55 mmHg, mild AI, mild MR, (previous echo 2010 with mean gradient 29 mmHg)  . Carotid stenosis     dopplers 11/11: 0-39% bilat  . Palpitations     PACs and atrial tachy  . Other congenital anomaly of toes   . Pain in joint, pelvic region and thigh   . Atrial tachycardia (HCC)   . Dyslipidemia   . Anemia   . GERD (gastroesophageal reflux disease)   . Bilateral dry eyes   . Hyperlipidemia   . History of chicken pox   . H/O measles   . Abdominal pain 08/25/2014  . Peripheral edema 08/25/2014  . Dehydration 08/25/2014  . Hyperlipidemia, mild 10/08/2014  . Osteopenia 10/08/2014  . Brain aneurysm 01/16/2015    Past Surgical History  Procedure Laterality Date  . Tonsillectomy    . Radical hysterectomy    . Foot surgery    . Left hip replacement    . Hyroglossal duct cyst resection    . Left and right heart catheterization with coronary angiogram N/A 03/21/2013    Procedure: LEFT AND RIGHT HEART CATHETERIZATION WITH CORONARY ANGIOGRAM;  Surgeon: James Hochrein, MD;  Location: [facility] CATH LAB;  Service: Cardiovascular;  Laterality: N/A;  . Cholecystectomy      There were no vitals filed for  this visit.      Subjective Assessment - 06/16/15 1411    Subjective States was hurting ealier this AM when she woke up but states is feeling good now stating LBP around 2/10 currently.   Currently in Pain? Yes   Pain Score 2    Pain Location Back   Pain Orientation Lower          TODAY'S TREATMENT TherEx - NuStep lvl 4, 4' 8" Step Toe-Tapping with Yellow TB at ankles 10x each with Single Pole A Standing Hip ABD Yellow TB at ankles 10x each with Single Pole A BOSU (Up) FW step and wt shift 6x each with B Pole A Foam Pad March in place with Single Pole A 10x each SBA (much better performance since last time) 1/2 Foam Roller side stepover 10x each with SBA 1/2 Foam Roller FW stepover 8x each with SBA Seated Fitter SL Leg Press 1 Black + 1 Blue 15x each Seated one arm diagonal row Blue TB 12x each (core stability training)                   PT Education - 06/16/15 1500    Education provided Yes   Education Details HEP for Shoulder pain   Person(s)   Educated Patient   Methods Explanation;Demonstration;Handout   Comprehension Verbalized understanding;Returned demonstration          PT Short Term Goals - 04/07/15 1504    PT SHORT TERM GOAL #1   Title pt independent with initial HEP by 04/05/15   Status Achieved           PT Long Term Goals - 06/02/15 1623    PT LONG TERM GOAL #1   Title pt reports decreased LBP to 2/10 or less on average by 06/30/15  much improved and AVG 2-3/10 lately.   Status Partially Met   PT LONG TERM GOAL #2   Title pt displays B Hip MMT 4/5 or better grossly by 06/30/15  this too has progressed quite well but still weak with B Hip ABD and EXT.   Status Partially Met   PT LONG TERM GOAL #3   Title pt able to stand and ambulate up to 60 minutes without limitation by LBP or LE weakness by 06/30/15  with use of shopping cart while shopping   Status Partially Met   PT LONG TERM GOAL #4   Title pt able to perform transfers  (sit->stand and car transfers) with minimal to no need for UE use   Status Achieved               Plan - 06/16/15 1500    Clinical Impression Statement Performed very well today and states was expecting increased pain after last treatment but didn't happen.  Able to perform standing exercises with least amount of Assist to date and no c/o pain.  Pt mentioned R shoulder pain with transfers at end of treatment today. Quick assessment indicates impingment but no concern for RC tear at this point.  Instructed in neutral RC strengthening to improve mechanics and decrease degeneration/pain.   PT Next Visit Plan Lumbopelvic stability training as tolerated; Hip strengthening as able; begin more gait / balance training as able   Consulted and Agree with Plan of Care Patient      Patient will benefit from skilled therapeutic intervention in order to improve the following deficits and impairments:  Pain, Decreased strength, Decreased mobility, Decreased balance, Decreased activity tolerance, Abnormal gait, Difficulty walking  Visit Diagnosis: Midline low back pain without sciatica  Difficulty in walking, not elsewhere classified  Other abnormalities of gait and mobility     Problem List Patient Active Problem List   Diagnosis Date Noted  . Postconcussive syndrome 04/01/2015  . Neck pain on left side 04/01/2015  . Brain aneurysm 01/16/2015  . Hyperlipidemia, mild 10/08/2014  . Osteopenia 10/08/2014  . Abdominal pain 08/25/2014  . Peripheral edema 08/25/2014  . Dehydration 08/25/2014  . History of DVT (deep vein thrombosis) 08/14/2014  . Anemia   . GERD (gastroesophageal reflux disease)   . Bilateral dry eyes   . History of chicken pox   . Adiposity 07/28/2013  . Esophagitis 09/13/2010  . Shortness of breath 06/16/2010  . Chest pain, unspecified 06/16/2010  . Osteoarthrosis involving more than one site but not generalized 05/30/2010  . Aortic valve disorder 12/01/2008  .  CAROTID STENOSIS 12/01/2008  . HIP PAIN 12/01/2008  . HAMMER TOE 11/27/2008  . Palpitations 11/27/2008  . H/O cataract extraction 03/15/2007  . Benign essential HTN 04/17/2006  . Buzzing in ear 04/12/2006  . History of artificial joint 03/17/2005  . Leg pain 10/17/2004  . Arthritis, degenerative 05/21/2002  . Chronic venous insufficiency 05/21/2002  . Bone/cartilage disorder 04/26/2001  .   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     

## 2015-06-19 ENCOUNTER — Other Ambulatory Visit: Payer: Self-pay | Admitting: Family Medicine

## 2015-06-21 ENCOUNTER — Ambulatory Visit: Payer: Medicare Other | Admitting: Physical Therapy

## 2015-06-23 ENCOUNTER — Ambulatory Visit: Payer: Medicare Other | Admitting: Physical Therapy

## 2015-06-23 ENCOUNTER — Ambulatory Visit (INDEPENDENT_AMBULATORY_CARE_PROVIDER_SITE_OTHER): Payer: Medicare Other | Admitting: Behavioral Health

## 2015-06-23 DIAGNOSIS — H01001 Unspecified blepharitis right upper eyelid: Secondary | ICD-10-CM | POA: Diagnosis not present

## 2015-06-23 DIAGNOSIS — Z86718 Personal history of other venous thrombosis and embolism: Secondary | ICD-10-CM

## 2015-06-23 DIAGNOSIS — Z961 Presence of intraocular lens: Secondary | ICD-10-CM | POA: Diagnosis not present

## 2015-06-23 DIAGNOSIS — R262 Difficulty in walking, not elsewhere classified: Secondary | ICD-10-CM | POA: Diagnosis not present

## 2015-06-23 DIAGNOSIS — R2689 Other abnormalities of gait and mobility: Secondary | ICD-10-CM | POA: Diagnosis not present

## 2015-06-23 DIAGNOSIS — H35363 Drusen (degenerative) of macula, bilateral: Secondary | ICD-10-CM | POA: Diagnosis not present

## 2015-06-23 DIAGNOSIS — H5203 Hypermetropia, bilateral: Secondary | ICD-10-CM | POA: Diagnosis not present

## 2015-06-23 DIAGNOSIS — M545 Low back pain, unspecified: Secondary | ICD-10-CM

## 2015-06-23 DIAGNOSIS — H47093 Other disorders of optic nerve, not elsewhere classified, bilateral: Secondary | ICD-10-CM | POA: Diagnosis not present

## 2015-06-23 DIAGNOSIS — H02831 Dermatochalasis of right upper eyelid: Secondary | ICD-10-CM | POA: Diagnosis not present

## 2015-06-23 LAB — POCT INR: INR: 4.1

## 2015-06-23 NOTE — Progress Notes (Signed)
RN note reviewed. Agree with documention and plan. 

## 2015-06-23 NOTE — Patient Instructions (Signed)
Per Mackie Pai, PA-C: Take Coumadin 2.5 mg on Thursday, Saturday & Monday and all other days take Coumadin 5 mg. Return for INR recheck on next Thursday, 07/01/15.

## 2015-06-23 NOTE — Progress Notes (Addendum)
Pre visit review using our clinic review tool, if applicable. No additional management support is needed unless otherwise documented below in the visit note.  Patient presents today for INR check. Reading was 4.1. Patient did not report any positive findings or changes in medications. Also, she voiced being pretty consistent with her greens intake each week, however patient did mention that she does not eat many vegetables that are high in vitamin K. Re-iterated that consuming less greens than usual may increase INR, which in return makes the blood thinner, therefore it's important to be consistent with the number of servings on a daily basis.  Per Percell Miller Saguier: Take Coumadin 2.5 mg on Thursday, Saturday & Monday and all other days take Coumadin 5 mg. Return for INR recheck on next Thursday, 07/01/15. Informed patient of the provider's instructions. She verbalized understanding and did not have any questions or concerns prior to leaving the office. Next appointment scheduled for 07/01/15 at 9:30 AM.       Above is advised plan/dosage regimen of coumadin  that I gave after discussion with RN.  Saguier, Percell Miller, PA-C

## 2015-06-23 NOTE — Therapy (Signed)
[facility] [place] [address]  [place], [place], [address] Phone: [phone]   Fax:  [phone]  Physical Therapy Treatment  Patient Details  Name: Mikayla [name] MRN: [id number] Date of Birth: July 31, 1940 Referring Provider: Consuella Lose, MD  Encounter Date: 06/23/2015      PT End of Session - 06/23/15 1500    Visit Number 16   Number of Visits 20   Date for PT Re-Evaluation 06/30/15   PT Start Time 1500  pt late   PT Stop Time 1532   PT Time Calculation (min) 32 min      Past Medical History  Diagnosis Date  . Acute venous embolism and thrombosis of unspecified deep vessels of lower extremity     after broken foot screw left in place, left leg  . Aortic stenosis     echo 11/11: EF 60%, mild LVH, mod/severe AS with mean 32 mmHg, peak 55 mmHg, mild AI, mild MR, (previous echo 2010 with mean gradient 29 mmHg)  . Carotid stenosis     dopplers 11/11: 0-39% bilat  . Palpitations     PACs and atrial tachy  . Other congenital anomaly of toes   . Pain in joint, pelvic region and thigh   . Atrial tachycardia ([place])   . Dyslipidemia   . Anemia   . GERD (gastroesophageal reflux disease)   . Bilateral dry eyes   . Hyperlipidemia   . History of chicken pox   . H/O measles   . Abdominal pain 08/25/2014  . Peripheral edema 08/25/2014  . Dehydration 08/25/2014  . Hyperlipidemia, mild 10/08/2014  . Osteopenia 10/08/2014  . Brain aneurysm 01/16/2015    Past Surgical History  Procedure Laterality Date  . Tonsillectomy    . Radical hysterectomy    . Foot surgery    . Left hip replacement    . Hyroglossal duct cyst resection    . Left and right heart catheterization with coronary angiogram N/A 03/21/2013    Procedure: LEFT AND RIGHT HEART CATHETERIZATION WITH CORONARY ANGIOGRAM;  Surgeon: Minus Breeding, MD;  Location: [facility] CATH LAB;  Service: Cardiovascular;  Laterality: N/A;  . Cholecystectomy      There were no vitals  filed for this visit.      Subjective Assessment - 06/23/15 1501    Subjective States back has been feeling good lately stating pain limited to 1-2/10 lately.   Currently in Pain? Yes   Pain Score --  1-2/10   Pain Location Back   Pain Orientation Lower           TODAY'S TREATMENT TherEx - Foam Beam Side-Stepping with HHA 2 laps Foam Beam ALT mini lateral lunge 10x each with back of chair as assist device Standing Hip ABD Yellow TB at ankles 10x each with Single Pole A 8" step toe-tapping while standing on Foam Pad 10x each with light back of chair assist BOSU (Up) FW step and wt shift 6x each with B Pole A BOSU (Up) lateral lunges with B pole A 10x each Standing at counter in/ou of Trendelenburg for Hip ABD strengthening 15x each 1/2 Foam Roller side stepover with Yellow TB at ankles 12x each with hands on counter for assistance             PT Short Term Goals - 04/07/15 1504    PT SHORT TERM GOAL #1   Title pt independent with initial HEP by 04/05/15   Status Achieved  PT Long Term Goals - 06/02/15 1623    PT LONG TERM GOAL #1   Title pt reports decreased LBP to 2/10 or less on average by 06/30/15  much improved and AVG 2-3/10 lately.   Status Partially Met   PT LONG TERM GOAL #2   Title pt displays B Hip MMT 4/5 or better grossly by 06/30/15  this too has progressed quite well but still weak with B Hip ABD and EXT.   Status Partially Met   PT LONG TERM GOAL #3   Title pt able to stand and ambulate up to 60 minutes without limitation by LBP or LE weakness by 06/30/15  with use of shopping cart while shopping   Status Partially Met   PT LONG TERM GOAL #4   Title pt able to perform transfers (sit->stand and car transfers) with minimal to no need for UE use   Status Achieved               Plan - 06/23/15 1533    Clinical Impression Statement minimal to no pain lately; performed well with exercises today but time limited due to pt  arriving late (was stuck and MD office).  will assess next week for continue vs d/c.   PT Next Visit Plan Lumbopelvic stability training as tolerated; Hip strengthening as able; begin more gait / balance training as able   Consulted and Agree with Plan of Care Patient      Patient will benefit from skilled therapeutic intervention in order to improve the following deficits and impairments:  Pain, Decreased strength, Decreased mobility, Decreased balance, Decreased activity tolerance, Abnormal gait, Difficulty walking  Visit Diagnosis: Midline low back pain without sciatica  Difficulty in walking, not elsewhere classified     Problem List Patient Active Problem List   Diagnosis Date Noted  . Postconcussive syndrome 04/01/2015  . Neck pain on left side 04/01/2015  . Brain aneurysm 01/16/2015  . Hyperlipidemia, mild 10/08/2014  . Osteopenia 10/08/2014  . Abdominal pain 08/25/2014  . Peripheral edema 08/25/2014  . Dehydration 08/25/2014  . History of DVT (deep vein thrombosis) 08/14/2014  . Anemia   . GERD (gastroesophageal reflux disease)   . Bilateral dry eyes   . History of chicken pox   . Adiposity 07/28/2013  . Esophagitis 09/13/2010  . Shortness of breath 06/16/2010  . Chest pain, unspecified 06/16/2010  . Osteoarthrosis involving more than one site but not generalized 05/30/2010  . Aortic valve disorder 12/01/2008  . CAROTID STENOSIS 12/01/2008  . HIP PAIN 12/01/2008  . HAMMER TOE 11/27/2008  . Palpitations 11/27/2008  . H/O cataract extraction 03/15/2007  . Benign essential HTN 04/17/2006  . Buzzing in ear 04/12/2006  . History of artificial joint 03/17/2005  . Leg pain 10/17/2004  . Arthritis, degenerative 05/21/2002  . Chronic venous insufficiency 05/21/2002  . Bone/cartilage disorder 04/26/2001  . 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

## 2015-06-28 ENCOUNTER — Ambulatory Visit: Payer: Medicare Other | Admitting: Physical Therapy

## 2015-06-28 DIAGNOSIS — R2689 Other abnormalities of gait and mobility: Secondary | ICD-10-CM

## 2015-06-28 DIAGNOSIS — R262 Difficulty in walking, not elsewhere classified: Secondary | ICD-10-CM

## 2015-06-28 DIAGNOSIS — M545 Low back pain, unspecified: Secondary | ICD-10-CM

## 2015-06-28 DIAGNOSIS — H02834 Dermatochalasis of left upper eyelid: Secondary | ICD-10-CM | POA: Diagnosis not present

## 2015-06-28 DIAGNOSIS — H02831 Dermatochalasis of right upper eyelid: Secondary | ICD-10-CM | POA: Diagnosis not present

## 2015-06-28 NOTE — Therapy (Signed)
[facility] [place] [address]  [name], [place], [address] Phone: [phone]   Fax:  [phone]  Physical Therapy Treatment  Patient Details  Name: Mikayla [name] MRN: [id number] Date of Birth: 11-May-1940 Referring Provider: Consuella Lose, MD  Encounter Date: 06/28/2015      PT End of Session - 06/28/15 1405    Visit Number 17   Number of Visits 20   Date for PT Re-Evaluation 06/30/15   PT Start Time 1401   PT Stop Time 1448   PT Time Calculation (min) 47 min      Past Medical History  Diagnosis Date  . Acute venous embolism and thrombosis of unspecified deep vessels of lower extremity     after broken foot screw left in place, left leg  . Aortic stenosis     echo 11/11: EF 60%, mild LVH, mod/severe AS with mean 32 mmHg, peak 55 mmHg, mild AI, mild MR, (previous echo 2010 with mean gradient 29 mmHg)  . Carotid stenosis     dopplers 11/11: 0-39% bilat  . Palpitations     PACs and atrial tachy  . Other congenital anomaly of toes   . Pain in joint, pelvic region and thigh   . Atrial tachycardia ([place])   . Dyslipidemia   . Anemia   . GERD (gastroesophageal reflux disease)   . Bilateral dry eyes   . Hyperlipidemia   . History of chicken pox   . H/O measles   . Abdominal pain 08/25/2014  . Peripheral edema 08/25/2014  . Dehydration 08/25/2014  . Hyperlipidemia, mild 10/08/2014  . Osteopenia 10/08/2014  . Brain aneurysm 01/16/2015    Past Surgical History  Procedure Laterality Date  . Tonsillectomy    . Radical hysterectomy    . Foot surgery    . Left hip replacement    . Hyroglossal duct cyst resection    . Left and right heart catheterization with coronary angiogram N/A 03/21/2013    Procedure: LEFT AND RIGHT HEART CATHETERIZATION WITH CORONARY ANGIOGRAM;  Surgeon: Mikayla Breeding, MD;  Location: [facility] CATH LAB;  Service: Cardiovascular;  Laterality: N/A;  . Cholecystectomy      There were no vitals filed for  this visit.      Subjective Assessment - 06/28/15 1406    Subjective States is pain-free today and states "this has really helped by back as far as pain is concerned" referring to PT.  She states she noted bout of pain over the weekend after bending over to pick up something. States shoulder "is okay" but states hasn't been performing HEP for this.   Currently in Pain? No/denies           TODAY'S TREATMENT TherEx - NuStep lvl 4, 3' TRX SLS several bouts each leg 2-10" TRX in/out of Trendelenburg for hip ABD strengthening 15x each TRX Foam Pad March in place 15x each SBA 2, 6" Foam Roller side stepover 10 laps Standing Hip ABD Green TB at ankle, SPC assist, 12x each Standing Hip Ext Green TB at ankle, SPC Assist, 12x each One Arm Low Row Machine 10#, 10x each (core stability training) Seated Fitter SL Leg Press 1 Black + 1 Blue 15x Right, 6x Left (stopped due to Left knee pain) Hooklying B Hip ABD (feet together for increased ER) Green TB 15x2" Hooklying March with TrA 10x each Bridge [place] 12x2"            PT Short Term Goals -  04/07/15 1504    PT SHORT TERM GOAL #1   Title pt independent with initial HEP by 04/05/15   Status Achieved           PT Long Term Goals - 06/02/15 1623    PT LONG TERM GOAL #1   Title pt reports decreased LBP to 2/10 or less on average by 06/30/15  much improved and AVG 2-3/10 lately.   Status Partially Met   PT LONG TERM GOAL #2   Title pt displays B Hip MMT 4/5 or better grossly by 06/30/15  this too has progressed quite well but still weak with B Hip ABD and EXT.   Status Partially Met   PT LONG TERM GOAL #3   Title pt able to stand and ambulate up to 60 minutes without limitation by LBP or LE weakness by 06/30/15  with use of shopping cart while shopping   Status Partially Met   PT LONG TERM GOAL #4   Title pt able to perform transfers (sit->stand and car transfers) with minimal to no need for UE use   Status Achieved                Plan - 06/28/15 1446    Clinical Impression Statement discussed with pt that the next visit will likely be her last OPPT visit on this POC and then will discharge.  She is very pleased with back pain progress and next visit will be her 18th visit and I'm concerned she may reach her Medicare cap.   PT Next Visit Plan re-assess for likely discharge   Consulted and Agree with Plan of Care Patient      Patient will benefit from skilled therapeutic intervention in order to improve the following deficits and impairments:  Pain, Decreased strength, Decreased mobility, Decreased balance, Decreased activity tolerance, Abnormal gait, Difficulty walking  Visit Diagnosis: Midline low back pain without sciatica  Difficulty in walking, not elsewhere classified  Other abnormalities of gait and mobility     Problem List Patient Active Problem List   Diagnosis Date Noted  . Postconcussive syndrome 04/01/2015  . Neck pain on left side 04/01/2015  . Brain aneurysm 01/16/2015  . Hyperlipidemia, mild 10/08/2014  . Osteopenia 10/08/2014  . Abdominal pain 08/25/2014  . Peripheral edema 08/25/2014  . Dehydration 08/25/2014  . History of DVT (deep vein thrombosis) 08/14/2014  . Anemia   . GERD (gastroesophageal reflux disease)   . Bilateral dry eyes   . History of chicken pox   . Adiposity 07/28/2013  . Esophagitis 09/13/2010  . Shortness of breath 06/16/2010  . Chest pain, unspecified 06/16/2010  . Osteoarthrosis involving more than one site but not generalized 05/30/2010  . Aortic valve disorder 12/01/2008  . CAROTID STENOSIS 12/01/2008  . HIP PAIN 12/01/2008  . HAMMER TOE 11/27/2008  . Palpitations 11/27/2008  . H/O cataract extraction 03/15/2007  . Benign essential HTN 04/17/2006  . Buzzing in ear 04/12/2006  . History of artificial joint 03/17/2005  . Leg pain 10/17/2004  . Arthritis, degenerative 05/21/2002  . Chronic venous insufficiency 05/21/2002  .  Bone/cartilage disorder 04/26/2001  . 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 [address]  [place], [place], [address] Phone: [phone]   Fax:  [phone]  Name: Mikayla Jobst MRN: [id number] Date of Birth: 08/18/1940

## 2015-06-30 ENCOUNTER — Ambulatory Visit: Payer: Medicare Other | Admitting: Physical Therapy

## 2015-06-30 DIAGNOSIS — M545 Low back pain, unspecified: Secondary | ICD-10-CM

## 2015-06-30 DIAGNOSIS — R2689 Other abnormalities of gait and mobility: Secondary | ICD-10-CM

## 2015-06-30 DIAGNOSIS — R262 Difficulty in walking, not elsewhere classified: Secondary | ICD-10-CM

## 2015-06-30 NOTE — Therapy (Signed)
[facility] [address]  [place], [place], [address] Phone: [phone]   Fax:  [phone]  Physical Therapy Treatment  Patient Details  Name: Mikayla [name] MRN: [id number] Date of Birth: 1941/01/04 Referring Provider: Consuella Lose, MD  Encounter Date: 06/30/2015      PT End of Session - 06/30/15 1407    Visit Number 18   Number of Visits 20   Date for PT Re-Evaluation 06/30/15   PT Start Time 0086   PT Stop Time 1446   PT Time Calculation (min) 39 min      Past Medical History  Diagnosis Date  . Acute venous embolism and thrombosis of unspecified deep vessels of lower extremity     after broken foot screw left in place, left leg  . Aortic stenosis     echo 11/11: EF 60%, mild LVH, mod/severe AS with mean 32 mmHg, peak 55 mmHg, mild AI, mild MR, (previous echo 2010 with mean gradient 29 mmHg)  . Carotid stenosis     dopplers 11/11: 0-39% bilat  . Palpitations     PACs and atrial tachy  . Other congenital anomaly of toes   . Pain in joint, pelvic region and thigh   . Atrial tachycardia ([place])   . Dyslipidemia   . Anemia   . GERD (gastroesophageal reflux disease)   . Bilateral dry eyes   . Hyperlipidemia   . History of chicken pox   . H/O measles   . Abdominal pain 08/25/2014  . Peripheral edema 08/25/2014  . Dehydration 08/25/2014  . Hyperlipidemia, mild 10/08/2014  . Osteopenia 10/08/2014  . Brain aneurysm 01/16/2015    Past Surgical History  Procedure Laterality Date  . Tonsillectomy    . Radical hysterectomy    . Foot surgery    . Left hip replacement    . Hyroglossal duct cyst resection    . Left and right heart catheterization with coronary angiogram N/A 03/21/2013    Procedure: LEFT AND RIGHT HEART CATHETERIZATION WITH CORONARY ANGIOGRAM;  Surgeon: Minus Breeding, MD;  Location: [facility] CATH LAB;  Service: Cardiovascular;  Laterality: N/A;  . Cholecystectomy      There were no vitals filed for  this visit.      Subjective Assessment - 06/30/15 1411    Subjective She states her lower back has been feeling much better "I am amazed how little it hurts". States feels sore sometimes and does note pain if on fee "too long" but rates AVG pain 1/10 over the past week.   Currently in Pain? Yes   Pain Score 1    Pain Location Back   Pain Orientation Lower            [facility] PT Assessment - 06/30/15 0001    Strength   Right Hip Flexion 4+/5   Right Hip Extension --  3/5 to 3+/5 (assessed with Bridge)   Right Hip External Rotation  4/5   Right Hip Internal Rotation 4+/5   Right Hip ABduction 3+/5   Left Hip Flexion 4+/5   Left Hip Extension --  3/5 to 3+/5 (assessed with Bridge)   Left Hip External Rotation 4-/5   Left Hip Internal Rotation 4+/5   Left Hip ABduction 3+/5   Right Knee Flexion 5/5   Right Knee Extension 5/5   Left Knee Flexion 4+/5   Left Knee Extension 4+/5       TODAY'S TREATMENT TherEx - NuStep lvl 4, 4' Bridge  10x SLR 6x R, 2x L (too difficult and mild pain on L so stopped) Seated March with Red TB at knees 10x Standing Hip Extension with Yellow TB at ankles 10x each Standing Hip ABD with Yellow TB at ankles 10x each  Reviewed shoulder and updated LE HEP and issued Red TB for updated HEP.             PT Education - 19-Jul-2015 1459    Education provided Yes   Education Details Discharge HEP   Person(s) Educated Patient   Methods Explanation;Demonstration;Handout   Comprehension Verbalized understanding;Returned demonstration          PT Short Term Goals - 04/07/15 1504    PT SHORT TERM GOAL #1   Title pt independent with initial HEP by 04/05/15   Status Achieved           PT Long Term Goals - 19-Jul-2015 1506    PT LONG TERM GOAL #1   Title pt reports decreased LBP to 2/10 or less on average   Status Achieved   PT LONG TERM GOAL #2   Title pt displays B Hip MMT 4/5 or better grossly by 07-19-2015  Met other than Hip ABD and Ext    Status Partially Met   PT LONG TERM GOAL #3   Title pt able to stand and ambulate up to 60 minutes without limitation by LBP or LE weakness by Jul 19, 2015  met with use of walking aide ([place] vs shopping cart)   Status Partially Met   PT LONG TERM GOAL #4   Title pt able to perform transfers (sit->stand and car transfers) with minimal to no need for UE use   Status Achieved               Plan - 07-19-2015 1500    Clinical Impression Statement Mikayla [name] is being discharged from [facility] today.  Whereas Mikayla [name] did not meet all her strength goals she did progress very well since beginning PT.  She states "I'm amazed how little it hurts" speaking of her lower back.  She states her pain has been 1/10 on AVG lately and is able to stand/walk well enough to go grocery shopping.  She amulates with use of SPC for most community ambulation, SPC is not required with household ambulation.  B Hip Strength has improved but still quite weak into Extension and ABD.  Updated HEP issued today to allow her to continue to progress independently.  She has attended 4 PT treatments and is being discharged due to end of authorization period and goals are nearly met. I am hopeful she will continue to progress with use of HEP.   Consulted and Agree with Plan of Care Patient      Visit Diagnosis: Midline low back pain without sciatica  Difficulty in walking, not elsewhere classified  Other abnormalities of gait and mobility       G-Codes - 07-19-2015 1509    Functional Assessment Tool Used FOTO 65% limitation   Functional Limitation Mobility: Walking and moving around   Mobility: Walking and Moving Around Current Status [phone]) At least 60 percent but less than 80 percent impaired, limited or restricted   Mobility: Walking and Moving Around Goal Status [phone]) At least 40 percent but less than 60 percent impaired, limited or restricted   Mobility: Walking and Moving Around Discharge Status [phone]) At least 60  percent but less than 80 percent impaired, limited or restricted      Problem  List Patient Active Problem List   Diagnosis Date Noted  . Postconcussive syndrome 04/01/2015  . Neck pain on left side 04/01/2015  . Brain aneurysm 01/16/2015  . Hyperlipidemia, mild 10/08/2014  . Osteopenia 10/08/2014  . Abdominal pain 08/25/2014  . Peripheral edema 08/25/2014  . Dehydration 08/25/2014  . History of DVT (deep vein thrombosis) 08/14/2014  . Anemia   . GERD (gastroesophageal reflux disease)   . Bilateral dry eyes   . History of chicken pox   . Adiposity 07/28/2013  . Esophagitis 09/13/2010  . Shortness of breath 06/16/2010  . Chest pain, unspecified 06/16/2010  . Osteoarthrosis involving more than one site but not generalized 05/30/2010  . Aortic valve disorder 12/01/2008  . CAROTID STENOSIS 12/01/2008  . HIP PAIN 12/01/2008  . HAMMER TOE 11/27/2008  . Palpitations 11/27/2008  . H/O cataract extraction 03/15/2007  . Benign essential HTN 04/17/2006  . Buzzing in ear 04/12/2006  . History of artificial joint 03/17/2005  . Leg pain 10/17/2004  . Arthritis, degenerative 05/21/2002  . Chronic venous insufficiency 05/21/2002  . Bone/cartilage disorder 04/26/2001  . 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  [place], [place], [address] Phone: [phone]   Fax:  [phone]  Name: Mikayla [name] MRN: [id number] Date of Birth: 1940/05/27    PHYSICAL THERAPY DISCHARGE SUMMARY  Visits from Start of Care: 18  Current functional level related to goals / functional outcomes: Much improved trunk strength and level of function.  Is able to stand/walk without limitation by pain which allows her to go grocery shopping without having to use electric cart. Weakness still present in Hips but pt is independent with HEP and I am hopeful this will  continue to improve.   Remaining deficits: B Hip weakness, intermittent mild LBP   Education / Equipment: HEP Plan: Patient agrees to discharge.  Patient goals were partially met. Patient is being discharged due to being pleased with the current functional level.  ?????        Leonette Most, PT, OCS 06/30/2015 3:19 PM

## 2015-07-01 ENCOUNTER — Other Ambulatory Visit: Payer: Self-pay | Admitting: Family Medicine

## 2015-07-01 ENCOUNTER — Ambulatory Visit (INDEPENDENT_AMBULATORY_CARE_PROVIDER_SITE_OTHER): Payer: Medicare Other | Admitting: *Deleted

## 2015-07-01 DIAGNOSIS — Z86718 Personal history of other venous thrombosis and embolism: Secondary | ICD-10-CM | POA: Diagnosis not present

## 2015-07-01 LAB — POCT INR: INR: 2.2

## 2015-07-01 MED ORDER — DESIPRAMINE HCL 10 MG PO TABS: ORAL_TABLET | ORAL | Status: DC

## 2015-07-01 NOTE — Progress Notes (Signed)
RN Coumadin note reviewed. Agree with documention and plan.

## 2015-07-01 NOTE — Patient Instructions (Signed)
Per Dr. Charlett Blake: Take coumadin 2.5 mg on Tuesday and Saturday. Take 5 mg all other days. Return for INR check in 2 weeks.

## 2015-07-01 NOTE — Telephone Encounter (Signed)
Ok to fill do not see where you previously filled.

## 2015-07-01 NOTE — Progress Notes (Signed)
Pre visit review using our clinic review tool, if applicable. No additional management support is needed unless otherwise documented below in the visit note.  INR today 2.2. Pt notes some bruising on bilateral forearms from the weight of her purse strap.   Per Dr. Charlett Blake: Take coumadin 2.5 mg on Tuesday and Saturday. Take 5 mg all other days. Return for INR check in 2 weeks.  Next appt: 07/14/15  Dorrene German, RN

## 2015-07-07 ENCOUNTER — Ambulatory Visit: Payer: Medicare Other

## 2015-07-13 DIAGNOSIS — M542 Cervicalgia: Secondary | ICD-10-CM | POA: Diagnosis not present

## 2015-07-13 DIAGNOSIS — G44309 Post-traumatic headache, unspecified, not intractable: Secondary | ICD-10-CM | POA: Diagnosis not present

## 2015-07-14 ENCOUNTER — Ambulatory Visit (INDEPENDENT_AMBULATORY_CARE_PROVIDER_SITE_OTHER): Payer: Medicare Other | Admitting: Behavioral Health

## 2015-07-14 DIAGNOSIS — Z86718 Personal history of other venous thrombosis and embolism: Secondary | ICD-10-CM

## 2015-07-14 LAB — POCT INR: INR: 2.1

## 2015-07-14 NOTE — Patient Instructions (Signed)
Per Elyn Aquas, PA-C: Continue taking coumadin 2.5 mg on Tuesday and Saturday. Take 5 mg all other days. Return for INR check in 4 weeks.

## 2015-07-14 NOTE — Progress Notes (Signed)
Pre visit review using our clinic review tool, if applicable. No additional management support is needed unless otherwise documented below in the visit note.  Patient in office for INR check. Today's reading was 2.1. She did not report any positive findings or changes with medications/diet. Per Elyn Aquas, PA-C: Continue taking coumadin 2.5 mg on Tuesday and Saturday. Take 5 mg all other days. Return for INR check in 4 weeks.  Informed patient of the provider's instructions. She verbalized understanding and did not have any questions or concerns prior to leaving the nurse visit.  Next appointment scheduled for 08/11/15 at 2:15 PM.

## 2015-08-03 ENCOUNTER — Other Ambulatory Visit: Payer: Self-pay | Admitting: Family Medicine
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
## 2015-08-04 ENCOUNTER — Ambulatory Visit (INDEPENDENT_AMBULATORY_CARE_PROVIDER_SITE_OTHER): Payer: Medicare Other | Admitting: Cardiology

## 2015-08-04 ENCOUNTER — Other Ambulatory Visit: Payer: Self-pay

## 2015-08-04 ENCOUNTER — Encounter: Payer: Self-pay | Admitting: Cardiology

## 2015-08-04 VITALS — BP 118/60 | HR 65 | Ht 65.0 in | Wt 173.0 lb

## 2015-08-04 DIAGNOSIS — I10 Essential (primary) hypertension: Secondary | ICD-10-CM

## 2015-08-04 DIAGNOSIS — I480 Paroxysmal atrial fibrillation: Secondary | ICD-10-CM

## 2015-08-04 MED ORDER — GABAPENTIN 800 MG PO TABS: 800.0000 mg | ORAL_TABLET | Freq: Three times a day (TID) | ORAL | Status: DC

## 2015-08-04 NOTE — Patient Instructions (Signed)
Medication Instructions:  The current medical regimen is effective;  continue present plan and medications.  Follow-Up: Follow up in 1 year with Dr. Hochrein in [place].  You will receive a letter in the mail 2 months before you are due.  Please call us when you receive this letter to schedule your follow up appointment.  If you need a refill on your cardiac medications before your next appointment, please call your pharmacy.  Thank you for choosing [facility]!!     

## 2015-08-11 ENCOUNTER — Ambulatory Visit: Payer: Medicare Other

## 2015-08-13 ENCOUNTER — Ambulatory Visit (INDEPENDENT_AMBULATORY_CARE_PROVIDER_SITE_OTHER): Payer: Medicare Other | Admitting: Behavioral Health

## 2015-08-13 DIAGNOSIS — Z86718 Personal history of other venous thrombosis and embolism: Secondary | ICD-10-CM | POA: Diagnosis not present

## 2015-08-13 LAB — POCT INR: INR: 2.7

## 2015-08-13 NOTE — Patient Instructions (Signed)
Continue taking Coumadin 2.5 mg on Tuesday and Saturday & take Coumadin 5 mg all other days. Return for INR check in 4 weeks.

## 2015-08-13 NOTE — Progress Notes (Signed)
Pre visit review using our clinic review tool, if applicable. No additional management support is needed unless otherwise documented below in the visit note.  Patient presents in office for INR check. Today's reading was 2.7. She did not report any positive findings or changes with medications and diet.   Informed patient to continue taking Coumadin 2.5 mg on Tuesday and Saturday & take Coumadin 5 mg all other days. Return for INR check in 4 weeks.  Patient verbalized understanding and did not have any questions or concerns before leaving the nurse visit.  Next appointment scheduled for 09/10/15 at 3:30 PM.

## 2015-08-26 DIAGNOSIS — H02831 Dermatochalasis of right upper eyelid: Secondary | ICD-10-CM | POA: Diagnosis not present

## 2015-08-26 DIAGNOSIS — H02834 Dermatochalasis of left upper eyelid: Secondary | ICD-10-CM | POA: Diagnosis not present

## 2015-09-10 ENCOUNTER — Ambulatory Visit (INDEPENDENT_AMBULATORY_CARE_PROVIDER_SITE_OTHER): Payer: Medicare Other | Admitting: *Deleted

## 2015-09-10 DIAGNOSIS — Z86718 Personal history of other venous thrombosis and embolism: Secondary | ICD-10-CM

## 2015-09-10 LAB — POCT INR: INR: 2.1

## 2015-09-10 NOTE — Progress Notes (Signed)
Pre visit review using our clinic review tool, if applicable. No additional management support is needed unless otherwise documented below in the visit note.  INR 2.1. Pt findings negative.   Continue taking Coumadin 2.5 mg on Tuesday and Saturday & take Coumadin 5 mg all other days. Return for INR check in 4 weeks.  Next appointment: 10/12/15  Dorrene German, RN

## 2015-09-10 NOTE — Patient Instructions (Signed)
Continue taking Coumadin 2.5 mg on Tuesday and Saturday & take Coumadin 5 mg all other days. Return for INR check in 4 weeks.

## 2015-09-17 ENCOUNTER — Other Ambulatory Visit: Payer: Self-pay | Admitting: Family Medicine

## 2015-10-12 ENCOUNTER — Ambulatory Visit (INDEPENDENT_AMBULATORY_CARE_PROVIDER_SITE_OTHER): Payer: Medicare Other | Admitting: *Deleted

## 2015-10-12 ENCOUNTER — Ambulatory Visit: Payer: Medicare Other

## 2015-10-12 DIAGNOSIS — Z86718 Personal history of other venous thrombosis and embolism: Secondary | ICD-10-CM | POA: Diagnosis not present

## 2015-10-12 LAB — POCT INR: INR: 2.3

## 2015-10-12 MED ORDER — WARFARIN SODIUM 5 MG PO TABS: ORAL_TABLET | ORAL | 5 refills | Status: DC

## 2015-10-12 NOTE — Progress Notes (Signed)
Pre visit review using our clinic review tool, if applicable. No additional management support is needed unless otherwise documented below in the visit note.  Today's INR: 2.3 Patient findings negative.  Continue taking Coumadin 2.5 mg on Tuesday and Saturday & take Coumadin 5 mg all other days. Return for INR check in 4 weeks.   Next appointment:  11/09/15  Gerilyn Nestle, RN

## 2015-11-03 DIAGNOSIS — M542 Cervicalgia: Secondary | ICD-10-CM | POA: Diagnosis not present

## 2015-11-03 DIAGNOSIS — G44309 Post-traumatic headache, unspecified, not intractable: Secondary | ICD-10-CM | POA: Diagnosis not present

## 2015-11-09 ENCOUNTER — Ambulatory Visit (INDEPENDENT_AMBULATORY_CARE_PROVIDER_SITE_OTHER): Payer: Medicare Other | Admitting: *Deleted

## 2015-11-09 DIAGNOSIS — Z86718 Personal history of other venous thrombosis and embolism: Secondary | ICD-10-CM

## 2015-11-09 LAB — POCT INR: INR: 2

## 2015-11-09 NOTE — Patient Instructions (Signed)
Per Elyn Aquas, PA-C: Continue taking Coumadin 2.5 mg on Tuesday and Saturday & take Coumadin 5 mg all other days. Return for INR check on 12/02/15 at appt w/ Dr. Charlett Blake.

## 2015-11-09 NOTE — Progress Notes (Signed)
Reviewed RN note. Plan is as stated.  Sho Salguero Cody, PA-C  

## 2015-11-09 NOTE — Progress Notes (Signed)
Pre visit review using our clinic review tool, if applicable. No additional management support is needed unless otherwise documented below in the visit note.  Per Elyn Aquas, PA-C: Continue taking Coumadin 2.5 mg on Tuesday and Saturday & take Coumadin 5 mg all other days. Return for INR check on 12/02/15 at appt w/ Dr. Charlett Blake.  Next appointment: 12/02/15  Dorrene German, RN

## 2015-12-02 ENCOUNTER — Ambulatory Visit (INDEPENDENT_AMBULATORY_CARE_PROVIDER_SITE_OTHER): Payer: Medicare Other | Admitting: Family Medicine

## 2015-12-02 ENCOUNTER — Encounter: Payer: Self-pay | Admitting: Family Medicine

## 2015-12-02 VITALS — BP 120/68 | HR 62 | Resp 16 | Ht 65.0 in | Wt 177.6 lb

## 2015-12-02 DIAGNOSIS — Z86718 Personal history of other venous thrombosis and embolism: Secondary | ICD-10-CM | POA: Diagnosis not present

## 2015-12-02 DIAGNOSIS — M8580 Other specified disorders of bone density and structure, unspecified site: Secondary | ICD-10-CM | POA: Diagnosis not present

## 2015-12-02 DIAGNOSIS — I10 Essential (primary) hypertension: Secondary | ICD-10-CM

## 2015-12-02 DIAGNOSIS — Z0000 Encounter for general adult medical examination without abnormal findings: Secondary | ICD-10-CM

## 2015-12-02 DIAGNOSIS — Z23 Encounter for immunization: Secondary | ICD-10-CM

## 2015-12-02 LAB — POCT INR: INR: 1.9

## 2015-12-02 NOTE — Progress Notes (Signed)
Subjective:   Mikayla [name] is a 75 y.o. female who presents for an Initial Medicare Annual Wellness Visit.  Review of Systems    No ROS.  Medicare Wellness Visit.  Cardiac Risk Factors include: advanced age (>54men, >9 women);hypertension;sedentary lifestyle  Sleep patterns: No sleep issues. Minimum of 6 hrs nightly. Gets up 1-2x nightly to void.     Home Safety/Smoke Alarms: No stairs in home. Lives w/ daughter. Smoke detector in home. Feels safe in home. Living environment; residence and Firearm Safety: Stored in a safe place. Seat Belt Safety/Bike Helmet: Wears seat belt.     Counseling:   Eye Exam- Dr. Laveda Abbe yearly and PRN. Wearing glasses. No issues reported. Dental- Dentures upper and lower. Does not see dentist regularly. No dental issues reported.  Female:   Pap- Hysterectomy      Mammo- Not on file. No routine screening due to age.        Dexa scan- 11/11/14 [place]       CCS- Not on file. Last in [place], [place]. Pt was told not have further screening d/t 'kink' in colon and risk of perforation w/ procedure.     Objective:    Today's Vitals   12/02/15 1146  BP: 120/68  Pulse: 62  Resp: 16  SpO2: 98%  Weight: 177 lb 9.6 oz (80.6 kg)  Height: 5\' 5"  (1.651 m)   Body mass index is 29.55 kg/m.   Current Medications (verified) Outpatient Encounter Prescriptions as of 12/02/2015  Medication Sig  . acetaminophen (TYLENOL) 500 MG tablet Take 1 tablet by mouth every 8 (eight) hours as needed for mild pain or headache.   . calcium citrate (CALCITRATE - DOSED IN MG ELEMENTAL CALCIUM) 950 MG tablet Take 200 mg of elemental calcium by mouth daily.  Marland Kitchen CARTIA XT 240 MG 24 hr capsule TAKE ONE CAPSULE BY MOUTH ONCE DAILY  . furosemide (LASIX) 20 MG tablet Take 1 tablet (20 mg total) by mouth daily as needed for edema.  . gabapentin (NEURONTIN) 800 MG tablet Take 1 tablet (800 mg total) by mouth 3 (three) times daily. (Patient taking differently: Take 800 mg by mouth 2  (two) times daily. )  . hyoscyamine (LEVSIN SL) 0.125 MG SL tablet Place 1 tablet (0.125 mg total) under the tongue every 4 (four) hours as needed.  . metoprolol tartrate (LOPRESSOR) 25 MG tablet Take 1 tablet (25 mg total) by mouth 2 (two) times daily.  . Multiple Vitamin (MULTIVITAMIN) tablet Take 1 tablet by mouth daily.    . Omega 3 1000 MG CAPS Take 1,000 mg by mouth daily. Reported on 03/24/2015  . omeprazole (PRILOSEC) 40 MG capsule TAKE ONE CAPSULE BY MOUTH ONCE DAILY (Patient taking differently: TAKE ONE CAPSULE BY MOUTH ONCE EVERY OTHER DAILY)  . Polyethyl Glycol-Propyl Glycol (SYSTANE OP) Apply 1 drop to eye 2 (two) times daily as needed. For dry eyes  . Probiotic Product (PHILLIPS COLON HEALTH PO) Take 1 capsule by mouth daily.  . traMADol (ULTRAM) 50 MG tablet Take 1 tablet (50 mg total) by mouth every 8 (eight) hours as needed.  . warfarin (COUMADIN) 5 MG tablet Take daily as directed by coumadin clinic.  Marland Kitchen desipramine (NOPRAMIN) 10 MG tablet Take 3 tablets by mouth daily (Patient not taking: Reported on 12/02/2015)  . [DISCONTINUED] methocarbamol (ROBAXIN) 500 MG tablet Take 500 mg by mouth daily.    No facility-administered encounter medications on file as of 12/02/2015.     Allergies (verified) Codeine and Indomethacin  History: Past Medical History:  Diagnosis Date  . Abdominal pain 08/25/2014  . Acute venous embolism and thrombosis of unspecified deep vessels of lower extremity    after broken foot screw left in place, left leg  . Anemia   . Aortic stenosis    echo 11/11: EF 60%, mild LVH, mod/severe AS with mean 32 mmHg, peak 55 mmHg, mild AI, mild MR, (previous echo 2010 with mean gradient 29 mmHg)  . Atrial tachycardia ([place])   . Bilateral dry eyes   . Brain aneurysm 01/16/2015  . Carotid stenosis    dopplers 11/11: 0-39% bilat  . Dehydration 08/25/2014  . Dyslipidemia   . GERD (gastroesophageal reflux disease)   . H/O measles   . History of chicken pox   .  Hyperlipidemia   . Hyperlipidemia, mild 10/08/2014  . Osteopenia 10/08/2014  . Other congenital anomaly of toes   . Pain in joint, pelvic region and thigh   . Palpitations    PACs and atrial tachy  . Peripheral edema 08/25/2014   Past Surgical History:  Procedure Laterality Date  . CHOLECYSTECTOMY    . FOOT SURGERY    . hyroglossal duct cyst resection    . LEFT AND RIGHT HEART CATHETERIZATION WITH CORONARY ANGIOGRAM N/A 03/21/2013   Procedure: LEFT AND RIGHT HEART CATHETERIZATION WITH CORONARY ANGIOGRAM;  Surgeon: Minus Breeding, MD;  Location: [facility] CATH LAB;  Service: Cardiovascular;  Laterality: N/A;  . left hip replacement    . RADICAL HYSTERECTOMY    . TONSILLECTOMY     Family History  Problem Relation Age of Onset  . Heart disease Mother   . Hypertension Mother 34  . Heart disease Father 44  . Cancer Maternal Aunt     Breast  . Heart disease Maternal Grandmother   . Heart disease Maternal Grandfather   . Hernia Daughter   . Gallstones Daughter    Social History   Occupational History  . Not on file.   Social History Main Topics  . Smoking status: Never Smoker  . Smokeless tobacco: Not on file  . Alcohol use No  . Drug use: No  . Sexual activity: No     Comment: moving in with daughter, retired from [employer] given: Not Answered   Activities of Daily Living In your present state of health, do you have any difficulty performing the following activities: 12/02/2015 01/15/2015  Hearing? N N  Vision? N N  Difficulty concentrating or making decisions? N N  Walking or climbing stairs? Y Y  Dressing or bathing? N N  Doing errands, shopping? N -  Preparing Food and eating ? N -  Using the Toilet? N -  In the past six months, have you accidently leaked urine? N -  Do you have problems with loss of bowel control? N -  Managing your Medications? N -  Managing your Finances? N -  Housekeeping or managing your  Housekeeping? N -  Some recent data might be hidden    Immunizations and Health Maintenance Immunization History  Administered Date(s) Administered  . Influenza, High Dose Seasonal PF 10/08/2014, 12/02/2015  . Pneumococcal Conjugate-13 11/27/2013  . Pneumococcal Polysaccharide-23 12/02/2015  . Tdap 12/07/2011  . Zoster 03/18/2008   Health Maintenance Due  Topic Date Due  . INFLUENZA VACCINE  09/07/2015    Patient Care Team: Mosie Lukes, MD as PCP - General (Family Medicine) Minus Breeding, MD as Consulting Physician (Cardiology) Herbie Baltimore  Acquanetta Belling, MD as Referring Physician (Ophthalmology) Consuella Lose, MD as Consulting Physician (Neurosurgery) Johny Shock. Doy Hutching, MD as Consulting Physician (Neurology)  Indicate any recent Medical Services you may have received from other than [facility] providers in the past year (date may be approximate).     Assessment:   This is a routine wellness examination for Mikayla [name]. Physical assessment deferred to PCP.  Hearing/Vision screen  Hearing Screening   125Hz  250Hz  500Hz  1000Hz  2000Hz  3000Hz  4000Hz  6000Hz  8000Hz   Right ear:   Fail Fail Pass  Fail    Left ear:   Fail Fail Pass  Fail    Comments: Able to hear conversational tones w/o difficulty. Reports having to turn TV up louder than she used to. Passes whisper test. Declines audiology referral at this time.   Dietary issues and exercise activities discussed: Current Exercise Habits: The patient does not participate in regular exercise at present;Home exercise routine, Type of exercise: Other - see comments (Back exercises from PT), Frequency (Times/Week): 7, Intensity: Mild   Diet (meal preparation, eat out, water intake, caffeinated beverages, dairy products, fruits and vegetables): in general, an "unhealthy" diet, on average, 3 meals per day. [employer]. Able to prepare meals, but prefers not cook. Eats out 2-3x weekly. Drinks coffee, bottled green tea, and water.   24 Hour  Recall: Breakfast: [place] Clap Croissant Lunch: Vegetable beef soup Dinner: Lelan Pons Calendar's pot pie     Goals    . Minimize use of prescription drugs as much as possible.      Depression Screen PHQ 2/9 Scores 12/02/2015 07/07/2014  PHQ - 2 Score 0 0    Fall Risk Fall Risk  12/02/2015 07/07/2014  Falls in the past year? No No    Cognitive Function: MMSE - Mini Mental State Exam 12/02/2015  Orientation to time 5  Orientation to Place 5  Registration 3  Attention/ Calculation 5  Recall 3  Language- name 2 objects 2  Language- repeat 1  Language- follow 3 step command 3  Language- read & follow direction 1  Write a sentence 1  Copy design 1  Total score 30        Screening Tests Health Maintenance  Topic Date Due  . INFLUENZA VACCINE  09/07/2015  . TETANUS/TDAP  12/06/2021  . DEXA SCAN  Completed  . ZOSTAVAX  Completed  . PNA vac Low Risk Adult  Addressed      Plan:    Bring a copy of your advanced directives to your next office visit.  Follow-up w/ Dr. [name] as scheduled.   Increase physical activity as tolerated.   Continue to eat heart healthy diet (full of fruits, vegetables, whole grains, lean protein, water--limit salt, fat, and sugar intake) and increase physical activity as tolerated.  Flu and PPSV-23 today.  During the course of the visit, Mikayla [name] was educated and counseled about the following appropriate screening and preventive services:   Vaccines to include Pneumoccal, Influenza, Hepatitis B, Td, Zostavax, HCV  Cardiovascular disease screening  Colorectal cancer screening  Bone density screening  Diabetes screening  Glaucoma screening  Mammography/PAP  Nutrition counseling   Patient Instructions (the written plan) were given to the patient.    Dorrene German, RN   12/02/2015    RN  AWV note reviewed. Agree with documention and plan.

## 2015-12-02 NOTE — Progress Notes (Signed)
Pre visit review using our clinic review tool, if applicable. No additional management support is needed unless otherwise documented below in the visit note. 

## 2015-12-02 NOTE — Patient Instructions (Addendum)
Continue taking Coumadin 2.5 mg on Tuesday and Saturday & take Coumadin 5 mg all other days. Return for INR check in ...  Bring a copy of your advanced directives to your next office visit.  Advance Directive Advance directives are the legal documents that allow you to make choices about your health care and medical treatment if you cannot speak for yourself. Advance directives are a way for you to communicate your wishes to family, friends, and health care providers. The specified people can then convey your decisions about end-of-life care to avoid confusion if you should become unable to communicate. Ideally, the process of discussing and writing advance directives should happen over time rather than making decisions all at once. Advance directives can be modified as your situation changes, and you can change your mind at any time, even after you have signed the advance directives. Each state has its own laws regarding advance directives. You may want to check with your health care provider, attorney, or state representative about the law in your state. Below are some examples of advance directives. LIVING WILL A living will is a set of instructions documenting your wishes about medical care when you cannot care for yourself. It is used if you become:  Terminally ill.  Incapacitated.  Unable to communicate.  Unable to make decisions. Items to consider in your living will include:  The use or non-use of life-sustaining equipment, such as dialysis machines and breathing machines (ventilators).  A do not resuscitate (DNR) order, which is the instruction not to use cardiopulmonary resuscitation (CPR) if breathing or heartbeat stops.  Tube feeding.  Withholding of food and fluids.  Comfort (palliative) care when the goal becomes comfort rather than a cure.  Organ and tissue donation. A living will does not give instructions about distribution of your money and property if you should pass  away. It is advisable to seek the expert advice of a lawyer in drawing up a will regarding your possessions. Decisions about taxes, beneficiaries, and asset distribution will be legally binding. This process can relieve your family and friends of any burdens surrounding disputes or questions that may come up about the allocation of your assets. DO NOT RESUSCITATE (DNR) A do not resuscitate (DNR) order is a request to not have CPR in the event that your heart stops beating or you stop breathing. Unless given other instructions, a health care provider will try to help any patient whose heart has stopped or who has stopped breathing.  HEALTH CARE PROXY AND DURABLE POWER OF ATTORNEY FOR HEALTH CARE A health care proxy is a person (agent) appointed to make medical decisions for you if you cannot. Generally, people choose someone they know well and trust to represent their preferences when they can no longer do so. You should be sure to ask this person for agreement to act as your agent. An agent may have to exercise judgment in the event of a medical decision for which your wishes are not known. The durable power of attorney for health care is the legal document that names your health care proxy. Once written, it should be:  Signed.  Notarized.  Dated.  Copied.  Witnessed.  Incorporated into your medical record. You may also want to appoint someone to manage your financial affairs if you cannot. This is called a durable power of attorney for finances. It is a separate legal document from the durable power of attorney for health care. You may choose the same person or someone  different from your health care proxy to act as your agent in financial matters.   This information is not intended to replace advice given to you by your health care provider. Make sure you discuss any questions you have with your health care provider.   Document Released: 05/02/2007 Document Revised: 01/28/2013 Document  Reviewed: 06/12/2012 Elsevier Interactive Patient Education [employer].

## 2015-12-05 NOTE — Assessment & Plan Note (Signed)
Encouraged to get adequate exercise, calcium and vitamin d intake 

## 2015-12-05 NOTE — Progress Notes (Signed)
Patient ID: Mikayla [name], female   DOB: Jul 13, 1940, 75 y.o.   MRN: [id number]   Subjective:    Patient ID: Mikayla [name], female    DOB: 1940-11-14, 75 y.o.   MRN: [id number]  Chief Complaint  Patient presents with  . Medicare Wellness  . Hypertension  . Hyperlipidemia  . Leg Swelling    HPI Patient is in today for follow up on numerous medical concerns. No recent illness or hospitalization. Denies any falls or trauma. Is not having any trouble at home with ADLs and is eating well. Denies CP/palp/SOB/HA/congestion/fevers/GI or GU c/o. Taking meds as prescribed  Past Medical History:  Diagnosis Date  . Abdominal pain 08/25/2014  . Acute venous embolism and thrombosis of unspecified deep vessels of lower extremity    after broken foot screw left in place, left leg  . Anemia   . Aortic stenosis    echo 11/11: EF 60%, mild LVH, mod/severe AS with mean 32 mmHg, peak 55 mmHg, mild AI, mild MR, (previous echo 2010 with mean gradient 29 mmHg)  . Atrial tachycardia ([place])   . Bilateral dry eyes   . Brain aneurysm 01/16/2015  . Carotid stenosis    dopplers 11/11: 0-39% bilat  . Dehydration 08/25/2014  . Dyslipidemia   . GERD (gastroesophageal reflux disease)   . H/O measles   . History of chicken pox   . Hyperlipidemia   . Hyperlipidemia, mild 10/08/2014  . Osteopenia 10/08/2014  . Other congenital anomaly of toes   . Pain in joint, pelvic region and thigh   . Palpitations    PACs and atrial tachy  . Peripheral edema 08/25/2014    Past Surgical History:  Procedure Laterality Date  . CHOLECYSTECTOMY    . FOOT SURGERY    . hyroglossal duct cyst resection    . LEFT AND RIGHT HEART CATHETERIZATION WITH CORONARY ANGIOGRAM N/A 03/21/2013   Procedure: LEFT AND RIGHT HEART CATHETERIZATION WITH CORONARY ANGIOGRAM;  Surgeon: Minus Breeding, MD;  Location[facility] CATH LAB;  Service: Cardiovascular;  Laterality: N/A;  . left hip replacement    . RADICAL HYSTERECTOMY    . TONSILLECTOMY      Family  History  Problem Relation Age of Onset  . Heart disease Mother   . Hypertension Mother 58  . Heart disease Father 51  . Cancer Maternal Aunt     Breast  . Heart disease Maternal Grandmother   . Heart disease Maternal Grandfather   . Hernia Daughter   . Gallstones Daughter     Social History   Social History  . Marital status: Divorced    Spouse name: N/A  . Number of children: N/A  . Years of education: N/A   Occupational History  . Not on file.   Social History Main Topics  . Smoking status: Never Smoker  . Smokeless tobacco: Not on file  . Alcohol use No  . Drug use: No  . 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 Not on file   Social History Narrative  . No narrative on file    Outpatient Medications Prior to Visit  Medication Sig Dispense Refill  . acetaminophen (TYLENOL) 500 MG tablet Take 1 tablet by mouth every 8 (eight) hours as needed for mild pain or headache.     . calcium citrate (CALCITRATE - DOSED IN MG ELEMENTAL CALCIUM) 950 MG tablet Take 200 mg of  elemental calcium by mouth daily.    Marland Kitchen CARTIA XT 240 MG 24 hr capsule TAKE ONE CAPSULE BY MOUTH ONCE DAILY 90 capsule 0  . furosemide (LASIX) 20 MG tablet Take 1 tablet (20 mg total) by mouth daily as needed for edema. 30 tablet 1  . gabapentin (NEURONTIN) 800 MG tablet Take 1 tablet (800 mg total) by mouth 3 (three) times daily. (Patient taking differently: Take 800 mg by mouth 2 (two) times daily. ) 90 tablet 0  . hyoscyamine (LEVSIN SL) 0.125 MG SL tablet Place 1 tablet (0.125 mg total) under the tongue every 4 (four) hours as needed. 30 tablet 0  . metoprolol tartrate (LOPRESSOR) 25 MG tablet Take 1 tablet (25 mg total) by mouth 2 (two) times daily. 60 tablet 6  . Multiple Vitamin (MULTIVITAMIN) tablet Take 1 tablet by mouth daily.      . Omega 3 1000 MG CAPS Take 1,000 mg by mouth daily. Reported on 03/24/2015    .  omeprazole (PRILOSEC) 40 MG capsule TAKE ONE CAPSULE BY MOUTH ONCE DAILY (Patient taking differently: TAKE ONE CAPSULE BY MOUTH ONCE EVERY OTHER DAILY) 90 capsule 5  . Polyethyl Glycol-Propyl Glycol (SYSTANE OP) Apply 1 drop to eye 2 (two) times daily as needed. For dry eyes    . Probiotic Product (PHILLIPS COLON HEALTH PO) Take 1 capsule by mouth daily.    . traMADol (ULTRAM) 50 MG tablet Take 1 tablet (50 mg total) by mouth every 8 (eight) hours as needed. 40 tablet 0  . warfarin (COUMADIN) 5 MG tablet Take daily as directed by coumadin clinic. 30 tablet 5  . desipramine (NOPRAMIN) 10 MG tablet Take 3 tablets by mouth daily (Patient not taking: Reported on 12/02/2015) 90 tablet 1  . methocarbamol (ROBAXIN) 500 MG tablet Take 500 mg by mouth daily.      No facility-administered medications prior to visit.     Allergies  Allergen Reactions  . Codeine Nausea And Vomiting  . Indomethacin Nausea And Vomiting    Caused chest pain.    Review of Systems  Constitutional: Negative for chills, fever and malaise/fatigue.  HENT: Negative for congestion and hearing loss.   Eyes: Negative for discharge.  Respiratory: Negative for cough, sputum production and shortness of breath.   Cardiovascular: Negative for chest pain, palpitations and leg swelling.  Gastrointestinal: Negative for abdominal pain, blood in stool, constipation, diarrhea, heartburn, nausea and vomiting.  Genitourinary: Negative for dysuria, frequency, hematuria and urgency.  Musculoskeletal: Negative for back pain, falls and myalgias.  Skin: Negative for rash.  Neurological: Negative for dizziness, sensory change, loss of consciousness, weakness and headaches.  Endo/Heme/Allergies: Negative for environmental allergies. Does not bruise/bleed easily.  Psychiatric/Behavioral: Negative for depression and suicidal ideas. The patient is not nervous/anxious and does not have insomnia.        Objective:    Physical Exam    Constitutional: She is oriented to person, place, and time. She appears well-developed and well-nourished. No distress.  HENT:  Head: Normocephalic and atraumatic.  Eyes: Conjunctivae are normal.  Neck: Neck supple. No thyromegaly present.  Cardiovascular: Normal rate, regular rhythm and normal heart sounds.   No murmur heard. Pulmonary/Chest: Effort normal and breath sounds normal. No respiratory distress.  Abdominal: Soft. Bowel sounds are normal. She exhibits no distension and no mass. There is no tenderness.  Musculoskeletal: She exhibits no edema.  Lymphadenopathy:    She has no cervical adenopathy.  Neurological: She is alert and oriented to person, place, and  time.  Skin: Skin is warm and dry.  Psychiatric: She has a normal mood and affect. Her behavior is normal.    BP 120/68 (BP Location: Left Arm, Patient Position: Sitting, Cuff Size: Normal)   Pulse 62   Resp 16   Ht 5\' 5"  (1.651 m)   Wt 177 lb 9.6 oz (80.6 kg)   SpO2 98%   BMI 29.55 kg/m  Wt Readings from Last 3 Encounters:  12/02/15 177 lb 9.6 oz (80.6 kg)  08/04/15 173 lb (78.5 kg)  06/01/15 173 lb 2 oz (78.5 kg)     Lab Results  Component Value Date   WBC 6.3 01/15/2015   HGB 13.0 01/15/2015   HCT 38.9 01/15/2015   PLT 187 01/15/2015   GLUCOSE 101 (H) 01/15/2015   CHOL 199 01/07/2015   TRIG 88.0 01/07/2015   HDL 57.30 01/07/2015   LDLCALC 124 (H) 01/07/2015   ALT 18 01/07/2015   AST 18 01/07/2015   NA 141 01/15/2015   K 3.8 01/15/2015   CL 104 01/15/2015   CREATININE 0.67 01/15/2015   BUN 12 01/15/2015   CO2 28 01/15/2015   TSH 1.90 01/07/2015   INR 1.9 12/02/2015    Lab Results  Component Value Date   TSH 1.90 01/07/2015   Lab Results  Component Value Date   WBC 6.3 01/15/2015   HGB 13.0 01/15/2015   HCT 38.9 01/15/2015   MCV 93.7 01/15/2015   PLT 187 01/15/2015   Lab Results  Component Value Date   NA 141 01/15/2015   K 3.8 01/15/2015   CO2 28 01/15/2015   GLUCOSE 101 (H)  01/15/2015   BUN 12 01/15/2015   CREATININE 0.67 01/15/2015   BILITOT 0.6 01/07/2015   ALKPHOS 76 01/07/2015   AST 18 01/07/2015   ALT 18 01/07/2015   PROT 6.9 01/07/2015   ALBUMIN 3.9 01/07/2015   CALCIUM 9.1 01/15/2015   ANIONGAP 9 01/15/2015   GFR 94.13 01/07/2015   Lab Results  Component Value Date   CHOL 199 01/07/2015   Lab Results  Component Value Date   HDL 57.30 01/07/2015   Lab Results  Component Value Date   LDLCALC 124 (H) 01/07/2015   Lab Results  Component Value Date   TRIG 88.0 01/07/2015   Lab Results  Component Value Date   CHOLHDL 3 01/07/2015   No results found for: HGBA1C     Assessment & Plan:   Problem List Items Addressed This Visit    History of deep venous thrombosis    Tolerating Coumadin      Benign essential HTN    Well controlled, no changes to meds. Encouraged heart healthy diet such as the DASH diet and exercise as tolerated.       History of DVT (deep vein thrombosis)   Relevant Orders   POCT INR (Completed)   Osteopenia    Encouraged to get adequate exercise, calcium and vitamin d intake       Other Visit Diagnoses    Encounter for Medicare annual wellness exam    -  Primary   Need for pneumococcal vaccination       Relevant Orders   Pneumococcal 23 (Completed)      I have discontinued Ms. Huser's methocarbamol. I am also having her maintain her multivitamin, Omega 3, Polyethyl Glycol-Propyl Glycol (SYSTANE OP), acetaminophen, hyoscyamine, furosemide, calcium citrate, traMADol, Probiotic Product ([place] PO), metoprolol tartrate, omeprazole, desipramine, gabapentin, CARTIA XT, and warfarin.  No orders of the  defined types were placed in this encounter.    Penni Homans, MD

## 2015-12-05 NOTE — Assessment & Plan Note (Signed)
Tolerating Coumadin 

## 2015-12-05 NOTE — Assessment & Plan Note (Signed)
Well controlled, no changes to meds. Encouraged heart healthy diet such as the DASH diet and exercise as tolerated.  °

## 2015-12-21 DIAGNOSIS — Z961 Presence of intraocular lens: Secondary | ICD-10-CM | POA: Diagnosis not present

## 2015-12-21 DIAGNOSIS — H5203 Hypermetropia, bilateral: Secondary | ICD-10-CM | POA: Diagnosis not present

## 2015-12-21 DIAGNOSIS — H47093 Other disorders of optic nerve, not elsewhere classified, bilateral: Secondary | ICD-10-CM | POA: Diagnosis not present

## 2015-12-21 DIAGNOSIS — H35363 Drusen (degenerative) of macula, bilateral: Secondary | ICD-10-CM | POA: Diagnosis not present

## 2015-12-21 DIAGNOSIS — H524 Presbyopia: Secondary | ICD-10-CM | POA: Diagnosis not present

## 2015-12-21 DIAGNOSIS — H353131 Nonexudative age-related macular degeneration, bilateral, early dry stage: Secondary | ICD-10-CM | POA: Diagnosis not present

## 2015-12-21 DIAGNOSIS — H52203 Unspecified astigmatism, bilateral: Secondary | ICD-10-CM | POA: Diagnosis not present

## 2015-12-23 ENCOUNTER — Ambulatory Visit (INDEPENDENT_AMBULATORY_CARE_PROVIDER_SITE_OTHER): Payer: Medicare Other | Admitting: Behavioral Health

## 2015-12-23 DIAGNOSIS — Z86718 Personal history of other venous thrombosis and embolism: Secondary | ICD-10-CM

## 2015-12-23 LAB — POCT INR: INR: 1.7

## 2015-12-23 NOTE — Progress Notes (Signed)
RN INR note reviewed. Agree with documention and plan. 

## 2015-12-23 NOTE — Progress Notes (Signed)
Pre visit review using our clinic review tool, if applicable. No additional management support is needed unless otherwise documented below in the visit note.  Patient presents in clinic for INR check. Medication & regimen reviewed with the patient. She did not report any positive findings. Today's INR reading was 1.7.  Per Dr. Charlett Blake: Continue taking Coumadin 2.5 mg on Tuesdays and Saturdays & Coumadin 5 mg all other days. Return for INR check on Tuesday, 12/28/15.  Informed patient of the provider's instructions. She verbalized understanding and did not have any further concerns before leaving the nurse visit.

## 2015-12-23 NOTE — Patient Instructions (Signed)
Per Dr. Charlett Blake: Continue taking Coumadin 2.5 mg on Tuesdays and Saturdays & Coumadin 5 mg all other days. Return for INR check on Tuesday, 12/28/15.

## 2015-12-28 ENCOUNTER — Ambulatory Visit: Payer: Medicare Other

## 2015-12-28 ENCOUNTER — Encounter: Payer: Self-pay | Admitting: Family Medicine

## 2015-12-28 ENCOUNTER — Ambulatory Visit (INDEPENDENT_AMBULATORY_CARE_PROVIDER_SITE_OTHER): Payer: Medicare Other | Admitting: Family Medicine

## 2015-12-28 VITALS — BP 120/72 | HR 59 | Temp 98.0°F | Ht 65.0 in | Wt 181.4 lb

## 2015-12-28 DIAGNOSIS — E785 Hyperlipidemia, unspecified: Secondary | ICD-10-CM | POA: Diagnosis not present

## 2015-12-28 DIAGNOSIS — L989 Disorder of the skin and subcutaneous tissue, unspecified: Secondary | ICD-10-CM | POA: Diagnosis not present

## 2015-12-28 DIAGNOSIS — I10 Essential (primary) hypertension: Secondary | ICD-10-CM

## 2015-12-28 DIAGNOSIS — Z86718 Personal history of other venous thrombosis and embolism: Secondary | ICD-10-CM | POA: Diagnosis not present

## 2015-12-28 DIAGNOSIS — M8580 Other specified disorders of bone density and structure, unspecified site: Secondary | ICD-10-CM

## 2015-12-28 LAB — POCT INR: INR: 1.9

## 2015-12-28 MED ORDER — FUROSEMIDE 20 MG PO TABS: ORAL_TABLET | ORAL | 1 refills | Status: DC

## 2015-12-28 NOTE — Progress Notes (Signed)
Pre visit review using our clinic review tool, if applicable. No additional management support is needed unless otherwise documented below in the visit note. 

## 2015-12-28 NOTE — Patient Instructions (Signed)
DASH Eating Plan DASH stands for "Dietary Approaches to Stop Hypertension." The DASH eating plan is a healthy eating plan that has been shown to reduce high blood pressure (hypertension). Additional health benefits may include reducing the risk of type 2 diabetes mellitus, heart disease, and stroke. The DASH eating plan may also help with weight loss. What do I need to know about the DASH eating plan? For the DASH eating plan, you will follow these general guidelines:  Choose foods with less than 150 milligrams of sodium per serving (as listed on the food label).  Use salt-free seasonings or herbs instead of table salt or sea salt.  Check with your health care provider or pharmacist before using salt substitutes.  Eat lower-sodium products. These are often labeled as "low-sodium" or "no salt added."  Eat fresh foods. Avoid eating a lot of canned foods.  Eat more vegetables, fruits, and low-fat dairy products.  Choose whole grains. Look for the word "whole" as the first word in the ingredient list.  Choose fish and skinless chicken or turkey more often than red meat. Limit fish, poultry, and meat to 6 oz (170 g) each day.  Limit sweets, desserts, sugars, and sugary drinks.  Choose heart-healthy fats.  Eat more home-cooked food and less restaurant, buffet, and fast food.  Limit fried foods.  Do not fry foods. Cook foods using methods such as baking, boiling, grilling, and broiling instead.  When eating at a restaurant, ask that your food be prepared with less salt, or no salt if possible. What foods can I eat? Seek help from a dietitian for individual calorie needs. Grains  Whole grain or whole wheat bread. Brown rice. Whole grain or whole wheat pasta. Quinoa, bulgur, and whole grain cereals. Low-sodium cereals. Corn or whole wheat flour tortillas. Whole grain cornbread. Whole grain crackers. Low-sodium crackers. Vegetables  Fresh or frozen vegetables (raw, steamed, roasted, or  grilled). Low-sodium or reduced-sodium tomato and vegetable juices. Low-sodium or reduced-sodium tomato sauce and paste. Low-sodium or reduced-sodium canned vegetables. Fruits  All fresh, canned (in natural juice), or frozen fruits. Meat and Other Protein Products  Ground beef (85% or leaner), grass-fed beef, or beef trimmed of fat. Skinless chicken or turkey. Ground chicken or turkey. Pork trimmed of fat. All fish and seafood. Eggs. Dried beans, peas, or lentils. Unsalted nuts and seeds. Unsalted canned beans. Dairy  Low-fat dairy products, such as skim or 1% milk, 2% or reduced-fat cheeses, low-fat ricotta or cottage cheese, or plain low-fat yogurt. Low-sodium or reduced-sodium cheeses. Fats and Oils  Tub margarines without trans fats. Light or reduced-fat mayonnaise and salad dressings (reduced sodium). Avocado. Safflower, olive, or canola oils. Natural peanut or almond butter. Other  Unsalted popcorn and pretzels. The items listed above may not be a complete list of recommended foods or beverages. Contact your dietitian for more options.  What foods are not recommended? Grains  White bread. White pasta. White rice. Refined cornbread. Bagels and croissants. Crackers that contain trans fat. Vegetables  Creamed or fried vegetables. Vegetables in a cheese sauce. Regular canned vegetables. Regular canned tomato sauce and paste. Regular tomato and vegetable juices. Fruits  Canned fruit in light or heavy syrup. Fruit juice. Meat and Other Protein Products  Fatty cuts of meat. Ribs, chicken wings, bacon, sausage, bologna, salami, chitterlings, fatback, hot dogs, bratwurst, and packaged luncheon meats. Salted nuts and seeds. Canned beans with salt. Dairy  Whole or 2% milk, cream, half-and-half, and cream cheese. Whole-fat or sweetened yogurt. Full-fat cheeses   or blue cheese. Nondairy creamers and whipped toppings. Processed cheese, cheese spreads, or cheese curds. Condiments  Onion and garlic  salt, seasoned salt, table salt, and sea salt. Canned and packaged gravies. Worcestershire sauce. Tartar sauce. Barbecue sauce. Teriyaki sauce. Soy sauce, including reduced sodium. Steak sauce. Fish sauce. Oyster sauce. Cocktail sauce. Horseradish. Ketchup and mustard. Meat flavorings and tenderizers. Bouillon cubes. Hot sauce. Tabasco sauce. Marinades. Taco seasonings. Relishes. Fats and Oils  Butter, stick margarine, lard, shortening, ghee, and bacon fat. Coconut, palm kernel, or palm oils. Regular salad dressings. Other  Pickles and olives. Salted popcorn and pretzels. The items listed above may not be a complete list of foods and beverages to avoid. Contact your dietitian for more information.  Where can I find more information? National Heart, Lung, and [employer] Institute: www.nhlbi.nih.gov/health/health-topics/topics/dash/ This information is not intended to replace advice given to you by your health care provider. Make sure you discuss any questions you have with your health care provider. Document Released: 01/12/2011 Document Revised: 07/01/2015 Document Reviewed: 11/27/2012 Elsevier Interactive Patient Education  2017 Elsevier Inc.  

## 2016-01-09 DIAGNOSIS — L989 Disorder of the skin and subcutaneous tissue, unspecified: Secondary | ICD-10-CM | POA: Insufficient documentation

## 2016-01-09 NOTE — Assessment & Plan Note (Signed)

## 2016-01-09 NOTE — Assessment & Plan Note (Signed)
Referred to dermatology for further consideration.  

## 2016-01-09 NOTE — Progress Notes (Signed)
Patient ID: Mikayla [name], female   DOB: 13-Jan-1941, 75 y.o.   MRN: [id number]   Subjective:    Patient ID: Mikayla [name], female    DOB: 08-23-1940, 75 y.o.   MRN: [id number]  Chief Complaint  Patient presents with  . Nevus    HPI Patient is in today for follow up. She notes a changing lesion on her right leg she would like evaluated. It is not pruritic or bleeding. No recent illness or hospitalizations. Is complaining of some mild ankle swelling bilaterally. Worse at end of day and better in am. Denies CP/palp/SOB/HA/congestion/fevers/GI or GU c/o. Taking meds as prescribed  Past Medical History:  Diagnosis Date  . Abdominal pain 08/25/2014  . Acute venous embolism and thrombosis of unspecified deep vessels of lower extremity    after broken foot screw left in place, left leg  . Anemia   . Aortic stenosis    echo 11/11: EF 60%, mild LVH, mod/severe AS with mean 32 mmHg, peak 55 mmHg, mild AI, mild MR, (previous echo 2010 with mean gradient 29 mmHg)  . Atrial tachycardia ([place])   . Bilateral dry eyes   . Brain aneurysm 01/16/2015  . Carotid stenosis    dopplers 11/11: 0-39% bilat  . Dehydration 08/25/2014  . Dyslipidemia   . GERD (gastroesophageal reflux disease)   . H/O measles   . History of chicken pox   . Hyperlipidemia   . Hyperlipidemia, mild 10/08/2014  . Osteopenia 10/08/2014  . Other congenital anomaly of toes   . Pain in joint, pelvic region and thigh   . Palpitations    PACs and atrial tachy  . Peripheral edema 08/25/2014    Past Surgical History:  Procedure Laterality Date  . CHOLECYSTECTOMY    . FOOT SURGERY    . hyroglossal duct cyst resection    . LEFT AND RIGHT HEART CATHETERIZATION WITH CORONARY ANGIOGRAM N/A 03/21/2013   Procedure: LEFT AND RIGHT HEART CATHETERIZATION WITH CORONARY ANGIOGRAM;  Surgeon: Minus Breeding, MD;  Location: [facility] CATH LAB;  Service: Cardiovascular;  Laterality: N/A;  . left hip replacement    . RADICAL HYSTERECTOMY    . TONSILLECTOMY       Family History  Problem Relation Age of Onset  . Heart disease Mother   . Hypertension Mother 43  . Heart disease Father 44  . Cancer Maternal Aunt     Breast  . Heart disease Maternal Grandmother   . Heart disease Maternal Grandfather   . Hernia Daughter   . Gallstones Daughter     Social History   Social History  . Marital status: Divorced    Spouse name: N/A  . Number of children: N/A  . Years of education: N/A   Occupational History  . Not on file.   Social History Main Topics  . Smoking status: Never Smoker  . Smokeless tobacco: Not on file  . Alcohol use No  . Drug use: No  . 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 Not on file   Social History Narrative  . No narrative on file    Outpatient Medications Prior to Visit  Medication Sig Dispense Refill  . acetaminophen (TYLENOL) 500 MG tablet Take 1 tablet by mouth every 8 (eight) hours as needed for mild pain or headache.     . calcium citrate (CALCITRATE - DOSED IN MG ELEMENTAL CALCIUM) 950 MG tablet Take  200 mg of elemental calcium by mouth daily.    Marland Kitchen CARTIA XT 240 MG 24 hr capsule TAKE ONE CAPSULE BY MOUTH ONCE DAILY 90 capsule 0  . desipramine (NOPRAMIN) 10 MG tablet Take 3 tablets by mouth daily 90 tablet 1  . gabapentin (NEURONTIN) 800 MG tablet Take 1 tablet (800 mg total) by mouth 3 (three) times daily. (Patient taking differently: Take 800 mg by mouth 2 (two) times daily. ) 90 tablet 0  . hyoscyamine (LEVSIN SL) 0.125 MG SL tablet Place 1 tablet (0.125 mg total) under the tongue every 4 (four) hours as needed. 30 tablet 0  . metoprolol tartrate (LOPRESSOR) 25 MG tablet Take 1 tablet (25 mg total) by mouth 2 (two) times daily. 60 tablet 6  . Multiple Vitamin (MULTIVITAMIN) tablet Take 1 tablet by mouth daily.      . Omega 3 1000 MG CAPS Take 1,000 mg by mouth daily. Reported on 03/24/2015    . omeprazole (PRILOSEC) 40  MG capsule TAKE ONE CAPSULE BY MOUTH ONCE DAILY (Patient taking differently: TAKE ONE CAPSULE BY MOUTH ONCE EVERY OTHER DAILY) 90 capsule 5  . Polyethyl Glycol-Propyl Glycol (SYSTANE OP) Apply 1 drop to eye 2 (two) times daily as needed. For dry eyes    . Probiotic Product (PHILLIPS COLON HEALTH PO) Take 1 capsule by mouth daily.    . traMADol (ULTRAM) 50 MG tablet Take 1 tablet (50 mg total) by mouth every 8 (eight) hours as needed. 40 tablet 0  . warfarin (COUMADIN) 5 MG tablet Take daily as directed by coumadin clinic. 30 tablet 5  . furosemide (LASIX) 20 MG tablet Take 1 tablet (20 mg total) by mouth daily as needed for edema. 30 tablet 1   No facility-administered medications prior to visit.     Allergies  Allergen Reactions  . Codeine Nausea And Vomiting  . Indomethacin Nausea And Vomiting    Caused chest pain.    Review of Systems  Constitutional: Negative for fever and malaise/fatigue.  HENT: Positive for sore throat. Negative for congestion.   Eyes: Negative for blurred vision.  Respiratory: Negative for shortness of breath.   Cardiovascular: Positive for leg swelling. Negative for chest pain and palpitations.  Gastrointestinal: Negative for abdominal pain, blood in stool and nausea.  Genitourinary: Negative for dysuria and frequency.  Musculoskeletal: Negative for falls.  Skin: Negative for rash.  Neurological: Negative for dizziness, loss of consciousness and headaches.  Endo/Heme/Allergies: Negative for environmental allergies.  Psychiatric/Behavioral: Negative for depression. The patient is not nervous/anxious.        Objective:    Physical Exam  Constitutional: She is oriented to person, place, and time. She appears well-developed and well-nourished. No distress.  HENT:  Head: Normocephalic and atraumatic.  Nose: Nose normal.  Eyes: Right eye exhibits no discharge. Left eye exhibits no discharge.  Neck: Normal range of motion. Neck supple.  Cardiovascular:  Normal rate and regular rhythm.   Murmur heard. Pulmonary/Chest: Effort normal and breath sounds normal.  Abdominal: Soft. Bowel sounds are normal. There is no tenderness.  Musculoskeletal: She exhibits edema.  Trace pedal edema b/l  Neurological: She is alert and oriented to person, place, and time.  Skin: Skin is warm and dry.  1 cm raised lesion on RLE   Psychiatric: She has a normal mood and affect.  Nursing note and vitals reviewed.   BP 120/72 (BP Location: Left Arm, Patient Position: Sitting, Cuff Size: Large)   Pulse (!) 59   Temp 98  F (36.7 C) (Oral)   Ht 5\' 5"  (1.651 m)   Wt 181 lb 6 oz (82.3 kg)   SpO2 99%   BMI 30.18 kg/m  Wt Readings from Last 3 Encounters:  12/28/15 181 lb 6 oz (82.3 kg)  12/02/15 177 lb 9.6 oz (80.6 kg)  08/04/15 173 lb (78.5 kg)     Lab Results  Component Value Date   WBC 6.3 01/15/2015   HGB 13.0 01/15/2015   HCT 38.9 01/15/2015   PLT 187 01/15/2015   GLUCOSE 101 (H) 01/15/2015   CHOL 199 01/07/2015   TRIG 88.0 01/07/2015   HDL 57.30 01/07/2015   LDLCALC 124 (H) 01/07/2015   ALT 18 01/07/2015   AST 18 01/07/2015   NA 141 01/15/2015   K 3.8 01/15/2015   CL 104 01/15/2015   CREATININE 0.67 01/15/2015   BUN 12 01/15/2015   CO2 28 01/15/2015   TSH 1.90 01/07/2015   INR 1.9 12/28/2015    Lab Results  Component Value Date   TSH 1.90 01/07/2015   Lab Results  Component Value Date   WBC 6.3 01/15/2015   HGB 13.0 01/15/2015   HCT 38.9 01/15/2015   MCV 93.7 01/15/2015   PLT 187 01/15/2015   Lab Results  Component Value Date   NA 141 01/15/2015   K 3.8 01/15/2015   CO2 28 01/15/2015   GLUCOSE 101 (H) 01/15/2015   BUN 12 01/15/2015   CREATININE 0.67 01/15/2015   BILITOT 0.6 01/07/2015   ALKPHOS 76 01/07/2015   AST 18 01/07/2015   ALT 18 01/07/2015   PROT 6.9 01/07/2015   ALBUMIN 3.9 01/07/2015   CALCIUM 9.1 01/15/2015   ANIONGAP 9 01/15/2015   GFR 94.13 01/07/2015   Lab Results  Component Value Date   CHOL 199  01/07/2015   Lab Results  Component Value Date   HDL 57.30 01/07/2015   Lab Results  Component Value Date   LDLCALC 124 (H) 01/07/2015   Lab Results  Component Value Date   TRIG 88.0 01/07/2015   Lab Results  Component Value Date   CHOLHDL 3 01/07/2015   No results found for: HGBA1C     Assessment & Plan:   Problem List Items Addressed This Visit    Benign essential HTN    Well controlled, no changes to meds. Encouraged heart healthy diet such as the DASH diet and exercise as tolerated.       Relevant Medications   furosemide (LASIX) 20 MG tablet   History of DVT (deep vein thrombosis) - Primary    No recent concerning symptoms.      Relevant Orders   POCT INR (Completed)   Hyperlipidemia, mild    Encouraged heart healthy diet, increase exercise, avoid trans fats, consider a krill oil cap daily      Relevant Medications   furosemide (LASIX) 20 MG tablet   Osteopenia    Bone density shows osteopenia, which is thinner than normal but not as bad as osteoporosis. Recommend calcium intake of 1200 to 1500 mg daily, divided into roughly 3 doses. Best source is the diet and a single dairy serving is about 500 mg, a supplement of calcium citrate once or twice daily to balance diet is fine if not getting enough in diet. Also need Vitamin D 2000 IU caps, 1 cap daily if not already taking vitamin D. Also recommend weight baring exercise on hips and upper body to keep bones strong      Skin lesion of  right leg    Referred to dermatology for further consideration      Relevant Orders   Ambulatory referral to Dermatology      I have changed Ms. Critz's furosemide. I am also having her maintain her multivitamin, Omega 3, Polyethyl Glycol-Propyl Glycol (SYSTANE OP), acetaminophen, hyoscyamine, calcium citrate, traMADol, Probiotic Product (Wise PO), metoprolol tartrate, omeprazole, desipramine, gabapentin, CARTIA XT, and warfarin.  Meds ordered this encounter    Medications  . furosemide (LASIX) 20 MG tablet    Sig: Daily and prn for weight gain>3# in 24 hours or increased edema    Dispense:  45 tablet    Refill:  1     Jerrye Seebeck, MD

## 2016-01-09 NOTE — Assessment & Plan Note (Signed)
Encouraged heart healthy diet, increase exercise, avoid trans fats, consider a krill oil cap daily 

## 2016-01-09 NOTE — Assessment & Plan Note (Signed)
No recent concerning symptoms.

## 2016-01-09 NOTE — Assessment & Plan Note (Signed)
Well controlled, no changes to meds. Encouraged heart healthy diet such as the DASH diet and exercise as tolerated.  °

## 2016-01-11 ENCOUNTER — Other Ambulatory Visit: Payer: Self-pay | Admitting: Family Medicine

## 2016-01-18 ENCOUNTER — Ambulatory Visit (INDEPENDENT_AMBULATORY_CARE_PROVIDER_SITE_OTHER): Payer: Medicare Other | Admitting: Behavioral Health

## 2016-01-18 DIAGNOSIS — Z86718 Personal history of other venous thrombosis and embolism: Secondary | ICD-10-CM | POA: Diagnosis not present

## 2016-01-18 LAB — POCT INR: INR: 2.1

## 2016-01-18 NOTE — Patient Instructions (Signed)
Per Dr. Charlett Blake: Continue taking Coumadin 2.5 mg on Tuesdays and Saturdays & Coumadin 5 mg all other days. Return for INR check in 4 weeks.

## 2016-01-18 NOTE — Progress Notes (Signed)
Pre visit review using our clinic review tool, if applicable. No additional management support is needed unless otherwise documented below in the visit note.  Patient came in office for INR check. Reviewed current medication regimen. No positive findings or changes were reported today. INR reading was 2.1.  Per Dr. Charlett Blake: Continue taking Coumadin 2.5 mg on Tuesdays and Saturdays & Coumadin 5 mg all other days. Return for INR check in 4 weeks.  Informed patient of the provider's recommendations. She verbalized understanding. Next appointment scheduled for 02/15/16 at 10:30 AM.

## 2016-01-19 DIAGNOSIS — I671 Cerebral aneurysm, nonruptured: Secondary | ICD-10-CM | POA: Diagnosis not present

## 2016-01-25 ENCOUNTER — Other Ambulatory Visit: Payer: Self-pay | Admitting: Family Medicine

## 2016-01-25 DIAGNOSIS — G44309 Post-traumatic headache, unspecified, not intractable: Secondary | ICD-10-CM | POA: Diagnosis not present

## 2016-01-25 DIAGNOSIS — F0781 Postconcussional syndrome: Secondary | ICD-10-CM | POA: Diagnosis not present

## 2016-02-03 DIAGNOSIS — Z6829 Body mass index (BMI) 29.0-29.9, adult: Secondary | ICD-10-CM | POA: Diagnosis not present

## 2016-02-03 DIAGNOSIS — I671 Cerebral aneurysm, nonruptured: Secondary | ICD-10-CM | POA: Diagnosis not present

## 2016-02-03 DIAGNOSIS — I10 Essential (primary) hypertension: Secondary | ICD-10-CM | POA: Diagnosis not present

## 2016-02-04 DIAGNOSIS — D485 Neoplasm of uncertain behavior of skin: Secondary | ICD-10-CM | POA: Diagnosis not present

## 2016-02-04 DIAGNOSIS — Z23 Encounter for immunization: Secondary | ICD-10-CM | POA: Diagnosis not present

## 2016-02-08 ENCOUNTER — Other Ambulatory Visit: Payer: Self-pay | Admitting: Family Medicine

## 2016-02-08 DIAGNOSIS — L821 Other seborrheic keratosis: Secondary | ICD-10-CM | POA: Diagnosis not present

## 2016-02-08 MED ORDER — DILTIAZEM HCL ER COATED BEADS 240 MG PO CP24: 240.0000 mg | ORAL_CAPSULE | Freq: Every day | ORAL | 1 refills | Status: DC

## 2016-02-08 MED ORDER — WARFARIN SODIUM 5 MG PO TABS: ORAL_TABLET | ORAL | 0 refills | Status: DC

## 2016-02-15 ENCOUNTER — Ambulatory Visit (INDEPENDENT_AMBULATORY_CARE_PROVIDER_SITE_OTHER): Payer: Medicare Other | Admitting: Behavioral Health

## 2016-02-15 DIAGNOSIS — Z86718 Personal history of other venous thrombosis and embolism: Secondary | ICD-10-CM | POA: Diagnosis not present

## 2016-02-15 LAB — POCT INR: INR: 2.1

## 2016-02-15 NOTE — Progress Notes (Signed)
Pre visit review using our clinic review tool, if applicable. No additional management support is needed unless otherwise documented below in the visit note.  Patient presents in clinic for INR check. Verified current regimen. She reported no changes; all findings were negative. Today's INR reading was 2.1.  Advised patient to continue taking Coumadin 2.5 mg on Tuesdays and Saturdays & Coumadin 5 mg all other days. Return for INR check in 4 weeks.  She verbalized understanding. Next appointment scheduled for 03/15/16 at 10:30 AM.

## 2016-02-15 NOTE — Patient Instructions (Signed)
Continue taking Coumadin 2.5 mg on Tuesdays and Saturdays & Coumadin 5 mg all other days. Return for INR check in 4 weeks.

## 2016-03-06 ENCOUNTER — Other Ambulatory Visit: Payer: Self-pay | Admitting: Family Medicine

## 2016-03-10 ENCOUNTER — Other Ambulatory Visit: Payer: Self-pay | Admitting: Family Medicine

## 2016-03-10 MED ORDER — HYOSCYAMINE SULFATE 0.125 MG SL SUBL: 0.1250 mg | SUBLINGUAL_TABLET | SUBLINGUAL | 0 refills | Status: DC | PRN

## 2016-03-15 ENCOUNTER — Ambulatory Visit (INDEPENDENT_AMBULATORY_CARE_PROVIDER_SITE_OTHER): Payer: Medicare Other

## 2016-03-15 DIAGNOSIS — Z7901 Long term (current) use of anticoagulants: Secondary | ICD-10-CM | POA: Diagnosis not present

## 2016-03-15 DIAGNOSIS — Z86718 Personal history of other venous thrombosis and embolism: Secondary | ICD-10-CM

## 2016-03-15 LAB — POCT INR: INR: 2.2

## 2016-03-15 NOTE — Patient Instructions (Signed)
Per Mackie Pai covering for Dr Charlett Blake, patient to Continue taking Coumadin 2.5 mg on Tuesdays and Saturdays & Coumadin 5 mg all other days. Return for INR check in 1 month for re-check. Patient has appointment with Dr. Charlett Blake on March 8th.

## 2016-03-15 NOTE — Progress Notes (Addendum)
Pre visit review using our clinic tool,if applicable. No additional management support is needed unless otherwise documented below in the visit note.   Patient in for INR check today.  INR= 2.2 Goal 2.0-3.0  Per Mackie Pai covering for Dr. Lorne Skeens to take Warfarin 5mg - Sunday, Monday, Wednesday, Thursday and Friday. Warfarin 2.5mg -Tuesday and Saturday.  Return to office in 1 month for recheck of INR  Discussed with Santiago Glad LPN. Stay on current regimen. Repeat inr in one month.

## 2016-04-06 ENCOUNTER — Ambulatory Visit: Payer: Medicare Other | Admitting: Family Medicine

## 2016-04-13 ENCOUNTER — Ambulatory Visit (INDEPENDENT_AMBULATORY_CARE_PROVIDER_SITE_OTHER): Payer: Medicare Other | Admitting: Family Medicine

## 2016-04-13 ENCOUNTER — Encounter: Payer: Self-pay | Admitting: Family Medicine

## 2016-04-13 VITALS — BP 124/76 | HR 71 | Temp 97.5°F | Resp 18 | Wt 175.2 lb

## 2016-04-13 DIAGNOSIS — D649 Anemia, unspecified: Secondary | ICD-10-CM

## 2016-04-13 DIAGNOSIS — Z86718 Personal history of other venous thrombosis and embolism: Secondary | ICD-10-CM | POA: Diagnosis not present

## 2016-04-13 DIAGNOSIS — E785 Hyperlipidemia, unspecified: Secondary | ICD-10-CM

## 2016-04-13 DIAGNOSIS — H01119 Allergic dermatitis of unspecified eye, unspecified eyelid: Secondary | ICD-10-CM

## 2016-04-13 DIAGNOSIS — M8580 Other specified disorders of bone density and structure, unspecified site: Secondary | ICD-10-CM

## 2016-04-13 DIAGNOSIS — K219 Gastro-esophageal reflux disease without esophagitis: Secondary | ICD-10-CM | POA: Diagnosis not present

## 2016-04-13 DIAGNOSIS — I10 Essential (primary) hypertension: Secondary | ICD-10-CM

## 2016-04-13 LAB — COMPREHENSIVE METABOLIC PANEL
ALBUMIN: 4.2 g/dL (ref 3.5–5.2)
ALK PHOS: 82 U/L (ref 39–117)
ALT: 14 U/L (ref 0–35)
AST: 17 U/L (ref 0–37)
BILIRUBIN TOTAL: 0.5 mg/dL (ref 0.2–1.2)
BUN: 20 mg/dL (ref 6–23)
CALCIUM: 9.6 mg/dL (ref 8.4–10.5)
CO2: 30 mEq/L (ref 19–32)
CREATININE: 0.71 mg/dL (ref 0.40–1.20)
Chloride: 103 mEq/L (ref 96–112)
GFR: 84.73 mL/min (ref >60.00)
Glucose, Bld: 99 mg/dL (ref 70–99)
Potassium: 4.9 mEq/L (ref 3.5–5.1)
Sodium: 139 mEq/L (ref 135–145)
TOTAL PROTEIN: 7.2 g/dL (ref 6.0–8.3)

## 2016-04-13 LAB — CBC
HCT: 41.4 % (ref 36.0–46.0)
Hemoglobin: 13.7 g/dL (ref 12.0–15.0)
MCHC: 33 g/dL (ref 30.0–36.0)
MCV: 91.5 fl (ref 78.0–100.0)
PLATELETS: 264 10*3/uL (ref 150.0–400.0)
RBC: 4.53 Mil/uL (ref 3.87–5.11)
RDW: 14.5 % (ref 11.5–15.5)
WBC: 8.1 10*3/uL (ref 4.0–10.5)

## 2016-04-13 LAB — TSH: TSH: 1.96 u[IU]/mL (ref 0.35–4.50)

## 2016-04-13 LAB — PROTIME-INR
INR: 1.9 ratio — AB (ref 0.8–1.0)
PROTHROMBIN TIME: 20.2 s — AB (ref 9.6–13.1)

## 2016-04-13 LAB — LIPID PANEL
CHOL/HDL RATIO: 3
CHOLESTEROL: 194 mg/dL (ref 0–200)
HDL: 60.7 mg/dL (ref >39.00)
LDL Cholesterol: 120 mg/dL — ABNORMAL HIGH (ref 0–99)
NonHDL: 133.66
TRIGLYCERIDES: 66 mg/dL (ref 0.0–149.0)
VLDL: 13.2 mg/dL (ref 0.0–40.0)

## 2016-04-13 MED ORDER — GABAPENTIN 800 MG PO TABS: ORAL_TABLET | ORAL | 0 refills | Status: DC

## 2016-04-13 MED ORDER — HYOSCYAMINE SULFATE 0.125 MG SL SUBL
0.1250 mg | SUBLINGUAL_TABLET | SUBLINGUAL | 0 refills | Status: DC | PRN
Start: 2016-04-13 — End: 2017-10-18

## 2016-04-13 NOTE — Assessment & Plan Note (Signed)
Dropped the Gabapentin to bid and that was tolerated. Tolerated PT last year that was helpful and she was given a heal lift for left shoe

## 2016-04-13 NOTE — Progress Notes (Signed)
Subjective:  I acted as a Education administrator for Dr. Charlett Blake. [place], [place]   Patient ID: Mikayla [name], female    DOB: Nov 12, 1940, 76 y.o.   MRN: [id number]  Chief Complaint  Patient presents with  . Follow-up  . Hypertension    HPI  Patient is in today for follow up on hypertension follow up and INR check. She feels well today. No recent febrile illness but she is noting some dry flaky skin on both upper eyelids she has been washing them with baby shampoo at her opthamologist's suggestion but it has not be helpful thus far. She reports no hospitalizaitons or acute concerns. Denies CP/palp/SOB/HA/congestion/fevers/GI or GU c/o. Taking meds as prescribed  Patient Care Team: Mosie Lukes, MD as PCP - General (Family Medicine) Minus Breeding, MD as Consulting Physician (Cardiology) Linward Natal, MD as Referring Physician (Ophthalmology) Consuella Lose, MD as Consulting Physician (Neurosurgery) Johny Shock. Doy Hutching, MD as Consulting Physician (Neurology)   Past Medical History:  Diagnosis Date  . Abdominal pain 08/25/2014  . Acute venous embolism and thrombosis of unspecified deep vessels of lower extremity    after broken foot screw left in place, left leg  . Anemia   . Aortic stenosis    echo 11/11: EF 60%, mild LVH, mod/severe AS with mean 32 mmHg, peak 55 mmHg, mild AI, mild MR, (previous echo 2010 with mean gradient 29 mmHg)  . Atrial tachycardia ([place])   . Bilateral dry eyes   . Brain aneurysm 01/16/2015  . Carotid stenosis    dopplers 11/11: 0-39% bilat  . Dehydration 08/25/2014  . Dermatitis contact, eyelid 04/16/2016  . Dyslipidemia   . GERD (gastroesophageal reflux disease)   . H/O measles   . History of chicken pox   . Hyperlipidemia   . Hyperlipidemia, mild 10/08/2014  . Osteopenia 10/08/2014  . Other congenital anomaly of toes   . Pain in joint, pelvic region and thigh   . Palpitations    PACs and atrial tachy  . Peripheral edema 08/25/2014    Past Surgical History:    Procedure Laterality Date  . CHOLECYSTECTOMY    . FOOT SURGERY    . hyroglossal duct cyst resection    . LEFT AND RIGHT HEART CATHETERIZATION WITH CORONARY ANGIOGRAM N/A 03/21/2013   Procedure: LEFT AND RIGHT HEART CATHETERIZATION WITH CORONARY ANGIOGRAM;  Surgeon: Minus Breeding, MD;  Location: [facility] CATH LAB;  Service: Cardiovascular;  Laterality: N/A;  . left hip replacement    . RADICAL HYSTERECTOMY    . TONSILLECTOMY      Family History  Problem Relation Age of Onset  . Heart disease Mother   . Hypertension Mother 28  . Heart disease Father 62  . Cancer Maternal Aunt     Breast  . Heart disease Maternal Grandmother   . Heart disease Maternal Grandfather   . Hernia Daughter   . Gallstones Daughter     Social History   Social History  . Marital status: Divorced    Spouse name: N/A  . Number of children: N/A  . Years of education: N/A   Occupational History  . Not on file.   Social History Main Topics  . Smoking status: Never Smoker  . Smokeless tobacco: Never Used  . Alcohol use No  . Drug use: No  . 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 Not on file   Social  History Narrative  . No narrative on file    Outpatient Medications Prior to Visit  Medication Sig Dispense Refill  . acetaminophen (TYLENOL) 500 MG tablet Take 1 tablet by mouth every 8 (eight) hours as needed for mild pain or headache.     . calcium citrate (CALCITRATE - DOSED IN MG ELEMENTAL CALCIUM) 950 MG tablet Take 200 mg of elemental calcium by mouth daily.    Marland Kitchen diltiazem (CARTIA XT) 240 MG 24 hr capsule Take 1 capsule (240 mg total) by mouth daily. 90 capsule 1  . furosemide (LASIX) 20 MG tablet Daily and prn for weight gain>3# in 24 hours or increased edema 45 tablet 1  . metoprolol tartrate (LOPRESSOR) 25 MG tablet TAKE 1 TABLET BY MOUTH 2 (TWO) TIMES DAILY 60 tablet 5  . Multiple Vitamin (MULTIVITAMIN) tablet  Take 1 tablet by mouth daily.      . Omega 3 1000 MG CAPS Take 1,000 mg by mouth daily. Reported on 03/24/2015    . omeprazole (PRILOSEC) 40 MG capsule TAKE ONE CAPSULE BY MOUTH ONCE DAILY (Patient taking differently: TAKE ONE CAPSULE BY MOUTH ONCE EVERY OTHER DAILY) 90 capsule 5  . Polyethyl Glycol-Propyl Glycol (SYSTANE OP) Apply 1 drop to eye 2 (two) times daily as needed. For dry eyes    . Probiotic Product (PHILLIPS COLON HEALTH PO) Take 1 capsule by mouth daily.    . traMADol (ULTRAM) 50 MG tablet Take 1 tablet (50 mg total) by mouth every 8 (eight) hours as needed. 40 tablet 0  . warfarin (COUMADIN) 5 MG tablet Take daily as directed by coumadin clinic. 90 tablet 0  . desipramine (NOPRAMIN) 10 MG tablet Take 3 tablets by mouth daily (Patient not taking: Reported on 04/13/2016) 90 tablet 1  . gabapentin (NEURONTIN) 800 MG tablet TAKE 1 TABLET BY MOUTH 3 (THREE) TIMES DAILY 90 tablet 0  . hyoscyamine (LEVSIN SL) 0.125 MG SL tablet Place 1 tablet (0.125 mg total) under the tongue every 4 (four) hours as needed. 30 tablet 0   No facility-administered medications prior to visit.     Allergies  Allergen Reactions  . Codeine Nausea And Vomiting  . Indomethacin Nausea And Vomiting    Caused chest pain.    Review of Systems  Constitutional: Negative for fever and malaise/fatigue.  HENT: Negative for congestion.   Eyes: Negative for blurred vision.  Respiratory: Negative for cough and shortness of breath.   Cardiovascular: Negative for chest pain, palpitations and leg swelling.  Gastrointestinal: Negative for vomiting.  Musculoskeletal: Positive for back pain.  Skin: Positive for rash.  Neurological: Negative for loss of consciousness and headaches.       Objective:    Physical Exam  Constitutional: She is oriented to person, place, and time. She appears well-developed and well-nourished. No distress.  HENT:  Head: Normocephalic and atraumatic.  Eyes: Conjunctivae are normal.    Neck: Normal range of motion. No thyromegaly present.  Cardiovascular: Normal rate.   Murmur heard. 2/6 Systolic Murmur  Pulmonary/Chest: Effort normal and breath sounds normal. She has no wheezes.  Abdominal: Soft. Bowel sounds are normal. There is no tenderness.  Musculoskeletal: Normal range of motion. She exhibits no edema or deformity.  Neurological: She is alert and oriented to person, place, and time.  Skin: Skin is warm and dry. She is not diaphoretic.  Psychiatric: She has a normal mood and affect.    BP 124/76 (BP Location: Left Arm, Patient Position: Sitting, Cuff Size: Normal)   Pulse  71   Temp 97.5 F (36.4 C) (Oral)   Resp 18   Wt 175 lb 3.2 oz (79.5 kg)   SpO2 97%   BMI 29.15 kg/m  Wt Readings from Last 3 Encounters:  04/13/16 175 lb 3.2 oz (79.5 kg)  12/28/15 181 lb 6 oz (82.3 kg)  12/02/15 177 lb 9.6 oz (80.6 kg)     Lab Results  Component Value Date   WBC 8.1 04/13/2016   HGB 13.7 04/13/2016   HCT 41.4 04/13/2016   PLT 264.0 04/13/2016   GLUCOSE 99 04/13/2016   CHOL 194 04/13/2016   TRIG 66.0 04/13/2016   HDL 60.70 04/13/2016   LDLCALC 120 (H) 04/13/2016   ALT 14 04/13/2016   AST 17 04/13/2016   NA 139 04/13/2016   K 4.9 04/13/2016   CL 103 04/13/2016   CREATININE 0.71 04/13/2016   BUN 20 04/13/2016   CO2 30 04/13/2016   TSH 1.96 04/13/2016   INR 1.9 (H) 04/13/2016    Lab Results  Component Value Date   TSH 1.96 04/13/2016   Lab Results  Component Value Date   WBC 8.1 04/13/2016   HGB 13.7 04/13/2016   HCT 41.4 04/13/2016   MCV 91.5 04/13/2016   PLT 264.0 04/13/2016   Lab Results  Component Value Date   NA 139 04/13/2016   K 4.9 04/13/2016   CO2 30 04/13/2016   GLUCOSE 99 04/13/2016   BUN 20 04/13/2016   CREATININE 0.71 04/13/2016   BILITOT 0.5 04/13/2016   ALKPHOS 82 04/13/2016   AST 17 04/13/2016   ALT 14 04/13/2016   PROT 7.2 04/13/2016   ALBUMIN 4.2 04/13/2016   CALCIUM 9.6 04/13/2016   ANIONGAP 9 01/15/2015   GFR  84.73 04/13/2016   Lab Results  Component Value Date   CHOL 194 04/13/2016   Lab Results  Component Value Date   HDL 60.70 04/13/2016   Lab Results  Component Value Date   LDLCALC 120 (H) 04/13/2016   Lab Results  Component Value Date   TRIG 66.0 04/13/2016   Lab Results  Component Value Date   CHOLHDL 3 04/13/2016   No results found for: HGBA1C     Assessment & Plan:   Problem List Items Addressed This Visit    Benign essential HTN    Well controlled, no changes to meds. Encouraged heart healthy diet such as the DASH diet and exercise as tolerated.       Relevant Orders   CBC (Completed)   Comprehensive metabolic panel (Completed)   TSH (Completed)   Anemia   GERD (gastroesophageal reflux disease)    Avoid offending foods, start probiotics. Do not eat large meals in late evening and consider raising head of bed.       Relevant Medications   hyoscyamine (LEVSIN SL) 0.125 MG SL tablet   Hyperlipidemia, mild    Encouraged heart healthy diet, increase exercise, avoid trans fats, consider a krill oil cap daily      Relevant Orders   Lipid panel (Completed)   Osteopenia    Encouraged to get adequate exercise, calcium and vitamin d intake      Dermatitis contact, eyelid    Encouraged to avoid soaps and makeup. Wipe with [employer]	[place] Astringent daily, if no improvement will need referral to dermatology for further consideration       Other Visit Diagnoses    History of deep vein thrombosis    -  Primary   Relevant Orders   Protime-INR (  Completed)      I am having Ms. Belardo maintain her multivitamin, Omega 3, Polyethyl Glycol-Propyl Glycol (SYSTANE OP), acetaminophen, calcium citrate, traMADol, Probiotic Product ([place]), omeprazole, desipramine, furosemide, metoprolol tartrate, diltiazem, warfarin, hyoscyamine, and gabapentin.  Meds ordered this encounter  Medications  . hyoscyamine (LEVSIN SL) 0.125 MG SL tablet    Sig: Place 1 tablet  (0.125 mg total) under the tongue every 4 (four) hours as needed.    Dispense:  30 tablet    Refill:  0  . gabapentin (NEURONTIN) 800 MG tablet    Sig: TAKE 1 TABLET BY MOUTH 3 (THREE) TIMES DAILY    Dispense:  90 tablet    Refill:  0    CMA served as scribe during this visit. History, Physical and Plan performed by medical provider. Documentation and orders reviewed and attested to.  Penni Homans, MD

## 2016-04-13 NOTE — Patient Instructions (Addendum)
Try Verlee Monte on a cotton pad and Soaps with no dyes/perfumes on the face for eye irratation.  Blepharitis Blepharitis is inflammation of the eyelids. Blepharitis may happen with:  Reddish, scaly skin around the scalp and eyebrows.  Burning or itching of the eyelids.  Eye discharge at night that causes the eyelashes to stick together in the morning.  Eyelashes that fall out.  Sensitivity to light. Follow these instructions at home: Pay attention to any changes in how you look or feel. Follow these instructions to help with your condition: Keeping Clean   Wash your hands often.  Wash your eyelids with warm water or with warm water that is mixed with a small amount of baby shampoo. Do this two times per day or as often as needed.  Wash your face and eyebrows at least once a day.  Use a clean towel each time you dry your eyelids. Do not use this towel to clean or dry other areas of your body. Do not share your towel with anyone. General instructions   Avoid wearing makeup until you get better. Do not share makeup with anyone.  Avoid rubbing your eyes.  Apply warm compresses to your eyes 2 times per day for 10 minutes at a time, or as told by your health care provider.  If you were prescribed an antibiotic ointment or steroid drops, apply or use the medicine as told by your health care provider. Do not stop using the medicine even if you feel better.  Keep all follow-up visits as told by your health care provider. This is important. Contact a health care provider if:  Your eyelids feel hot.  You have blisters or a rash on your eyelids.  The condition does not go away in 2-4 days.  The inflammation gets worse. Get help right away if:  You have pain or redness that gets worse or spreads to other parts of your face.  Your vision changes.  You have pain when looking at lights or moving objects.  You have a fever. This information is not intended to replace advice given  to you by your health care provider. Make sure you discuss any questions you have with your health care provider. Document Released: 01/21/2000 Document Revised: 07/01/2015 Document Reviewed: 05/18/2014 Elsevier Interactive Patient Education  2017 [employer].

## 2016-04-13 NOTE — Assessment & Plan Note (Signed)
Encouraged heart healthy diet, increase exercise, avoid trans fats, consider a krill oil cap daily 

## 2016-04-13 NOTE — Progress Notes (Signed)
Pre visit review using our clinic review tool, if applicable. No additional management support is needed unless otherwise documented below in the visit note. 

## 2016-04-13 NOTE — Assessment & Plan Note (Signed)
Encouraged to get adequate exercise, calcium and vitamin d intake 

## 2016-04-16 ENCOUNTER — Encounter: Payer: Self-pay | Admitting: Family Medicine

## 2016-04-16 DIAGNOSIS — H01119 Allergic dermatitis of unspecified eye, unspecified eyelid: Secondary | ICD-10-CM | POA: Insufficient documentation

## 2016-04-16 NOTE — Assessment & Plan Note (Signed)
Well controlled, no changes to meds. Encouraged heart healthy diet such as the DASH diet and exercise as tolerated.  °

## 2016-04-16 NOTE — Assessment & Plan Note (Addendum)
Encouraged to avoid soaps and makeup. Wipe with [employer]	[place] Astringent daily, if no improvement will need referral to dermatology for further consideration

## 2016-04-16 NOTE — Assessment & Plan Note (Signed)
Avoid offending foods, start probiotics. Do not eat large meals in late evening and consider raising head of bed.  

## 2016-04-19 DIAGNOSIS — H01001 Unspecified blepharitis right upper eyelid: Secondary | ICD-10-CM | POA: Diagnosis not present

## 2016-04-19 DIAGNOSIS — H01002 Unspecified blepharitis right lower eyelid: Secondary | ICD-10-CM | POA: Diagnosis not present

## 2016-04-19 DIAGNOSIS — H524 Presbyopia: Secondary | ICD-10-CM | POA: Diagnosis not present

## 2016-04-19 DIAGNOSIS — H52203 Unspecified astigmatism, bilateral: Secondary | ICD-10-CM | POA: Diagnosis not present

## 2016-04-19 DIAGNOSIS — H01005 Unspecified blepharitis left lower eyelid: Secondary | ICD-10-CM | POA: Diagnosis not present

## 2016-04-19 DIAGNOSIS — H01004 Unspecified blepharitis left upper eyelid: Secondary | ICD-10-CM | POA: Diagnosis not present

## 2016-04-19 DIAGNOSIS — H353131 Nonexudative age-related macular degeneration, bilateral, early dry stage: Secondary | ICD-10-CM | POA: Diagnosis not present

## 2016-04-24 DIAGNOSIS — M542 Cervicalgia: Secondary | ICD-10-CM | POA: Diagnosis not present

## 2016-04-24 DIAGNOSIS — G44309 Post-traumatic headache, unspecified, not intractable: Secondary | ICD-10-CM | POA: Diagnosis not present

## 2016-04-30 DIAGNOSIS — R251 Tremor, unspecified: Secondary | ICD-10-CM | POA: Diagnosis not present

## 2016-05-11 ENCOUNTER — Other Ambulatory Visit: Payer: Self-pay | Admitting: Family Medicine

## 2016-05-11 MED ORDER — METOPROLOL TARTRATE 25 MG PO TABS: 25.0000 mg | ORAL_TABLET | Freq: Two times a day (BID) | ORAL | 1 refills | Status: DC

## 2016-05-16 ENCOUNTER — Ambulatory Visit (INDEPENDENT_AMBULATORY_CARE_PROVIDER_SITE_OTHER): Payer: Medicare Other

## 2016-05-16 DIAGNOSIS — Z86718 Personal history of other venous thrombosis and embolism: Secondary | ICD-10-CM

## 2016-05-16 LAB — POCT INR: INR: 1.4

## 2016-05-16 NOTE — Progress Notes (Signed)
Pre visit review using our clinic tool,if applicable. No additional management support is needed unless otherwise documented below in the visit note.   Patient in for INR check. INR today is 1.4 Goal is 2.0-3.0  Patient denies diet or medication changes. States she has had no bleeding or bruising.  Per Dr. Charlett Blake patient to take Coumadin 5 mg daily except for Saturdays take 2.5 mg. Return for INR check in 2 weeks. Appointment scheduled for patient. Next appointment scheduled for 4/ 26/18.

## 2016-06-01 ENCOUNTER — Ambulatory Visit (INDEPENDENT_AMBULATORY_CARE_PROVIDER_SITE_OTHER): Payer: Medicare Other

## 2016-06-01 DIAGNOSIS — Z86718 Personal history of other venous thrombosis and embolism: Secondary | ICD-10-CM | POA: Diagnosis not present

## 2016-06-01 LAB — POCT INR: INR: 2.2

## 2016-06-01 NOTE — Progress Notes (Signed)
Pre visit review using our clinic tool,if applicable. No additional management support is needed unless otherwise documented below in the visit note.  Patient in for INR check today per order from Dr. Frederik Pear.  INR = 2.2 today. Goal is 2.0-3.0   Patient currently taking Coumadin 5 mg all days except Saturday and she takes 2.5 mg.  No missed doses, no changes in diet, no complaints of bleeding or bruising, no ABO usage.  Per Dr. Charlett Blake. Patient to continue taking Coumadin 5 mg on all days except Saturday take 2.5 mg. Return for INR check in 1 month. Appointment scheduled for Jun 29, 2016. Patient notified.

## 2016-06-02 ENCOUNTER — Other Ambulatory Visit: Payer: Self-pay | Admitting: Family Medicine

## 2016-06-02 ENCOUNTER — Telehealth: Payer: Self-pay | Admitting: *Deleted

## 2016-06-02 NOTE — Telephone Encounter (Signed)
90 day supply with 1 rf is OK

## 2016-06-02 NOTE — Telephone Encounter (Signed)
Faxed refill request received from [facility] for Gabapentin 800 mg Last filled by MD on 04/13/16, #[id number] Last AEX -04/13/16 Next AEX - 8-Mths Pharmacy request 90-day supply; Please Advise on refills/SLS 04/27

## 2016-06-05 MED ORDER — GABAPENTIN 800 MG PO TABS: ORAL_TABLET | ORAL | 1 refills | Status: DC

## 2016-06-05 NOTE — Telephone Encounter (Signed)
Rx request to pharmacy per provider's order/SLS 04/30

## 2016-06-29 ENCOUNTER — Ambulatory Visit (INDEPENDENT_AMBULATORY_CARE_PROVIDER_SITE_OTHER): Payer: Medicare Other | Admitting: Behavioral Health

## 2016-06-29 DIAGNOSIS — Z86718 Personal history of other venous thrombosis and embolism: Secondary | ICD-10-CM

## 2016-06-29 LAB — POCT INR: INR: 2.7

## 2016-06-29 NOTE — Progress Notes (Signed)
Pre visit review using our clinic review tool, if applicable. No additional management support is needed unless otherwise documented below in the visit note.  Patient came in clinic for INR check. She voiced no changes or positive findings. Patient reported adherence to medication & regimen. Today's INR reading was 2.7.  Advised patient to continue taking Coumadin 5 mg daily, except on Saturday take 2.5 mg. Return for INR check in 4 weeks.  She verbalized understanding. Next appointment scheduled for 07/27/16 at 10:45 AM.

## 2016-06-29 NOTE — Patient Instructions (Signed)
Continue taking Coumadin 5 mg daily, except on Saturday take 2.5 mg. Return for INR check in 4 weeks.

## 2016-07-27 ENCOUNTER — Ambulatory Visit (INDEPENDENT_AMBULATORY_CARE_PROVIDER_SITE_OTHER): Payer: Medicare Other | Admitting: Behavioral Health

## 2016-07-27 DIAGNOSIS — Z86718 Personal history of other venous thrombosis and embolism: Secondary | ICD-10-CM

## 2016-07-27 LAB — POCT INR: INR: 2.6

## 2016-07-27 NOTE — Patient Instructions (Signed)
Continue taking Coumadin 5 mg daily, except on Saturday take 2.5 mg. Return for INR check in 4 weeks.

## 2016-07-27 NOTE — Progress Notes (Signed)
Pre visit review using our clinic review tool, if applicable. No additional management support is needed unless otherwise documented below in the visit note.  Patient came in clinic for INR check. She reported no changes or positive findings. Patient adheres to medication & current regimen. Today's INR reading was 2.6.  Advised patient to continue taking Coumadin 5 mg daily, except on Saturday take 2.5 mg. Return for INR check in 4 weeks.  She verbalized understanding. Next appointment scheduled for 08/24/16 at 10:45 AM.

## 2016-08-04 ENCOUNTER — Other Ambulatory Visit: Payer: Self-pay | Admitting: Family Medicine

## 2016-08-15 ENCOUNTER — Encounter: Payer: Self-pay | Admitting: Podiatry

## 2016-08-15 ENCOUNTER — Ambulatory Visit (INDEPENDENT_AMBULATORY_CARE_PROVIDER_SITE_OTHER): Payer: Medicare Other | Admitting: Podiatry

## 2016-08-15 DIAGNOSIS — M2011 Hallux valgus (acquired), right foot: Secondary | ICD-10-CM

## 2016-08-15 DIAGNOSIS — M2041 Other hammer toe(s) (acquired), right foot: Secondary | ICD-10-CM | POA: Diagnosis not present

## 2016-08-15 NOTE — Progress Notes (Signed)
HPI The patient presents for followup of aortic stenosis and  atrial fibrillation.  She reports that she has had some dizziness. This seems to be when she's walking in a straight line. It's not with change in positions or turning her head. Seems to be mild and not reproducible with activities. She's had no presyncope or syncope. She's not limited by this. She's had some shortness of breath every now and then. However, she says this is not severe. She's not having any PND or orthopnea. She denies any chest pressure, neck or arm discomfort. She's waking or edema.    Allergies  Allergen Reactions  . Indomethacin Nausea And Vomiting    Caused chest pain.  . Codeine Nausea And Vomiting    Current Outpatient Prescriptions  Medication Sig Dispense Refill  . acetaminophen (TYLENOL) 500 MG tablet Take 1 tablet by mouth every 8 (eight) hours as needed for mild pain or headache.     . calcium citrate (CALCITRATE - DOSED IN MG ELEMENTAL CALCIUM) 950 MG tablet Take 200 mg of elemental calcium by mouth daily.    Marland Kitchen CARTIA XT 240 MG 24 hr capsule TAKE 1 CAPSULE BY MOUTH DAILY 90 capsule 0  . furosemide (LASIX) 20 MG tablet Daily and prn for weight gain>3# in 24 hours or increased edema 45 tablet 1  . gabapentin (NEURONTIN) 800 MG tablet TAKE 1 TABLET BY MOUTH 3 (THREE) TIMES DAILY 90 tablet 1  . hyoscyamine (LEVSIN SL) 0.125 MG SL tablet Place 1 tablet (0.125 mg total) under the tongue every 4 (four) hours as needed. 30 tablet 0  . metoprolol tartrate (LOPRESSOR) 25 MG tablet Take 1 tablet (25 mg total) by mouth 2 (two) times daily. 180 tablet 1  . Multiple Vitamin (MULTIVITAMIN) tablet Take 1 tablet by mouth daily.      . multivitamin-lutein (OCUVITE-LUTEIN) CAPS capsule Take 1 capsule by mouth daily.    . Omega 3 1000 MG CAPS Take 1,000 mg by mouth daily. Reported on 03/24/2015    . omeprazole (PRILOSEC) 40 MG capsule Take 40 mg by mouth daily.     Mikayla [name] Glycol-Propyl Glycol (SYSTANE OP) Apply 1  drop to eye 2 (two) times daily as needed. For dry eyes    . Probiotic Product (PHILLIPS COLON HEALTH PO) Take 1 capsule by mouth daily.    . traMADol (ULTRAM) 50 MG tablet Take 1 tablet (50 mg total) by mouth every 8 (eight) hours as needed. 40 tablet 0  . warfarin (COUMADIN) 5 MG tablet Take daily as directed by coumadin clinic. 90 tablet 0   No current facility-administered medications for this visit.     Past Medical History:  Diagnosis Date  . Abdominal pain 08/25/2014  . Acute venous embolism and thrombosis of unspecified deep vessels of lower extremity    after broken foot screw left in place, left leg  . Anemia   . Aortic stenosis    echo 11/11: EF 60%, mild LVH, mod/severe AS with mean 32 mmHg, peak 55 mmHg, mild AI, mild MR, (previous echo 2010 with mean gradient 29 mmHg)  . Atrial tachycardia ([place])   . Bilateral dry eyes   . Brain aneurysm 01/16/2015  . Carotid stenosis    dopplers 11/11: 0-39% bilat  . Dehydration 08/25/2014  . Dermatitis contact, eyelid 04/16/2016  . Dyslipidemia   . GERD (gastroesophageal reflux disease)   . H/O measles   . History of chicken pox   . Hyperlipidemia   . Hyperlipidemia, mild 10/08/2014  .  Osteopenia 10/08/2014  . Other congenital anomaly of toes   . Pain in joint, pelvic region and thigh   . Palpitations    PACs and atrial tachy  . Peripheral edema 08/25/2014    Past Surgical History:  Procedure Laterality Date  . CHOLECYSTECTOMY    . FOOT SURGERY    . hyroglossal duct cyst resection    . LEFT AND RIGHT HEART CATHETERIZATION WITH CORONARY ANGIOGRAM N/A 03/21/2013   Procedure: LEFT AND RIGHT HEART CATHETERIZATION WITH CORONARY ANGIOGRAM;  Surgeon: Minus Breeding, MD;  Location: [facility] CATH LAB;  Service: Cardiovascular;  Laterality: N/A;  . left hip replacement    . RADICAL HYSTERECTOMY    . TONSILLECTOMY      ROS:   As stated in the HPI and negative for all other systems.  PHYSICAL EXAM BP 130/70   Pulse (!) 56   Ht 5\' 5"  (1.651  m)   Wt 174 lb (78.9 kg)   BMI 28.96 kg/m   GENERAL:  Well appearing NECK:  No jugular venous distention, waveform within normal limits, carotid upstroke brisk and symmetric, no bruits, no thyromegaly LUNGS:  Clear to auscultation bilaterally CHEST:  Unremarkable HEART:  PMI not displaced or sustained,S1 and S2 within normal limits, no S3, no S4, no clicks, no rubs, 3 out of 6 apical mid to late peaking systolic murmur radiating out the aortic outflow tract, no diastolic murmurs ABD:  Flat, positive bowel sounds normal in frequency in pitch, no bruits, no rebound, no guarding, no midline pulsatile mass, no hepatomegaly, no splenomegaly EXT:  2 plus pulses throughout, no edema, no cyanosis no clubbing   EKG:  Sinus rhythm, rate 57, axis within normal limits, intervals within normal limits, no acute ST-T wave changes.  08/16/2016    ASSESSMENT AND PLAN  Aortic stenosis -  She does have some vague symptoms which could be related to her aortic stenosis. Her stenosis was severe on echo in 2015 but only moderate on catheterization. I will repeat an echocardiogram. We discussed indications for surgery. It might be somewhat hard to assess her clinically as she has vague symptoms plus she walks with a cane also has limited mobility.   Atrial fibrillation-   [name] has ith a risk of stroke of 3.21%a CHA2DS2 - VASc score of 3.  She's not had any symptomatic tachypalpitations. No change in therapy is indicated.   Carotid stenosis - This was mild several years ago. I like to repeat a carotid Doppler.

## 2016-08-15 NOTE — Patient Instructions (Signed)
Bunion A bunion is a bump on the base of the big toe that forms when the bones of the big toe joint move out of position. Bunions may be small at first, but they often get larger over time. The can make walking painful. What are the causes? A bunion may be caused by:  Wearing narrow or pointed shoes that force the big toe to press against the other toes.  Abnormal foot development that causes the foot to roll inward (pronate).  Changes in the foot that are caused by certain diseases, such as rheumatoid arthritis and polio.  A foot injury.  What increases the risk? The following factors may make you more likely to develop this condition:  Wearing shoes that squeeze the toes together.  Having certain diseases, such as: ? Rheumatoid arthritis. ? Polio. ? Cerebral palsy.  Having family members who have bunions.  Being born with a foot deformity, such as flat feet or low arches.  Doing activities that put a lot of pressure on the feet, such as ballet dancing.  What are the signs or symptoms? The main symptom of a bunion is a noticeable bump on the big toe. Other symptoms may include:  Pain.  Swelling around the big toe.  Redness and inflammation.  Thick or hardened skin on the big toe or between the toes.  Stiffness or loss of motion in the big toe.  Trouble with walking.  How is this diagnosed? A bunion may be diagnosed based on your symptoms, medical history, and activities. You may have tests, such as:  X-rays. These allow your health care provider to check the position of the bones in your foot and look for damage to your joint. They also help your health care provider to determine the severity of your bunion and the best way to treat it.  Joint aspiration. In this test, a sample of fluid is removed from the toe joint. This test, which may be done if you are in a lot of pain, helps to rule out diseases that cause painful swelling of the joints, such as  arthritis.  How is this treated? There is no cure for a bunion, but treatment can help to prevent a bunion from getting worse. Treatment depends on the severity of your symptoms. Your health care provider may recommend:  Wearing shoes that have a wide toe box.  Using bunion pads to cushion the affected area.  Taping your toes together to keep them in a normal position.  Placing a device inside your shoe (orthotics) to help reduce pressure on your toe joint.  Taking medicine to ease pain, inflammation, and swelling.  Applying heat or ice to the affected area.  Doing stretching exercises.  Surgery to remove scar tissue and move the toes back into their normal position. This treatment is rare.  Follow these instructions at home:  Support your toe joint with proper footwear, shoe padding, or taping as told by your health care provider.  Take over-the-counter and prescription medicines only as told by your health care provider.  If directed, apply ice to the injured area: ? Put ice in a plastic bag. ? Place a towel between your skin and the bag. ? Leave the ice on for 20 minutes, 2-3 times per day.  If directed, apply heat to the affected area before you exercise. Use the heat source that your health care provider recommends, such as a moist heat pack or a heating pad. ? Place a towel between your   skin and the heat source. ? Leave the heat on for 20-30 minutes. ? Remove the heat if your skin turns bright red. This is especially important if you are unable to feel pain, heat, or cold. You may have a greater risk of getting burned.  Do exercises as told by your health care provider.  Keep all follow-up visits as told by your health care provider. Contact a health care provider if:  Your symptoms get worse.  Your symptoms do not improve in 2 weeks. Get help right away if:  You have severe pain and trouble with walking. This information is not intended to replace advice given  to you by your health care provider. Make sure you discuss any questions you have with your health care provider. Document Released: 01/23/2005 Document Revised: 07/01/2015 Document Reviewed: 08/23/2014 Elsevier Interactive Patient Education  2018 [employer]   Hammer Toe Hammer toe is a change in the shape (a deformity) of your second, third, or fourth toe. The deformity causes the middle joint of your toe to stay bent. This causes pain, especially when you are wearing shoes. Hammer toe starts gradually. At first, the toe can be straightened. Gradually over time, the deformity becomes stiff and permanent. Early treatments to keep the toe straight may relieve pain. As the deformity becomes stiff and permanent, surgery may be needed to straighten the toe. What are the causes? Hammer toe is caused by abnormal bending of the toe joint that is closest to your foot. It happens gradually over time. This pulls on the muscles and connections (tendons) of the toe joint, making them weak and stiff. It is often related to wearing shoes that are too short or narrow and do not let your toes straighten. What increases the risk? You may be at greater risk for hammer toe if you:  Are female.  Are older.  Wear shoes that are too small.  Wear high-heeled shoes that pinch your toes.  Are a ballet dancer.  Have a second toe that is longer than your big toe (first toe).  Injure your foot or toe.  Have arthritis.  Have a family history of hammer toe.  Have a nerve or muscle disorder.  What are the signs or symptoms? The main symptoms of this condition are pain and deformity of the toe. The pain is worse when wearing shoes, walking, or running. Other symptoms may include:  Corns or calluses over the bent part of the toe or between the toes.  Redness and a burning feeling on the toe.  An open sore that forms on the top of the toe.  Not being able to straighten the toe.  How is this  diagnosed? This condition is diagnosed based on your symptoms and a physical exam. During the exam, your health care provider will try to straighten your toe to see how stiff the deformity is. You may also have tests, such as:  A blood test to check for rheumatoid arthritis.  An X-ray to show how severe the deformity is.  How is this treated? Treatment for this condition will depend on how stiff the deformity is. Surgery is often needed. However, sometimes a hammer toe can be straightened without surgery. Treatments that do not involve surgery include:  Taping the toe into a straightened position.  Using pads and cushions to protect the toe (orthotics).  Wearing shoes that provide enough room for the toes.  Doing toe-stretching exercises at home.  Taking an NSAID to reduce pain and   swelling.  If these treatments do not help or the toe cannot be straightened, surgery is the next option. The most common surgeries used to straighten a hammer toe include:  Arthroplasty. In this procedure, part of the joint is removed, and that allows the toe to straighten.  Fusion. In this procedure, cartilage between the two bones of the joint is taken out and the bones are fused together into one longer bone.  Implantation. In this procedure, part of the bone is removed and replaced with an implant to let the toe move again.  Flexor tendon transfer. In this procedure, the tendons that curl the toes down (flexor tendons) are repositioned.  Follow these instructions at home:  Take over-the-counter and prescription medicines only as told by your health care provider.  Do toe straightening and stretching exercises as told by your health care provider.  Keep all follow-up visits as told by your health care provider. This is important. How is this prevented?  Wear shoes that give your toes enough room and do not cause pain.  Do not wear high-heeled shoes. Contact a health care provider if:  Your  pain gets worse.  Your toe becomes red or swollen.  You develop an open sore on your toe. This information is not intended to replace advice given to you by your health care provider. Make sure you discuss any questions you have with your health care provider. Document Released: 01/21/2000 Document Revised: 08/13/2015 Document Reviewed: 05/19/2015 Elsevier Interactive Patient Education  2018 [employer]   

## 2016-08-16 ENCOUNTER — Encounter: Payer: Self-pay | Admitting: Cardiology

## 2016-08-16 ENCOUNTER — Ambulatory Visit (INDEPENDENT_AMBULATORY_CARE_PROVIDER_SITE_OTHER): Payer: Medicare Other | Admitting: Cardiology

## 2016-08-16 VITALS — BP 130/70 | HR 56 | Ht 65.0 in | Wt 174.0 lb

## 2016-08-16 DIAGNOSIS — I480 Paroxysmal atrial fibrillation: Secondary | ICD-10-CM

## 2016-08-16 DIAGNOSIS — I350 Nonrheumatic aortic (valve) stenosis: Secondary | ICD-10-CM | POA: Diagnosis not present

## 2016-08-16 NOTE — Patient Instructions (Signed)
Medication Instructions:  The current medical regimen is effective;  continue present plan and medications.  Testing/Procedures: Your physician has requested that you have an echocardiogram. Echocardiography is a painless test that uses sound waves to create images of your heart. It provides your doctor with information about the size and shape of your heart and how well your heart's chambers and valves are working. This procedure takes approximately one hour. There are no restrictions for this procedure.  Follow-Up: Follow up in 1 year with Dr. Hochrein.  You will receive a letter in the mail 2 months before you are due.  Please call us when you receive this letter to schedule your follow up appointment.  If you need a refill on your cardiac medications before your next appointment, please call your pharmacy.  Thank you for choosing [facility]!!     

## 2016-08-17 DIAGNOSIS — M2011 Hallux valgus (acquired), right foot: Secondary | ICD-10-CM | POA: Insufficient documentation

## 2016-08-17 DIAGNOSIS — M2041 Other hammer toe(s) (acquired), right foot: Secondary | ICD-10-CM | POA: Insufficient documentation

## 2016-08-17 NOTE — Progress Notes (Signed)
Subjective:    Patient ID: Mikayla [name], female   DOB: 76 y.o.   MRN: [id number]   HPI 76 year old female presents the office today for concerns her right foot pain. She states that she has a bunion as well as hammertoes on the right foot which are becoming painful mostly with shoe gear and pressure. She denies any recent injury or trauma she's had no significant treatment. It has been ongoing for several years has been gradually worsening. She denies any numbness or tingling. She states the only shoe that she can wear is an Radiographer, therapeutic. She has no other concerns today.   Review of Systems  All other systems reviewed and are negative.       Objective:  Physical Exam General: AAO x3, NAD  Dermatological: Skin is warm, dry and supple bilateral. Nails x 10 are well manicured; remaining integument appears unremarkable at this time. There are no open sores, no preulcerative lesions, no rash or signs of infection present.  Vascular: Dorsalis Pedis artery and Posterior Tibial artery pedal pulses are 2/4 bilateral with immedate capillary fill time.  There is no pain with calf compression, swelling, warmth, erythema.   Neruologic: Grossly intact via light touch bilateral. Vibratory intact via tuning fork bilateral. Protective threshold with Semmes Wienstein monofilament intact to all pedal sites bilateral.   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 There is no first ray hypermobility present. Hammertoe contractures are present lesser digits and there is overlapping third and fourth digits. There is tenderness on the bunion as well as hammertoes sites. No other area of tenderness at this time. Muscular strength 5/5 in all groups tested bilateral.  Gait: Unassisted, Nonantalgic.      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 Conservatively we discussed shoe gear modifications which she's been doing as well as different offloading and padding devices. I dispensed the devices to her today for her to try to see if they work. We did briefly discuss surgery including bunion correction as well as hammertoe repair possible shortening osteotomy metatarsals however we'll continue with conservative treatment for now. If symptoms continue surgical intervention may further be discussed. She would like to try nonsurgical options and I agree with this.  Celesta Gentile, DPM

## 2016-08-18 ENCOUNTER — Telehealth: Payer: Self-pay | Admitting: Podiatry

## 2016-08-18 NOTE — Telephone Encounter (Signed)
I saw Dr. Jacqualyn Posey for the first time on Tuesday and I was given orthotics to wear on right foot. I didn't know weather or not to leave the plastic device on and the ring around my toe, take it off, I didn't know what to do. I don't want to hurt my foot or make it worse.

## 2016-08-21 NOTE — Telephone Encounter (Signed)
Pt states she is unsure of the use of the devices given by Dr. Jacqualyn Posey. I told pt the devices were giving to lift hammer toe so it would not hit the sole of the shoe when walking and the other sleeves were to keep the toes from rubbing each other or the shoe. Pt states she has a blister and redness under the toes with flaking per her dtr, who helps her put them on due to pt's back problem. I offered pt an appt to discuss devices and use, and have the blister and redness evaluated. Transferred pt to Schedulers.

## 2016-08-21 NOTE — Telephone Encounter (Signed)
Pt has questions about devices Dr. Jacqualyn Posey gave her last week.

## 2016-08-22 ENCOUNTER — Telehealth: Payer: Self-pay | Admitting: Podiatry

## 2016-08-22 NOTE — Telephone Encounter (Signed)
Unable to leave vm.

## 2016-08-23 DIAGNOSIS — Z961 Presence of intraocular lens: Secondary | ICD-10-CM | POA: Diagnosis not present

## 2016-08-23 DIAGNOSIS — H01001 Unspecified blepharitis right upper eyelid: Secondary | ICD-10-CM | POA: Diagnosis not present

## 2016-08-23 DIAGNOSIS — H52203 Unspecified astigmatism, bilateral: Secondary | ICD-10-CM | POA: Diagnosis not present

## 2016-08-23 DIAGNOSIS — H35363 Drusen (degenerative) of macula, bilateral: Secondary | ICD-10-CM | POA: Diagnosis not present

## 2016-08-23 DIAGNOSIS — H353131 Nonexudative age-related macular degeneration, bilateral, early dry stage: Secondary | ICD-10-CM | POA: Diagnosis not present

## 2016-08-23 DIAGNOSIS — H524 Presbyopia: Secondary | ICD-10-CM | POA: Diagnosis not present

## 2016-08-23 DIAGNOSIS — H01002 Unspecified blepharitis right lower eyelid: Secondary | ICD-10-CM | POA: Diagnosis not present

## 2016-08-23 DIAGNOSIS — H01005 Unspecified blepharitis left lower eyelid: Secondary | ICD-10-CM | POA: Diagnosis not present

## 2016-08-23 DIAGNOSIS — H47093 Other disorders of optic nerve, not elsewhere classified, bilateral: Secondary | ICD-10-CM | POA: Diagnosis not present

## 2016-08-23 DIAGNOSIS — H01004 Unspecified blepharitis left upper eyelid: Secondary | ICD-10-CM | POA: Diagnosis not present

## 2016-08-24 ENCOUNTER — Ambulatory Visit (INDEPENDENT_AMBULATORY_CARE_PROVIDER_SITE_OTHER): Payer: Medicare Other | Admitting: Behavioral Health

## 2016-08-24 ENCOUNTER — Other Ambulatory Visit: Payer: Self-pay | Admitting: Family Medicine

## 2016-08-24 DIAGNOSIS — Z86718 Personal history of other venous thrombosis and embolism: Secondary | ICD-10-CM

## 2016-08-24 LAB — POCT INR: INR: 2.5

## 2016-08-24 NOTE — Progress Notes (Signed)
Pre visit review using our clinic review tool, if applicable. No additional management support is needed unless otherwise documented below in the visit note.  Patient came in clinic for INR check. She voices adherence to medication & current regimen. Patient reports no changes; all findings were negative. Today's INR reading is 2.5.  Advised patient to continue taking Coumadin 5 mg daily, except on Saturday take 2.5 mg. Return for INR check in 4 weeks. She verbalized understanding. Next appointment 09/21/16 at 10:45 AM.

## 2016-08-24 NOTE — Patient Instructions (Signed)
Continue taking Coumadin 5 mg daily, except on Saturday take 2.5 mg. Return for INR check in 4 weeks.

## 2016-08-29 ENCOUNTER — Other Ambulatory Visit (HOSPITAL_COMMUNITY): Payer: Medicare Other

## 2016-08-30 ENCOUNTER — Other Ambulatory Visit: Payer: Self-pay

## 2016-08-30 ENCOUNTER — Ambulatory Visit (HOSPITAL_COMMUNITY): Payer: Medicare Other | Attending: Internal Medicine

## 2016-08-30 DIAGNOSIS — I350 Nonrheumatic aortic (valve) stenosis: Secondary | ICD-10-CM | POA: Diagnosis not present

## 2016-08-30 DIAGNOSIS — I082 Rheumatic disorders of both aortic and tricuspid valves: Secondary | ICD-10-CM | POA: Diagnosis not present

## 2016-09-07 ENCOUNTER — Telehealth: Payer: Self-pay | Admitting: Family Medicine

## 2016-09-07 NOTE — Telephone Encounter (Signed)
Called pt to schedule AWV on same day as yearly visit (12/14/16). Pt did not answer will try to call pt back at a later time. Also visit type needs to be changed to OV; pt has Medicare Part A & B, does not cover CPE, changes will be made once pt confirms. SF

## 2016-09-11 ENCOUNTER — Other Ambulatory Visit: Payer: Self-pay | Admitting: Family Medicine

## 2016-09-18 DIAGNOSIS — F0781 Postconcussional syndrome: Secondary | ICD-10-CM | POA: Diagnosis not present

## 2016-09-18 DIAGNOSIS — M542 Cervicalgia: Secondary | ICD-10-CM | POA: Diagnosis not present

## 2016-09-18 DIAGNOSIS — G44309 Post-traumatic headache, unspecified, not intractable: Secondary | ICD-10-CM | POA: Diagnosis not present

## 2016-09-21 ENCOUNTER — Ambulatory Visit (INDEPENDENT_AMBULATORY_CARE_PROVIDER_SITE_OTHER): Payer: Medicare Other | Admitting: Behavioral Health

## 2016-09-21 DIAGNOSIS — Z86718 Personal history of other venous thrombosis and embolism: Secondary | ICD-10-CM | POA: Diagnosis not present

## 2016-09-21 LAB — POCT INR: INR: 2.6

## 2016-09-21 NOTE — Progress Notes (Signed)
Pre visit review using our clinic review tool, if applicable. No additional management support is needed unless otherwise documented below in the visit note.  Patient came in office for INR check. She voiced no changes or positive findings. Patient adheres to medication & current regimen. INR reading during today's visit was 2.6.  Advised patient to continue taking Coumadin 5 mg daily, except on Saturday take 2.5 mg. Return for INR check in 4 weeks.  She verbalized understanding. Next appointment 10/19/16 at 10:00 AM.

## 2016-09-21 NOTE — Patient Instructions (Signed)
Continue taking Coumadin 5 mg daily, except on Saturday take 2.5 mg. Return for INR check in 4 weeks.

## 2016-10-13 ENCOUNTER — Other Ambulatory Visit: Payer: Self-pay | Admitting: Family Medicine

## 2016-10-17 ENCOUNTER — Ambulatory Visit (INDEPENDENT_AMBULATORY_CARE_PROVIDER_SITE_OTHER): Payer: Medicare Other | Admitting: Podiatry

## 2016-10-17 ENCOUNTER — Ambulatory Visit: Payer: Medicare Other | Admitting: Podiatry

## 2016-10-17 ENCOUNTER — Encounter: Payer: Self-pay | Admitting: Podiatry

## 2016-10-17 ENCOUNTER — Ambulatory Visit (INDEPENDENT_AMBULATORY_CARE_PROVIDER_SITE_OTHER): Payer: Medicare Other

## 2016-10-17 DIAGNOSIS — Z86718 Personal history of other venous thrombosis and embolism: Secondary | ICD-10-CM

## 2016-10-17 DIAGNOSIS — M2041 Other hammer toe(s) (acquired), right foot: Secondary | ICD-10-CM | POA: Diagnosis not present

## 2016-10-17 DIAGNOSIS — M2011 Hallux valgus (acquired), right foot: Secondary | ICD-10-CM

## 2016-10-17 LAB — POCT INR: INR: 2

## 2016-10-17 NOTE — Progress Notes (Addendum)
Pre visit review using our clinic tool,if applicable. No additional management support is needed unless otherwise documented below in the visit note.   Patient in for INR check per order from Dr. Charlett Blake dated 09/21/2016.  No complaints voiced this visit. Patient has not missed any doses of medication, had any bruising or bleeding. No increase in green leafy vegetables.  INR today = 2.0 Goal = 2.0-3.0  Per Dr. Charlett Blake patient to continue taking Warfarin 5mg  daily exceptr on Saturdays take 2.5 mg.   Patient may have INR check on next OV with provider in October. Patient notified and agreed.  Nurse INR note reviewed. Agree with documention and plan.

## 2016-10-17 NOTE — Progress Notes (Signed)
Subjective: Mikayla [name] presents the office they for follow-up evaluation of right foot pain. She states that the only issue that she is having is that her fourth toes, underneath the third toe. She's been using a toe separator for this which is helping. She does have a significant bunion as well does not cause any pain at this point. She has not been using the toe crosses that was uncomfortable for her. She has no new concerns. Denies any systemic complaints such as fevers, chills, nausea, vomiting. No acute changes since last appointment, and no other complaints at this time.   Objective: AAO x3, NAD DP/PT pulses palpable bilaterally, CRT less than 3 seconds On the right foot there are severe HAV present as well as hammertoe contractures. The fourth digit is sitting underneath the third toe. There is no skin breakdown, corns, calluses or wounds present. This causing pain mostly with pressure and site shoes. There is no swelling or redness to her feet.  No open lesions or pre-ulcerative lesions.  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  -At this point discussed conservative as well as surgical treatment. She was told off any surgical intervention if able. I discussed the change in shoes as well as further offloading pads. I dispensed further offloading pads for her to try and see what works best for her. Months for any skin breakdown or signs of infection -Follow-up as needed. -Patient encouraged to call the office with any questions, concerns, change in symptoms.   Celesta Gentile, DPM

## 2016-10-19 ENCOUNTER — Ambulatory Visit: Payer: Medicare Other

## 2016-11-09 ENCOUNTER — Other Ambulatory Visit: Payer: Self-pay | Admitting: Family Medicine

## 2016-11-10 ENCOUNTER — Other Ambulatory Visit: Payer: Self-pay | Admitting: Family Medicine

## 2016-11-11 ENCOUNTER — Other Ambulatory Visit: Payer: Self-pay | Admitting: Family Medicine

## 2016-11-16 ENCOUNTER — Ambulatory Visit: Payer: Medicare Other

## 2016-11-17 ENCOUNTER — Ambulatory Visit (INDEPENDENT_AMBULATORY_CARE_PROVIDER_SITE_OTHER): Payer: Medicare Other

## 2016-11-17 DIAGNOSIS — Z23 Encounter for immunization: Secondary | ICD-10-CM | POA: Diagnosis not present

## 2016-11-17 DIAGNOSIS — Z86718 Personal history of other venous thrombosis and embolism: Secondary | ICD-10-CM | POA: Diagnosis not present

## 2016-11-17 LAB — POCT INR: INR: 3

## 2016-11-17 NOTE — Progress Notes (Signed)
Pre visit review using our clinic tool,if applicable. No additional management support is needed unless otherwise documented below in the visit note.   Patient in for INR check per order from Dr. Frederik Pear.   No com[plaints voiced today. Patient taking Warfarin  5 mg all days except for Saturday taking 2.5 mg.  INR today = 3.0  Patient goal = 2.0-3.0  Per Dr. Nani Ravens patient to continue taking Warfarin As she is taking it and return for INR re check in 2 weeks.  Appointment scheduled for patient. Patient requested flu shot.  Given 0.5 ml High Dose flu immunization. Patient tolerated well.

## 2016-11-27 ENCOUNTER — Ambulatory Visit (INDEPENDENT_AMBULATORY_CARE_PROVIDER_SITE_OTHER): Payer: Medicare Other | Admitting: Medical

## 2016-11-27 ENCOUNTER — Encounter: Payer: Self-pay | Admitting: Medical

## 2016-11-27 VITALS — BP 155/63 | HR 55 | Temp 97.9°F | Resp 16 | Ht 65.0 in | Wt 175.0 lb

## 2016-11-27 DIAGNOSIS — J029 Acute pharyngitis, unspecified: Secondary | ICD-10-CM

## 2016-11-27 DIAGNOSIS — Z86718 Personal history of other venous thrombosis and embolism: Secondary | ICD-10-CM

## 2016-11-27 DIAGNOSIS — K140 Glossitis: Secondary | ICD-10-CM | POA: Diagnosis not present

## 2016-11-27 LAB — CBC WITH DIFFERENTIAL/PLATELET
Basophils Absolute: 0.1 10*3/uL (ref 0.0–0.1)
Basophils Relative: 1 % (ref 0.0–3.0)
EOS ABS: 0.1 10*3/uL (ref 0.0–0.7)
EOS PCT: 1.7 % (ref 0.0–5.0)
HEMATOCRIT: 41 % (ref 36.0–46.0)
HEMOGLOBIN: 13.4 g/dL (ref 12.0–15.0)
LYMPHS PCT: 18.5 % (ref 12.0–46.0)
Lymphs Abs: 1.3 10*3/uL (ref 0.7–4.0)
MCHC: 32.7 g/dL (ref 30.0–36.0)
MCV: 93.2 fl (ref 78.0–100.0)
Monocytes Absolute: 0.5 10*3/uL (ref 0.1–1.0)
Monocytes Relative: 7 % (ref 3.0–12.0)
Neutro Abs: 5 10*3/uL (ref 1.4–7.7)
Neutrophils Relative %: 71.8 % (ref 43.0–77.0)
Platelets: 246 10*3/uL (ref 150.0–400.0)
RBC: 4.4 Mil/uL (ref 3.87–5.11)
RDW: 14.4 % (ref 11.5–15.5)
WBC: 7 10*3/uL (ref 4.0–10.5)

## 2016-11-27 LAB — B12 AND FOLATE PANEL: VITAMIN B 12: 580 pg/mL (ref 211–911)

## 2016-11-27 LAB — POCT INR: INR: 2.1

## 2016-11-27 LAB — FOLATE: Folate: >23.9 ng/mL (ref >5.9)

## 2016-11-27 MED ORDER — MAGIC MOUTHWASH: ORAL | 0 refills | Status: DC

## 2016-11-27 MED FILL — MAGIC MW LID/MAAL/DP1:1:1: 2 | 10 days supply | Qty: 200 | Fill #0

## 2016-11-27 NOTE — Patient Instructions (Signed)
For your appearance of glossitis we will get cbc, folic acid, K38 and b1.  Magic mouthwash rx. Use swish and spit.  Rapid strep test was negative and sending out strep test.   Your inr was 3.1. I want you to take coumadin 2.5 mg tomorrow and Wednesday. Then resume your regular regimen. Then will repeat inr on your follow up.  Follow up as regularly scheduled or as needed

## 2016-11-27 NOTE — Progress Notes (Signed)
Subjective:    Patient ID: Mikayla [name], female    DOB: 11-21-1940, 76 y.o.   MRN: [id number]  HPI  Pt in for some pain since around November 16, 2016 she had mild sore throat. She thinks maybe throat feels dry. No fever, no chills, no sweats or bodyaches.  Also pt states tongue feels a little raw and has mild cracked appearance. Little more tender tongue with spicy food or salty foods.   She thinks lips inside have inflamed appearance. Pt not on any ace inhibitors Pt states upper lip feels little more swollen.  Pt has dentures and had for a while.    Review of Systems  Constitutional: Negative for chills, fatigue and fever.  HENT: Positive for sore throat. Negative for congestion, ear pain, postnasal drip, rhinorrhea, sinus pain, sinus pressure, sneezing, trouble swallowing and voice change.        See hpi.  Respiratory: Negative for cough, choking, shortness of breath and wheezing.   Cardiovascular: Negative for chest pain and palpitations.  Gastrointestinal: Negative for abdominal pain.  Musculoskeletal: Negative for back pain and myalgias.  Neurological: Negative for dizziness and light-headedness.  Hematological: Negative for adenopathy. Does not bruise/bleed easily.  Psychiatric/Behavioral: Negative for behavioral problems, decreased concentration and dysphoric mood.   Past Medical History:  Diagnosis Date  . Abdominal pain 08/25/2014  . Acute venous embolism and thrombosis of unspecified deep vessels of lower extremity    after broken foot screw left in place, left leg  . Anemia   . Aortic stenosis    echo 11/11: EF 60%, mild LVH, mod/severe AS with mean 32 mmHg, peak 55 mmHg, mild AI, mild MR, (previous echo 2010 with mean gradient 29 mmHg)  . Atrial tachycardia ([place])   . Bilateral dry eyes   . Brain aneurysm 01/16/2015  . Carotid stenosis    dopplers 11/11: 0-39% bilat  . Dehydration 08/25/2014  . Dermatitis contact, eyelid 04/16/2016  . Dyslipidemia   . GERD  (gastroesophageal reflux disease)   . H/O measles   . History of chicken pox   . Hyperlipidemia   . Hyperlipidemia, mild 10/08/2014  . Osteopenia 10/08/2014  . Other congenital anomaly of toes   . Pain in joint, pelvic region and thigh   . Palpitations    PACs and atrial tachy  . Peripheral edema 08/25/2014     Social History   Social History  . Marital status: Divorced    Spouse name: N/A  . Number of children: N/A  . Years of education: N/A   Occupational History  . Not on file.   Social History Main Topics  . Smoking status: Never Smoker  . Smokeless tobacco: Never Used  . Alcohol use No  . Drug use: No  . 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 Not on file   Social History Narrative  . No narrative on file    Past Surgical History:  Procedure Laterality Date  . CHOLECYSTECTOMY    . FOOT SURGERY    . hyroglossal duct cyst resection    . LEFT AND RIGHT HEART CATHETERIZATION WITH CORONARY ANGIOGRAM N/A 03/21/2013   Procedure: LEFT AND RIGHT HEART CATHETERIZATION WITH CORONARY ANGIOGRAM;  Surgeon: Minus Breeding, MD;  Location: [facility] C[facility] LAB;  Service: Cardiovascular;  Laterality: N/A;  . left hip replacement    . RADICAL HYSTERECTOMY    . TONSILLECTOMY  Family History  Problem Relation Age of Onset  . Heart disease Mother   . Hypertension Mother 48  . Heart disease Father 57  . Cancer Maternal Aunt        Breast  . Heart disease Maternal Grandmother   . Heart disease Maternal Grandfather   . Hernia Daughter   . Gallstones Daughter     Allergies  Allergen Reactions  . Indomethacin Nausea And Vomiting    Caused chest pain.  . Codeine Nausea And Vomiting    Current Outpatient Prescriptions on File Prior to Visit  Medication Sig Dispense Refill  . acetaminophen (TYLENOL) 500 MG tablet Take 1 tablet by mouth every 8 (eight) hours as needed for mild pain or headache.       . calcium citrate (CALCITRATE - DOSED IN MG ELEMENTAL CALCIUM) 950 MG tablet Take 200 mg of elemental calcium by mouth daily.    Marland Kitchen CARTIA XT 240 MG 24 hr capsule TAKE 1 CAPSULE BY MOUTH DAILY 90 capsule 0  . furosemide (LASIX) 20 MG tablet Daily and prn for weight gain>3# in 24 hours or increased edema 45 tablet 1  . gabapentin (NEURONTIN) 800 MG tablet TAKE 1 TABLET BY MOUTH THREE TIMES A DAY 90 tablet 1  . hyoscyamine (LEVSIN SL) 0.125 MG SL tablet Place 1 tablet (0.125 mg total) under the tongue every 4 (four) hours as needed. 30 tablet 0  . metoprolol tartrate (LOPRESSOR) 25 MG tablet TAKE 1 TABLET BY MOUTH TWO (2) TIMES DAILY 180 tablet 0  . Multiple Vitamin (MULTIVITAMIN) tablet Take 1 tablet by mouth daily.      . multivitamin-lutein (OCUVITE-LUTEIN) CAPS capsule Take 1 capsule by mouth daily.    . Omega 3 1000 MG CAPS Take 1,000 mg by mouth daily. Reported on 03/24/2015    . omeprazole (PRILOSEC) 40 MG capsule TAKE ONE CAPSULE BY MOUTH ONCE DAILY 90 capsule 5  . Polyethyl Glycol-Propyl Glycol (SYSTANE OP) Apply 1 drop to eye 2 (two) times daily as needed. For dry eyes    . Probiotic Product (PHILLIPS COLON HEALTH PO) Take 1 capsule by mouth daily.    . traMADol (ULTRAM) 50 MG tablet Take 1 tablet (50 mg total) by mouth every 8 (eight) hours as needed. 40 tablet 0  . warfarin (COUMADIN) 5 MG tablet Take daily as directed by coumadin clinic. 90 tablet 0   No current facility-administered medications on file prior to visit.     BP (!) 155/63   Pulse (!) 55   Temp 97.9 F (36.6 C) (Oral)   Resp 16   Ht 5\' 5"  (1.651 m)   Wt 175 lb (79.4 kg)   SpO2 100%   BMI 29.12 kg/m       Objective:   Physical Exam  General  Mental Status - Alert. General Appearance - Well groomed. Not in acute distress.  Skin Rashes- No Rashes.  HEENT Head- Normal. Ear Auditory Canal - Left- Normal. Right - Normal.Tympanic Membrane- Left- Normal. Right- Normal. Eye Sclera/Conjunctiva- Left-  Normal. Right- Normal. Nose & Sinuses Nasal Mucosa- Left-  No t Boggy and Congested. Right- not   Boggy and  Congested.Bilatera no l maxillary and  No frontal sinus pressure. Mouth & Throat Lips: Upper Lip- Normal: no dryness, cracking, pallor, cyanosis, or vesicular eruption. Lower Lip-Normal: no dryness, cracking, pallor, cyanosis or vesicular eruption. Buccal Mucosa- Bilateral- No Aphthous ulcers.(no obvious thrush seen) Oropharynx- No Discharge or Erythema.(mild beefy red appearance to tongue. Some parts smooth appearance but  mild center mild cracked appearance. Tonsils: Characteristics- Bilateral- faint Erythema but no  Congestion. Size/Enlargement- Bilateral- No enlargement. Discharge- bilateral-None. Lips-no obvious swelling(first time I have seen her. Thin appearance to lips.   Neck Neck- Supple. No Masses.   Chest and Lung Exam Auscultation: Breath Sounds:-Clear even and unlabored.  Cardiovascular Auscultation:Rythm- Regular, rate and rhythm. Murmurs & Other Heart Sounds:Ausculatation of the heart reveal- No Murmurs.  Lymphatic Head & Neck General Head & Neck Lymphatics: Bilateral: Description- No Localized lymphadenopathy.      Assessment & Plan:  For your appearance of glossitis we will get cbc, folic acid, H99 and b1.  Magic mouthwash rx. Use swish and spit.  Rapid strep test was negative and sending out strep test.   Your inr was 3.1. I want you to take coumadin 2.5 mg tomorrow and Wednesday. Then resume your regular regimen. Then will repeat inr on your follow up.  Follow up as regularly scheduled or as needed  Jessicalynn Deshong, Percell Miller, PA-C

## 2016-11-29 LAB — CULTURE, GROUP A STREP
MICRO NUMBER:: 81177832
SPECIMEN QUALITY:: ADEQUATE

## 2016-11-30 ENCOUNTER — Telehealth: Payer: Self-pay | Admitting: Medical

## 2016-11-30 ENCOUNTER — Telehealth: Payer: Self-pay | Admitting: Family Medicine

## 2016-11-30 ENCOUNTER — Ambulatory Visit: Payer: Medicare Other

## 2016-11-30 DIAGNOSIS — R220 Localized swelling, mass and lump, head: Secondary | ICD-10-CM

## 2016-11-30 DIAGNOSIS — T7840XD Allergy, unspecified, subsequent encounter: Secondary | ICD-10-CM

## 2016-11-30 MED ORDER — FLUCONAZOLE 50 MG PO TABS: ORAL_TABLET | ORAL | 0 refills | Status: DC

## 2016-11-30 MED ORDER — FLUCONAZOLE 100 MG PO TABS: ORAL_TABLET | ORAL | 0 refills | Status: DC

## 2016-11-30 NOTE — Telephone Encounter (Signed)
Pt.notified

## 2016-11-30 NOTE — Telephone Encounter (Signed)
Note I did talk with patient today and advised her regarding her lip swelling and redness to tongue.  Explained to her the result note basically that I sent to my medical assistant.  But decided to send in Diflucan 100 mg tablets since the 50 mg tablets are not available.  Talked with our pharmacist about Diflucan and warfarin interaction Our pharmacist advised halfing the dose of warfarin on days she takes diflucan. I notified pt and explained. She will update me on Wednesday how mouth feels.

## 2016-11-30 NOTE — Telephone Encounter (Addendum)
°  Relation to pt: self Call back number:[phone] Pharmacy: [place], [place] - [address] [phone] (Phone) [phone] (Fax)    Reason for call:  Patient inquiring about lab results and states she's anxious to hear back from a nurse today due to sore throat and lip swelling didn't improve, please advise

## 2016-11-30 NOTE — Telephone Encounter (Signed)
Opened to review and send in new diflucan dose.

## 2016-11-30 NOTE — Telephone Encounter (Signed)
Pt called in because she spoke with assistant and was told that she would receive a call back. Pt says that she received her lab results. She said that the pharmacy called her with a Rx and stated that they do not have the requested dosage in stock until Monday. She would like to know if someone could get back with her to let her know what medication is for and also to assist her with what she should do considering that her local pharmacy is out.     Please call pt back to advise.

## 2016-11-30 NOTE — Telephone Encounter (Signed)
Sent in diflucan to her pharmacy. Considered 150 mg dose but decreased dose after considering epic warning on interaction with warfarin.

## 2016-11-30 NOTE — Telephone Encounter (Signed)
Opened to send diflucan rx in.

## 2016-12-01 LAB — VITAMIN B1: VITAMIN B1 (THIAMINE): 21 nmol/L (ref 8–30)

## 2016-12-01 NOTE — Telephone Encounter (Signed)
Mikayla [name] spoke with pt.

## 2016-12-06 ENCOUNTER — Other Ambulatory Visit: Payer: Self-pay | Admitting: Medical

## 2016-12-06 ENCOUNTER — Telehealth: Payer: Self-pay | Admitting: Medical

## 2016-12-06 ENCOUNTER — Other Ambulatory Visit: Payer: Self-pay | Admitting: Family Medicine

## 2016-12-06 ENCOUNTER — Telehealth: Payer: Self-pay | Admitting: Family Medicine

## 2016-12-06 NOTE — Telephone Encounter (Signed)
Caller name: Relation to SA:YTKZ Call back number: [phone] Pharmacy:[facility]  Reason for call: pt saw Percell Miller on 11/27/16, states she was dx with thrush, states Percell Miller gave her a rx for it and she has used all the meds, Percell Miller informed her to report on today and let him know if it cleared up, pt states the rash is still in her mouth and is no better. Pt is going out of town on Friday and would like to know what Percell Miller suggest that she do. Pt has an appt this morning please call after 2:00 today. Please advise. States she will leave at 9 however a detailed message can be left.

## 2016-12-06 NOTE — Telephone Encounter (Signed)
Please let pt know that I am waiting to get word back from Dr. Charlett Blake regarding her tongue, mouth and lip concerns. I sent her message today and she will be back in office tomorrow.  Thanks,

## 2016-12-06 NOTE — Telephone Encounter (Signed)
Dr. Charlett Blake,  I saw a patient of years recently over the last 10 days or so who presented with burning tongue and mouth.  She also reported some swelling of her lips but on exam I thought her lips were on the thin side.  However that was the first time I saw her.  Her exam showed some redness to the tongue and buccal mucosa.  Nothing real obvious in terms of abnormal findings.  No whitish discharge to the tongue or mucosa.  No recent antibiotics.  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  I did limited workup for burning tongue syndrome and prescribed Magic mouthwash.  Patient did not get better.  She still reported intermittent burning of tongue and mouth.  With occasional mild lip swelling.  On a phone follow-up with her I decide to go ahead and refer her to allergist and prescribed some Diflucan.  Wrote 2 tablets.  1 to take on the day of the call and then another in 4-5 days.  Patient reports no improvement.  She is about to go out of town on Friday and requesting some help for her symptoms.  I am temporarily out of ideas and wanted your advice.  I will try to touch base with you tomorrow.  Thanks, Percell Miller

## 2016-12-07 ENCOUNTER — Telehealth: Payer: Self-pay | Admitting: Medical

## 2016-12-07 ENCOUNTER — Ambulatory Visit (INDEPENDENT_AMBULATORY_CARE_PROVIDER_SITE_OTHER): Payer: Medicare Other | Admitting: Family Medicine

## 2016-12-07 ENCOUNTER — Encounter: Payer: Self-pay | Admitting: Family Medicine

## 2016-12-07 ENCOUNTER — Other Ambulatory Visit: Payer: Self-pay | Admitting: Family Medicine

## 2016-12-07 VITALS — BP 130/82 | HR 78 | Temp 98.2°F | Ht 65.0 in | Wt 173.2 lb

## 2016-12-07 DIAGNOSIS — R208 Other disturbances of skin sensation: Secondary | ICD-10-CM | POA: Diagnosis not present

## 2016-12-07 MED ORDER — ACYCLOVIR 400 MG PO TABS: 400.0000 mg | ORAL_TABLET | Freq: Every day | ORAL | 0 refills | Status: DC

## 2016-12-07 MED ORDER — LIDOCAINE VISCOUS 2 % MT SOLN: OROMUCOSAL | 0 refills | Status: DC

## 2016-12-07 MED ORDER — VALACYCLOVIR HCL 500 MG PO TABS: 500.0000 mg | ORAL_TABLET | Freq: Two times a day (BID) | ORAL | 0 refills | Status: DC

## 2016-12-07 NOTE — Progress Notes (Signed)
Pre visit review using our clinic review tool, if applicable. No additional management support is needed unless otherwise documented below in the visit note. 

## 2016-12-07 NOTE — Telephone Encounter (Signed)
rx acyclovir and lidocaine sent to pt pharmacy.

## 2016-12-07 NOTE — Telephone Encounter (Signed)
Notify pt sent Dr. Randel Pigg message and update her. She thinks mabye viral cause of her symptoms. Rx acyclovir tabs and lidocaine solution for mouth swish and spit.(meds sent to pharmacy)  May give med for nerve pain if this does not work completely. But not writing presently.

## 2016-12-07 NOTE — Progress Notes (Signed)
Chief Complaint  Patient presents with  . Sore Throat   Subjective: Patient is a 76 y.o. female here for f/u burning in mouth.  For the past several weeks, the patient has had burning in her mouth.  She did have a white spot on her inner lip.  She was treated for oral thrush.  Treatment was not particularly helpful.  The provider she saw had called in an antiviral and viscous lidocaine for symptom management, however by the time she was notified, she was already on her way to the appointment.  She is not having any fevers and denies any recent illness.  There was no ingestion of anything particularly hot or caustic.  Salt water gargles have been somewhat helpful.  While it is painful to swallow, she is not having difficulty swallowing.   ROS: HEENT: As noted in HPI  Family History  Problem Relation Age of Onset  . Heart disease Mother   . Hypertension Mother 44  . Heart disease Father 33  . Cancer Maternal Aunt        Breast  . Heart disease Maternal Grandmother   . Heart disease Maternal Grandfather   . Hernia Daughter   . Gallstones Daughter    Past Medical History:  Diagnosis Date  . Abdominal pain 08/25/2014  . Acute venous embolism and thrombosis of unspecified deep vessels of lower extremity    after broken foot screw left in place, left leg  . Anemia   . Aortic stenosis    echo 11/11: EF 60%, mild LVH, mod/severe AS with mean 32 mmHg, peak 55 mmHg, mild AI, mild MR, (previous echo 2010 with mean gradient 29 mmHg)  . Atrial tachycardia ([place])   . Bilateral dry eyes   . Brain aneurysm 01/16/2015  . Carotid stenosis    dopplers 11/11: 0-39% bilat  . Dehydration 08/25/2014  . Dermatitis contact, eyelid 04/16/2016  . Dyslipidemia   . GERD (gastroesophageal reflux disease)   . H/O measles   . History of chicken pox   . Hyperlipidemia   . Hyperlipidemia, mild 10/08/2014  . Osteopenia 10/08/2014  . Other congenital anomaly of toes   . Pain in joint, pelvic region and thigh   .  Palpitations    PACs and atrial tachy  . Peripheral edema 08/25/2014   Allergies  Allergen Reactions  . Indomethacin Nausea And Vomiting    Caused chest pain.  . Codeine Nausea And Vomiting    Current Outpatient Prescriptions:  .  acetaminophen (TYLENOL) 500 MG tablet, Take 1 tablet by mouth every 8 (eight) hours as needed for mild pain or headache. , Disp: , Rfl:  .  calcium citrate (CALCITRATE - DOSED IN MG ELEMENTAL CALCIUM) 950 MG tablet, Take 200 mg of elemental calcium by mouth daily., Disp: , Rfl:  .  CARTIA XT 240 MG 24 hr capsule, TAKE ONE CAPSULE BY MOUTH ONCE DAILY, Disp: 30 capsule, Rfl: 0 .  fluconazole (DIFLUCAN) 100 MG tablet, 1 tab po today. Repeat in 3 days if needed, Disp: 2 tablet, Rfl: 0 .  furosemide (LASIX) 20 MG tablet, Daily and prn for weight gain>3# in 24 hours or increased edema, Disp: 45 tablet, Rfl: 1 .  gabapentin (NEURONTIN) 800 MG tablet, TAKE 1 TABLET BY MOUTH THREE TIMES A DAY, Disp: 90 tablet, Rfl: 1 .  hyoscyamine (LEVSIN SL) 0.125 MG SL tablet, Place 1 tablet (0.125 mg total) under the tongue every 4 (four) hours as needed., Disp: 30 tablet, Rfl: 0 .  lidocaine (XYLOCAINE) 2 % solution, 3-5 ml po tid swish and spit, Disp: 100 mL, Rfl: 0 .  metoprolol tartrate (LOPRESSOR) 25 MG tablet, TAKE 1 TABLET BY MOUTH TWO (2) TIMES DAILY, Disp: 180 tablet, Rfl: 0 .  Multiple Vitamin (MULTIVITAMIN) tablet, Take 1 tablet by mouth daily.  , Disp: , Rfl:  .  multivitamin-lutein (OCUVITE-LUTEIN) CAPS capsule, Take 1 capsule by mouth daily., Disp: , Rfl:  .  Omega 3 1000 MG CAPS, Take 1,000 mg by mouth daily. Reported on 03/24/2015, Disp: , Rfl:  .  omeprazole (PRILOSEC) 40 MG capsule, TAKE ONE CAPSULE BY MOUTH ONCE DAILY, Disp: 90 capsule, Rfl: 5 .  Polyethyl Glycol-Propyl Glycol (SYSTANE OP), Apply 1 drop to eye 2 (two) times daily as needed. For dry eyes, Disp: , Rfl:  .  Probiotic Product (PHILLIPS COLON HEALTH PO), Take 1 capsule by mouth daily., Disp: , Rfl:  .   traMADol (ULTRAM) 50 MG tablet, Take 1 tablet (50 mg total) by mouth every 8 (eight) hours as needed., Disp: 40 tablet, Rfl: 0 .  warfarin (COUMADIN) 5 MG tablet, Take daily as directed by coumadin clinic., Disp: 90 tablet, Rfl: 0 .  valACYclovir (VALTREX) 500 MG tablet, Take 1 tablet (500 mg total) by mouth 2 (two) times daily., Disp: 10 tablet, Rfl: 0  Objective: BP 130/82 (BP Location: Left Arm, Patient Position: Sitting, Cuff Size: Normal)   Pulse 78   Temp 98.2 F (36.8 C) (Oral)   Ht 5\' 5"  (1.651 m)   Wt 173 lb 4 oz (78.6 kg)   SpO2 96%   BMI 28.83 kg/m  General: Awake, appears stated age HEENT: MMM, no mucosal lesions or ulcers or vesicles; I do not appreciate any edema, petechia or bleeding Neck: No swelling or asymmetry Heart: RRR, no murmurs Lungs: CTAB, no rales, wheezes or rhonchi. No accessory muscle use Psych: Age appropriate judgment and insight, normal affect and mood  Assessment and Plan: Burning sensation of mouth - Plan: valACYclovir (VALTREX) 500 MG tablet  Orders as above. I do not see anything that looks like thrush or herpes today. Change Acyclovir to Valtrex for convenience. OK with viscous lidocaine. She will call back after course of valtrex if no better and I will refer her to ENT. Burning mouth syndrome? F/u prn otherwise.  The patient voiced understanding and agreement to the plan.  [place], DO 12/07/16  11:43 AM

## 2016-12-07 NOTE — Patient Instructions (Signed)
We are replacing acyclovir with valacyclovir.   OK to use the new oral swish rather than Magic Mouthwash. This may provide more relief.  If you are not better by next Tues/Wed, send me a MyChart message or call. We will refer you to ENT (Ear, Nose and Throat specialist).  Let us know if you need anything.

## 2016-12-07 NOTE — Telephone Encounter (Signed)
Pt see today by Dr. Nani Ravens.

## 2016-12-07 NOTE — Telephone Encounter (Signed)
Sometimes there is a viral trigger. Try Acyclovir 400 mg po 5 x a day x 5 days, disp #25 and prescribe Viscous Lidocaine 2% 3-5 cc tid prn pain swish and spit. That should help her eat. Disp a small bottle., if she is no better I have seen Gabapentin used for the neuropathic piece of this but would not do this yet. Thanks

## 2016-12-08 ENCOUNTER — Other Ambulatory Visit: Payer: Self-pay

## 2016-12-08 MED ORDER — GABAPENTIN 800 MG PO TABS: 800.0000 mg | ORAL_TABLET | Freq: Three times a day (TID) | ORAL | 1 refills | Status: DC

## 2016-12-08 MED ORDER — DILTIAZEM HCL ER COATED BEADS 240 MG PO CP24: 240.0000 mg | ORAL_CAPSULE | Freq: Every day | ORAL | 0 refills | Status: DC

## 2016-12-08 MED ORDER — WARFARIN SODIUM 5 MG PO TABS: ORAL_TABLET | ORAL | 0 refills | Status: DC

## 2016-12-14 ENCOUNTER — Ambulatory Visit (INDEPENDENT_AMBULATORY_CARE_PROVIDER_SITE_OTHER): Payer: Medicare Other | Admitting: Family Medicine

## 2016-12-14 VITALS — BP 132/74 | HR 89 | Temp 98.1°F | Resp 16 | Wt 174.6 lb

## 2016-12-14 DIAGNOSIS — E785 Hyperlipidemia, unspecified: Secondary | ICD-10-CM | POA: Diagnosis not present

## 2016-12-14 DIAGNOSIS — R208 Other disturbances of skin sensation: Secondary | ICD-10-CM

## 2016-12-14 DIAGNOSIS — I10 Essential (primary) hypertension: Secondary | ICD-10-CM | POA: Diagnosis not present

## 2016-12-14 DIAGNOSIS — M8580 Other specified disorders of bone density and structure, unspecified site: Secondary | ICD-10-CM

## 2016-12-14 DIAGNOSIS — D649 Anemia, unspecified: Secondary | ICD-10-CM

## 2016-12-14 LAB — COMPREHENSIVE METABOLIC PANEL
ALK PHOS: 65 U/L (ref 39–117)
ALT: 15 U/L (ref 0–35)
AST: 18 U/L (ref 0–37)
Albumin: 4 g/dL (ref 3.5–5.2)
BUN: 15 mg/dL (ref 6–23)
CHLORIDE: 103 meq/L (ref 96–112)
CO2: 32 mEq/L (ref 19–32)
Calcium: 9.6 mg/dL (ref 8.4–10.5)
Creatinine, Ser: 0.62 mg/dL (ref 0.40–1.20)
GFR: 98.9 mL/min (ref >60.00)
GLUCOSE: 89 mg/dL (ref 70–99)
POTASSIUM: 4.8 meq/L (ref 3.5–5.1)
SODIUM: 140 meq/L (ref 135–145)
TOTAL PROTEIN: 7.2 g/dL (ref 6.0–8.3)
Total Bilirubin: 0.4 mg/dL (ref 0.2–1.2)

## 2016-12-14 LAB — TSH: TSH: 1.68 u[IU]/mL (ref 0.35–4.50)

## 2016-12-14 MED ORDER — VALACYCLOVIR HCL 500 MG PO TABS: 500.0000 mg | ORAL_TABLET | Freq: Two times a day (BID) | ORAL | 0 refills | Status: AC | PRN

## 2016-12-14 NOTE — Progress Notes (Signed)
Subjective:  I acted as a Education administrator for [employer]. [name], [place]   Patient ID: Mikayla [name], female    DOB: 09/13/1940, 76 y.o.   MRN: [id number]  Chief Complaint  Patient presents with  . Follow-up    HPI  Patient is in today for follow up visit and she feels well. She has had some mild hoarseness recently but no fevers, congestions, chills or malaise. She has noted some mild swelling in her ankles recently. Worse at the end of the day. No recent hospitalizations. Denies CP/palp/SOB/HA/congestion/fevers/GI or GU c/o. Taking meds as prescribed Patient Care Team: Mosie Lukes, MD as PCP - General (Family Medicine) Minus Breeding, MD as Consulting Physician (Cardiology) Linward Natal, MD as Referring Physician (Ophthalmology) Consuella Lose, MD as Consulting Physician (Neurosurgery) Kerin Perna., MD as Consulting Physician (Neurology)   Past Medical History:  Diagnosis Date  . Abdominal pain 08/25/2014  . Acute venous embolism and thrombosis of unspecified deep vessels of lower extremity    after broken foot screw left in place, left leg  . Anemia   . Aortic stenosis    echo 11/11: EF 60%, mild LVH, mod/severe AS with mean 32 mmHg, peak 55 mmHg, mild AI, mild MR, (previous echo 2010 with mean gradient 29 mmHg)  . Atrial tachycardia ([place])   . Bilateral dry eyes   . Brain aneurysm 01/16/2015  . Carotid stenosis    dopplers 11/11: 0-39% bilat  . Dehydration 08/25/2014  . Dermatitis contact, eyelid 04/16/2016  . Dyslipidemia   . GERD (gastroesophageal reflux disease)   . H/O measles   . History of chicken pox   . Hyperlipidemia   . Hyperlipidemia, mild 10/08/2014  . Osteopenia 10/08/2014  . Other congenital anomaly of toes   . Pain in joint, pelvic region and thigh   . Palpitations    PACs and atrial tachy  . Peripheral edema 08/25/2014    Past Surgical History:  Procedure Laterality Date  . CHOLECYSTECTOMY    . FOOT SURGERY    . hyroglossal duct cyst resection      . left hip replacement    . RADICAL HYSTERECTOMY    . TONSILLECTOMY      Family History  Problem Relation Age of Onset  . Heart disease Mother   . Hypertension Mother 56  . Heart disease Father 98  . Cancer Maternal Aunt        Breast  . Heart disease Maternal Grandmother   . Heart disease Maternal Grandfather   . Hernia Daughter   . Gallstones Daughter     Social History   Socioeconomic History  . Marital status: Divorced    Spouse name: Not on file  . Number of children: Not on file  . Years of education: Not on file  . Highest education level: Not on file  Social Needs  . Financial resource strain: Not on file  . Food insecurity - worry: Not on file  . Food insecurity - inability: Not on file  . Transportation needs - medical: Not on file  . Transportation needs - non-medical: Not on file  Occupational History  . Not on file  Tobacco Use  . Smoking status: Never Smoker  . Smokeless tobacco: Never Used  Substance and Sexual Activity  . Alcohol use: No  . Drug use: No  . 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 Not on  file  Social History Narrative  . Not on file    Outpatient Medications Prior to Visit  Medication Sig Dispense Refill  . acetaminophen (TYLENOL) 500 MG tablet Take 1 tablet by mouth every 8 (eight) hours as needed for mild pain or headache.     . calcium citrate (CALCITRATE - DOSED IN MG ELEMENTAL CALCIUM) 950 MG tablet Take 200 mg of elemental calcium by mouth daily.    Marland Kitchen diltiazem (CARTIA XT) 240 MG 24 hr capsule Take 1 capsule (240 mg total) by mouth daily. 30 capsule 0  . fluconazole (DIFLUCAN) 100 MG tablet 1 tab po today. Repeat in 3 days if needed 2 tablet 0  . furosemide (LASIX) 20 MG tablet Daily and prn for weight gain>3# in 24 hours or increased edema 45 tablet 1  . gabapentin (NEURONTIN) 800 MG tablet Take 1 tablet (800 mg total) by mouth 3 (three) times  daily. 90 tablet 1  . hyoscyamine (LEVSIN SL) 0.125 MG SL tablet Place 1 tablet (0.125 mg total) under the tongue every 4 (four) hours as needed. 30 tablet 0  . lidocaine (XYLOCAINE) 2 % solution 3-5 ml po tid swish and spit 100 mL 0  . metoprolol tartrate (LOPRESSOR) 25 MG tablet TAKE 1 TABLET BY MOUTH TWO (2) TIMES DAILY 180 tablet 0  . Multiple Vitamin (MULTIVITAMIN) tablet Take 1 tablet by mouth daily.      . multivitamin-lutein (OCUVITE-LUTEIN) CAPS capsule Take 1 capsule by mouth daily.    . Omega 3 1000 MG CAPS Take 1,000 mg by mouth daily. Reported on 03/24/2015    . omeprazole (PRILOSEC) 40 MG capsule TAKE ONE CAPSULE BY MOUTH ONCE DAILY 90 capsule 5  . Polyethyl Glycol-Propyl Glycol (SYSTANE OP) Apply 1 drop to eye 2 (two) times daily as needed. For dry eyes    . Probiotic Product (PHILLIPS COLON HEALTH PO) Take 1 capsule by mouth daily.    . traMADol (ULTRAM) 50 MG tablet Take 1 tablet (50 mg total) by mouth every 8 (eight) hours as needed. 40 tablet 0  . warfarin (COUMADIN) 5 MG tablet Take daily as directed by coumadin clinic. 90 tablet 0   No facility-administered medications prior to visit.     Allergies  Allergen Reactions  . Indomethacin Nausea And Vomiting    Caused chest pain.  . Codeine Nausea And Vomiting    Review of Systems  Constitutional: Negative for fever and malaise/fatigue.  HENT: Negative for congestion.   Respiratory: Negative for cough and shortness of breath.   Cardiovascular: Positive for leg swelling. Negative for chest pain and palpitations.  Gastrointestinal: Negative for vomiting.  Musculoskeletal: Negative for back pain.  Skin: Negative for rash.  Neurological: Negative for loss of consciousness and headaches.       Objective:    Physical Exam  BP 132/74   Pulse 89   Temp 98.1 F (36.7 C) (Oral)   Resp 16   Wt 174 lb 9.6 oz (79.2 kg)   SpO2 100%   BMI 29.05 kg/m  Wt Readings from Last 3 Encounters:  12/14/16 174 lb 9.6 oz (79.2  kg)  12/07/16 173 lb 4 oz (78.6 kg)  11/27/16 175 lb (79.4 kg)   BP Readings from Last 3 Encounters:  12/14/16 132/74  12/07/16 130/82  11/27/16 (!) 155/63     Immunization History  Administered Date(s) Administered  . Influenza, High Dose Seasonal PF 10/08/2014, 12/02/2015, 11/17/2016  . Pneumococcal Conjugate-13 11/27/2013  . Pneumococcal Polysaccharide-23 12/02/2015  . Tdap  12/07/2011  . Zoster 03/18/2008    Health Maintenance  Topic Date Due  . TETANUS/TDAP  12/06/2021  . INFLUENZA VACCINE  Completed  . DEXA SCAN  Completed  . PNA vac Low Risk Adult  Addressed    Lab Results  Component Value Date   WBC 7.0 11/27/2016   HGB 13.4 11/27/2016   HCT 41.0 11/27/2016   PLT 246.0 11/27/2016   GLUCOSE 89 12/14/2016   CHOL 194 04/13/2016   TRIG 66.0 04/13/2016   HDL 60.70 04/13/2016   LDLCALC 120 (H) 04/13/2016   ALT 15 12/14/2016   AST 18 12/14/2016   NA 140 12/14/2016   K 4.8 12/14/2016   CL 103 12/14/2016   CREATININE 0.62 12/14/2016   BUN 15 12/14/2016   CO2 32 12/14/2016   TSH 1.68 12/14/2016   INR 2.1 11/27/2016    Lab Results  Component Value Date   TSH 1.68 12/14/2016   Lab Results  Component Value Date   WBC 7.0 11/27/2016   HGB 13.4 11/27/2016   HCT 41.0 11/27/2016   MCV 93.2 11/27/2016   PLT 246.0 11/27/2016   Lab Results  Component Value Date   NA 140 12/14/2016   K 4.8 12/14/2016   CO2 32 12/14/2016   GLUCOSE 89 12/14/2016   BUN 15 12/14/2016   CREATININE 0.62 12/14/2016   BILITOT 0.4 12/14/2016   ALKPHOS 65 12/14/2016   AST 18 12/14/2016   ALT 15 12/14/2016   PROT 7.2 12/14/2016   ALBUMIN 4.0 12/14/2016   CALCIUM 9.6 12/14/2016   ANIONGAP 9 01/15/2015   GFR 98.90 12/14/2016   Lab Results  Component Value Date   CHOL 194 04/13/2016   Lab Results  Component Value Date   HDL 60.70 04/13/2016   Lab Results  Component Value Date   LDLCALC 120 (H) 04/13/2016   Lab Results  Component Value Date   TRIG 66.0 04/13/2016    Lab Results  Component Value Date   CHOLHDL 3 04/13/2016   No results found for: HGBA1C       Assessment & Plan:   Problem List Items Addressed This Visit    Benign essential HTN    Well controlled, no changes to meds. Encouraged heart healthy diet such as the DASH diet and exercise as tolerated.       Anemia    resolved      Hyperlipidemia, mild    Encouraged heart healthy diet, increase exercise, avoid trans fats, consider a krill oil cap daily      Osteopenia    Encouraged to get adequate exercise, calcium and vitamin d intake       Other Visit Diagnoses    Essential hypertension    -  Primary   Relevant Orders   TSH (Completed)   Comprehensive metabolic panel (Completed)   Burning sensation of mouth       Relevant Medications   valACYclovir (VALTREX) 500 MG tablet      I have changed Mikayla [name]'s valACYclovir. I am also having her maintain her multivitamin, Omega 3, Polyethyl Glycol-Propyl Glycol (SYSTANE OP), acetaminophen, calcium citrate, traMADol, Probiotic Product (PHILLIPS COLON HEALTH PO), furosemide, hyoscyamine, multivitamin-lutein, omeprazole, metoprolol tartrate, fluconazole, lidocaine, gabapentin, diltiazem, and warfarin.  Meds ordered this encounter  Medications  . valACYclovir (VALTREX) 500 MG tablet    Sig: Take 1 tablet (500 mg total) 2 (two) times daily as needed for up to 5 days by mouth.    Dispense:  14 tablet    Refill:  0    CMA served as Education administrator during this visit. History, Physical and Plan performed by medical provider. Documentation and orders reviewed and attested to.  Penni Homans, MD

## 2016-12-14 NOTE — Patient Instructions (Addendum)
Skip Coumadin tomorrow 11/9 then restart at 5 mg daily except on Tues and Saturday take 2.5 mg then back next week for INR check  Raise ankles above heart twice daily for roughly 15 minutes when ankles sell. Minimize sodium in diet Consider compression stockings  Hoarseness Hoarseness is any abnormal change in your voice.Hoarseness can make it difficult to speak. Your voice may sound raspy, breathy, or strained. Hoarseness is caused by a problem with the vocal cords. The vocal cords are two bands of tissue inside your voice box (larynx). When you speak, your vocal cords move back and forth to create sound. The surfaces of your vocal cords need to be smooth for your voice to sound clear. Swelling or lumps on the vocal cords can cause hoarseness. Common causes of vocal cord problems include:  Upper airway infection.  A long-term cough.  Straining or overusing your voice.  Smoking.  Allergies.  Vocal cord growths.  Stomach acids that flow up from your stomach and irritate your vocal cords (gastroesophageal reflux).  Follow these instructions at home: Watch your condition for any changes. To ease any discomfort that you feel:  Rest your voice. Do not whisper. Whispering can cause muscle strain.  Do not speak in a loud or harsh voice that makes your hoarseness worse.  Do not use any tobacco products, including cigarettes, chewing tobacco, or electronic cigarettes. If you need help quitting, ask your health care provider.  Avoid secondhand smoke.  Do not eat foods that give you heartburn. Heartburn can make gastroesophageal reflux worse.  Do not drink coffee.  Do not drink alcohol.  Drink enough fluids to keep your urine clear or pale yellow.  Use a humidifier if the air in your home is dry.  Contact a health care provider if:  You have hoarseness that lasts longer than 3 weeks.  You almost lose or completelylose your voice for longer than 3 days.  You have pain when  you swallow or try to talk.  You feel a lump in your neck. Get help right away if:  You have trouble swallowing.  You feel as though you are choking when you swallow.  You cough up blood or vomit blood.  You have trouble breathing. This information is not intended to replace advice given to you by your health care provider. Make sure you discuss any questions you have with your health care provider. Document Released: 01/06/2005 Document Revised: 07/01/2015 Document Reviewed: 01/14/2014 Elsevier Interactive Patient Education  [employer].

## 2016-12-15 NOTE — Progress Notes (Deleted)
Subjective:   Mikayla [name] is a 76 y.o. female who presents for Medicare Annual (Subsequent) preventive examination.  Review of Systems: No ROS.  Medicare Wellness Visit. Additional risk factors are reflected in the social history.    Sleep patterns:    Female:    Mammo-       Dexa scan-  Last 11/11/14: osteopenia      CCS-    Objective:     Vitals: There were no vitals taken for this visit.  There is no height or weight on file to calculate BMI.   Tobacco Social History   Tobacco Use  Smoking Status Never Smoker  Smokeless Tobacco Never Used     Counseling given: Not Answered   Past Medical History:  Diagnosis Date  . Abdominal pain 08/25/2014  . Acute venous embolism and thrombosis of unspecified deep vessels of lower extremity    after broken foot screw left in place, left leg  . Anemia   . Aortic stenosis    echo 11/11: EF 60%, mild LVH, mod/severe AS with mean 32 mmHg, peak 55 mmHg, mild AI, mild MR, (previous echo 2010 with mean gradient 29 mmHg)  . Atrial tachycardia ([place])   . Bilateral dry eyes   . Brain aneurysm 01/16/2015  . Carotid stenosis    dopplers 11/11: 0-39% bilat  . Dehydration 08/25/2014  . Dermatitis contact, eyelid 04/16/2016  . Dyslipidemia   . GERD (gastroesophageal reflux disease)   . H/O measles   . History of chicken pox   . Hyperlipidemia   . Hyperlipidemia, mild 10/08/2014  . Osteopenia 10/08/2014  . Other congenital anomaly of toes   . Pain in joint, pelvic region and thigh   . Palpitations    PACs and atrial tachy  . Peripheral edema 08/25/2014   Past Surgical History:  Procedure Laterality Date  . CHOLECYSTECTOMY    . FOOT SURGERY    . hyroglossal duct cyst resection    . left hip replacement    . RADICAL HYSTERECTOMY    . TONSILLECTOMY     Family History  Problem Relation Age of Onset  . Heart disease Mother   . Hypertension Mother 35  . Heart disease Father 72  . Cancer Maternal Aunt        Breast  . Heart disease  Maternal Grandmother   . Heart disease Maternal Grandfather   . Hernia Daughter   . 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 acetaminophen (TYLENOL) 500 MG tablet Take 1 tablet by mouth every 8 (eight) hours as needed for mild pain or headache.   . calcium citrate (CALCITRATE - DOSED IN MG ELEMENTAL CALCIUM) 950 MG tablet Take 200 mg of elemental calcium by mouth daily.  Marland Kitchen diltiazem (CARTIA XT) 240 MG 24 hr capsule Take 1 capsule (240 mg total) by mouth daily.  . fluconazole (DIFLUCAN) 100 MG tablet 1 tab po today. Repeat in 3 days if needed  . furosemide (LASIX) 20 MG tablet Daily and prn for weight gain>3# in 24 hours or increased edema  . gabapentin (NEURONTIN) 800 MG tablet Take 1 tablet (800 mg total) by mouth 3 (three) times daily.  . hyoscyamine (LEVSIN SL) 0.125 MG SL tablet Place 1 tablet (0.125 mg total) under the tongue every  4 (four) hours as needed.  . lidocaine (XYLOCAINE) 2 % solution 3-5 ml po tid swish and spit  . metoprolol tartrate (LOPRESSOR) 25 MG tablet TAKE 1 TABLET BY MOUTH TWO (2) TIMES DAILY  . Multiple Vitamin (MULTIVITAMIN) tablet Take 1 tablet by mouth daily.    . multivitamin-lutein (OCUVITE-LUTEIN) CAPS capsule Take 1 capsule by mouth daily.  . Omega 3 1000 MG CAPS Take 1,000 mg by mouth daily. Reported on 03/24/2015  . omeprazole (PRILOSEC) 40 MG capsule TAKE ONE CAPSULE BY MOUTH ONCE DAILY  . Polyethyl Glycol-Propyl Glycol (SYSTANE OP) Apply 1 drop to eye 2 (two) times daily as needed. For dry eyes  . Probiotic Product (PHILLIPS COLON HEALTH PO) Take 1 capsule by mouth daily.  . traMADol (ULTRAM) 50 MG tablet Take 1 tablet (50 mg total) by mouth every 8 (eight) hours as needed.  . valACYclovir (VALTREX) 500 MG tablet Take 1 tablet (500 mg total) 2 (two) times  daily as needed for up to 5 days by mouth.  . warfarin (COUMADIN) 5 MG tablet Take daily as directed by coumadin clinic.   No facility-administered encounter medications on file as of 12/19/2016.     Activities of Daily Living No flowsheet data found.  Patient Care Team: Mosie Lukes, MD as PCP - General (Family Medicine) Minus Breeding, MD as Consulting Physician (Cardiology) Linward Natal, MD as Referring Physician (Ophthalmology) Consuella Lose, MD as Consulting Physician (Neurosurgery) Kerin Perna., MD as Consulting Physician (Neurology)    Assessment:    Physical assessment deferred to PCP.  Exercise Activities and Dietary recommendations   Diet (meal preparation, eat out, water intake, caffeinated beverages, dairy products, fruits and vegetables): {Desc; diets:16563} Breakfast: Lunch:  Dinner:      Goals    None     Fall Risk Fall Risk  12/02/2015 07/07/2014  Falls in the past year? No No   Depression Screen PHQ 2/9 Scores 12/02/2015 07/07/2014  PHQ - 2 Score 0 0     Cognitive Function MMSE - Mini Mental State Exam 12/02/2015  Orientation to time 5  Orientation to Place 5  Registration 3  Attention/ Calculation 5  Recall 3  Language- name 2 objects 2  Language- repeat 1  Language- follow 3 step command 3  Language- read & follow direction 1  Write a sentence 1  Copy design 1  Total score 30        Immunization History  Administered Date(s) Administered  . Influenza, High Dose Seasonal PF 10/08/2014, 12/02/2015, 11/17/2016  . Pneumococcal Conjugate-13 11/27/2013  . Pneumococcal Polysaccharide-23 12/02/2015  . Tdap 12/07/2011  . Zoster 03/18/2008   Screening Tests Health Maintenance  Topic Date Due  . TETANUS/TDAP  12/06/2021  . INFLUENZA VACCINE  Completed  . DEXA SCAN  Completed  . PNA vac Low Risk Adult  Addressed      Plan:   ***   I have personally reviewed and noted the following in the patient's chart:    . Medical and social history . Use of alcohol, tobacco or illicit drugs  . Current medications and supplements . Functional ability and status . Nutritional status . Physical activity . Advanced directives . List of other physicians . Hospitalizations, surgeries, and ER visits in previous 12 months . Vitals . Screenings to include cognitive, depression, and falls . Referrals and appointments  In addition, I have reviewed and discussed with patient certain preventive protocols, quality metrics, and best practice recommendations. A written personalized care plan  for preventive services as well as general preventive health recommendations were provided to patient.     Shela Nevin, [place]  12/15/2016

## 2016-12-18 NOTE — Assessment & Plan Note (Signed)
resolved 

## 2016-12-18 NOTE — Assessment & Plan Note (Signed)
Encouraged to get adequate exercise, calcium and vitamin d intake 

## 2016-12-18 NOTE — Assessment & Plan Note (Signed)
Encouraged heart healthy diet, increase exercise, avoid trans fats, consider a krill oil cap daily 

## 2016-12-18 NOTE — Assessment & Plan Note (Signed)
Well controlled, no changes to meds. Encouraged heart healthy diet such as the DASH diet and exercise as tolerated.  °

## 2016-12-19 ENCOUNTER — Ambulatory Visit: Payer: Medicare Other | Admitting: *Deleted

## 2016-12-19 ENCOUNTER — Ambulatory Visit (INDEPENDENT_AMBULATORY_CARE_PROVIDER_SITE_OTHER): Payer: Medicare Other | Admitting: *Deleted

## 2016-12-19 ENCOUNTER — Encounter: Payer: Self-pay | Admitting: *Deleted

## 2016-12-19 VITALS — BP 134/80 | HR 58 | Wt 173.4 lb

## 2016-12-19 DIAGNOSIS — Z7901 Long term (current) use of anticoagulants: Secondary | ICD-10-CM | POA: Diagnosis not present

## 2016-12-19 DIAGNOSIS — Z0000 Encounter for general adult medical examination without abnormal findings: Secondary | ICD-10-CM | POA: Diagnosis not present

## 2016-12-19 LAB — POCT INR: INR: 2.1

## 2016-12-19 NOTE — Patient Instructions (Addendum)
Continue taking Coumadin 5 mg daily, except on Tuesday and Saturday take 2.5 mg. Return for INR check in 2 weeks per Dr.Blyth   [name] , Thank you for taking time to come for your Medicare Wellness Visit. I appreciate your ongoing commitment to your health goals. Please review the following plan we discussed and let me know if I can assist you in the future.   These are the goals we discussed: Maintain current health and independence.   This is a list of the screening recommended for you and due dates:  Health Maintenance  Topic Date Due  . Tetanus Vaccine  12/06/2021  . Flu Shot  Completed  . DEXA scan (bone density measurement)  Completed  . Pneumonia vaccines  Addressed    Health Maintenance for Postmenopausal Women Menopause is a normal process in which your reproductive ability comes to an end. This process happens gradually over a span of months to years, usually between the ages of 95 and 3. Menopause is complete when you have missed 12 consecutive menstrual periods. It is important to talk with your health care provider about some of the most common conditions that affect postmenopausal women, such as heart disease, cancer, and bone loss (osteoporosis). Adopting a healthy lifestyle and getting preventive care can help to promote your health and wellness. Those actions can also lower your chances of developing some of these common conditions. What should I know about menopause? During menopause, you may experience a number of symptoms, such as:  Moderate-to-severe hot flashes.  Night sweats.  Decrease in sex drive.  Mood swings.  Headaches.  Tiredness.  Irritability.  Memory problems.  Insomnia.  Choosing to treat or not to treat menopausal changes is an individual decision that you make with your health care provider. What should I know about hormone replacement therapy and supplements? Hormone therapy products are effective for treating symptoms that are  associated with menopause, such as hot flashes and night sweats. Hormone replacement carries certain risks, especially as you become older. If you are thinking about using estrogen or estrogen with progestin treatments, discuss the benefits and risks with your health care provider. What should I know about heart disease and stroke? Heart disease, heart attack, and stroke become more likely as you age. This may be due, in part, to the hormonal changes that your body experiences during menopause. These can affect how your body processes dietary fats, triglycerides, and cholesterol. Heart attack and stroke are both medical emergencies. There are many things that you can do to help prevent heart disease and stroke:  Have your blood pressure checked at least every 1-2 years. High blood pressure causes heart disease and increases the risk of stroke.  If you are 68-21 years old, ask your health care provider if you should take aspirin to prevent a heart attack or a stroke.  Do not use any tobacco products, including cigarettes, chewing tobacco, or electronic cigarettes. If you need help quitting, ask your health care provider.  It is important to eat a healthy diet and maintain a healthy weight. ? Be sure to include plenty of vegetables, fruits, low-fat dairy products, and lean protein. ? Avoid eating foods that are high in solid fats, added sugars, or salt (sodium).  Get regular exercise. This is one of the most important things that you can do for your health. ? Try to exercise for at least 150 minutes each week. The type of exercise that you do should increase your heart rate  and make you sweat. This is known as moderate-intensity exercise. ? Try to do strengthening exercises at least twice each week. Do these in addition to the moderate-intensity exercise.  Know your numbers.Ask your health care provider to check your cholesterol and your blood glucose. Continue to have your blood tested as directed  by your health care provider.  What should I know about cancer screening? There are several types of cancer. Take the following steps to reduce your risk and to catch any cancer development as early as possible. Breast Cancer  Practice breast self-awareness. ? This means understanding how your breasts normally appear and feel. ? It also means doing regular breast self-exams. Let your health care provider know about any changes, no matter how small.  If you are 21 or older, have a clinician do a breast exam (clinical breast exam or CBE) every year. Depending on your age, family history, and medical history, it may be recommended that you also have a yearly breast X-ray (mammogram).  If you have a family history of breast cancer, talk with your health care provider about genetic screening.  If you are at high risk for breast cancer, talk with your health care provider about having an MRI and a mammogram every year.  Breast cancer (BRCA) gene test is recommended for women who have family members with BRCA-related cancers. Results of the assessment will determine the need for genetic counseling and BRCA1 and for BRCA2 testing. BRCA-related cancers include these types: ? Breast. This occurs in males or females. ? Ovarian. ? Tubal. This may also be called fallopian tube cancer. ? Cancer of the abdominal or pelvic lining (peritoneal cancer). ? Prostate. ? Pancreatic.  Cervical, Uterine, and Ovarian Cancer Your health care provider may recommend that you be screened regularly for cancer of the pelvic organs. These include your ovaries, uterus, and vagina. This screening involves a pelvic exam, which includes checking for microscopic changes to the surface of your cervix (Pap test).  For women ages 21-65, health care providers may recommend a pelvic exam and a Pap test every three years. For women ages 100-65, they may recommend the Pap test and pelvic exam, combined with testing for human papilloma  virus (HPV), every five years. Some types of HPV increase your risk of cervical cancer. Testing for HPV may also be done on women of any age who have unclear Pap test results.  Other health care providers may not recommend any screening for nonpregnant women who are considered low risk for pelvic cancer and have no symptoms. Ask your health care provider if a screening pelvic exam is right for you.  If you have had past treatment for cervical cancer or a condition that could lead to cancer, you need Pap tests and screening for cancer for at least 20 years after your treatment. If Pap tests have been discontinued for you, your risk factors (such as having a new sexual partner) need to be reassessed to determine if you should start having screenings again. Some women have medical problems that increase the chance of getting cervical cancer. In these cases, your health care provider may recommend that you have screening and Pap tests more often.  If you have a family history of uterine cancer or ovarian cancer, talk with your health care provider about genetic screening.  If you have vaginal bleeding after reaching menopause, tell your health care provider.  There are currently no reliable tests available to screen for ovarian cancer.  Lung Cancer Lung  cancer screening is recommended for adults 4-32 years old who are at high risk for lung cancer because of a history of smoking. A yearly low-dose CT scan of the lungs is recommended if you:  Currently smoke.  Have a history of at least 30 pack-years of smoking and you currently smoke or have quit within the past 15 years. A pack-year is smoking an average of one pack of cigarettes per day for one year.  Yearly screening should:  Continue until it has been 15 years since you quit.  Stop if you develop a health problem that would prevent you from having lung cancer treatment.  Colorectal Cancer  This type of cancer can be detected and can often  be prevented.  Routine colorectal cancer screening usually begins at age 66 and continues through age 24.  If you have risk factors for colon cancer, your health care provider may recommend that you be screened at an earlier age.  If you have a family history of colorectal cancer, talk with your health care provider about genetic screening.  Your health care provider may also recommend using home test kits to check for hidden blood in your stool.  A small camera at the end of a tube can be used to examine your colon directly (sigmoidoscopy or colonoscopy). This is done to check for the earliest forms of colorectal cancer.  Direct examination of the colon should be repeated every 5-10 years until age 65. However, if early forms of precancerous polyps or small growths are found or if you have a family history or genetic risk for colorectal cancer, you may need to be screened more often.  Skin Cancer  Check your skin from head to toe regularly.  Monitor any moles. Be sure to tell your health care provider: ? About any new moles or changes in moles, especially if there is a change in a mole's shape or color. ? If you have a mole that is larger than the size of a pencil eraser.  If any of your family members has a history of skin cancer, especially at a young age, talk with your health care provider about genetic screening.  Always use sunscreen. Apply sunscreen liberally and repeatedly throughout the day.  Whenever you are outside, protect yourself by wearing long sleeves, pants, a wide-brimmed hat, and sunglasses.  What should I know about osteoporosis? Osteoporosis is a condition in which bone destruction happens more quickly than new bone creation. After menopause, you may be at an increased risk for osteoporosis. To help prevent osteoporosis or the bone fractures that can happen because of osteoporosis, the following is recommended:  If you are 46-41 years old, get at least 1,000 mg of  calcium and at least 600 mg of vitamin D per day.  If you are older than age 28 but younger than age 41, get at least 1,200 mg of calcium and at least 600 mg of vitamin D per day.  If you are older than age 21, get at least 1,200 mg of calcium and at least 800 mg of vitamin D per day.  Smoking and excessive alcohol intake increase the risk of osteoporosis. Eat foods that are rich in calcium and vitamin D, and do weight-bearing exercises several times each week as directed by your health care provider. What should I know about how menopause affects my mental health? Depression may occur at any age, but it is more common as you become older. Common symptoms of depression include:  Low or sad mood.  Changes in sleep patterns.  Changes in appetite or eating patterns.  Feeling an overall lack of motivation or enjoyment of activities that you previously enjoyed.  Frequent crying spells.  Talk with your health care provider if you think that you are experiencing depression. What should I know about immunizations? It is important that you get and maintain your immunizations. These include:  Tetanus, diphtheria, and pertussis (Tdap) booster vaccine.  Influenza every year before the flu season begins.  Pneumonia vaccine.  Shingles vaccine.  Your health care provider may also recommend other immunizations. This information is not intended to replace advice given to you by your health care provider. Make sure you discuss any questions you have with your health care provider. Document Released: 03/17/2005 Document Revised: 08/13/2015 Document Reviewed: 10/27/2014 Elsevier Interactive Patient Education  2018 [employer].

## 2016-12-19 NOTE — Progress Notes (Signed)
Subjective:   Mikayla [name] is a 76 y.o. female who presents for Medicare Annual (Subsequent) preventive examination.  Review of Systems: No ROS.  Medicare Wellness Visit. Additional risk factors are reflected in the social history.    Sleep patterns: Sleeps very well per pt for about 7 hrs per night.  Female:    Mammo- No longer doing routine screening due to age.       Dexa scan-   Last 11/11/14:   osteopenia  CCS- pt reports last in [place]. Pt reports she was told she should not get another one. Pt will consider cologuard.     Objective:     Vitals: BP 134/80 (BP Location: Left Arm, Patient Position: Sitting, Cuff Size: Normal)   Pulse (!) 58   Wt 173 lb 6.4 oz (78.7 kg)   SpO2 97%   BMI 28.86 kg/m   Body mass index is 28.86 kg/m.   Tobacco Social History   Tobacco Use  Smoking Status Never Smoker  Smokeless Tobacco Never Used     Counseling given: Not Answered   Past Medical History:  Diagnosis Date  . Abdominal pain 08/25/2014  . Acute venous embolism and thrombosis of unspecified deep vessels of lower extremity    after broken foot screw left in place, left leg  . Anemia   . Aortic stenosis    echo 11/11: EF 60%, mild LVH, mod/severe AS with mean 32 mmHg, peak 55 mmHg, mild AI, mild MR, (previous echo 2010 with mean gradient 29 mmHg)  . Atrial tachycardia ([place])   . Bilateral dry eyes   . Brain aneurysm 01/16/2015  . Carotid stenosis    dopplers 11/11: 0-39% bilat  . Dehydration 08/25/2014  . Dermatitis contact, eyelid 04/16/2016  . Dyslipidemia   . GERD (gastroesophageal reflux disease)   . H/O measles   . History of chicken pox   . Hyperlipidemia   . Hyperlipidemia, mild 10/08/2014  . Macular degeneration    Dr.DAbanzo  . Osteopenia 10/08/2014  . Other congenital anomaly of toes   . Pain in joint, pelvic region and thigh   . Palpitations    PACs and atrial tachy  . Peripheral edema 08/25/2014  . Thrush    Past Surgical History:  Procedure  Laterality Date  . CHOLECYSTECTOMY    . FOOT SURGERY    . hyroglossal duct cyst resection    . left hip replacement    . RADICAL HYSTERECTOMY    . TONSILLECTOMY     Family History  Problem Relation Age of Onset  . Heart disease Mother   . Hypertension Mother 24  . Heart disease Father 4  . Cancer Maternal Aunt        Breast  . Heart disease Maternal Grandmother   . Heart disease Maternal Grandfather   . Hernia Daughter   . 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 acetaminophen (TYLENOL) 500 MG tablet Take 1 tablet by mouth every 8 (eight) hours as needed for mild pain or headache.   . calcium citrate (CALCITRATE - DOSED IN MG ELEMENTAL CALCIUM) 950 MG tablet Take 200 mg of elemental calcium by mouth daily.  Marland Kitchen diltiazem (CARTIA XT) 240 MG 24 hr capsule Take 1 capsule (240 mg total) by mouth daily.  Marland Kitchen  furosemide (LASIX) 20 MG tablet Daily and prn for weight gain>3# in 24 hours or increased edema  . gabapentin (NEURONTIN) 800 MG tablet Take 1 tablet (800 mg total) by mouth 3 (three) times daily. (Patient taking differently: Take 800 mg 2 (two) times daily by mouth. )  . hyoscyamine (LEVSIN SL) 0.125 MG SL tablet Place 1 tablet (0.125 mg total) under the tongue every 4 (four) hours as needed.  . lidocaine (XYLOCAINE) 2 % solution 3-5 ml po tid swish and spit  . metoprolol tartrate (LOPRESSOR) 25 MG tablet TAKE 1 TABLET BY MOUTH TWO (2) TIMES DAILY  . Multiple Vitamin (MULTIVITAMIN) tablet Take 1 tablet by mouth daily.    . multivitamin-lutein (OCUVITE-LUTEIN) CAPS capsule Take 1 capsule by mouth daily.  . Omega 3 1000 MG CAPS Take 1,000 mg by mouth daily. Reported on 03/24/2015  . omeprazole (PRILOSEC) 40 MG capsule TAKE ONE CAPSULE BY MOUTH ONCE DAILY  . Polyethyl Glycol-Propyl Glycol  (SYSTANE OP) Apply 1 drop to eye 2 (two) times daily as needed. For dry eyes  . Probiotic Product (PHILLIPS COLON HEALTH PO) Take 1 capsule by mouth daily.  . traMADol (ULTRAM) 50 MG tablet Take 1 tablet (50 mg total) by mouth every 8 (eight) hours as needed.  . valACYclovir (VALTREX) 500 MG tablet Take 1 tablet (500 mg total) 2 (two) times daily as needed for up to 5 days by mouth.  . warfarin (COUMADIN) 5 MG tablet Take daily as directed by coumadin clinic.  . [DISCONTINUED] fluconazole (DIFLUCAN) 100 MG tablet 1 tab po today. Repeat in 3 days if needed   No facility-administered encounter medications on file as of 12/19/2016.     Activities of Daily Living In your present state of health, do you have any difficulty performing the following activities: 12/19/2016  Hearing? Y  Comment Pt declines referral to audiology.  Vision? N  Comment wears glasses. hx of cataract sx.  Difficulty concentrating or making decisions? N  Walking or climbing stairs? Y  Dressing or bathing? N  Doing errands, shopping? N  Preparing Food and eating ? N  Using the Toilet? N  In the past six months, have you accidently leaked urine? N  Do you have problems with loss of bowel control? N  Managing your Medications? N  Managing your Finances? N  Housekeeping or managing your Housekeeping? N  Some recent data might be hidden    Patient Care Team: Mosie Lukes, MD as PCP - General (Family Medicine) Minus Breeding, MD as Consulting Physician (Cardiology) Linward Natal, MD as Referring Physician (Ophthalmology) Consuella Lose, MD as Consulting Physician (Neurosurgery) Kerin Perna., MD as Consulting Physician (Neurology)    Assessment:    Physical assessment deferred to PCP.  Exercise Activities and Dietary recommendations Current Exercise Habits: The patient does not participate in regular exercise at present, Exercise limited by: None identified   Diet (meal preparation, eat out,  water intake, caffeinated beverages, dairy products, fruits and vegetables): 24 hour recall Breakfast: english muffin w/ jelly Lunch: pimento cheese sandwich Dinner:yogurt Pt drinks sprite zero and unsweet tea. Pt states she will try to drink more water.       Goal : Maintain current health and independence. Fall Risk Fall Risk  12/19/2016 12/02/2015 07/07/2014  Falls in the past year? No No No   Depression Screen PHQ 2/9 Scores 12/19/2016 12/02/2015 07/07/2014  PHQ - 2 Score 0 0 0     Cognitive Function MMSE - Mini Mental State  Exam 12/19/2016 12/02/2015  Orientation to time 5 5  Orientation to Place 5 5  Registration 3 3  Attention/ Calculation 5 5  Recall 3 3  Language- name 2 objects 2 2  Language- repeat 1 1  Language- follow 3 step command 3 3  Language- read & follow direction 1 1  Write a sentence 1 1  Copy design 1 1  Total score 30 30        Immunization History  Administered Date(s) Administered  . Influenza, High Dose Seasonal PF 10/08/2014, 12/02/2015, 11/17/2016  . Pneumococcal Conjugate-13 11/27/2013  . Pneumococcal Polysaccharide-23 12/02/2015  . Tdap 12/07/2011  . Zoster 03/18/2008   Screening Tests Health Maintenance  Topic Date Due  . TETANUS/TDAP  12/06/2021  . INFLUENZA VACCINE  Completed  . DEXA SCAN  Completed  . PNA vac Low Risk Adult  Addressed      Plan:   Follow up with Dr.Blyth as scheduled 06/14/17.  Continue to eat heart healthy diet (full of fruits, vegetables, whole grains, lean protein, water--limit salt, fat, and sugar intake) and increase physical activity as tolerated.  Continue doing brain stimulating activities (puzzles, reading, adult coloring books, staying active) to keep memory sharp.   Continue taking Coumadin 5 mg daily, except on Tuesday and Saturday take 2.5 mg. Return for INR check in 2 weeks per Dr.Blyth   I have personally reviewed and noted the following in the patient's chart:   . Medical and social  history . Use of alcohol, tobacco or illicit drugs  . Current medications and supplements . Functional ability and status . Nutritional status . Physical activity . Advanced directives . List of other physicians . Hospitalizations, surgeries, and ER visits in previous 12 months . Vitals . Screenings to include cognitive, depression, and falls . Referrals and appointments  In addition, I have reviewed and discussed with patient certain preventive protocols, quality metrics, and best practice recommendations. A written personalized care plan for preventive services as well as general preventive health recommendations were provided to patient.     Shela Nevin, [place]  12/19/2016

## 2016-12-21 ENCOUNTER — Ambulatory Visit: Payer: Medicare Other | Admitting: Allergy and Immunology

## 2016-12-27 DIAGNOSIS — H353131 Nonexudative age-related macular degeneration, bilateral, early dry stage: Secondary | ICD-10-CM | POA: Diagnosis not present

## 2016-12-27 DIAGNOSIS — H0100A Unspecified blepharitis right eye, upper and lower eyelids: Secondary | ICD-10-CM | POA: Diagnosis not present

## 2016-12-27 DIAGNOSIS — H0100B Unspecified blepharitis left eye, upper and lower eyelids: Secondary | ICD-10-CM | POA: Diagnosis not present

## 2016-12-27 DIAGNOSIS — H52203 Unspecified astigmatism, bilateral: Secondary | ICD-10-CM | POA: Diagnosis not present

## 2016-12-27 DIAGNOSIS — H524 Presbyopia: Secondary | ICD-10-CM | POA: Diagnosis not present

## 2017-01-04 ENCOUNTER — Other Ambulatory Visit: Payer: Self-pay

## 2017-01-04 ENCOUNTER — Ambulatory Visit (INDEPENDENT_AMBULATORY_CARE_PROVIDER_SITE_OTHER): Payer: Medicare Other

## 2017-01-04 DIAGNOSIS — Z86718 Personal history of other venous thrombosis and embolism: Secondary | ICD-10-CM | POA: Diagnosis not present

## 2017-01-04 LAB — POCT INR: INR: 3

## 2017-01-04 MED ORDER — VALACYCLOVIR HCL 500 MG PO TABS: 500.0000 mg | ORAL_TABLET | Freq: Two times a day (BID) | ORAL | 0 refills | Status: DC

## 2017-01-04 NOTE — Progress Notes (Signed)
Nurse INR check note reviewed. Agree with documention and plan.

## 2017-01-04 NOTE — Progress Notes (Signed)
Pre visit review using our clinic tool,if applicable. No additional management support is needed unless otherwise documented below in the visit note.   Patient in for INR check per order from Dr. Frederik Pear.  Patient s last INR = 2.0 Goal is 2.0-3.0  Patient taking Coumadin 5mg  all days except Tuesday and Saturday she is taking 2.5 mg.  Patient complains of discomfort of Mouth,Tongue and Esophagus. Currently taking Valcyclovir buthas run out. States she would like another Rx called in to pharmacy.  INR today = 3.0   Per Dr. Charlett Blake patient to continue taking Coumadin as she currently is and return for INR check in 2 weeks. Appointment scheduled for patient.Refill for Valcyclovir approved by Dr. Charlett Blake.

## 2017-01-18 ENCOUNTER — Ambulatory Visit: Payer: Medicare Other

## 2017-01-22 ENCOUNTER — Telehealth: Payer: Self-pay | Admitting: Family Medicine

## 2017-01-22 NOTE — Telephone Encounter (Signed)
Try Famcyclovir 500 mg tabs, 1 tab po bid x 7 days

## 2017-01-22 NOTE — Telephone Encounter (Signed)
See attached note to Dr. Charlett Blake

## 2017-01-22 NOTE — Telephone Encounter (Signed)
Please advise 

## 2017-01-22 NOTE — Telephone Encounter (Signed)
Copied from [place] [id number]. Topic: Quick Communication - See Telephone Encounter >> Jan 22, 2017  2:30 PM Hewitt Shorts wrote: CRM for notification. See Telephone encounter for: pt is stating that she is on the third round of valacyclovir for the infection in her mouth and was told that Charlett Blake will change the meds now best number [phone]  01/22/17.

## 2017-01-24 ENCOUNTER — Telehealth: Payer: Self-pay | Admitting: Family Medicine

## 2017-01-24 MED ORDER — FAMCICLOVIR 500 MG PO TABS
500.0000 mg | ORAL_TABLET | Freq: Two times a day (BID) | ORAL | 0 refills | Status: DC
Start: 2017-01-24 — End: 2017-02-14

## 2017-01-24 NOTE — Telephone Encounter (Signed)
Copied from [place] [phone] [phone]. Topic: Quick Communication - See Telephone Encounter >> Jan 22, 2017  2:30 PM Hewitt Shorts wrote: CRM for notification. See Telephone encounter for: pt is stating that she is on the third round of valacyclovir for the infection in her mouth and was told that Charlett Blake will change the meds now best number [phone]  01/22/17. >> Jan 23, 2017  4:18 PM Boyd Kerbs wrote: Inez Catalina called [facility] and they did not get the prescription . Please resend .

## 2017-01-24 NOTE — Telephone Encounter (Signed)
Rx sent 

## 2017-01-24 NOTE — Telephone Encounter (Signed)
Will route to [facility] pool.  

## 2017-01-24 NOTE — Telephone Encounter (Signed)
Mosie Lukes, MD  to Mikayla [name], [place]     01/22/17 10:07 PM  Note    Try Famcyclovir 500 mg tabs, 1 tab po bid x 7 days

## 2017-01-24 NOTE — Addendum Note (Signed)
Addended byDamita Dunnings D on: 01/24/2017 08:06 AM   Modules accepted: Orders

## 2017-01-25 ENCOUNTER — Ambulatory Visit: Payer: Medicare Other

## 2017-02-01 ENCOUNTER — Ambulatory Visit: Payer: Medicare Other

## 2017-02-04 ENCOUNTER — Other Ambulatory Visit: Payer: Self-pay | Admitting: Family Medicine

## 2017-02-14 ENCOUNTER — Other Ambulatory Visit: Payer: Self-pay | Admitting: Family Medicine

## 2017-03-01 ENCOUNTER — Ambulatory Visit (INDEPENDENT_AMBULATORY_CARE_PROVIDER_SITE_OTHER): Payer: Medicare Other

## 2017-03-01 DIAGNOSIS — Z86718 Personal history of other venous thrombosis and embolism: Secondary | ICD-10-CM

## 2017-03-01 DIAGNOSIS — Z7901 Long term (current) use of anticoagulants: Secondary | ICD-10-CM

## 2017-03-01 LAB — POCT INR: INR: 2.5

## 2017-03-01 NOTE — Progress Notes (Signed)
Pre visit review using our clinic tool,if applicable. No additional management support is needed unless otherwise documented below in the visit note.   Patient in for INR check per order from Dr. Charlett Blake.  INR last visit = 3.0   Goal 2.0-3.0  Patient findings negative.  Currently taking Coumadin 5 mg all days except Tuesdays and Saturdays she takes 2.5 mg    INR today = 2.5  Per Dr. Charlett Blake, patient to continue previous dose of Coumadin and return for INR check in 1 month. Appointment scheduled.

## 2017-03-01 NOTE — Patient Instructions (Signed)
Patient to continue previous dosage and return in 1 month for INR check.   Appointment scheduled.

## 2017-03-09 ENCOUNTER — Telehealth: Payer: Self-pay | Admitting: Family Medicine

## 2017-03-12 NOTE — Telephone Encounter (Signed)
Mikayla [name] would like to know if the metoprolol tartrate (LOPRESSOR) 25 MG tablet could be for 180 tablets instead of 66 tablets. Please advise Call back is [phone]

## 2017-03-13 ENCOUNTER — Other Ambulatory Visit: Payer: Self-pay

## 2017-03-13 ENCOUNTER — Other Ambulatory Visit: Payer: Self-pay | Admitting: Family Medicine

## 2017-03-13 MED ORDER — METOPROLOL TARTRATE 25 MG PO TABS: ORAL_TABLET | ORAL | 0 refills | Status: DC

## 2017-03-13 NOTE — Telephone Encounter (Signed)
I sent over correct rx

## 2017-03-26 ENCOUNTER — Other Ambulatory Visit: Payer: Self-pay | Admitting: Family Medicine

## 2017-03-29 ENCOUNTER — Ambulatory Visit (INDEPENDENT_AMBULATORY_CARE_PROVIDER_SITE_OTHER): Payer: Medicare Other

## 2017-03-29 DIAGNOSIS — Z7901 Long term (current) use of anticoagulants: Secondary | ICD-10-CM | POA: Diagnosis not present

## 2017-03-29 DIAGNOSIS — Z86718 Personal history of other venous thrombosis and embolism: Secondary | ICD-10-CM | POA: Diagnosis not present

## 2017-03-29 LAB — POCT INR: INR: 1.9

## 2017-03-29 NOTE — Progress Notes (Addendum)
Pre visit review using our clinic review tool, if applicable. No additional management support is needed unless otherwise documented below in the visit note.  Patient in for INR per PCP Dr. Charlett Blake  Last INR was 2.5  Patient findings negative  Goal 2.0- 3.0  INR today was 1.9  Nursing INR check note reviewed. Agree with documention and plan.

## 2017-03-29 NOTE — Patient Instructions (Signed)
Per Dr. Charlett Blake Continue previous 5 mg 5 days per week and on Tuesday and Saturday 2.5 mg . Patient to schedule RN appointment in one month to recheck PT/INR Patient given instructions.  Patient scheduled appoitnment

## 2017-04-26 ENCOUNTER — Ambulatory Visit (INDEPENDENT_AMBULATORY_CARE_PROVIDER_SITE_OTHER): Payer: Medicare Other

## 2017-04-26 DIAGNOSIS — Z7901 Long term (current) use of anticoagulants: Secondary | ICD-10-CM | POA: Diagnosis not present

## 2017-04-26 LAB — POCT INR: INR: 2.5

## 2017-04-26 NOTE — Progress Notes (Signed)
Currently taking Coumadin 5 mg all days except Tuesdays and Saturdays she takes 2.5 mg    Missed Doses: 0  Last Taking Coumadin this morning Goal 2.0-3.0  Today INR: 2.5    Advised to continue Coumadin 5 mg all days except Tuesdays and Saturdays she takes 2.5 mg  She has agreed and voiced her understanding

## 2017-05-07 DIAGNOSIS — H52203 Unspecified astigmatism, bilateral: Secondary | ICD-10-CM | POA: Diagnosis not present

## 2017-05-07 DIAGNOSIS — H0100A Unspecified blepharitis right eye, upper and lower eyelids: Secondary | ICD-10-CM | POA: Diagnosis not present

## 2017-05-07 DIAGNOSIS — H524 Presbyopia: Secondary | ICD-10-CM | POA: Diagnosis not present

## 2017-05-07 DIAGNOSIS — H353131 Nonexudative age-related macular degeneration, bilateral, early dry stage: Secondary | ICD-10-CM | POA: Diagnosis not present

## 2017-05-07 DIAGNOSIS — H0100B Unspecified blepharitis left eye, upper and lower eyelids: Secondary | ICD-10-CM | POA: Diagnosis not present

## 2017-05-08 DIAGNOSIS — I671 Cerebral aneurysm, nonruptured: Secondary | ICD-10-CM | POA: Diagnosis not present

## 2017-05-09 ENCOUNTER — Other Ambulatory Visit: Payer: Self-pay | Admitting: Family Medicine

## 2017-05-16 DIAGNOSIS — I671 Cerebral aneurysm, nonruptured: Secondary | ICD-10-CM | POA: Diagnosis not present

## 2017-05-16 DIAGNOSIS — Z6827 Body mass index (BMI) 27.0-27.9, adult: Secondary | ICD-10-CM | POA: Diagnosis not present

## 2017-05-31 ENCOUNTER — Ambulatory Visit (INDEPENDENT_AMBULATORY_CARE_PROVIDER_SITE_OTHER): Payer: Medicare Other

## 2017-05-31 DIAGNOSIS — Z7901 Long term (current) use of anticoagulants: Secondary | ICD-10-CM | POA: Diagnosis not present

## 2017-05-31 LAB — POCT INR: INR: 2.5

## 2017-05-31 NOTE — Patient Instructions (Addendum)
Coumadin 5 mg all days except Tuesdays and Saturdays she takes 2.5 mg     Warfarin Coagulopathy Warfarin (Coumadin) coagulopathy refers to bleeding that may occur as a complication of the medicine warfarin. Warfarin is an oral blood thinner (anticoagulant). Warfarin is used for medical conditions where thinning of the blood is needed to prevent blood clots. What are the causes? Bleeding is the most common and most serious complication of warfarin. The amount of bleeding is related to the warfarin dose and length of treatment. In addition, bleeding complications can also occur due to:  Intentional or accidental warfarin overdose.  Underlying medical conditions.  Dietary changes.  Medicine, herbal, supplement, or alcohol interactions.  What are the signs or symptoms? Severe bleeding while on warfarin may occur from any tissue or organ. Symptoms of the blood being too thin may include:  Bleeding from the nose or gums.  Blood in bowel movements which may appear as bright red, dark, or black tarry stools.  Blood in the urine which may appear as pink, red, or brown urine.  Unusual bruising or bruising easily.  A cut that does not stop bleeding within 10 minutes.  Vomiting blood or continuous nausea for more than 1 day.  Coughing up blood.  Broken blood vessels in your eye (subconjunctival hemorrhage).  Abdominal or back pain with or without flank bruising.  Sudden, severe headache.  Sudden weakness or numbness of the face, arm, or leg, especially on one side of the body.  Sudden confusion.  Trouble speaking (aphasia) or understanding.  Sudden trouble seeing in one or both eyes.  Sudden trouble walking.  Dizziness.  Loss of balance or coordination.  Vaginal bleeding.  Swelling or pain at an injection site.  Superficial fat tissue death (necrosis) which may cause skin scarring. This is more common in women and may first present as pain in the waist, thighs, or  buttocks.  Follow these instructions at home:  Always contact your health care provider of any concerns or signs of possible warfarin coagulopathy as soon as possible.  Take warfarin exactly as directed by your health care provider. It is recommended that you take your warfarin dose at the same time of the day. If you have been told to stop taking warfarin, do not resume taking warfarin until directed to do so by your health care provider. Follow your health care provider's instructions if you accidentally take an extra dose or miss a dose of warfarin. It is very important to take warfarin as directed since bleeding or blood clots could result in chronic or permanent injury, pain, or disability.  Keep all follow-up appointments with your health care provider as directed. It is very important to keep your appointments. Not keeping appointments could result in a chronic or permanent injury, pain, or disability because warfarin is a medicine that requires close monitoring.  While taking warfarin, you will need to have regular blood tests to measure your blood clotting time. These blood tests usually include both the prothrombin time (PT) and International Normalized Ratio (INR) tests. The PT and INR results allow your health care provider to adjust your dose of warfarin. The dose can change for many reasons. It is critically important that you have your PT and INR levels drawn exactly as directed. Your warfarin dose may stay the same or change depending on what the PT and INR results are. Be sure to follow up with your health care provider regarding your PT and INR test results and what your  warfarin dosage should be.  Many medicines can interfere with warfarin and affect the PT and INR results. You must tell your health care provider about any and all medicines you take. This includes all vitamins and supplements. Ask your health care provider before taking these. Prescription and over-the-counter medicine  consistency is critical to warfarin management. It is important that potential interactions are checked before you start a new medicine. Be especially cautious with aspirin and anti-inflammatory medicines. Ask your health care provider before taking these. Medicines such as antibiotics and acid-reducing medicine can interact with warfarin and can cause an increased warfarin effect. Warfarin can also interfere with the effectiveness of medicines you are taking. Do not take or discontinue any prescribed or over-the-counter medicine except on the advice of your health care provider or pharmacist.  Some vitamins, supplements, and herbal products interfere with the effectiveness of warfarin. Vitamin E may increase the anticoagulant effects of warfarin. Vitamin K can cause warfarin to be less effective. Do not take or discontinue any vitamin, supplement, or herbal product except on the advice of your health care provider or pharmacist.  Eat what you normally eat and keep the vitamin K content of your diet consistent. Avoid major changes in your diet, or notify your health care provider before changing your diet. Suddenly getting a lot more vitamin K could cause your blood to clot too quickly. A sudden decrease in vitamin K intake could cause your blood to clot too slowly. These changes in vitamin K intake could lead to dangerous blood clotsor to bleeding. To keep your vitamin K intake consistent, you must be aware of which foods contain moderate or high amounts of vitamin K. Some foods that are high in vitamin K include spinach, kale, broccoli, cabbage, greens, Brussels sprouts, asparagus, bok choy, coleslaw, and parsley. If you drink green tea, drink the same amount each day. Arrange a visit with a dietitian to answer your questions.  If you have a loss of appetite or get the stomach flu (viral gastroenteritis), talk to your health care provider as soon as possible. A decrease in your normal vitamin K intake can  make you more sensitive to your usual dose of warfarin.  Some medical conditions may increase your risk for bleeding while you are taking warfarin. A fever, diarrhea lasting more than a day, worsening heart failure, or worsening liver function are some medical conditions that could affect warfarin. Contact your health care provider if you have any of these medical conditions.  Be careful not to cut yourself when using sharp objects or while shaving.  Alcohol can change the body's ability to handle warfarin. It is best to avoid alcoholic drinks or consume only very small amounts while taking warfarin. Notify your health care provider if you change your alcohol intake. A sudden increase in alcohol use can increase your risk of bleeding. Chronic alcohol use can cause warfarin to be less effective.  Limit physical activities or sports that could result in a fall or cause injury.  Do not use warfarin if you are pregnant.  Inform all your health care providers and your dentist that you take warfarin.  Inform all health care providers if you are taking warfarin and aspirin or platelet inhibitor medicines such as clopidogrel, ticagrelor, or prasugrel. Use of these medicines in addition to warfarin can increase your risk of bleeding or death. Taking these medicines together should only be done under the direct care of your health care providers. Get help right away if:  You cough up blood.  You have dark or black stools or there is bright red blood coming from your rectum.  You vomit blood or have nausea for more than 1 day.  You have blood in the urine or pink-colored urine.  You have unusual bruising or have increased bruising.  You have bleeding from the nose or gums that does not stop quickly.  You have a cut that does not stop bleeding within 2-3 minutes.  You have sudden weakness or numbness of the face, arm, or leg, especially on one side of the body.  You have sudden confusion.  You  have trouble speaking (aphasia) or understanding.  You have sudden trouble seeing in one or both eyes.  You have sudden trouble walking.  You have dizziness.  You have a loss of balance or coordination.  You have a sudden, severe headache.  You have a serious fall or head injury, even if you are not bleeding.  You have swelling or pain at an injection site.  You have unexplained tenderness or pain in the abdomen, back, waist, thighs, or buttocks. Any of these symptoms may represent a serious problem that is an emergency. Do not wait to see if the symptoms will go away. Get medical help right away. Call your local emergency services (911 in [place] Do not drive yourself to the hospital. This information is not intended to replace advice given to you by your health care provider. Make sure you discuss any questions you have with your health care provider. Document Released: 01/01/2006 Document Revised: 07/01/2015 Document Reviewed: 07/04/2011 Elsevier Interactive Patient Education  [employer].

## 2017-05-31 NOTE — Progress Notes (Signed)
Currently taking Coumadin 5 mg all days except Tuesdays and Saturdays she takes 2.5 mg   Last dose: Today  Missed Doses: None   Goal: 2.0-3.5  Last INR 2.5   Today INR: 2.5   Per Dr. Nani Ravens (DOD) to return in 4 weeks. Patient states she will recheck with PCP in the next 2 weeks

## 2017-06-07 ENCOUNTER — Other Ambulatory Visit: Payer: Self-pay | Admitting: Family Medicine

## 2017-06-11 NOTE — Telephone Encounter (Signed)
Mikayla [name] Rx request refused due to med not filled since 12/2016. Pt scheduled for OV with Dr. Charlett Blake 5/9. Dr. Charlett Blake to clarify before or during visit.

## 2017-06-11 NOTE — Telephone Encounter (Signed)
We need to clarify with patient if she has been taking the [place] because she has she should not just stop it. If she has not we should see her to decide what to do. If she is not sure she should bring in her bag of meds and we can review her bottles

## 2017-06-12 ENCOUNTER — Telehealth: Payer: Self-pay

## 2017-06-12 MED ORDER — DILTIAZEM HCL ER COATED BEADS 240 MG PO CP24: 240.0000 mg | ORAL_CAPSULE | Freq: Every day | ORAL | 0 refills | Status: DC

## 2017-06-12 NOTE — Addendum Note (Signed)
Addended by: Raynelle Dick R on: 06/12/2017 12:09 PM   Modules accepted: Orders

## 2017-06-12 NOTE — Telephone Encounter (Signed)
Pt states she has been taking this  diltiazem (CARTIA XT) 240 MG 24 hr capsule for years, and Dr Charlett Blake has been filling a 90 day rx all along. Last picked up a 90 day in Feb  Pt has appt 5/09. Pt needs this Rx today, because pt is out at:  [place], [place] - [address] [phone] (Phone) [phone] (Fax)

## 2017-06-12 NOTE — Telephone Encounter (Signed)
Author spoke with patient regarding cartia Rx, and pt. Confirmed that she does still take it daily, despite previous fill order seen as 12/2016. 30 day supply ordered until pt. Sees Dr. Charlett Blake on 5/9 for Dr. Charlett Blake to approve additional supply if warranted. Patient made aware, and reminded of upcoming appointment.

## 2017-06-14 ENCOUNTER — Encounter: Payer: Self-pay | Admitting: Family Medicine

## 2017-06-14 ENCOUNTER — Ambulatory Visit (INDEPENDENT_AMBULATORY_CARE_PROVIDER_SITE_OTHER): Payer: Medicare Other | Admitting: Family Medicine

## 2017-06-14 VITALS — BP 124/61 | HR 62 | Temp 98.0°F | Resp 16 | Ht 64.96 in | Wt 171.8 lb

## 2017-06-14 DIAGNOSIS — I10 Essential (primary) hypertension: Secondary | ICD-10-CM | POA: Diagnosis not present

## 2017-06-14 DIAGNOSIS — R8290 Unspecified abnormal findings in urine: Secondary | ICD-10-CM

## 2017-06-14 DIAGNOSIS — E785 Hyperlipidemia, unspecified: Secondary | ICD-10-CM

## 2017-06-14 DIAGNOSIS — R6883 Chills (without fever): Secondary | ICD-10-CM | POA: Diagnosis not present

## 2017-06-14 DIAGNOSIS — R002 Palpitations: Secondary | ICD-10-CM

## 2017-06-14 LAB — URINALYSIS, ROUTINE W REFLEX MICROSCOPIC
Bilirubin Urine: NEGATIVE
HGB URINE DIPSTICK: NEGATIVE
Ketones, ur: NEGATIVE
LEUKOCYTES UA: NEGATIVE
Nitrite: POSITIVE — AB
RBC / HPF: NONE SEEN (ref 0–?)
Specific Gravity, Urine: 1.01 (ref 1.000–1.030)
Total Protein, Urine: NEGATIVE
URINE GLUCOSE: NEGATIVE
Urobilinogen, UA: 0.2 (ref 0.0–1.0)
pH: 7.5 (ref 5.0–8.0)

## 2017-06-14 NOTE — Progress Notes (Signed)
Subjective:  I acted as a Education administrator for [employer]. Mikayla [name], [place]   Patient ID: Mikayla [name], female    DOB: [birth year], 77 y.o.   MRN: [id number]  Chief Complaint  Patient presents with  . Follow-up    HPI  Patient is in today for follow up visit. She has episodes roughly every 3 months of sudden onset nausea, burping, chills and tremors. They last hours then resolve but she is left feeling exhausted and sore. She also notes some increase in episodes of feeling dizzy no falls or trauma. Denies CP/palp/SOB/HA/congestion/fevers or GU c/o. Taking meds as prescribed  Patient Care Team: Mosie Lukes, MD as PCP - General (Family Medicine) Minus Breeding, MD as Consulting Physician (Cardiology) Linward Natal, MD as Referring Physician (Ophthalmology) Consuella Lose, MD as Consulting Physician (Neurosurgery) Kerin Perna., MD as Consulting Physician (Neurology)   Past Medical History:  Diagnosis Date  . Abdominal pain 08/25/2014  . Acute venous embolism and thrombosis of unspecified deep vessels of lower extremity    after broken foot screw left in place, left leg  . Anemia   . Aortic stenosis    echo 11/11: EF 60%, mild LVH, mod/severe AS with mean 32 mmHg, peak 55 mmHg, mild AI, mild MR, (previous echo 2010 with mean gradient 29 mmHg)  . Atrial tachycardia ([place])   . Bilateral dry eyes   . Brain aneurysm 01/16/2015  . Carotid stenosis    dopplers 11/11: 0-39% bilat  . Dehydration 08/25/2014  . Dermatitis contact, eyelid 04/16/2016  . Dyslipidemia   . GERD (gastroesophageal reflux disease)   . H/O measles   . History of chicken pox   . Hyperlipidemia   . Hyperlipidemia, mild 10/08/2014  . Macular degeneration    Dr.DAbanzo  . Osteopenia 10/08/2014  . Other congenital anomaly of toes   . Pain in joint, pelvic region and thigh   . Palpitations    PACs and atrial tachy  . Peripheral edema 08/25/2014  . Thrush     Past Surgical History:  Procedure Laterality Date  .  CHOLECYSTECTOMY    . FOOT SURGERY    . hyroglossal duct cyst resection    . LEFT AND RIGHT HEART CATHETERIZATION WITH CORONARY ANGIOGRAM N/A 03/21/2013   Procedure: LEFT AND RIGHT HEART CATHETERIZATION WITH CORONARY ANGIOGRAM;  Surgeon: Minus Breeding, MD;  Location: [facility] C[facility] LAB;  Service: Cardiovascular;  Laterality: N/A;  . left hip replacement    . RADICAL HYSTERECTOMY    . TONSILLECTOMY      Family History  Problem Relation Age of Onset  . Heart disease Mother   . Hypertension Mother 36  . Heart disease Father 19  . Cancer Maternal Aunt        Breast  . Heart disease Maternal Grandmother   . Heart disease Maternal Grandfather   . Hernia Daughter   . Gallstones Daughter     Social History   Socioeconomic History  . Marital status: Divorced    Spouse name: Not on file  . Number of children: Not on file  . Years of education: Not on file  . Highest education level: Not on file  Occupational History  . Not on file  Social Needs  . Financial resource strain: Not on file  . Food insecurity:    Worry: Not on file    Inability: Not on file  . Transportation needs:    Medical: Not on file    Non-medical: Not on file  Tobacco Use  .  Smoking status: Never Smoker  . Smokeless tobacco: Never Used  Substance and Sexual Activity  . Alcohol use: No  . Drug use: No  . 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 Physical activity:    Days per week: Not on file    Minutes per session: Not on file  . Stress: Not on file  Relationships  . Social connections:    Talks on phone: Not on file    Gets together: Not on file    Attends religious service: Not on file    Active member of club or organization: Not on file    Attends meetings of clubs or organizations: Not on file    Relationship status: Not on file  . Intimate partner violence:    Fear of current or ex partner: Not on file    Emotionally abused:  Not on file    Physically abused: Not on file    Forced sexual activity: Not on file  Other Topics Concern  . Not on file  Social History Narrative  . Not on file    Outpatient Medications Prior to Visit  Medication Sig Dispense Refill  . acetaminophen (TYLENOL) 500 MG tablet Take 1 tablet by mouth every 8 (eight) hours as needed for mild pain or headache.     . calcium citrate (CALCITRATE - DOSED IN MG ELEMENTAL CALCIUM) 950 MG tablet Take 200 mg of elemental calcium by mouth daily.    Marland Kitchen diltiazem (CARTIA XT) 240 MG 24 hr capsule Take 1 capsule (240 mg total) by mouth daily. 30 capsule 0  . famciclovir (FAMVIR) 500 MG tablet TAKE ONE TABLET BY MOUTH TWO TIMES A DAY FOR 7 DAYS 14 tablet 0  . furosemide (LASIX) 20 MG tablet TAKE ONE TABLET BY MOUTH DAILY AS NEEDED WEIGHT GAIN > 3LB IN 24 HOURS  OR INCREASED EDEMA 45 tablet 0  . gabapentin (NEURONTIN) 800 MG tablet Take 1 tablet (800 mg total) by mouth 2 (two) times daily. 90 tablet 1  . hyoscyamine (LEVSIN SL) 0.125 MG SL tablet Place 1 tablet (0.125 mg total) under the tongue every 4 (four) hours as needed. 30 tablet 0  . lidocaine (XYLOCAINE) 2 % solution 3-5 ml po tid swish and spit 100 mL 0  . metoprolol tartrate (LOPRESSOR) 25 MG tablet TAKE 1 TABLET BY MOUTH 2 TIMES DAILY 180 tablet 0  . Multiple Vitamin (MULTIVITAMIN) tablet Take 1 tablet by mouth daily.      . multivitamin-lutein (OCUVITE-LUTEIN) CAPS capsule Take 1 capsule by mouth daily.    . Omega 3 1000 MG CAPS Take 1,000 mg by mouth daily. Reported on 03/24/2015    . omeprazole (PRILOSEC) 40 MG capsule TAKE ONE CAPSULE BY MOUTH ONCE DAILY 90 capsule 5  . Polyethyl Glycol-Propyl Glycol (SYSTANE OP) Apply 1 drop to eye 2 (two) times daily as needed. For dry eyes    . Probiotic Product (PHILLIPS COLON HEALTH PO) Take 1 capsule by mouth daily.    . traMADol (ULTRAM) 50 MG tablet Take 1 tablet (50 mg total) by mouth every 8 (eight) hours as needed. 40 tablet 0  . warfarin (COUMADIN)  5 MG tablet TAKE DAILY AS DIRECTED BY COUMADIN CLINIC 90 tablet 2  . valACYclovir (VALTREX) 500 MG tablet Take 1 tablet (500 mg total) by mouth 2 (two) times daily. 10 tablet 0   No facility-administered medications prior to visit.  Allergies  Allergen Reactions  . Indomethacin Nausea And Vomiting    Caused chest pain.  . Codeine Nausea And Vomiting    Review of Systems  Constitutional: Positive for chills. Negative for fever and malaise/fatigue.  HENT: Negative for congestion.   Eyes: Negative for blurred vision.  Respiratory: Negative for shortness of breath.   Cardiovascular: Negative for chest pain, palpitations and leg swelling.  Gastrointestinal: Positive for heartburn, nausea and vomiting. Negative for abdominal pain and blood in stool.  Genitourinary: Negative for dysuria and frequency.  Musculoskeletal: Negative for falls.  Skin: Negative for rash.  Neurological: Positive for dizziness. Negative for loss of consciousness and headaches.  Endo/Heme/Allergies: Negative for environmental allergies.  Psychiatric/Behavioral: Negative for depression. The patient is not nervous/anxious.        Objective:    Physical Exam  Constitutional: No distress.  HENT:  Left Ear: External ear normal.  Mouth/Throat: No oropharyngeal exudate.  Eyes: EOM are normal. Left eye exhibits no discharge. No scleral icterus.  Neck: No JVD present. No tracheal deviation present.  Cardiovascular: Normal heart sounds and intact distal pulses.  Pulmonary/Chest: No respiratory distress. She has no rales.  Abdominal: She exhibits no distension and no mass. There is no tenderness. There is no guarding.  Musculoskeletal: She exhibits no edema or tenderness.  Lymphadenopathy:    She has no cervical adenopathy.  Skin: No rash noted. No erythema.    BP 124/61 (BP Location: Left Arm, Patient Position: Sitting, Cuff Size: Normal)   Pulse 62   Temp 98 F (36.7 C) (Oral)   Resp 16   Ht 5' 4.96"  (1.65 m)   Wt 171 lb 12.8 oz (77.9 kg)   SpO2 100%   BMI 28.62 kg/m  Wt Readings from Last 3 Encounters:  06/14/17 171 lb 12.8 oz (77.9 kg)  12/19/16 173 lb 6.4 oz (78.7 kg)  12/14/16 174 lb 9.6 oz (79.2 kg)   BP Readings from Last 3 Encounters:  06/14/17 124/61  12/19/16 134/80  12/14/16 132/74     Immunization History  Administered Date(s) Administered  . Influenza, High Dose Seasonal PF 10/08/2014, 12/02/2015, 11/17/2016  . Pneumococcal Conjugate-13 11/27/2013  . Pneumococcal Polysaccharide-23 12/02/2015  . Tdap 12/07/2011  . Zoster 03/18/2008    Health Maintenance  Topic Date Due  . INFLUENZA VACCINE  09/06/2017  . TETANUS/TDAP  12/06/2021  . DEXA SCAN  Completed  . PNA vac Low Risk Adult  Addressed    Lab Results  Component Value Date   WBC 7.0 11/27/2016   HGB 13.4 11/27/2016   HCT 41.0 11/27/2016   PLT 246.0 11/27/2016   GLUCOSE 89 12/14/2016   CHOL 194 04/13/2016   TRIG 66.0 04/13/2016   HDL 60.70 04/13/2016   LDLCALC 120 (H) 04/13/2016   ALT 15 12/14/2016   AST 18 12/14/2016   NA 140 12/14/2016   K 4.8 12/14/2016   CL 103 12/14/2016   CREATININE 0.62 12/14/2016   BUN 15 12/14/2016   CO2 32 12/14/2016   TSH 1.68 12/14/2016   INR 2.5 05/31/2017    Lab Results  Component Value Date   TSH 1.68 12/14/2016   Lab Results  Component Value Date   WBC 7.0 11/27/2016   HGB 13.4 11/27/2016   HCT 41.0 11/27/2016   MCV 93.2 11/27/2016   PLT 246.0 11/27/2016   Lab Results  Component Value Date   NA 140 12/14/2016   K 4.8 12/14/2016   CO2 32 12/14/2016   GLUCOSE 89 12/14/2016  BUN 15 12/14/2016   CREATININE 0.62 12/14/2016   BILITOT 0.4 12/14/2016   ALKPHOS 65 12/14/2016   AST 18 12/14/2016   ALT 15 12/14/2016   PROT 7.2 12/14/2016   ALBUMIN 4.0 12/14/2016   CALCIUM 9.6 12/14/2016   ANIONGAP 9 01/15/2015   GFR 98.90 12/14/2016   Lab Results  Component Value Date   CHOL 194 04/13/2016   Lab Results  Component Value Date   HDL 60.70  04/13/2016   Lab Results  Component Value Date   LDLCALC 120 (H) 04/13/2016   Lab Results  Component Value Date   TRIG 66.0 04/13/2016   Lab Results  Component Value Date   CHOLHDL 3 04/13/2016   No results found for: HGBA1C       Assessment & Plan:   Problem List Items Addressed This Visit    Palpitations    No recent flares occurs randomly with no associated symptoms      Benign essential HTN    Well controlled, no changes to meds. Encouraged heart healthy diet such as the DASH diet and exercise as tolerated.       Hyperlipidemia, mild    Encouraged heart healthy diet, increase exercise, avoid trans fats, consider a krill oil cap daily      Chills (without fever)    She has episodes roughly every 3 months of sudden onset nausea, burping, chills and tremors. They last hours then resolve but she is left feeling exhausted and sore. She also notes some increase in episodes of feeling dizzy no falls or trauma. Check labs including hgba1c. Call if new or more concerning symptoms develop.        Other Visit Diagnoses    Malodorous urine    -  Primary   Relevant Orders   Urinalysis   Urine Culture   Urine Culture (Completed)   Urinalysis   Chills       Relevant Orders   Urinalysis      I have discontinued Nikesha Gordy's valACYclovir. I am also having her start on cefdinir. Additionally, I am having her maintain her multivitamin, Omega 3, Polyethyl Glycol-Propyl Glycol (SYSTANE OP), acetaminophen, calcium citrate, traMADol, Probiotic Product (PHILLIPS COLON HEALTH PO), hyoscyamine, multivitamin-lutein, omeprazole, lidocaine, furosemide, gabapentin, famciclovir, warfarin, metoprolol tartrate, and diltiazem.  Meds ordered this encounter  Medications  . cefdinir (OMNICEF) 300 MG capsule    Sig: Take 1 capsule (300 mg total) by mouth 2 (two) times daily. For 5 days    Dispense:  10 capsule    Refill:  0    CMA served as scribe during this visit. History, Physical and  Plan performed by medical provider. Documentation and orders reviewed and attested to.  Penni Homans, MD

## 2017-06-14 NOTE — Assessment & Plan Note (Signed)
No recent flares occurs randomly with no associated symptoms

## 2017-06-14 NOTE — Patient Instructions (Signed)
[phone] can call to let us know you are bringing in a urine sample and confirm lab is open Urinary Tract Infection, Adult A urinary tract infection (UTI) is an infection of any part of the urinary tract, which includes the kidneys, ureters, bladder, and urethra. These organs make, store, and get rid of urine in the body. UTI can be a bladder infection (cystitis) or kidney infection (pyelonephritis). What are the causes? This infection may be caused by fungi, viruses, or bacteria. Bacteria are the most common cause of UTIs. This condition can also be caused by repeated incomplete emptying of the bladder during urination. What increases the risk? This condition is more likely to develop if:  You ignore your need to urinate or hold urine for long periods of time.  You do not empty your bladder completely during urination.  You wipe back to front after urinating or having a bowel movement, if you are female.  You are uncircumcised, if you are female.  You are constipated.  You have a urinary catheter that stays in place (indwelling).  You have a weak defense (immune) system.  You have a medical condition that affects your bowels, kidneys, or bladder.  You have diabetes.  You take antibiotic medicines frequently or for long periods of time, and the antibiotics no longer work well against certain types of infections (antibiotic resistance).  You take medicines that irritate your urinary tract.  You are exposed to chemicals that irritate your urinary tract.  You are female.  What are the signs or symptoms? Symptoms of this condition include:  Fever.  Frequent urination or passing small amounts of urine frequently.  Needing to urinate urgently.  Pain or burning with urination.  Urine that smells bad or unusual.  Cloudy urine.  Pain in the lower abdomen or back.  Trouble urinating.  Blood in the urine.  Vomiting or being less hungry than normal.  Diarrhea or  abdominal pain.  Vaginal discharge, if you are female.  How is this diagnosed? This condition is diagnosed with a medical history and physical exam. You will also need to provide a urine sample to test your urine. Other tests may be done, including:  Blood tests.  Sexually transmitted disease (STD) testing.  If you have had more than one UTI, a cystoscopy or imaging studies may be done to determine the cause of the infections. How is this treated? Treatment for this condition often includes a combination of two or more of the following:  Antibiotic medicine.  Other medicines to treat less common causes of UTI.  Over-the-counter medicines to treat pain.  Drinking enough water to stay hydrated.  Follow these instructions at home:  Take over-the-counter and prescription medicines only as told by your health care provider.  If you were prescribed an antibiotic, take it as told by your health care provider. Do not stop taking the antibiotic even if you start to feel better.  Avoid alcohol, caffeine, tea, and carbonated beverages. They can irritate your bladder.  Drink enough fluid to keep your urine clear or pale yellow.  Keep all follow-up visits as told by your health care provider. This is important.  Make sure to: ? Empty your bladder often and completely. Do not hold urine for long periods of time. ? Empty your bladder before and after sex. ? Wipe from front to back after a bowel movement if you are female. Use each tissue one time when you wipe. Contact a health care provider if:  You have back pain.  You have a fever.  You feel nauseous or vomit.  Your symptoms do not get better after 3 days.  Your symptoms go away and then return. Get help right away if:  You have severe back pain or lower abdominal pain.  You are vomiting and cannot keep down any medicines or water. This information is not intended to replace advice given to you by your health care provider.  Make sure you discuss any questions you have with your health care provider. Document Released: 11/02/2004 Document Revised: 07/07/2015 Document Reviewed: 12/14/2014 Elsevier Interactive Patient Education  [employer].

## 2017-06-14 NOTE — Assessment & Plan Note (Signed)
She has episodes roughly every 3 months of sudden onset nausea, burping, chills and tremors. They last hours then resolve but she is left feeling exhausted and sore. She also notes some increase in episodes of feeling dizzy no falls or trauma. Check labs including hgba1c. Call if new or more concerning symptoms develop.

## 2017-06-14 NOTE — Assessment & Plan Note (Signed)
Encouraged heart healthy diet, increase exercise, avoid trans fats, consider a krill oil cap daily 

## 2017-06-14 NOTE — Assessment & Plan Note (Signed)
Well controlled, no changes to meds. Encouraged heart healthy diet such as the DASH diet and exercise as tolerated.  °

## 2017-06-15 MED ORDER — CEFDINIR 300 MG PO CAPS: 300.0000 mg | ORAL_CAPSULE | Freq: Two times a day (BID) | ORAL | 0 refills | Status: DC

## 2017-06-16 LAB — URINE CULTURE
MICRO NUMBER:: 90567211
SPECIMEN QUALITY:: ADEQUATE

## 2017-06-28 ENCOUNTER — Ambulatory Visit (INDEPENDENT_AMBULATORY_CARE_PROVIDER_SITE_OTHER): Payer: Medicare Other

## 2017-06-28 DIAGNOSIS — Z86718 Personal history of other venous thrombosis and embolism: Secondary | ICD-10-CM

## 2017-06-28 LAB — POCT INR: INR: 2.3 (ref 2.0–3.0)

## 2017-06-28 NOTE — Progress Notes (Signed)
Pre visit review using our clinic tool,if applicable. No additional management support is needed unless otherwise documented below in the visit note.   Patient in for INR check per order dated 05/31/17.  No complaints voiced, no missed doses nor medication changes.  Last INR = 2.5   INR today = 2.3  Per Dr. Charlett Blake patient to continue taking Coumadin as ordered and return for re-Check of INR in 1 month. Patient agreed appointment scheduled.

## 2017-07-09 ENCOUNTER — Other Ambulatory Visit: Payer: Self-pay | Admitting: Family Medicine

## 2017-07-10 ENCOUNTER — Telehealth: Payer: Self-pay | Admitting: Family Medicine

## 2017-07-10 NOTE — Telephone Encounter (Signed)
Copied from [place] [id number]. Topic: Quick Communication - See Telephone Encounter >> Jul 10, 2017 11:59 AM Conception Chancy, NT wrote: CRM for notification. See Telephone encounter for: 07/10/17.  [facility] is calling and requesting 90 day refills for CARTIA XT 240 MG 24 hr capsule and needs 180 tablets for gabapentin (NEURONTIN) 800 MG tablet.   She states they received the 30 day supply but patient wants a 90 day supply.   [address] [place], [place] [address] Phone: [phone] Fax: [phone]

## 2017-07-10 NOTE — Telephone Encounter (Signed)
OK to refill 90 day supply with 1 rf of requested med

## 2017-07-11 ENCOUNTER — Other Ambulatory Visit: Payer: Self-pay

## 2017-07-11 MED ORDER — GABAPENTIN 800 MG PO TABS: 800.0000 mg | ORAL_TABLET | Freq: Two times a day (BID) | ORAL | 1 refills | Status: DC

## 2017-07-11 MED ORDER — DILTIAZEM HCL ER COATED BEADS 240 MG PO CP24: 240.0000 mg | ORAL_CAPSULE | Freq: Every day | ORAL | 0 refills | Status: DC

## 2017-07-11 NOTE — Telephone Encounter (Signed)
Requested medications refilled 

## 2017-07-24 ENCOUNTER — Other Ambulatory Visit: Payer: Self-pay | Admitting: Family Medicine

## 2017-07-26 ENCOUNTER — Ambulatory Visit (INDEPENDENT_AMBULATORY_CARE_PROVIDER_SITE_OTHER): Payer: Medicare Other

## 2017-07-26 DIAGNOSIS — Z86718 Personal history of other venous thrombosis and embolism: Secondary | ICD-10-CM

## 2017-07-26 LAB — POCT INR: INR: 2.1 (ref 2.0–3.0)

## 2017-07-26 MED ORDER — FAMCICLOVIR 500 MG PO TABS: ORAL_TABLET | ORAL | 1 refills | Status: DC

## 2017-07-26 NOTE — Progress Notes (Signed)
Pre visit review using our clinic tool,if applicable. No additional management support is needed unless otherwise documented below in the visit note.   Pt here for INR check per order from Dr. Charlett Blake dated 06/28/17  Goal INR =2.0-3.0  Last INR = 2.3  Pt currently takes Coumadin 2.5 mg on Tuesday and Saturday and 5 mg all other days  Pt denies recent antibiotics, no dietary changes and no unusual bruising / bleeding. States she has current shoulder pain for whihc she will schedule appointment and needs refill on Famciclovor 500 mg for blisters in her mouth.   INR today = 2.1  Pt advised per Dr.Blyth patient to continue medication as ordered and return for INR check in 1 month. Appointment scheduled for August 23 2017. Patient given appointment card. Per Dr. Charlett Blake ok to refill medication for mouth problem.

## 2017-08-20 ENCOUNTER — Ambulatory Visit: Payer: Medicare Other | Admitting: Family Medicine

## 2017-08-20 NOTE — Progress Notes (Signed)
HPI The patient presents for followup of aortic stenosis and  atrial fibrillation.  Since I last saw her she has now moved here to live with her daughter.  She is limited in her mobility and walks with a cane.  She does get some dizziness.  She denies any presyncope or syncope however.  She is not having any chest pressure, neck or arm discomfort.  She is not having any weight gain or edema.  She does have shortness of breath with activity such as walking to the grocery store which has been slightly progressive than previous.  She is not describing any palpitations, presyncope or syncope.  Allergies  Allergen Reactions  . Indomethacin Nausea And Vomiting    Caused chest pain.  . Codeine Nausea And Vomiting    Current Outpatient Medications  Medication Sig Dispense Refill  . acetaminophen (TYLENOL) 500 MG tablet Take 1 tablet by mouth every 8 (eight) hours as needed for mild pain or headache.     . calcium citrate (CALCITRATE - DOSED IN MG ELEMENTAL CALCIUM) 950 MG tablet Take 200 mg of elemental calcium by mouth daily.    Marland Kitchen diltiazem (CARTIA XT) 240 MG 24 hr capsule Take 1 capsule (240 mg total) by mouth daily. 90 capsule 0  . furosemide (LASIX) 20 MG tablet TAKE ONE TABLET BY MOUTH DAILY AS NEEDED WEIGHT GAIN > 3LB IN 24 HOURS  OR INCREASED EDEMA 45 tablet 0  . gabapentin (NEURONTIN) 800 MG tablet Take 1 tablet (800 mg total) by mouth 2 (two) times daily. 90 tablet 1  . metoprolol tartrate (LOPRESSOR) 25 MG tablet TAKE 1 TABLET BY MOUTH 2 TIMES DAILY 180 tablet 0  . Multiple Vitamin (MULTIVITAMIN) tablet Take 1 tablet by mouth daily.      . multivitamin-lutein (OCUVITE-LUTEIN) CAPS capsule Take 1 capsule by mouth daily.    . Omega 3 1000 MG CAPS Take 1,000 mg by mouth daily. Reported on 03/24/2015    . omeprazole (PRILOSEC) 40 MG capsule TAKE ONE CAPSULE BY MOUTH ONCE DAILY 90 capsule 5  . Polyethyl Glycol-Propyl Glycol (SYSTANE OP) Apply 1 drop to eye 2 (two) times daily as needed. For  dry eyes    . Probiotic Product (PHILLIPS COLON HEALTH PO) Take 1 capsule by mouth daily.    . traMADol (ULTRAM) 50 MG tablet Take 1 tablet (50 mg total) by mouth every 8 (eight) hours as needed. 40 tablet 0  . warfarin (COUMADIN) 5 MG tablet TAKE DAILY AS DIRECTED BY COUMADIN CLINIC 90 tablet 2  . famciclovir (FAMVIR) 500 MG tablet TAKE ONE TABLET BY MOUTH TWO TIMES A DAY FOR 7 DAYS 14 tablet 1  . hyoscyamine (LEVSIN SL) 0.125 MG SL tablet Place 1 tablet (0.125 mg total) under the tongue every 4 (four) hours as needed. (Patient not taking: Reported on 08/22/2017) 30 tablet 0   No current facility-administered medications for this visit.     Past Medical History:  Diagnosis Date  . Abdominal pain 08/25/2014  . Acute venous embolism and thrombosis of unspecified deep vessels of lower extremity    after broken foot screw left in place, left leg  . Anemia   . Aortic stenosis    echo 11/11: EF 60%, mild LVH, mod/severe AS with mean 32 mmHg, peak 55 mmHg, mild AI, mild MR, (previous echo 2010 with mean gradient 29 mmHg)  . Atrial tachycardia ([place])   . Bilateral dry eyes   . Brain aneurysm 01/16/2015  . Carotid stenosis  dopplers 11/11: 0-39% bilat  . Dehydration 08/25/2014  . Dermatitis contact, eyelid 04/16/2016  . Dyslipidemia   . GERD (gastroesophageal reflux disease)   . H/O measles   . History of chicken pox   . Hyperlipidemia   . Hyperlipidemia, mild 10/08/2014  . Macular degeneration    Mikayla [name]  . Osteopenia 10/08/2014  . Other congenital anomaly of toes   . Pain in joint, pelvic region and thigh   . Palpitations    PACs and atrial tachy  . Peripheral edema 08/25/2014  . Thrush     Past Surgical History:  Procedure Laterality Date  . CHOLECYSTECTOMY    . FOOT SURGERY    . hyroglossal duct cyst resection    . LEFT AND RIGHT HEART CATHETERIZATION WITH CORONARY ANGIOGRAM N/A 03/21/2013   Procedure: LEFT AND RIGHT HEART CATHETERIZATION WITH CORONARY ANGIOGRAM;  Surgeon: Mikayla Breeding, MD;  Location: [facility] CATH LAB;  Service: Cardiovascular;  Laterality: N/A;  . left hip replacement    . RADICAL HYSTERECTOMY    . TONSILLECTOMY      ROS:   As stated in the HPI and negative for all other systems.  PHYSICAL EXAM BP 120/68   Pulse (!) 50   Ht 5\' 4"  (1.626 m)   Wt 167 lb (75.8 kg)   BMI 28.67 kg/m   GENERAL:  Well appearing NECK:  No jugular venous distention, waveform within normal limits, carotid upstroke brisk and symmetric, no bruits, no thyromegaly LUNGS:  Clear to auscultation bilaterally CHEST:  Unremarkable HEART:  PMI not displaced or sustained,S1 and S2 within normal limits, no S3, no S4, no clicks, no rubs, 3 out of 6 apical systolic murmur late peaking, radiating up to the aortic outflow tract and into the carotids, no diastolic murmurs ABD:  Flat, positive bowel sounds normal in frequency in pitch, no bruits, no rebound, no guarding, no midline pulsatile mass, no hepatomegaly, no splenomegaly EXT:  2 plus pulses throughout, no edema, no cyanosis no clubbing    EKG:  Sinus rhythm, rate 50, axis within normal limits, intervals within normal limits, no acute ST-T wave changes.  08/22/2017    ASSESSMENT AND PLAN  Aortic stenosis -  The AS was moderate on echo last year.  However, she is had some progressive symptoms with shortness of breath.  Therefore, I am going to repeat an echocardiogram.  Of note her EF by cath did not seem to be as severe in 2015 his echo suggested.  Further plans will be based on these results.  Atrial fibrillation-   [name] has ith a risk of stroke of 3.21%a CHA2DS2 - VASc score of 3.  She has not had any symptomatic recurrence.  She tolerates anticoagulation.   Carotid stenosis - This was mild years ago.  No further testing is indicated.

## 2017-08-22 ENCOUNTER — Ambulatory Visit (INDEPENDENT_AMBULATORY_CARE_PROVIDER_SITE_OTHER): Payer: Medicare Other | Admitting: Cardiology

## 2017-08-22 ENCOUNTER — Encounter: Payer: Self-pay | Admitting: Cardiology

## 2017-08-22 VITALS — BP 120/68 | HR 50 | Ht 64.0 in | Wt 167.0 lb

## 2017-08-22 DIAGNOSIS — I480 Paroxysmal atrial fibrillation: Secondary | ICD-10-CM | POA: Diagnosis not present

## 2017-08-22 DIAGNOSIS — I350 Nonrheumatic aortic (valve) stenosis: Secondary | ICD-10-CM | POA: Diagnosis not present

## 2017-08-22 NOTE — Patient Instructions (Signed)
Medication Instructions:  The current medical regimen is effective;  continue present plan and medications.  Testing/Procedures: Your physician has requested that you have an echocardiogram. Echocardiography is a painless test that uses sound waves to create images of your heart. It provides your doctor with information about the size and shape of your heart and how well your heart's chambers and valves are working. This procedure takes approximately one hour. There are no restrictions for this procedure.  Follow-Up: Follow up in 1 year with Dr. Hochrein.  You will receive a letter in the mail 2 months before you are due.  Please call us when you receive this letter to schedule your follow up appointment.  If you need a refill on your cardiac medications before your next appointment, please call your pharmacy.  Thank you for choosing [facility]!!     

## 2017-08-23 ENCOUNTER — Ambulatory Visit (INDEPENDENT_AMBULATORY_CARE_PROVIDER_SITE_OTHER): Payer: Medicare Other

## 2017-08-23 DIAGNOSIS — Z7901 Long term (current) use of anticoagulants: Secondary | ICD-10-CM

## 2017-08-23 DIAGNOSIS — Z86718 Personal history of other venous thrombosis and embolism: Secondary | ICD-10-CM

## 2017-08-23 LAB — POCT INR: INR: 2.5 (ref 2.0–3.0)

## 2017-08-23 NOTE — Progress Notes (Signed)
Pre visit review using our clinic tool,if applicable. No additional management support is needed unless otherwise documented below in the visit note.   Pt here for INR check per order from Dr. Penni Homans.  Goal INR = 2.0-3.0  Last INR =2.1  Pt currently takes Coumadin 2.5 mg on Tuesday and Saturday then 5 mg all other days.  Pt denies recent antibiotics, no dietary changes and no unusual bruising / bleeding.  No complaints voiced this visit.  INR today 2.5   Pt advised per Dr. Charlett Blake,  continue taking Coumadin as ordered   Return for INR check in 1 month. Patient has follow up with Dr. Charlett Blake around that time states she will have INR checked during office visit.

## 2017-08-28 ENCOUNTER — Ambulatory Visit (HOSPITAL_COMMUNITY): Payer: Medicare Other | Attending: Cardiology

## 2017-08-28 ENCOUNTER — Other Ambulatory Visit: Payer: Self-pay

## 2017-08-28 DIAGNOSIS — I429 Cardiomyopathy, unspecified: Secondary | ICD-10-CM | POA: Insufficient documentation

## 2017-08-28 DIAGNOSIS — I082 Rheumatic disorders of both aortic and tricuspid valves: Secondary | ICD-10-CM | POA: Diagnosis not present

## 2017-08-28 DIAGNOSIS — I480 Paroxysmal atrial fibrillation: Secondary | ICD-10-CM | POA: Diagnosis not present

## 2017-08-28 DIAGNOSIS — E785 Hyperlipidemia, unspecified: Secondary | ICD-10-CM | POA: Insufficient documentation

## 2017-08-28 DIAGNOSIS — I350 Nonrheumatic aortic (valve) stenosis: Secondary | ICD-10-CM

## 2017-08-28 DIAGNOSIS — Z8249 Family history of ischemic heart disease and other diseases of the circulatory system: Secondary | ICD-10-CM | POA: Insufficient documentation

## 2017-08-30 ENCOUNTER — Telehealth: Payer: Self-pay | Admitting: Cardiology

## 2017-08-30 DIAGNOSIS — I350 Nonrheumatic aortic (valve) stenosis: Secondary | ICD-10-CM

## 2017-08-30 NOTE — Telephone Encounter (Signed)
New message   Pt is calling to see to see if she can make an appt w/Dr. Angelena Form or Dr. Burt Knack for a consult based on the conversation with Dr. Percival Spanish.

## 2017-09-03 ENCOUNTER — Telehealth: Payer: Self-pay | Admitting: Cardiology

## 2017-09-03 NOTE — Telephone Encounter (Signed)
New Message:      Pt is calling per request to set up and appt with [facility]/ [facility] for TAVR per Hochrein.

## 2017-09-03 NOTE — Telephone Encounter (Signed)
I have spoken with the patient and TAVR consult arranged.I made the pt aware that I will contact her with an earlier appointment if one becomes available.

## 2017-09-04 ENCOUNTER — Encounter: Payer: Self-pay | Admitting: Physician Assistant

## 2017-09-06 ENCOUNTER — Other Ambulatory Visit: Payer: Self-pay | Admitting: Family Medicine

## 2017-09-11 ENCOUNTER — Ambulatory Visit (INDEPENDENT_AMBULATORY_CARE_PROVIDER_SITE_OTHER): Payer: Medicare Other | Admitting: Cardiovascular Disease

## 2017-09-11 ENCOUNTER — Encounter: Payer: Self-pay | Admitting: Cardiovascular Disease

## 2017-09-11 VITALS — BP 132/72 | HR 56 | Ht 64.0 in | Wt 168.0 lb

## 2017-09-11 DIAGNOSIS — I350 Nonrheumatic aortic (valve) stenosis: Secondary | ICD-10-CM | POA: Diagnosis not present

## 2017-09-11 NOTE — Progress Notes (Signed)
Valve Clinic Consult Note  Chief Complaint  Patient presents with  . New Patient (Initial Visit)    severe aortic stenosis   History of Present Illness: 77 yo female with history of CAD, atrial fibrillation, HTN, carotid artery disease, chronic venous insufficiency, prior DVT, GERD, and aortic stenosis who is referred today by Dr. Percival Spanish for further evaluation of her aortic stenosis and discussion regarding TAVR. She has paroxysmal atrial fibrillation and is on chronic warfarin therapy. She has mild non-obstructive CAD by cardiac cath in February 2015. She has been followed for moderate aortic stenosis for the last few years. Most recent echo 08/28/17 showed normal LV systolic function with ONGE=95-28%. The aortic valve leaflets are thickened and calcified with limited mobility. Mean gradient 46 mmHg, peak gradient 89 mmHg, AVA 0.61 cm2, DVI 0.22.   She describes dyspnea with exertion and dizziness but no near syncope or syncope. She has no chest pain or lower extremity edema. She has full dentures. She lives with her daughter. She is a retired Chiropractor.   Primary Care Physician: Mosie Lukes, MD Primary Cardiologist: Minus Breeding Referring Cardiologist: Minus Breeding  Past Medical History:  Diagnosis Date  . Abdominal pain 08/25/2014  . Adiposity 07/28/2013  . Anemia   . Aortic stenosis    echo 11/11: EF 60%, mild LVH, mod/severe AS with mean 32 mmHg, peak 55 mmHg, mild AI, mild MR, (previous echo 2010 with mean gradient 29 mmHg)  . Aortic valve disorder 12/01/2008   Qualifier: Diagnosis of  By: Percival Spanish, MD, Farrel Gordon     . Arthritis, degenerative 05/21/2002  . Atrial fibrillation ([place])   . Atrial tachycardia ([place])   . Benign essential HTN 04/17/2006  . Bilateral dry eyes   . Bone/cartilage disorder 04/26/2001  . Brain aneurysm 01/16/2015  . Buzzing in ear 04/12/2006  . CAD (coronary artery disease)   . Carotid stenosis    dopplers 11/11: 0-39% bilat  . CAROTID STENOSIS  12/01/2008   Qualifier: Diagnosis of  By: Percival Spanish, MD, Farrel Gordon    . Chest pain, unspecified 06/16/2010  . Chronic venous insufficiency 05/21/2002  . DVT 11/27/2008   Qualifier: Diagnosis of  By: Percival Spanish, MD, Farrel Gordon    . GERD (gastroesophageal reflux disease)   . H/O cataract extraction 03/15/2007  . History of artificial joint 03/17/2005  . History of chicken pox   . Hyperlipidemia   . Hyperlipidemia, mild 10/08/2014       . Leg pain 10/17/2004  . Macular degeneration    Dr.DAbanzo  . Neck pain on left side 04/01/2015  . Osteoarthrosis involving more than one site but not generalized 05/30/2010   Overview:  cervical spine 05/30/10 Right shoulder  05/30/10   . Osteopenia 10/08/2014  . Other congenital anomaly of toes   . Pain in joint, pelvic region and thigh   . Peripheral edema 08/25/2014  . Postconcussive syndrome 04/01/2015  . Shortness of breath 06/16/2010    Past Surgical History:  Procedure Laterality Date  . cataract surgery    . CHOLECYSTECTOMY    . FOOT SURGERY    . hyroglossal duct cyst resection    . LEFT AND RIGHT HEART CATHETERIZATION WITH CORONARY ANGIOGRAM N/A 03/21/2013   Procedure: LEFT AND RIGHT HEART CATHETERIZATION WITH CORONARY ANGIOGRAM;  Surgeon: Minus Breeding, MD;  Location: [facility] CATH LAB;  Service: Cardiovascular;  Laterality: N/A;  . left hip replacement    . RADICAL HYSTERECTOMY    . TONSILLECTOMY      Current  Outpatient Medications  Medication Sig Dispense Refill  . acetaminophen (TYLENOL) 500 MG tablet Take 1 tablet by mouth every 8 (eight) hours as needed for mild pain or headache.     . calcium citrate (CALCITRATE - DOSED IN MG ELEMENTAL CALCIUM) 950 MG tablet Take 200 mg of elemental calcium by mouth daily.    Marland Kitchen diltiazem (CARTIA XT) 240 MG 24 hr capsule Take 1 capsule (240 mg total) by mouth daily. 90 capsule 0  . famciclovir (FAMVIR) 500 MG tablet TAKE ONE TABLET BY MOUTH TWO TIMES A DAY FOR 7 DAYS 14 tablet 1  . furosemide (LASIX) 20 MG tablet  TAKE ONE TABLET BY MOUTH DAILY AS NEEDED WEIGHT GAIN > 3LB IN 24 HOURS  OR INCREASED EDEMA 45 tablet 0  . gabapentin (NEURONTIN) 800 MG tablet Take 1 tablet (800 mg total) by mouth 2 (two) times daily. 90 tablet 1  . hyoscyamine (LEVSIN SL) 0.125 MG SL tablet Place 1 tablet (0.125 mg total) under the tongue every 4 (four) hours as needed. 30 tablet 0  . metoprolol tartrate (LOPRESSOR) 25 MG tablet TAKE 1 TABLET BY MOUTH 2 TIMES DAILY 180 tablet 0  . Multiple Vitamin (MULTIVITAMIN) tablet Take 1 tablet by mouth daily.      . multivitamin-lutein (OCUVITE-LUTEIN) CAPS capsule Take 1 capsule by mouth daily.    . Omega 3 1000 MG CAPS Take 1,000 mg by mouth daily. Reported on 03/24/2015    . omeprazole (PRILOSEC) 40 MG capsule TAKE ONE CAPSULE BY MOUTH ONCE DAILY 90 capsule 5  . Polyethyl Glycol-Propyl Glycol (SYSTANE OP) Apply 1 drop to eye 2 (two) times daily as needed. For dry eyes    . Probiotic Product (PHILLIPS COLON HEALTH PO) Take 1 capsule by mouth daily.    . traMADol (ULTRAM) 50 MG tablet Take 1 tablet (50 mg total) by mouth every 8 (eight) hours as needed. 40 tablet 0  . warfarin (COUMADIN) 5 MG tablet TAKE DAILY AS DIRECTED BY COUMADIN CLINIC 90 tablet 2   No current facility-administered medications for this visit.     Allergies  Allergen Reactions  . Indomethacin Nausea And Vomiting    Caused chest pain.  . Codeine Nausea And Vomiting    Social History   Socioeconomic History  . Marital status: Divorced    Spouse name: Not on file  . Number of children: 2  . Years of education: Not on file  . Highest education level: Not on file  Occupational History  . Occupation: Retired-Worked in Writer  Social Needs  . Financial resource strain: Not on file  . Food insecurity:    Worry: Not on file    Inability: Not on file  . Transportation needs:    Medical: Not on file    Non-medical: Not on file  Tobacco Use  . Smoking status: Never Smoker  . Smokeless tobacco: Never  Used  Substance and Sexual Activity  . Alcohol use: No  . Drug use: No  . 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 Physical activity:    Days per week: Not on file    Minutes per session: Not on file  . Stress: Not on file  Relationships  . Social connections:    Talks on phone: Not on file    Gets together: Not on file    Attends religious service: Not on file    Active member of  club or organization: Not on file    Attends meetings of clubs or organizations: Not on file    Relationship status: Not on file  . Intimate partner violence:    Fear of current or ex partner: Not on file    Emotionally abused: Not on file    Physically abused: Not on file    Forced sexual activity: Not on file  Other Topics Concern  . Not on file  Social History Narrative  . Not on file    Family History  Problem Relation Age of Onset  . Heart disease Mother        CHF  . Hypertension Mother 58  . Heart disease Father 50       MI  . Cancer Maternal Aunt        Breast  . Heart disease Maternal Grandmother   . Heart disease Maternal Grandfather   . Hernia Daughter   . Gallstones Daughter     Review of Systems:  As stated in the HPI and otherwise negative.   BP 132/72 (BP Location: Left Arm, Patient Position: Sitting, Cuff Size: Normal)   Pulse (!) 56   Ht 5\' 4"  (1.626 m)   Wt 168 lb (76.2 kg)   SpO2 99%   BMI 28.84 kg/m   Physical Examination: General: Well developed, well nourished, NAD  HEENT: OP clear, mucus membranes moist  SKIN: warm, dry. No rashes. Neuro: No focal deficits  Musculoskeletal: Muscle strength 5/5 all ext  Psychiatric: Mood and affect normal  Neck: No JVD, no carotid bruits, no thyromegaly, no lymphadenopathy.  Lungs:Clear bilaterally, no wheezes, rhonci, crackles Cardiovascular: Regular rate and rhythm. Loud, harsh, late peaking systolic murmur.  Abdomen:Soft. Bowel sounds present.  Non-tender.  Extremities: No lower extremity edema. Pulses are 2 + in the bilateral DP/PT.  Echo 08/28/17: Left ventricle: The cavity size was normal. There was mild focal   basal hypertrophy of the septum. Systolic function was normal.   The estimated ejection fraction was in the range of 60% to 65%.   Wall motion was normal; there were no regional wall motion   abnormalities. The study is indeterminate for the evaluation of   LV diastolic function. - Aortic valve: Valve mobility was moderate to severely restricted.   In parasternal short axis view, only noncoronary cusp appears to   significantly open. There was severe stenosis. There was trivial   regurgitation. Peak velocity (S): 472 cm/s. Mean gradient (S): 46   mm Hg. Peak gradient (S): 89 mm Hg. Valve area (VTI): 1.11 cm^2.   Valve area (Vmax): 0.92 cm^2. Valve area (Vmean): 1.03 cm^2. - Mitral valve: There was no evidence for stenosis. There was   trivial regurgitation. - Left atrium: The atrium was mildly dilated. - Right ventricle: The cavity size was mildly dilated. Wall   thickness was normal. - Tricuspid valve: There was mild regurgitation. - Pulmonic valve: There was no significant regurgitation. - Pulmonary arteries: PA peak pressure: 41 mm Hg (S).  Impressions:  - Severe aortic stenosis based on peak velocity >4 m/s and   gradients. Valve is calcified with restricted mobility.   Progression since last echo. Normal LV EF, no other significant   valvular disease.  ------------------------------------------------------------------- Left ventricle:  The cavity size was normal. There was mild focal basal hypertrophy of the septum. Systolic function was normal. The estimated ejection fraction was in the range of 60% to 65%. Wall motion was normal; there were no regional wall motion abnormalities. The  study is indeterminate for the evaluation of LV diastolic  function.  ------------------------------------------------------------------- Aortic valve:   Trileaflet; moderately thickened, moderately calcified leaflets. Valve mobility was moderate to severely restricted. In parasternal short axis view, only noncoronary cusp appears to significantly open.  Doppler:   There was severe stenosis.   There was trivial regurgitation.    VTI ratio of LVOT to aortic valve: 0.22. Valve area (VTI): 1.11 cm^2. Indexed valve area (VTI): 0.61 cm^2/m^2. Peak velocity ratio of LVOT to aortic valve: 0.22. Valve area (Vmax): 0.92 cm^2. Indexed valve area (Vmax): 0.49 cm^2/m^2. Mean velocity ratio of LVOT to aortic valve: 0.25. Valve area (Vmean): 1.03 cm^2. Indexed valve area (Vmean): 0.55 cm^2/m^2.    Mean gradient (S): 46 mm Hg. Peak gradient (S): 89 mm Hg.  ------------------------------------------------------------------- Aorta:  Aortic root: The aortic root was normal in size.  ------------------------------------------------------------------- Mitral valve:   Mildly thickened leaflets . Mobility was not restricted.  Doppler:   There was no evidence for stenosis.   There was trivial regurgitation.    Valve area by pressure half-time: 1.93 cm^2. Indexed valve area by pressure half-time: 1.03 cm^2/m^2.    Peak gradient (D): 3 mm Hg.  ------------------------------------------------------------------- Left atrium:  The atrium was mildly dilated.  ------------------------------------------------------------------- Right ventricle:  The cavity size was mildly dilated. Wall thickness was normal. Systolic function was normal.  ------------------------------------------------------------------- Pulmonic valve:    The valve appears to be grossly normal. Doppler:  Transvalvular velocity was within the normal range. There was no evidence for stenosis. There was no  significant regurgitation.  ------------------------------------------------------------------- Tricuspid valve:   Structurally normal valve.    Doppler: Transvalvular velocity was within the normal range. There was mild regurgitation.  ------------------------------------------------------------------- Pulmonary artery:   The main pulmonary artery was normal-sized.  ------------------------------------------------------------------- Right atrium:  The atrium was normal in size.  ------------------------------------------------------------------- Pericardium:  There was no pericardial effusion.  ------------------------------------------------------------------- Systemic veins: Inferior vena cava: The vessel was normal in size.  ------------------------------------------------------------------- Measurements   Left ventricle                           Value          Reference  LV ID, ED, PLAX chordal          (L)     36    mm       43 - 52  LV ID, ES, PLAX chordal          (L)     21    mm       23 - 38  LV fx shortening, PLAX chordal           42    %        >=29  LV PW thickness, ED                      11    mm       ----------  IVS/LV PW ratio, ED                      0.91           <=1.3  Stroke volume, 2D                        131   ml       ----------  Stroke volume/bsa, 2D  70    ml/m^2   ----------  LV e&', lateral                           7.94  cm/s     ----------  LV E/e&', lateral                         10.33          ----------  LV e&', medial                            5.98  cm/s     ----------  LV E/e&', medial                          13.71          ----------  LV e&', average                           6.96  cm/s     ----------  LV E/e&', average                         11.78          ----------    Ventricular septum                       Value          Reference  IVS thickness, ED                        10    mm       ----------     LVOT                                     Value          Reference  LVOT ID, S                               23    mm       ----------  LVOT area                                4.15  cm^2     ----------  LVOT peak velocity, S                    105   cm/s     ----------  LVOT mean velocity, S                    77.1  cm/s     ----------  LVOT VTI, S                              31.6  cm       ----------    Aortic valve                             Value  Reference  Aortic valve peak velocity, S            472   cm/s     ----------  Aortic valve mean velocity, S            312   cm/s     ----------  Aortic valve VTI, S                      118   cm       ----------  Aortic mean gradient, S                  46    mm Hg    ----------  Aortic peak gradient, S                  89    mm Hg    ----------  VTI ratio, LVOT/AV                       0.22           ----------  Aortic valve area, VTI                   1.11  cm^2     ----------  Aortic valve area/bsa, VTI               0.61  cm^2/m^2 ----------  Velocity ratio, peak, LVOT/AV            0.22           ----------  Aortic valve area, peak velocity         0.92  cm^2     ----------  Aortic valve area/bsa, peak              0.49  cm^2/m^2 ----------  velocity  Velocity ratio, mean, LVOT/AV            0.25           ----------  Aortic valve area, mean velocity         1.03  cm^2     ----------  Aortic valve area/bsa, mean              0.55  cm^2/m^2 ----------  velocity    Aorta                                    Value          Reference  Aortic root ID, ED                       32    mm       ----------  Ascending aorta ID, A-P, S               38    mm       ----------    Left atrium                              Value          Reference  LA ID, A-P, ES                           38    mm       ----------  LA ID/bsa, A-P                           2.03  cm/m^2   <=2.2  LA volume, S                             52.2  ml        ----------  LA volume/bsa, S                         27.9  ml/m^2   ----------  LA volume, ES, 1-p A4C                   56.6  ml       ----------  LA volume/bsa, ES, 1-p A4C               30.2  ml/m^2   ----------  LA volume, ES, 1-p A2C                   48.1  ml       ----------  LA volume/bsa, ES, 1-p A2C               25.7  ml/m^2   ----------    Mitral valve                             Value          Reference  Mitral E-wave peak velocity              82    cm/s     ----------  Mitral A-wave peak velocity              95.6  cm/s     ----------  Mitral deceleration time         (H)     391   ms       150 - 230  Mitral pressure half-time                114   ms       ----------  Mitral peak gradient, D                  3     mm Hg    ----------  Mitral E/A ratio, peak                   0.9            ----------  Mitral valve area, PHT, DP               1.93  cm^2     ----------  Mitral valve area/bsa, PHT, DP           1.03  cm^2/m^2 ----------    Pulmonary arteries                       Value          Reference  PA pressure, S, DP               (H)     41    mm Hg    <=30    Tricuspid valve  Value          Reference  Tricuspid regurg peak velocity           309   cm/s     ----------  Tricuspid peak RV-RA gradient            38    mm Hg    ----------    Right atrium                             Value          Reference  RA ID, S-I, ES, A4C                      45    mm       34 - 49  RA area, ES, A4C                         13.8  cm^2     8.3 - 19.5  RA volume, ES, A/L                       34.3  ml       ----------  RA volume/bsa, ES, A/L                   18.3  ml/m^2   ----------    Systemic veins                           Value          Reference  Estimated CVP                            3     mm Hg    ----------    Right ventricle                          Value          Reference  TAPSE                                    23.3  mm       ----------  RV  pressure, S, DP               (H)     41    mm Hg    <=30  RV s&', lateral, S                        14    cm/s     ----------   EKG:  EKG from 08/22/17 is reviewed by me today and shows sinus brady with non-specific ST abnormalities.   Recent Labs: 11/27/2016: Hemoglobin 13.4; Platelets 246.0 12/14/2016: ALT 15; BUN 15; Creatinine, Ser 0.62; Potassium 4.8; Sodium 140; TSH 1.68    Wt Readings from Last 3 Encounters:  09/11/17 168 lb (76.2 kg)  08/22/17 167 lb (75.8 kg)  06/14/17 171 lb 12.8 oz (77.9 kg)     Other studies Reviewed: Additional studies/ records that were reviewed today include: echo images,  Review of the above records demonstrates:    Assessment and Plan:   1.  Severe aortic valve stenosis: She has severe, stage D aortic valve stenosis. I have personally reviewed the echo images. The aortic valve is thickened, calcified with limited leaflet mobility. I think she would benefit from AVR. Given advanced age, she is not a good candidate for conventional AVR by surgical approach. I think she may be a good candidate for TAVR.   I have reviewed the natural history of aortic stenosis with the patient and their family members  who are present today. We have discussed the limitations of medical therapy and the poor prognosis associated with symptomatic aortic stenosis. We have reviewed potential treatment options, including palliative medical therapy, conventional surgical aortic valve replacement, and transcatheter aortic valve replacement. We discussed treatment options in the context of the patient's specific comorbid medical conditions.   STS Risk Score:  Procedure: Isolated AVR   Risk of Mortality: 1.926%  Renal Failure: 0.956%  Permanent Stroke: 1.007%  Prolonged Ventilation: 4.926%  DSW Infection: 0.082%  Reoperation: 2.868%  Morbidity or Mortality: 8.423%  Short Length of Stay: 33.589%  Long Length of Stay: 4.674%    She would like to think about her options before  proceeding with planning for TAVR. She will call back to arrange the right and left heart catheterization. Risks and benefits of the cath procedure and the TAVR procedure reviewed with the patient. After the cath, she will have a cardiac CT, CTA of the chest/abdomen and pelvis, PFTs, carotid dopplers and a PT assessment and will then be referred to see one of the CT surgeons on our TAVR team.    Current medicines are reviewed at length with the patient today.  The patient does not have concerns regarding medicines.  The following changes have been made:  no change  Labs/ tests ordered today include:  No orders of the defined types were placed in this encounter.    Disposition:   FU with the valve team.    Signed, Lauree Chandler, MD 09/11/2017 10:39 AM    [address], [place], [place]  [address] Phone: [phone]; Fax: [phone]

## 2017-09-11 NOTE — Patient Instructions (Signed)
Medication Instructions:  Your physician recommends that you continue on your current medications as directed. Please refer to the Current Medication list given to you today.   Labwork: none  Testing/Procedures: none  Follow-Up: Call Theodosia Quay, RN if you would like to proceed with TAVR  Any Other Special Instructions Will Be Listed Below (If Applicable).     If you need a refill on your cardiac medications before your next appointment, please call your pharmacy.  Marland Kitchen

## 2017-09-11 NOTE — H&P (View-Only) (Signed)
Valve Clinic Consult Note  Chief Complaint  Patient presents with  . New Patient (Initial Visit)    severe aortic stenosis   History of Present Illness: 77 yo female with history of CAD, atrial fibrillation, HTN, carotid artery disease, chronic venous insufficiency, prior DVT, GERD, and aortic stenosis who is referred today by Dr. Percival Spanish for further evaluation of her aortic stenosis and discussion regarding TAVR. She has paroxysmal atrial fibrillation and is on chronic warfarin therapy. She has mild non-obstructive CAD by cardiac cath in February 2015. She has been followed for moderate aortic stenosis for the last few years. Most recent echo 08/28/17 showed normal LV systolic function with GNFA=21-30%. The aortic valve leaflets are thickened and calcified with limited mobility. Mean gradient 46 mmHg, peak gradient 89 mmHg, AVA 0.61 cm2, DVI 0.22.   She describes dyspnea with exertion and dizziness but no near syncope or syncope. She has no chest pain or lower extremity edema. She has full dentures. She lives with her daughter. She is a retired Chiropractor.   Primary Care Physician: Mosie Lukes, MD Primary Cardiologist: Minus Breeding Referring Cardiologist: Minus Breeding  Past Medical History:  Diagnosis Date  . Abdominal pain 08/25/2014  . Adiposity 07/28/2013  . Anemia   . Aortic stenosis    echo 11/11: EF 60%, mild LVH, mod/severe AS with mean 32 mmHg, peak 55 mmHg, mild AI, mild MR, (previous echo 2010 with mean gradient 29 mmHg)  . Aortic valve disorder 12/01/2008   Qualifier: Diagnosis of  By: Percival Spanish, MD, Farrel Gordon     . Arthritis, degenerative 05/21/2002  . Atrial fibrillation ([place])   . Atrial tachycardia ([place])   . Benign essential HTN 04/17/2006  . Bilateral dry eyes   . Bone/cartilage disorder 04/26/2001  . Brain aneurysm 01/16/2015  . Buzzing in ear 04/12/2006  . CAD (coronary artery disease)   . Carotid stenosis    dopplers 11/11: 0-39% bilat  . CAROTID STENOSIS  12/01/2008   Qualifier: Diagnosis of  By: Percival Spanish, MD, Farrel Gordon    . Chest pain, unspecified 06/16/2010  . Chronic venous insufficiency 05/21/2002  . DVT 11/27/2008   Qualifier: Diagnosis of  By: Percival Spanish, MD, Farrel Gordon    . GERD (gastroesophageal reflux disease)   . H/O cataract extraction 03/15/2007  . History of artificial joint 03/17/2005  . History of chicken pox   . Hyperlipidemia   . Hyperlipidemia, mild 10/08/2014       . Leg pain 10/17/2004  . Macular degeneration    Dr.DAbanzo  . Neck pain on left side 04/01/2015  . Osteoarthrosis involving more than one site but not generalized 05/30/2010   Overview:  cervical spine 05/30/10 Right shoulder  05/30/10   . Osteopenia 10/08/2014  . Other congenital anomaly of toes   . Pain in joint, pelvic region and thigh   . Peripheral edema 08/25/2014  . Postconcussive syndrome 04/01/2015  . Shortness of breath 06/16/2010    Past Surgical History:  Procedure Laterality Date  . cataract surgery    . CHOLECYSTECTOMY    . FOOT SURGERY    . hyroglossal duct cyst resection    . LEFT AND RIGHT HEART CATHETERIZATION WITH CORONARY ANGIOGRAM N/A 03/21/2013   Procedure: LEFT AND RIGHT HEART CATHETERIZATION WITH CORONARY ANGIOGRAM;  Surgeon: Minus Breeding, MD;  Location: [facility] CATH LAB;  Service: Cardiovascular;  Laterality: N/A;  . left hip replacement    . RADICAL HYSTERECTOMY    . TONSILLECTOMY      Current  Outpatient Medications  Medication Sig Dispense Refill  . acetaminophen (TYLENOL) 500 MG tablet Take 1 tablet by mouth every 8 (eight) hours as needed for mild pain or headache.     . calcium citrate (CALCITRATE - DOSED IN MG ELEMENTAL CALCIUM) 950 MG tablet Take 200 mg of elemental calcium by mouth daily.    Marland Kitchen diltiazem (CARTIA XT) 240 MG 24 hr capsule Take 1 capsule (240 mg total) by mouth daily. 90 capsule 0  . famciclovir (FAMVIR) 500 MG tablet TAKE ONE TABLET BY MOUTH TWO TIMES A DAY FOR 7 DAYS 14 tablet 1  . furosemide (LASIX) 20 MG tablet  TAKE ONE TABLET BY MOUTH DAILY AS NEEDED WEIGHT GAIN > 3LB IN 24 HOURS  OR INCREASED EDEMA 45 tablet 0  . gabapentin (NEURONTIN) 800 MG tablet Take 1 tablet (800 mg total) by mouth 2 (two) times daily. 90 tablet 1  . hyoscyamine (LEVSIN SL) 0.125 MG SL tablet Place 1 tablet (0.125 mg total) under the tongue every 4 (four) hours as needed. 30 tablet 0  . metoprolol tartrate (LOPRESSOR) 25 MG tablet TAKE 1 TABLET BY MOUTH 2 TIMES DAILY 180 tablet 0  . Multiple Vitamin (MULTIVITAMIN) tablet Take 1 tablet by mouth daily.      . multivitamin-lutein (OCUVITE-LUTEIN) CAPS capsule Take 1 capsule by mouth daily.    . Omega 3 1000 MG CAPS Take 1,000 mg by mouth daily. Reported on 03/24/2015    . omeprazole (PRILOSEC) 40 MG capsule TAKE ONE CAPSULE BY MOUTH ONCE DAILY 90 capsule 5  . Polyethyl Glycol-Propyl Glycol (SYSTANE OP) Apply 1 drop to eye 2 (two) times daily as needed. For dry eyes    . Probiotic Product (PHILLIPS COLON HEALTH PO) Take 1 capsule by mouth daily.    . traMADol (ULTRAM) 50 MG tablet Take 1 tablet (50 mg total) by mouth every 8 (eight) hours as needed. 40 tablet 0  . warfarin (COUMADIN) 5 MG tablet TAKE DAILY AS DIRECTED BY COUMADIN CLINIC 90 tablet 2   No current facility-administered medications for this visit.     Allergies  Allergen Reactions  . Indomethacin Nausea And Vomiting    Caused chest pain.  . Codeine Nausea And Vomiting    Social History   Socioeconomic History  . Marital status: Divorced    Spouse name: Not on file  . Number of children: 2  . Years of education: Not on file  . Highest education level: Not on file  Occupational History  . Occupation: Retired-Worked in Writer  Social Needs  . Financial resource strain: Not on file  . Food insecurity:    Worry: Not on file    Inability: Not on file  . Transportation needs:    Medical: Not on file    Non-medical: Not on file  Tobacco Use  . Smoking status: Never Smoker  . Smokeless tobacco: Never  Used  Substance and Sexual Activity  . Alcohol use: No  . Drug use: No  . 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 Physical activity:    Days per week: Not on file    Minutes per session: Not on file  . Stress: Not on file  Relationships  . Social connections:    Talks on phone: Not on file    Gets together: Not on file    Attends religious service: Not on file    Active member of  club or organization: Not on file    Attends meetings of clubs or organizations: Not on file    Relationship status: Not on file  . Intimate partner violence:    Fear of current or ex partner: Not on file    Emotionally abused: Not on file    Physically abused: Not on file    Forced sexual activity: Not on file  Other Topics Concern  . Not on file  Social History Narrative  . Not on file    Family History  Problem Relation Age of Onset  . Heart disease Mother        CHF  . Hypertension Mother 33  . Heart disease Father 10       MI  . Cancer Maternal Aunt        Breast  . Heart disease Maternal Grandmother   . Heart disease Maternal Grandfather   . Hernia Daughter   . Gallstones Daughter     Review of Systems:  As stated in the HPI and otherwise negative.   BP 132/72 (BP Location: Left Arm, Patient Position: Sitting, Cuff Size: Normal)   Pulse (!) 56   Ht 5\' 4"  (1.626 m)   Wt 168 lb (76.2 kg)   SpO2 99%   BMI 28.84 kg/m   Physical Examination: General: Well developed, well nourished, NAD  HEENT: OP clear, mucus membranes moist  SKIN: warm, dry. No rashes. Neuro: No focal deficits  Musculoskeletal: Muscle strength 5/5 all ext  Psychiatric: Mood and affect normal  Neck: No JVD, no carotid bruits, no thyromegaly, no lymphadenopathy.  Lungs:Clear bilaterally, no wheezes, rhonci, crackles Cardiovascular: Regular rate and rhythm. Loud, harsh, late peaking systolic murmur.  Abdomen:Soft. Bowel sounds present.  Non-tender.  Extremities: No lower extremity edema. Pulses are 2 + in the bilateral DP/PT.  Echo 08/28/17: Left ventricle: The cavity size was normal. There was mild focal   basal hypertrophy of the septum. Systolic function was normal.   The estimated ejection fraction was in the range of 60% to 65%.   Wall motion was normal; there were no regional wall motion   abnormalities. The study is indeterminate for the evaluation of   LV diastolic function. - Aortic valve: Valve mobility was moderate to severely restricted.   In parasternal short axis view, only noncoronary cusp appears to   significantly open. There was severe stenosis. There was trivial   regurgitation. Peak velocity (S): 472 cm/s. Mean gradient (S): 46   mm Hg. Peak gradient (S): 89 mm Hg. Valve area (VTI): 1.11 cm^2.   Valve area (Vmax): 0.92 cm^2. Valve area (Vmean): 1.03 cm^2. - Mitral valve: There was no evidence for stenosis. There was   trivial regurgitation. - Left atrium: The atrium was mildly dilated. - Right ventricle: The cavity size was mildly dilated. Wall   thickness was normal. - Tricuspid valve: There was mild regurgitation. - Pulmonic valve: There was no significant regurgitation. - Pulmonary arteries: PA peak pressure: 41 mm Hg (S).  Impressions:  - Severe aortic stenosis based on peak velocity >4 m/s and   gradients. Valve is calcified with restricted mobility.   Progression since last echo. Normal LV EF, no other significant   valvular disease.  ------------------------------------------------------------------- Left ventricle:  The cavity size was normal. There was mild focal basal hypertrophy of the septum. Systolic function was normal. The estimated ejection fraction was in the range of 60% to 65%. Wall motion was normal; there were no regional wall motion abnormalities. The  study is indeterminate for the evaluation of LV diastolic  function.  ------------------------------------------------------------------- Aortic valve:   Trileaflet; moderately thickened, moderately calcified leaflets. Valve mobility was moderate to severely restricted. In parasternal short axis view, only noncoronary cusp appears to significantly open.  Doppler:   There was severe stenosis.   There was trivial regurgitation.    VTI ratio of LVOT to aortic valve: 0.22. Valve area (VTI): 1.11 cm^2. Indexed valve area (VTI): 0.61 cm^2/m^2. Peak velocity ratio of LVOT to aortic valve: 0.22. Valve area (Vmax): 0.92 cm^2. Indexed valve area (Vmax): 0.49 cm^2/m^2. Mean velocity ratio of LVOT to aortic valve: 0.25. Valve area (Vmean): 1.03 cm^2. Indexed valve area (Vmean): 0.55 cm^2/m^2.    Mean gradient (S): 46 mm Hg. Peak gradient (S): 89 mm Hg.  ------------------------------------------------------------------- Aorta:  Aortic root: The aortic root was normal in size.  ------------------------------------------------------------------- Mitral valve:   Mildly thickened leaflets . Mobility was not restricted.  Doppler:   There was no evidence for stenosis.   There was trivial regurgitation.    Valve area by pressure half-time: 1.93 cm^2. Indexed valve area by pressure half-time: 1.03 cm^2/m^2.    Peak gradient (D): 3 mm Hg.  ------------------------------------------------------------------- Left atrium:  The atrium was mildly dilated.  ------------------------------------------------------------------- Right ventricle:  The cavity size was mildly dilated. Wall thickness was normal. Systolic function was normal.  ------------------------------------------------------------------- Pulmonic valve:    The valve appears to be grossly normal. Doppler:  Transvalvular velocity was within the normal range. There was no evidence for stenosis. There was no  significant regurgitation.  ------------------------------------------------------------------- Tricuspid valve:   Structurally normal valve.    Doppler: Transvalvular velocity was within the normal range. There was mild regurgitation.  ------------------------------------------------------------------- Pulmonary artery:   The main pulmonary artery was normal-sized.  ------------------------------------------------------------------- Right atrium:  The atrium was normal in size.  ------------------------------------------------------------------- Pericardium:  There was no pericardial effusion.  ------------------------------------------------------------------- Systemic veins: Inferior vena cava: The vessel was normal in size.  ------------------------------------------------------------------- Measurements   Left ventricle                           Value          Reference  LV ID, ED, PLAX chordal          (L)     36    mm       43 - 52  LV ID, ES, PLAX chordal          (L)     21    mm       23 - 38  LV fx shortening, PLAX chordal           42    %        >=29  LV PW thickness, ED                      11    mm       ----------  IVS/LV PW ratio, ED                      0.91           <=1.3  Stroke volume, 2D                        131   ml       ----------  Stroke volume/bsa, 2D  70    ml/m^2   ----------  LV e&', lateral                           7.94  cm/s     ----------  LV E/e&', lateral                         10.33          ----------  LV e&', medial                            5.98  cm/s     ----------  LV E/e&', medial                          13.71          ----------  LV e&', average                           6.96  cm/s     ----------  LV E/e&', average                         11.78          ----------    Ventricular septum                       Value          Reference  IVS thickness, ED                        10    mm       ----------     LVOT                                     Value          Reference  LVOT ID, S                               23    mm       ----------  LVOT area                                4.15  cm^2     ----------  LVOT peak velocity, S                    105   cm/s     ----------  LVOT mean velocity, S                    77.1  cm/s     ----------  LVOT VTI, S                              31.6  cm       ----------    Aortic valve                             Value  Reference  Aortic valve peak velocity, S            472   cm/s     ----------  Aortic valve mean velocity, S            312   cm/s     ----------  Aortic valve VTI, S                      118   cm       ----------  Aortic mean gradient, S                  46    mm Hg    ----------  Aortic peak gradient, S                  89    mm Hg    ----------  VTI ratio, LVOT/AV                       0.22           ----------  Aortic valve area, VTI                   1.11  cm^2     ----------  Aortic valve area/bsa, VTI               0.61  cm^2/m^2 ----------  Velocity ratio, peak, LVOT/AV            0.22           ----------  Aortic valve area, peak velocity         0.92  cm^2     ----------  Aortic valve area/bsa, peak              0.49  cm^2/m^2 ----------  velocity  Velocity ratio, mean, LVOT/AV            0.25           ----------  Aortic valve area, mean velocity         1.03  cm^2     ----------  Aortic valve area/bsa, mean              0.55  cm^2/m^2 ----------  velocity    Aorta                                    Value          Reference  Aortic root ID, ED                       32    mm       ----------  Ascending aorta ID, A-P, S               38    mm       ----------    Left atrium                              Value          Reference  LA ID, A-P, ES                           38    mm       ----------  LA ID/bsa, A-P                           2.03  cm/m^2   <=2.2  LA volume, S                             52.2  ml        ----------  LA volume/bsa, S                         27.9  ml/m^2   ----------  LA volume, ES, 1-p A4C                   56.6  ml       ----------  LA volume/bsa, ES, 1-p A4C               30.2  ml/m^2   ----------  LA volume, ES, 1-p A2C                   48.1  ml       ----------  LA volume/bsa, ES, 1-p A2C               25.7  ml/m^2   ----------    Mitral valve                             Value          Reference  Mitral E-wave peak velocity              82    cm/s     ----------  Mitral A-wave peak velocity              95.6  cm/s     ----------  Mitral deceleration time         (H)     391   ms       150 - 230  Mitral pressure half-time                114   ms       ----------  Mitral peak gradient, D                  3     mm Hg    ----------  Mitral E/A ratio, peak                   0.9            ----------  Mitral valve area, PHT, DP               1.93  cm^2     ----------  Mitral valve area/bsa, PHT, DP           1.03  cm^2/m^2 ----------    Pulmonary arteries                       Value          Reference  PA pressure, S, DP               (H)     41    mm Hg    <=30    Tricuspid valve  Value          Reference  Tricuspid regurg peak velocity           309   cm/s     ----------  Tricuspid peak RV-RA gradient            38    mm Hg    ----------    Right atrium                             Value          Reference  RA ID, S-I, ES, A4C                      45    mm       34 - 49  RA area, ES, A4C                         13.8  cm^2     8.3 - 19.5  RA volume, ES, A/L                       34.3  ml       ----------  RA volume/bsa, ES, A/L                   18.3  ml/m^2   ----------    Systemic veins                           Value          Reference  Estimated CVP                            3     mm Hg    ----------    Right ventricle                          Value          Reference  TAPSE                                    23.3  mm       ----------  RV  pressure, S, DP               (H)     41    mm Hg    <=30  RV s&', lateral, S                        14    cm/s     ----------   EKG:  EKG from 08/22/17 is reviewed by me today and shows sinus brady with non-specific ST abnormalities.   Recent Labs: 11/27/2016: Hemoglobin 13.4; Platelets 246.0 12/14/2016: ALT 15; BUN 15; Creatinine, Ser 0.62; Potassium 4.8; Sodium 140; TSH 1.68    Wt Readings from Last 3 Encounters:  09/11/17 168 lb (76.2 kg)  08/22/17 167 lb (75.8 kg)  06/14/17 171 lb 12.8 oz (77.9 kg)     Other studies Reviewed: Additional studies/ records that were reviewed today include: echo images,  Review of the above records demonstrates:    Assessment and Plan:   1.  Severe aortic valve stenosis: She has severe, stage D aortic valve stenosis. I have personally reviewed the echo images. The aortic valve is thickened, calcified with limited leaflet mobility. I think she would benefit from AVR. Given advanced age, she is not a good candidate for conventional AVR by surgical approach. I think she may be a good candidate for TAVR.   I have reviewed the natural history of aortic stenosis with the patient and their family members  who are present today. We have discussed the limitations of medical therapy and the poor prognosis associated with symptomatic aortic stenosis. We have reviewed potential treatment options, including palliative medical therapy, conventional surgical aortic valve replacement, and transcatheter aortic valve replacement. We discussed treatment options in the context of the patient's specific comorbid medical conditions.   STS Risk Score:  Procedure: Isolated AVR   Risk of Mortality: 1.926%  Renal Failure: 0.956%  Permanent Stroke: 1.007%  Prolonged Ventilation: 4.926%  DSW Infection: 0.082%  Reoperation: 2.868%  Morbidity or Mortality: 8.423%  Short Length of Stay: 33.589%  Long Length of Stay: 4.674%    She would like to think about her options before  proceeding with planning for TAVR. She will call back to arrange the right and left heart catheterization. Risks and benefits of the cath procedure and the TAVR procedure reviewed with the patient. After the cath, she will have a cardiac CT, CTA of the chest/abdomen and pelvis, PFTs, carotid dopplers and a PT assessment and will then be referred to see one of the CT surgeons on our TAVR team.    Current medicines are reviewed at length with the patient today.  The patient does not have concerns regarding medicines.  The following changes have been made:  no change  Labs/ tests ordered today include:  No orders of the defined types were placed in this encounter.    Disposition:   FU with the valve team.    Signed, Lauree Chandler, MD 09/11/2017 10:39 AM    [address] [place], [place],   [address] Phone: [phone]; Fax: [phone]

## 2017-09-17 ENCOUNTER — Other Ambulatory Visit: Payer: Self-pay

## 2017-09-17 DIAGNOSIS — Z01810 Encounter for preprocedural cardiovascular examination: Secondary | ICD-10-CM

## 2017-09-17 DIAGNOSIS — I350 Nonrheumatic aortic (valve) stenosis: Secondary | ICD-10-CM

## 2017-09-18 ENCOUNTER — Other Ambulatory Visit: Payer: Medicare Other

## 2017-09-18 ENCOUNTER — Other Ambulatory Visit (INDEPENDENT_AMBULATORY_CARE_PROVIDER_SITE_OTHER): Payer: Medicare Other

## 2017-09-18 ENCOUNTER — Ambulatory Visit: Payer: Medicare Other | Admitting: Family Medicine

## 2017-09-18 ENCOUNTER — Ambulatory Visit (INDEPENDENT_AMBULATORY_CARE_PROVIDER_SITE_OTHER): Payer: Medicare Other

## 2017-09-18 DIAGNOSIS — Z01810 Encounter for preprocedural cardiovascular examination: Secondary | ICD-10-CM | POA: Diagnosis not present

## 2017-09-18 DIAGNOSIS — Z86718 Personal history of other venous thrombosis and embolism: Secondary | ICD-10-CM

## 2017-09-18 DIAGNOSIS — I350 Nonrheumatic aortic (valve) stenosis: Secondary | ICD-10-CM

## 2017-09-18 DIAGNOSIS — Z7901 Long term (current) use of anticoagulants: Secondary | ICD-10-CM

## 2017-09-18 LAB — POCT INR: INR: 2.6 (ref 2.0–3.0)

## 2017-09-18 NOTE — Telephone Encounter (Signed)
Referral send to [facility] and message send to Theodosia Quay to call and schedule pt appt.

## 2017-09-18 NOTE — Progress Notes (Signed)
Pre visit review using our clinic tool,if applicable. No additional management support is needed unless otherwise documented below in the visit note.  Pt here for INR check per Dr. Charlett Blake  Goal INR = 2.0-3.0 Last INR =2.5 Pt currently takes Coumadin 2.5 Tuesday and Saturday. 5 mg all other days.  Pt denies recent antibiotics, no dietary changes and no unusual bruising / bleeding.  Patient states she  Will be having Heart valve replacement. Starting Luevonox soon. Will come off Coumadin on Sunday. Will call office when she knows dates for sure.  INR today = 2.6 Pt advised per

## 2017-09-19 LAB — CBC
HEMOGLOBIN: 12.5 g/dL (ref 11.1–15.9)
Hematocrit: 39 % (ref 34.0–46.6)
MCH: 29.4 pg (ref 26.6–33.0)
MCHC: 32.1 g/dL (ref 31.5–35.7)
MCV: 92 fL (ref 79–97)
Platelets: 251 10*3/uL (ref 150–450)
RBC: 4.25 x10E6/uL (ref 3.77–5.28)
RDW: 13.9 % (ref 12.3–15.4)
WBC: 7.1 10*3/uL (ref 3.4–10.8)

## 2017-09-19 LAB — BASIC METABOLIC PANEL
BUN/Creatinine Ratio: 24 (ref 12–28)
BUN: 15 mg/dL (ref 8–27)
CALCIUM: 8.9 mg/dL (ref 8.7–10.3)
CHLORIDE: 105 mmol/L (ref 96–106)
CO2: 25 mmol/L (ref 20–29)
Creatinine, Ser: 0.63 mg/dL (ref 0.57–1.00)
GFR, EST AFRICAN AMERICAN: 99 mL/min/{1.73_m2} (ref >59)
GFR, EST NON AFRICAN AMERICAN: 86 mL/min/{1.73_m2} (ref >59)
Glucose: 93 mg/dL (ref 65–99)
POTASSIUM: 4.3 mmol/L (ref 3.5–5.2)
Sodium: 144 mmol/L (ref 134–144)

## 2017-09-19 NOTE — Progress Notes (Signed)
Minus Breeding, MD  Barkley Boards, RN        OK without Lovenox.   Previous Messages    ----- Message -----  From: Barkley Boards, RN  Sent: 09/19/2017 11:24 AM EDT  To: Minus Breeding, MD  Subject: ? lovenox bridge                 Hey,   Dr Angelena Form is out of the office this week and I need advisement on whether or not this pt requires a lovenox bridge prior to cardiac cath on 8/23. She is on coumadin with PAF and Hx DVT. She said she has had procedures in the past and one time she did lovenox and the other procedure she did not. She will be stopping coumadin this weekend.   Thanks,  Lauren        2:42 PM 09/19/17  I left a voicemail for the pt that she does not need lovenox bridge prior to cardiac cath.  Pre Cath labs are normal.

## 2017-09-21 NOTE — Addendum Note (Signed)
Addended by: Lauree Chandler D on: 09/21/2017 07:25 AM   Modules accepted: Orders, SmartSet

## 2017-09-24 ENCOUNTER — Encounter: Payer: Self-pay | Admitting: Physician Assistant

## 2017-09-24 ENCOUNTER — Other Ambulatory Visit: Payer: Self-pay | Admitting: Physician Assistant

## 2017-09-24 DIAGNOSIS — I350 Nonrheumatic aortic (valve) stenosis: Secondary | ICD-10-CM

## 2017-09-27 ENCOUNTER — Telehealth: Payer: Self-pay | Admitting: *Deleted

## 2017-09-27 ENCOUNTER — Institutional Professional Consult (permissible substitution): Payer: Medicare Other | Admitting: Cardiovascular Disease

## 2017-09-27 NOTE — Telephone Encounter (Addendum)
Catheterization scheduled at [facility] for: Friday August 23,2019 7:30 AM Verify arrival time and place: [place] Entrance A at: 5:30 AM  No solid food after midnight prior to cath, clear liquids until 5 AM day of procedure. Verify allergies in Epic Verify no diabetes medications.  Hold: Coumadin 09/23/17 until post procedure.- pt states Dr Charlett Blake is aware coumadin on hold for procedure. Furosemide-AM of procedure.  Except hold medications AM meds can be  taken pre-cath with sip of water including: ASA 81 mg  Confirm patient has responsible person to drive home post procedure and for 24 hours after you arrive home.   LMTCB to discuss instructions with patient.  I discussed instructions for procedure, pt verbalized understanding, thanked me for call.

## 2017-09-28 ENCOUNTER — Encounter (HOSPITAL_COMMUNITY)
Admission: RE | Disposition: A | Discharge status: Home / Self Care | Payer: Self-pay | Source: Ambulatory Visit | Attending: Cardiovascular Disease

## 2017-09-28 ENCOUNTER — Other Ambulatory Visit: Payer: Self-pay

## 2017-09-28 ENCOUNTER — Ambulatory Visit (HOSPITAL_COMMUNITY)
Admission: RE | Admit: 2017-09-28 | Discharge: 2017-09-28 | Disposition: A | Discharge status: Home / Self Care | Payer: Medicare Other | Source: Ambulatory Visit | Attending: Cardiovascular Disease | Admitting: Cardiovascular Disease

## 2017-09-28 ENCOUNTER — Encounter (HOSPITAL_COMMUNITY): Payer: Self-pay | Admitting: Cardiovascular Disease

## 2017-09-28 DIAGNOSIS — I872 Venous insufficiency (chronic) (peripheral): Secondary | ICD-10-CM | POA: Insufficient documentation

## 2017-09-28 DIAGNOSIS — I480 Paroxysmal atrial fibrillation: Secondary | ICD-10-CM | POA: Diagnosis not present

## 2017-09-28 DIAGNOSIS — Z7901 Long term (current) use of anticoagulants: Secondary | ICD-10-CM | POA: Diagnosis not present

## 2017-09-28 DIAGNOSIS — I350 Nonrheumatic aortic (valve) stenosis: Secondary | ICD-10-CM | POA: Diagnosis not present

## 2017-09-28 DIAGNOSIS — Z885 Allergy status to narcotic agent status: Secondary | ICD-10-CM | POA: Diagnosis not present

## 2017-09-28 DIAGNOSIS — Z86718 Personal history of other venous thrombosis and embolism: Secondary | ICD-10-CM | POA: Insufficient documentation

## 2017-09-28 DIAGNOSIS — I2584 Coronary atherosclerosis due to calcified coronary lesion: Secondary | ICD-10-CM | POA: Diagnosis not present

## 2017-09-28 DIAGNOSIS — I6523 Occlusion and stenosis of bilateral carotid arteries: Secondary | ICD-10-CM | POA: Diagnosis not present

## 2017-09-28 DIAGNOSIS — M1990 Unspecified osteoarthritis, unspecified site: Secondary | ICD-10-CM | POA: Insufficient documentation

## 2017-09-28 DIAGNOSIS — M8580 Other specified disorders of bone density and structure, unspecified site: Secondary | ICD-10-CM | POA: Insufficient documentation

## 2017-09-28 DIAGNOSIS — K219 Gastro-esophageal reflux disease without esophagitis: Secondary | ICD-10-CM | POA: Insufficient documentation

## 2017-09-28 DIAGNOSIS — I10 Essential (primary) hypertension: Secondary | ICD-10-CM | POA: Insufficient documentation

## 2017-09-28 DIAGNOSIS — E785 Hyperlipidemia, unspecified: Secondary | ICD-10-CM | POA: Diagnosis not present

## 2017-09-28 DIAGNOSIS — I2510 Atherosclerotic heart disease of native coronary artery without angina pectoris: Secondary | ICD-10-CM | POA: Diagnosis not present

## 2017-09-28 HISTORY — PX: RIGHT/LEFT HEART CATH AND CORONARY ANGIOGRAPHY: CATH118266

## 2017-09-28 LAB — POCT I-STAT 3, VENOUS BLOOD GAS (G3P V)
ACID-BASE EXCESS: 3 mmol/L — AB (ref 0.0–2.0)
BICARBONATE: 25.4 mmol/L (ref 20.0–28.0)
Bicarbonate: 28.5 mmol/L — ABNORMAL HIGH (ref 20.0–28.0)
O2 Saturation: 71 %
O2 Saturation: 72 %
PCO2 VEN: 45.7 mmHg (ref 44.0–60.0)
PH VEN: 7.353 (ref 7.250–7.430)
TCO2: 27 mmol/L (ref 22–32)
TCO2: 30 mmol/L (ref 22–32)
pCO2, Ven: 48.4 mmHg (ref 44.0–60.0)
pH, Ven: 7.378 (ref 7.250–7.430)
pO2, Ven: 39 mmHg (ref 32.0–45.0)
pO2, Ven: 40 mmHg (ref 32.0–45.0)

## 2017-09-28 LAB — POCT I-STAT 3, ART BLOOD GAS (G3+)
Acid-base deficit: 1 mmol/L (ref 0.0–2.0)
Bicarbonate: 24.5 mmol/L (ref 20.0–28.0)
O2 SAT: 94 %
PCO2 ART: 43 mmHg (ref 32.0–48.0)
PH ART: 7.364 (ref 7.350–7.450)
TCO2: 26 mmol/L (ref 22–32)
pO2, Arterial: 72 mmHg — ABNORMAL LOW (ref 83.0–108.0)

## 2017-09-28 LAB — PROTIME-INR
INR: 1.02
PROTHROMBIN TIME: 13.4 s (ref 11.4–15.2)

## 2017-09-28 SURGERY — RIGHT/LEFT HEART CATH AND CORONARY ANGIOGRAPHY
Anesthesia: LOCAL

## 2017-09-28 MED ORDER — IOHEXOL 350 MG/ML SOLN
INTRAVENOUS | Status: DC | PRN
  Administered 2017-09-28: 55 mL via INTRA_ARTERIAL

## 2017-09-28 MED ORDER — SODIUM CHLORIDE 0.9 % IV SOLN: 250.0000 mL | INTRAVENOUS | Status: DC | PRN

## 2017-09-28 MED ORDER — HEPARIN SODIUM (PORCINE) 1000 UNIT/ML IJ SOLN
INTRAMUSCULAR | Status: DC | PRN
  Administered 2017-09-28: 4000 [IU] via INTRAVENOUS

## 2017-09-28 MED ORDER — SODIUM CHLORIDE 0.9% FLUSH: 3.0000 mL | INTRAVENOUS | Status: DC | PRN

## 2017-09-28 MED ORDER — SODIUM CHLORIDE 0.9% FLUSH: 3.0000 mL | Freq: Two times a day (BID) | INTRAVENOUS | Status: DC

## 2017-09-28 MED ORDER — SODIUM CHLORIDE 0.9 % IV SOLN: INTRAVENOUS | Status: AC

## 2017-09-28 MED ORDER — FENTANYL CITRATE (PF) 100 MCG/2ML IJ SOLN
INTRAMUSCULAR | Status: DC | PRN
  Administered 2017-09-28: 25 ug via INTRAVENOUS

## 2017-09-28 MED ORDER — HEPARIN (PORCINE) IN NACL 1000-0.9 UT/500ML-% IV SOLN
INTRAVENOUS | Status: AC
  Filled 2017-09-28: qty 1000

## 2017-09-28 MED ORDER — VERAPAMIL HCL 2.5 MG/ML IV SOLN
INTRAVENOUS | Status: AC
  Filled 2017-09-28: qty 2

## 2017-09-28 MED ORDER — IOPAMIDOL (ISOVUE-370) INJECTION 76%
INTRAVENOUS | Status: AC
  Filled 2017-09-28: qty 100

## 2017-09-28 MED ORDER — HEPARIN SODIUM (PORCINE) 1000 UNIT/ML IJ SOLN
INTRAMUSCULAR | Status: AC
  Filled 2017-09-28: qty 1

## 2017-09-28 MED ORDER — FENTANYL CITRATE (PF) 100 MCG/2ML IJ SOLN
INTRAMUSCULAR | Status: AC
  Filled 2017-09-28: qty 2

## 2017-09-28 MED ORDER — ASPIRIN 81 MG PO CHEW: 81.0000 mg | CHEWABLE_TABLET | ORAL | Status: DC

## 2017-09-28 MED ORDER — LIDOCAINE HCL (PF) 1 % IJ SOLN
INTRAMUSCULAR | Status: DC | PRN
  Administered 2017-09-28 (×2): 2 mL via INTRADERMAL

## 2017-09-28 MED ORDER — MIDAZOLAM HCL 2 MG/2ML IJ SOLN
INTRAMUSCULAR | Status: AC
  Filled 2017-09-28: qty 2

## 2017-09-28 MED ORDER — VERAPAMIL HCL 2.5 MG/ML IV SOLN
INTRAVENOUS | Status: DC | PRN
  Administered 2017-09-28: 10 mL via INTRA_ARTERIAL

## 2017-09-28 MED ORDER — LIDOCAINE HCL (PF) 1 % IJ SOLN
INTRAMUSCULAR | Status: AC
  Filled 2017-09-28: qty 30

## 2017-09-28 MED ORDER — HEPARIN (PORCINE) IN NACL 1000-0.9 UT/500ML-% IV SOLN
INTRAVENOUS | Status: DC | PRN
  Administered 2017-09-28 (×2): 500 mL

## 2017-09-28 MED ORDER — ACETAMINOPHEN 325 MG PO TABS
650.0000 mg | ORAL_TABLET | ORAL | Status: DC | PRN
  Administered 2017-09-28: 650 mg via ORAL
  Filled 2017-09-28: qty 2

## 2017-09-28 MED ORDER — SODIUM CHLORIDE 0.9 % IV SOLN
INTRAVENOUS | Status: DC
  Administered 2017-09-28: 07:00:00 via INTRAVENOUS

## 2017-09-28 MED ORDER — MIDAZOLAM HCL 2 MG/2ML IJ SOLN
INTRAMUSCULAR | Status: DC | PRN
  Administered 2017-09-28: 1 mg via INTRAVENOUS

## 2017-09-28 MED ORDER — ONDANSETRON HCL 4 MG/2ML IJ SOLN: 4.0000 mg | Freq: Four times a day (QID) | INTRAMUSCULAR | Status: DC | PRN

## 2017-09-28 SURGICAL SUPPLY — 15 items
CATH BALLN WEDGE 5F 110CM (CATHETERS) ×1 IMPLANT
CATH INFINITI 5 FR JL3.5 (CATHETERS) ×1 IMPLANT
CATH INFINITI 5FR AL1 (CATHETERS) ×1 IMPLANT
CATH INFINITI JR4 5F (CATHETERS) ×1 IMPLANT
DEVICE RAD COMP TR BAND LRG (VASCULAR PRODUCTS) ×1 IMPLANT
GLIDESHEATH SLEND SS 6F .021 (SHEATH) ×1 IMPLANT
GUIDEWIRE .025 260CM (WIRE) ×1 IMPLANT
GUIDEWIRE INQWIRE 1.5J.035X260 (WIRE) IMPLANT
INQWIRE 1.5J .035X260CM (WIRE) ×4
KIT HEART LEFT (KITS) ×2 IMPLANT
PACK CARDIAC CATHETERIZATION (CUSTOM PROCEDURE TRAY) ×2 IMPLANT
SHEATH GLIDE SLENDER 4/5FR (SHEATH) ×1 IMPLANT
TRANSDUCER W/STOPCOCK (MISCELLANEOUS) ×2 IMPLANT
TUBING CIL FLEX 10 FLL-RA (TUBING) ×2 IMPLANT
WIRE EMERALD ST .035X150CM (WIRE) ×1 IMPLANT

## 2017-09-28 NOTE — Interval H&P Note (Signed)
History and Physical Interval Note:  09/28/2017 7:12 AM  Mikayla [name]  has presented today for cardiac cath with the diagnosis of severe aortic stenosis  The various methods of treatment have been discussed with the patient and family. After consideration of risks, benefits and other options for treatment, the patient has consented to  Procedure(s): RIGHT/LEFT HEART CATH AND CORONARY ANGIOGRAPHY (N/A) as a surgical intervention .  The patient's history has been reviewed, patient examined, no change in status, stable for surgery.  I have reviewed the patient's chart and labs.  Questions were answered to the patient's satisfaction.    Cath Lab Visit (complete for each Cath Lab visit)  Clinical Evaluation Leading to the Procedure:   ACS: No.  Non-ACS:    Anginal Classification: CCS II  Anti-ischemic medical therapy: Maximal Therapy (2 or more classes of medications)  Non-Invasive Test Results: No non-invasive testing performed  Prior CABG: No previous CABG         Lauree Chandler

## 2017-09-28 NOTE — Discharge Instructions (Signed)
**Note -Identified via Obfuscation** Radial Site Care Refer to this sheet in the next few weeks. These instructions provide you with information about caring for yourself after your procedure. Your health care provider may also give you more specific instructions. Your treatment has been planned according to current medical practices, but problems sometimes occur. Call your health care provider if you have any problems or questions after your procedure. What can I expect after the procedure? After your procedure, it is typical to have the following:  Bruising at the radial site that usually fades within 1-2 weeks.  Blood collecting in the tissue (hematoma) that may be painful to the touch. It should usually decrease in size and tenderness within 1-2 weeks.  Follow these instructions at home:  Take medicines only as directed by your health care provider.  You may shower 24-48 hours after the procedure or as directed by your health care provider. Remove the bandage (dressing) and gently wash the site with plain soap and water. Pat the area dry with a clean towel. Do not rub the site, because this may cause bleeding.  Do not take baths, swim, or use a hot tub until your health care provider approves.  Check your insertion site every day for redness, swelling, or drainage.  Do not apply powder or lotion to the site.  Do not flex or bend the affected arm for 24 hours or as directed by your health care provider.  Do not push or pull heavy objects with the affected arm for 24 hours or as directed by your health care provider.  Do not lift over 10 lb (4.5 kg) for 5 days after your procedure or as directed by your health care provider.  Ask your health care provider when it is okay to: ? Return to work or school. ? Resume usual physical activities or sports. ? Resume sexual activity.  Do not drive home if you are discharged the same day as the procedure. Have someone else drive you.  You may drive 24 hours after the procedure  unless otherwise instructed by your health care provider.  Do not operate machinery or power tools for 24 hours after the procedure.  If your procedure was done as an outpatient procedure, which means that you went home the same day as your procedure, a responsible adult should be with you for the first 24 hours after you arrive home.  Keep all follow-up visits as directed by your health care provider. This is important. Contact a health care provider if:  You have a fever.  You have chills.  You have increased bleeding from the radial site. Hold pressure on the site. Get help right away if:  You have unusual pain at the radial site.  You have redness, warmth, or swelling at the radial site.  You have drainage (other than a small amount of blood on the dressing) from the radial site.  The radial site is bleeding, and the bleeding does not stop after 30 minutes of holding steady pressure on the site.  Your arm or hand becomes pale, cool, tingly, or numb. This information is not intended to replace advice given to you by your health care provider. Make sure you discuss any questions you have with your health care provider. Document Released: 02/25/2010 Document Revised: 07/01/2015 Document Reviewed: 08/11/2013 Elsevier Interactive Patient Education  2018 [place] coumadin tonight.

## 2017-10-03 ENCOUNTER — Ambulatory Visit (INDEPENDENT_AMBULATORY_CARE_PROVIDER_SITE_OTHER): Payer: Medicare Other

## 2017-10-03 DIAGNOSIS — Z7901 Long term (current) use of anticoagulants: Secondary | ICD-10-CM

## 2017-10-03 DIAGNOSIS — Z86718 Personal history of other venous thrombosis and embolism: Secondary | ICD-10-CM

## 2017-10-03 LAB — POCT INR: INR: 1.2 — AB (ref 2.0–3.0)

## 2017-10-03 NOTE — Progress Notes (Addendum)
Pre visit review using our clinic tool,if applicable. No additional management support is needed unless otherwise documented below in the visit note.  Pt here for INR check per  Goal INR =  Last INR =  Pt currently takes Coumadin   Pt denies recent antibiotics, no dietary changes and no unusual bruising / bleeding.  Patient had procedure on 09/28/17. Stopped Coumadin for 5 days.Restarted Coumadin on Saturday 09/29/17.  Taking 2.5 mg on Saturday and Tuesday and 5 mg all other days.   INR today = 1.2  Pt advised per Dr. Larose Kells, DOD continue taking Coumadin 2.5 mg on Saturday and Tuesday and 5 mg all other days. Return on Tuesday 10/10/27  for INR check.  Kathlene November, MD

## 2017-10-05 ENCOUNTER — Other Ambulatory Visit: Payer: Self-pay | Admitting: Family Medicine

## 2017-10-09 ENCOUNTER — Ambulatory Visit (INDEPENDENT_AMBULATORY_CARE_PROVIDER_SITE_OTHER): Payer: Medicare Other

## 2017-10-09 ENCOUNTER — Other Ambulatory Visit: Payer: Self-pay | Admitting: Family Medicine

## 2017-10-09 DIAGNOSIS — Z7901 Long term (current) use of anticoagulants: Secondary | ICD-10-CM | POA: Diagnosis not present

## 2017-10-09 NOTE — Progress Notes (Signed)
Pre visit review using our clinic tool,if applicable. No additional management support is needed unless otherwise documented below in the visit note.   Pt here for INR check per order from Dr. Frederik Pear  Goal INR = 2.0-3.0  Last INR = 1.2  Pt currently takes Coumadin 2.5 mg on Saturday and Tuesday and 5 mg all other days.  Pt denies recent antibiotics, no dietary changes and no unusual bruising / bleeding. States she had a syncopal  Episode on Saturday while shopping. Did not go to ED. Ambulance came did EKG which was normal per patient. Did not go to hospital. States she has not felt her normal self since then.   INR today = 1.9  Pt advised per Dr. Charlett Blake to keep taking the dose as ordered and return in 1 week. Patient states she sees Dr. Charlett Blake in 1 week and will have INR drawn at that time.

## 2017-10-09 NOTE — Progress Notes (Signed)
Nursing note reviewed. Agree with documention and plan.  

## 2017-10-18 ENCOUNTER — Ambulatory Visit (INDEPENDENT_AMBULATORY_CARE_PROVIDER_SITE_OTHER): Payer: Medicare Other | Admitting: Family Medicine

## 2017-10-18 ENCOUNTER — Other Ambulatory Visit: Payer: Self-pay | Admitting: Family Medicine

## 2017-10-18 VITALS — BP 122/68 | HR 57 | Temp 97.9°F | Resp 18 | Ht 64.0 in | Wt 166.6 lb

## 2017-10-18 DIAGNOSIS — I359 Nonrheumatic aortic valve disorder, unspecified: Secondary | ICD-10-CM

## 2017-10-18 DIAGNOSIS — I350 Nonrheumatic aortic (valve) stenosis: Secondary | ICD-10-CM | POA: Diagnosis not present

## 2017-10-18 DIAGNOSIS — K219 Gastro-esophageal reflux disease without esophagitis: Secondary | ICD-10-CM

## 2017-10-18 DIAGNOSIS — E785 Hyperlipidemia, unspecified: Secondary | ICD-10-CM | POA: Diagnosis not present

## 2017-10-18 DIAGNOSIS — M8580 Other specified disorders of bone density and structure, unspecified site: Secondary | ICD-10-CM

## 2017-10-18 DIAGNOSIS — Z23 Encounter for immunization: Secondary | ICD-10-CM | POA: Diagnosis not present

## 2017-10-18 DIAGNOSIS — Z86718 Personal history of other venous thrombosis and embolism: Secondary | ICD-10-CM | POA: Diagnosis not present

## 2017-10-18 DIAGNOSIS — I10 Essential (primary) hypertension: Secondary | ICD-10-CM | POA: Diagnosis not present

## 2017-10-18 LAB — LIPID PANEL
CHOL/HDL RATIO: 3
CHOLESTEROL: 196 mg/dL (ref 0–200)
HDL: 56.7 mg/dL (ref >39.00)
LDL Cholesterol: 125 mg/dL — ABNORMAL HIGH (ref 0–99)
NonHDL: 139.36
TRIGLYCERIDES: 71 mg/dL (ref 0.0–149.0)
VLDL: 14.2 mg/dL (ref 0.0–40.0)

## 2017-10-18 LAB — COMPREHENSIVE METABOLIC PANEL
ALBUMIN: 4.1 g/dL (ref 3.5–5.2)
ALT: 11 U/L (ref 0–35)
AST: 13 U/L (ref 0–37)
Alkaline Phosphatase: 70 U/L (ref 39–117)
BILIRUBIN TOTAL: 0.5 mg/dL (ref 0.2–1.2)
BUN: 20 mg/dL (ref 6–23)
CALCIUM: 9.4 mg/dL (ref 8.4–10.5)
CO2: 31 meq/L (ref 19–32)
Chloride: 102 mEq/L (ref 96–112)
Creatinine, Ser: 0.7 mg/dL (ref 0.40–1.20)
GFR: 85.79 mL/min (ref >60.00)
Glucose, Bld: 89 mg/dL (ref 70–99)
Potassium: 4.4 mEq/L (ref 3.5–5.1)
Sodium: 141 mEq/L (ref 135–145)
Total Protein: 6.7 g/dL (ref 6.0–8.3)

## 2017-10-18 LAB — CBC
HCT: 40.6 % (ref 36.0–46.0)
HEMOGLOBIN: 13.5 g/dL (ref 12.0–15.0)
MCHC: 33.4 g/dL (ref 30.0–36.0)
MCV: 92.6 fl (ref 78.0–100.0)
PLATELETS: 249 10*3/uL (ref 150.0–400.0)
RBC: 4.38 Mil/uL (ref 3.87–5.11)
RDW: 13.9 % (ref 11.5–15.5)
WBC: 7.8 10*3/uL (ref 4.0–10.5)

## 2017-10-18 LAB — POCT INR: INR: 2.8 (ref 2.0–3.0)

## 2017-10-18 LAB — TSH: TSH: 1.47 u[IU]/mL (ref 0.35–4.50)

## 2017-10-18 MED ORDER — TRAMADOL HCL 50 MG PO TABS: 50.0000 mg | ORAL_TABLET | Freq: Two times a day (BID) | ORAL | 0 refills | Status: DC | PRN

## 2017-10-18 MED ORDER — GABAPENTIN 800 MG PO TABS: 800.0000 mg | ORAL_TABLET | Freq: Two times a day (BID) | ORAL | 1 refills | Status: DC

## 2017-10-18 MED ORDER — FAMCICLOVIR 500 MG PO TABS: ORAL_TABLET | ORAL | 1 refills | Status: DC

## 2017-10-18 MED ORDER — FUROSEMIDE 20 MG PO TABS: ORAL_TABLET | ORAL | 0 refills | Status: DC

## 2017-10-18 MED ORDER — HYOSCYAMINE SULFATE 0.125 MG SL SUBL: 0.1250 mg | SUBLINGUAL_TABLET | SUBLINGUAL | 0 refills | Status: DC | PRN

## 2017-10-18 NOTE — Patient Instructions (Signed)

## 2017-10-21 NOTE — Assessment & Plan Note (Signed)
Avoid offending foods, start probiotics. Do not eat large meals in late evening and consider raising head of bed.  

## 2017-10-21 NOTE — Assessment & Plan Note (Signed)
Encouraged to get adequate exercise, calcium and vitamin d intake 

## 2017-10-21 NOTE — Assessment & Plan Note (Signed)
Encouraged heart healthy diet, increase exercise, avoid trans fats, consider a krill oil cap daily 

## 2017-10-21 NOTE — Progress Notes (Signed)
Subjective:    Patient ID: Mikayla [name], female    DOB: 17-Jun-1940, 77 y.o.   MRN: [id number]  No chief complaint on file.   HPI Patient is in today for follow up. No recent febrile illness or hospitalizations. She does note some SOB with exertion but if she paces herself she is doing well. No acute concerns. No polyuria or polydipsia. Denies CP/palp/HA/congestion/fevers/GI or GU c/o. Taking meds as prescribed  Past Medical History:  Diagnosis Date  . Abdominal pain 08/25/2014  . Adiposity 07/28/2013  . Anemia   . Aortic stenosis    echo 11/11: EF 60%, mild LVH, mod/severe AS with mean 32 mmHg, peak 55 mmHg, mild AI, mild MR, (previous echo 2010 with mean gradient 29 mmHg)  . Aortic valve disorder 12/01/2008   Qualifier: Diagnosis of  By: Percival Spanish, MD, Farrel Gordon     . Arthritis, degenerative 05/21/2002  . Atrial fibrillation ([place])   . Atrial tachycardia ([place])   . Benign essential HTN 04/17/2006  . Bilateral dry eyes   . Bone/cartilage disorder 04/26/2001  . Brain aneurysm 01/16/2015  . Buzzing in ear 04/12/2006  . CAD (coronary artery disease)   . Carotid stenosis    dopplers 11/11: 0-39% bilat  . CAROTID STENOSIS 12/01/2008   Qualifier: Diagnosis of  By: Percival Spanish, MD, Farrel Gordon    . Chest pain, unspecified 06/16/2010  . Chronic venous insufficiency 05/21/2002  . DVT 11/27/2008   Qualifier: Diagnosis of  By: Percival Spanish, MD, Farrel Gordon    . GERD (gastroesophageal reflux disease)   . H/O cataract extraction 03/15/2007  . History of artificial joint 03/17/2005  . History of chicken pox   . Hyperlipidemia   . Hyperlipidemia, mild 10/08/2014       . Leg pain 10/17/2004  . Macular degeneration    Dr.DAbanzo  . Neck pain on left side 04/01/2015  . Osteoarthrosis involving more than one site but not generalized 05/30/2010   Overview:  cervical spine 05/30/10 Right shoulder  05/30/10   . Osteopenia 10/08/2014  . Other congenital anomaly of toes   . Pain in joint, pelvic region and thigh   .  Peripheral edema 08/25/2014  . Postconcussive syndrome 04/01/2015  . Shortness of breath 06/16/2010    Past Surgical History:  Procedure Laterality Date  . cataract surgery    . CHOLECYSTECTOMY    . FOOT SURGERY    . hyroglossal duct cyst resection    . LEFT AND RIGHT HEART CATHETERIZATION WITH CORONARY ANGIOGRAM N/A 03/21/2013   Procedure: LEFT AND RIGHT HEART CATHETERIZATION WITH CORONARY ANGIOGRAM;  Surgeon: Minus Breeding, MD;  Location: [facility] CATH LAB;  Service: Cardiovascular;  Laterality: N/A;  . left hip replacement    . RADICAL HYSTERECTOMY    . RIGHT/LEFT HEART CATH AND CORONARY ANGIOGRAPHY N/A 09/28/2017   Procedure: RIGHT/LEFT HEART CATH AND CORONARY ANGIOGRAPHY;  Surgeon: Burnell Blanks, MD;  Location: [place] CV LAB;  Service: Cardiovascular;  Laterality: N/A;  . TONSILLECTOMY      Family History  Problem Relation Age of Onset  . Heart disease Mother        CHF  . Hypertension Mother 85  . Heart disease Father 13       MI  . Cancer Maternal Aunt        Breast  . Heart disease Maternal Grandmother   . Heart disease Maternal Grandfather   . Hernia Daughter   . Gallstones Daughter     Social History   Socioeconomic  History  . Marital status: Divorced    Spouse name: Not on file  . Number of children: 2  . Years of education: Not on file  . Highest education level: Not on file  Occupational History  . Occupation: Retired-Worked in Writer  Social Needs  . Financial resource strain: Not on file  . Food insecurity:    Worry: Not on file    Inability: Not on file  . Transportation needs:    Medical: Not on file    Non-medical: Not on file  Tobacco Use  . Smoking status: Never Smoker  . Smokeless tobacco: Never Used  Substance and Sexual Activity  . Alcohol use: No  . Drug use: No  . 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 Physical activity:    Days per week: Not  on file    Minutes per session: Not on file  . Stress: Not on file  Relationships  . Social connections:    Talks on phone: Not on file    Gets together: Not on file    Attends religious service: Not on file    Active member of club or organization: Not on file    Attends meetings of clubs or organizations: Not on file    Relationship status: Not on file  . Intimate partner violence:    Fear of current or ex partner: Not on file    Emotionally abused: Not on file    Physically abused: Not on file    Forced sexual activity: Not on file  Other Topics Concern  . Not on file  Social History Narrative  . Not on file    Outpatient Medications Prior to Visit  Medication Sig Dispense Refill  . acetaminophen (TYLENOL) 500 MG tablet Take 1 tablet by mouth every 8 (eight) hours as needed for mild pain or headache.     Marland Kitchen CALCIUM-MAGNESIUM-ZINC PO Take 2 tablets by mouth daily.    Marland Kitchen CARTIA XT 240 MG 24 hr capsule TAKE 1 CAPSULE BY MOUTH DAILY 90 capsule 0  . Cholecalciferol (VITAMIN D3) 1000 units CAPS Take 1,000 Units by mouth daily.    . metoprolol tartrate (LOPRESSOR) 25 MG tablet TAKE 1 TABLET BY MOUTH 2 TIMES DAILY 180 tablet 0  . Multiple Vitamin (MULTIVITAMIN) tablet Take 1 tablet by mouth daily.      . multivitamin-lutein (OCUVITE-LUTEIN) CAPS capsule Take 1 capsule by mouth daily.    . Omega 3 1200 MG CAPS Take 1,200 mg by mouth daily. Reported on 03/24/2015    . omeprazole (PRILOSEC) 40 MG capsule TAKE ONE CAPSULE BY MOUTH ONCE DAILY 90 capsule 5  . Polyethyl Glycol-Propyl Glycol (SYSTANE OP) Apply 1 drop to eye 2 (two) times daily as needed. For dry eyes    . Probiotic Product (PHILLIPS COLON HEALTH PO) Take 1 capsule by mouth daily.    Marland Kitchen warfarin (COUMADIN) 5 MG tablet TAKE DAILY AS DIRECTED BY COUMADIN CLINIC (Patient taking differently: Take 2.5-5 mg by mouth See admin instructions. Take 83my by mouth Monday, Wednesday, Thursday, Friday and Sunday. On Tuesday and Saturday take  2.5mg ) 90 tablet 2  . famciclovir (FAMVIR) 500 MG tablet TAKE ONE TABLET BY MOUTH TWO TIMES A DAY FOR 7 DAYS (Patient taking differently: TAKE ONE TABLET BY MOUTH TWO TIMES A DAY FOR 7 DAYS AS NEEDED THRUSH) 14 tablet 1  . furosemide (LASIX) 20 MG tablet TAKE ONE  TABLET BY MOUTH DAILY AS NEEDED WEIGHT GAIN > 3LB IN 24 HOURS  OR INCREASED EDEMA 45 tablet 0  . gabapentin (NEURONTIN) 800 MG tablet TAKE 1 TABLET BY MOUTH 2 TIMES DAILY 60 tablet 0  . hyoscyamine (LEVSIN SL) 0.125 MG SL tablet Place 1 tablet (0.125 mg total) under the tongue every 4 (four) hours as needed. 30 tablet 0  . traMADol (ULTRAM) 50 MG tablet Take 1 tablet (50 mg total) by mouth every 8 (eight) hours as needed. 40 tablet 0   No facility-administered medications prior to visit.     Allergies  Allergen Reactions  . Indomethacin Nausea And Vomiting    Caused chest pain.  . Codeine Nausea And Vomiting    Review of Systems  Constitutional: Negative for fever and malaise/fatigue.  HENT: Negative for congestion.   Eyes: Negative for blurred vision.  Respiratory: Negative for shortness of breath.   Cardiovascular: Negative for chest pain, palpitations and leg swelling.  Gastrointestinal: Negative for abdominal pain, blood in stool and nausea.  Genitourinary: Negative for dysuria and frequency.  Musculoskeletal: Negative for falls.  Skin: Negative for rash.  Neurological: Negative for dizziness, loss of consciousness and headaches.  Endo/Heme/Allergies: Negative for environmental allergies.  Psychiatric/Behavioral: Negative for depression. The patient is not nervous/anxious.        Objective:    Physical Exam  Constitutional: She is oriented to person, place, and time. She appears well-developed and well-nourished. No distress.  HENT:  Head: Normocephalic and atraumatic.  Nose: Nose normal.  Eyes: Right eye exhibits no discharge. Left eye exhibits no discharge.  Neck: Normal range of motion. Neck supple.    Cardiovascular: Normal rate and regular rhythm.  Murmur heard. Pulmonary/Chest: Effort normal and breath sounds normal.  Abdominal: Soft. Bowel sounds are normal. There is no tenderness.  Musculoskeletal: She exhibits no edema.  Neurological: She is alert and oriented to person, place, and time.  Skin: Skin is warm and dry.  Psychiatric: She has a normal mood and affect.  Nursing note and vitals reviewed.   BP 122/68 (BP Location: Left Arm, Patient Position: Sitting, Cuff Size: Normal)   Pulse (!) 57   Temp 97.9 F (36.6 C) (Oral)   Resp 18   Ht 5\' 4"  (1.626 m)   Wt 166 lb 9.6 oz (75.6 kg)   SpO2 95%   BMI 28.60 kg/m  Wt Readings from Last 3 Encounters:  10/18/17 166 lb 9.6 oz (75.6 kg)  09/28/17 165 lb (74.8 kg)  09/11/17 168 lb (76.2 kg)     Lab Results  Component Value Date   WBC 7.8 10/18/2017   HGB 13.5 10/18/2017   HCT 40.6 10/18/2017   PLT 249.0 10/18/2017   GLUCOSE 89 10/18/2017   CHOL 196 10/18/2017   TRIG 71.0 10/18/2017   HDL 56.70 10/18/2017   LDLCALC 125 (H) 10/18/2017   ALT 11 10/18/2017   AST 13 10/18/2017   NA 141 10/18/2017   K 4.4 10/18/2017   CL 102 10/18/2017   CREATININE 0.70 10/18/2017   BUN 20 10/18/2017   CO2 31 10/18/2017   TSH 1.47 10/18/2017   INR 2.8 10/18/2017    Lab Results  Component Value Date   TSH 1.47 10/18/2017   Lab Results  Component Value Date   WBC 7.8 10/18/2017   HGB 13.5 10/18/2017   HCT 40.6 10/18/2017   MCV 92.6 10/18/2017   PLT 249.0 10/18/2017   Lab Results  Component Value Date   NA 141 10/18/2017  K 4.4 10/18/2017   CO2 31 10/18/2017   GLUCOSE 89 10/18/2017   BUN 20 10/18/2017   CREATININE 0.70 10/18/2017   BILITOT 0.5 10/18/2017   ALKPHOS 70 10/18/2017   AST 13 10/18/2017   ALT 11 10/18/2017   PROT 6.7 10/18/2017   ALBUMIN 4.1 10/18/2017   CALCIUM 9.4 10/18/2017   ANIONGAP 9 01/15/2015   GFR 85.79 10/18/2017   Lab Results  Component Value Date   CHOL 196 10/18/2017   Lab Results   Component Value Date   HDL 56.70 10/18/2017   Lab Results  Component Value Date   LDLCALC 125 (H) 10/18/2017   Lab Results  Component Value Date   TRIG 71.0 10/18/2017   Lab Results  Component Value Date   CHOLHDL 3 10/18/2017   No results found for: HGBA1C     Assessment & Plan:   Problem List Items Addressed This Visit    Aortic valve disorder    Tolerating coumadin, INR 2.8       Relevant Medications   furosemide (LASIX) 20 MG tablet   Benign essential HTN   Relevant Medications   furosemide (LASIX) 20 MG tablet   Other Relevant Orders   CBC (Completed)   Comprehensive metabolic panel (Completed)   TSH (Completed)   GERD (gastroesophageal reflux disease)    Avoid offending foods, start probiotics. Do not eat large meals in late evening and consider raising head of bed.       Relevant Medications   hyoscyamine (LEVSIN SL) 0.125 MG SL tablet   History of DVT (deep vein thrombosis)   Relevant Orders   POCT INR (Completed)   Hyperlipidemia, mild    Encouraged heart healthy diet, increase exercise, avoid trans fats, consider a krill oil cap daily      Relevant Medications   furosemide (LASIX) 20 MG tablet   Other Relevant Orders   Lipid panel (Completed)   Osteopenia    Encouraged to get adequate exercise, calcium and vitamin d intake      Severe aortic stenosis   Relevant Medications   furosemide (LASIX) 20 MG tablet    Other Visit Diagnoses    Needs flu shot    -  Primary   Relevant Orders   Flu vaccine HIGH DOSE PF (Fluzone High dose) (Completed)      I have discontinued Tawana Tomes's traMADol. I have also changed her traMADol. Additionally, I am having her maintain her multivitamin, Omega 3, Polyethyl Glycol-Propyl Glycol (SYSTANE OP), acetaminophen, Probiotic Product (PHILLIPS COLON HEALTH PO), multivitamin-lutein, omeprazole, warfarin, metoprolol tartrate, Vitamin D3, CALCIUM-MAGNESIUM-ZINC PO, CARTIA XT, furosemide, hyoscyamine, gabapentin, and  famciclovir.  Meds ordered this encounter  Medications  . furosemide (LASIX) 20 MG tablet    Sig: TAKE ONE TABLET BY MOUTH DAILY AS NEEDED WEIGHT GAIN > 3LB IN 24 HOURS  OR INCREASED EDEMA    Dispense:  45 tablet    Refill:  0  . hyoscyamine (LEVSIN SL) 0.125 MG SL tablet    Sig: Place 1 tablet (0.125 mg total) under the tongue every 4 (four) hours as needed.    Dispense:  30 tablet    Refill:  0  . DISCONTD: gabapentin (NEURONTIN) 800 MG tablet    Sig: Take 1 tablet (800 mg total) by mouth 2 (two) times daily.    Dispense:  180 tablet    Refill:  1  . DISCONTD: traMADol (ULTRAM) 50 MG tablet    Sig: Take 1 tablet (50 mg total) by mouth  every 12 (twelve) hours as needed.    Dispense:  40 tablet    Refill:  0  . traMADol (ULTRAM) 50 MG tablet    Sig: Take 1 tablet (50 mg total) by mouth every 12 (twelve) hours as needed for moderate pain.    Dispense:  40 tablet    Refill:  0  . gabapentin (NEURONTIN) 800 MG tablet    Sig: Take 1 tablet (800 mg total) by mouth 2 (two) times daily.    Dispense:  180 tablet    Refill:  1  . famciclovir (FAMVIR) 500 MG tablet    Sig: TAKE ONE TABLET BY MOUTH TWO TIMES A DAY FOR 7 DAYS    Dispense:  14 tablet    Refill:  1     Penni Homans, MD

## 2017-10-21 NOTE — Assessment & Plan Note (Signed)
Tolerating coumadin, INR 2.8

## 2017-10-22 ENCOUNTER — Ambulatory Visit (HOSPITAL_COMMUNITY)
Admission: RE | Admit: 2017-10-22 | Discharge: 2017-10-22 | Disposition: A | Discharge status: Home / Self Care | Payer: Medicare Other | Source: Ambulatory Visit | Attending: Physician Assistant | Admitting: Physician Assistant

## 2017-10-22 ENCOUNTER — Ambulatory Visit (HOSPITAL_BASED_OUTPATIENT_CLINIC_OR_DEPARTMENT_OTHER)
Admission: RE | Admit: 2017-10-22 | Discharge: 2017-10-22 | Disposition: A | Discharge status: Home / Self Care | Payer: Medicare Other | Source: Ambulatory Visit | Attending: Physician Assistant | Admitting: Physician Assistant

## 2017-10-22 DIAGNOSIS — I350 Nonrheumatic aortic (valve) stenosis: Secondary | ICD-10-CM | POA: Diagnosis not present

## 2017-10-22 DIAGNOSIS — I700 Atherosclerosis of aorta: Secondary | ICD-10-CM | POA: Diagnosis not present

## 2017-10-22 DIAGNOSIS — I2510 Atherosclerotic heart disease of native coronary artery without angina pectoris: Secondary | ICD-10-CM | POA: Diagnosis not present

## 2017-10-22 DIAGNOSIS — R0602 Shortness of breath: Secondary | ICD-10-CM | POA: Diagnosis not present

## 2017-10-22 DIAGNOSIS — K5730 Diverticulosis of large intestine without perforation or abscess without bleeding: Secondary | ICD-10-CM | POA: Insufficient documentation

## 2017-10-22 DIAGNOSIS — Z9049 Acquired absence of other specified parts of digestive tract: Secondary | ICD-10-CM | POA: Insufficient documentation

## 2017-10-22 DIAGNOSIS — I6523 Occlusion and stenosis of bilateral carotid arteries: Secondary | ICD-10-CM | POA: Insufficient documentation

## 2017-10-22 LAB — PULMONARY FUNCTION TEST
DL/VA % PRED: 81 %
DL/VA: 3.99 ml/min/mmHg/L
DLCO COR: 14.94 ml/min/mmHg
DLCO UNC % PRED: 58 %
DLCO UNC: 14.99 ml/min/mmHg
DLCO cor % pred: 58 %
FEF 25-75 PRE: 2.15 L/s
FEF 25-75 Post: 3 L/sec
FEF2575-%Change-Post: 39 %
FEF2575-%PRED-POST: 197 %
FEF2575-%PRED-PRE: 141 %
FEV1-%Change-Post: 7 %
FEV1-%PRED-POST: 99 %
FEV1-%PRED-PRE: 92 %
FEV1-Post: 2.07 L
FEV1-Pre: 1.92 L
FEV1FVC-%Change-Post: 4 %
FEV1FVC-%PRED-PRE: 112 %
FEV6-%CHANGE-POST: 2 %
FEV6-%PRED-POST: 89 %
FEV6-%Pred-Pre: 87 %
FEV6-Post: 2.36 L
FEV6-Pre: 2.31 L
FEV6FVC-%PRED-PRE: 105 %
FEV6FVC-%Pred-Post: 105 %
FVC-%Change-Post: 2 %
FVC-%Pred-Post: 85 %
FVC-%Pred-Pre: 83 %
FVC-Post: 2.37 L
FVC-Pre: 2.31 L
POST FEV1/FVC RATIO: 87 %
Post FEV6/FVC ratio: 100 %
Pre FEV1/FVC ratio: 83 %
Pre FEV6/FVC Ratio: 100 %
RV % pred: 82 %
RV: 2.01 L
TLC % pred: 84 %
TLC: 4.4 L

## 2017-10-22 MED ORDER — ALBUTEROL SULFATE (2.5 MG/3ML) 0.083% IN NEBU
2.5000 mg | INHALATION_SOLUTION | Freq: Once | RESPIRATORY_TRACT | Status: AC
  Administered 2017-10-22: 2.5 mg via RESPIRATORY_TRACT

## 2017-10-22 MED ORDER — IOPAMIDOL (ISOVUE-370) INJECTION 76%
100.0000 mL | Freq: Once | INTRAVENOUS | Status: AC | PRN
  Administered 2017-10-22: 100 mL via INTRAVENOUS

## 2017-10-22 NOTE — Progress Notes (Signed)
VASCULAR LAB PRELIMINARY  PRELIMINARY  PRELIMINARY  PRELIMINARY  Carotid duplex  completed.    Preliminary report:  1-39% ICA stenosis. Vertebral artery flow is antegrade.   Elzie Knisley, RVT 10/22/2017, 12:00 PM

## 2017-10-24 ENCOUNTER — Encounter: Payer: Self-pay | Admitting: Surgery

## 2017-10-24 ENCOUNTER — Institutional Professional Consult (permissible substitution) (INDEPENDENT_AMBULATORY_CARE_PROVIDER_SITE_OTHER): Payer: Medicare Other | Admitting: Surgery

## 2017-10-24 ENCOUNTER — Encounter: Payer: Self-pay | Admitting: Physical Therapy

## 2017-10-24 ENCOUNTER — Other Ambulatory Visit: Payer: Self-pay

## 2017-10-24 ENCOUNTER — Ambulatory Visit: Payer: Medicare Other | Attending: Physician Assistant | Admitting: Physical Therapy

## 2017-10-24 VITALS — BP 130/65 | HR 54 | Resp 16 | Ht 64.0 in | Wt 165.0 lb

## 2017-10-24 DIAGNOSIS — Z86718 Personal history of other venous thrombosis and embolism: Secondary | ICD-10-CM

## 2017-10-24 DIAGNOSIS — Z7901 Long term (current) use of anticoagulants: Secondary | ICD-10-CM | POA: Diagnosis not present

## 2017-10-24 DIAGNOSIS — I350 Nonrheumatic aortic (valve) stenosis: Secondary | ICD-10-CM | POA: Diagnosis not present

## 2017-10-24 DIAGNOSIS — R2689 Other abnormalities of gait and mobility: Secondary | ICD-10-CM

## 2017-10-24 NOTE — Progress Notes (Signed)
[facility]  CARDIOTHORACIC SURGERY CONSULTATION REPORT  Referring Provider is Minus Breeding, MD PCP is Mosie Lukes, MD  Chief Complaint  Patient presents with  . Aortic Stenosis    severe...TAVR EVAL    HPI:  The patient is a 77 year old woman with a history of hypertension,hyperlipidemia, atrial fibrillation on Coumadin, chronic venous insufficiency and prior DVT, and aortic stenosis who presents with progressive exertional fatigue and shortness of breath as well as frequent episodes of dizziness. She had moderate AS by echo about one year ago with a mean gradient at that time of 34 mm Hg. Her most recent echo on 08/28/2017 showed an increase in the mean gradient to 46 mm Hg with a DI of 0.22. The LVEF was 60-65%  She is here with her daughter today whom she lives with. She has chronic weakness of her legs for over 20 years of undetermined etiology. She has to use her arms to help herself up from sitting and can't go up stairs or step up on curbs. Recently started using a 4 pronged cane.  Past Medical History:  Diagnosis Date  . Abdominal pain 08/25/2014  . Adiposity 07/28/2013  . Anemia   . Aortic stenosis    echo 11/11: EF 60%, mild LVH, mod/severe AS with mean 32 mmHg, peak 55 mmHg, mild AI, mild MR, (previous echo 2010 with mean gradient 29 mmHg)  . Aortic valve disorder 12/01/2008   Qualifier: Diagnosis of  By: Percival Spanish, MD, Farrel Gordon     . Arthritis, degenerative 05/21/2002  . Atrial fibrillation ([place])   . Atrial tachycardia ([place])   . Benign essential HTN 04/17/2006  . Bilateral dry eyes   . Bone/cartilage disorder 04/26/2001  . Brain aneurysm 01/16/2015  . Buzzing in ear 04/12/2006  . CAD (coronary artery disease)   . Carotid stenosis    dopplers 11/11: 0-39% bilat  . CAROTID STENOSIS 12/01/2008   Qualifier: Diagnosis of  By: Percival Spanish, MD, Farrel Gordon    . Chest pain, unspecified 06/16/2010  . Chronic venous  insufficiency 05/21/2002  . DVT 11/27/2008   Qualifier: Diagnosis of  By: Percival Spanish, MD, Farrel Gordon    . GERD (gastroesophageal reflux disease)   . H/O cataract extraction 03/15/2007  . History of artificial joint 03/17/2005  . History of chicken pox   . Hyperlipidemia   . Hyperlipidemia, mild 10/08/2014       . Leg pain 10/17/2004  . Macular degeneration    Dr.DAbanzo  . Neck pain on left side 04/01/2015  . Osteoarthrosis involving more than one site but not generalized 05/30/2010   Overview:  cervical spine 05/30/10 Right shoulder  05/30/10   . Osteopenia 10/08/2014  . Other congenital anomaly of toes   . Pain in joint, pelvic region and thigh   . Peripheral edema 08/25/2014  . Postconcussive syndrome 04/01/2015  . Shortness of breath 06/16/2010    Past Surgical History:  Procedure Laterality Date  . cataract surgery    . CHOLECYSTECTOMY    . FOOT SURGERY    . hyroglossal duct cyst resection    . LEFT AND RIGHT HEART CATHETERIZATION WITH CORONARY ANGIOGRAM N/A 03/21/2013   Procedure: LEFT AND RIGHT HEART CATHETERIZATION WITH CORONARY ANGIOGRAM;  Surgeon: Minus Breeding, MD;  Location: [facility] CATH LAB;  Service: Cardiovascular;  Laterality: N/A;  . left hip replacement    . RADICAL HYSTERECTOMY    . RIGHT/LEFT HEART CATH AND CORONARY ANGIOGRAPHY N/A 09/28/2017   Procedure:  RIGHT/LEFT HEART CATH AND CORONARY ANGIOGRAPHY;  Surgeon: Burnell Blanks, MD;  Location: [facility] CV LAB;  Service: Cardiovascular;  Laterality: N/A;  . TONSILLECTOMY      Family History  Problem Relation Age of Onset  . Heart disease Mother        CHF  . Hypertension Mother 63  . Heart disease Father 38       MI  . Cancer Maternal Aunt        Breast  . Heart disease Maternal Grandmother   . Heart disease Maternal Grandfather   . Hernia Daughter   . Gallstones Daughter     Social History   Socioeconomic History  . Marital status: Divorced    Spouse name: Not on file  . Number of children: 2  .  Years of education: Not on file  . Highest education level: Not on file  Occupational History  . Occupation: Retired-Worked in Writer  Social Needs  . Financial resource strain: Not on file  . Food insecurity:    Worry: Not on file    Inability: Not on file  . Transportation needs:    Medical: Not on file    Non-medical: Not on file  Tobacco Use  . Smoking status: Never Smoker  . Smokeless tobacco: Never Used  Substance and Sexual Activity  . Alcohol use: No  . Drug use: No  . 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 Physical activity:    Days per week: Not on file    Minutes per session: Not on file  . Stress: Not on file  Relationships  . Social connections:    Talks on phone: Not on file    Gets together: Not on file    Attends religious service: Not on file    Active member of club or organization: Not on file    Attends meetings of clubs or organizations: Not on file    Relationship status: Not on file  . Intimate partner violence:    Fear of current or ex partner: Not on file    Emotionally abused: Not on file    Physically abused: Not on file    Forced sexual activity: Not on file  Other Topics Concern  . Not on file  Social History Narrative  . Not on file    Current Outpatient Medications  Medication Sig Dispense Refill  . acetaminophen (TYLENOL) 500 MG tablet Take 1 tablet by mouth every 8 (eight) hours as needed for mild pain or headache.     Marland Kitchen CALCIUM-MAGNESIUM-ZINC PO Take 2 tablets by mouth daily.    Marland Kitchen CARTIA XT 240 MG 24 hr capsule TAKE 1 CAPSULE BY MOUTH DAILY 90 capsule 0  . Cholecalciferol (VITAMIN D3) 1000 units CAPS Take 1,000 Units by mouth daily.    . famciclovir (FAMVIR) 500 MG tablet TAKE ONE TABLET BY MOUTH TWO TIMES A DAY FOR 7 DAYS 14 tablet 1  . furosemide (LASIX) 20 MG tablet TAKE ONE TABLET BY MOUTH DAILY AS NEEDED WEIGHT GAIN > 3LB IN 24 HOURS  OR INCREASED  EDEMA 45 tablet 0  . gabapentin (NEURONTIN) 800 MG tablet Take 1 tablet (800 mg total) by mouth 2 (two) times daily. 180 tablet 1  . hyoscyamine (LEVSIN SL) 0.125 MG SL tablet Place 1 tablet (0.125 mg total) under the tongue every 4 (four) hours as needed. 30 tablet 0  .  metoprolol tartrate (LOPRESSOR) 25 MG tablet TAKE 1 TABLET BY MOUTH 2 TIMES DAILY 180 tablet 0  . Multiple Vitamin (MULTIVITAMIN) tablet Take 1 tablet by mouth daily.      . multivitamin-lutein (OCUVITE-LUTEIN) CAPS capsule Take 1 capsule by mouth daily.    . Omega 3 1200 MG CAPS Take 1,200 mg by mouth daily. Reported on 03/24/2015    . omeprazole (PRILOSEC) 40 MG capsule TAKE ONE CAPSULE BY MOUTH ONCE DAILY 90 capsule 5  . Polyethyl Glycol-Propyl Glycol (SYSTANE OP) Apply 1 drop to eye 2 (two) times daily as needed. For dry eyes    . Probiotic Product (PHILLIPS COLON HEALTH PO) Take 1 capsule by mouth daily.    . traMADol (ULTRAM) 50 MG tablet Take 1 tablet (50 mg total) by mouth every 12 (twelve) hours as needed for moderate pain. 40 tablet 0  . warfarin (COUMADIN) 5 MG tablet TAKE DAILY AS DIRECTED BY COUMADIN CLINIC (Patient taking differently: Take 2.5-5 mg by mouth See admin instructions. Take 45my by mouth Monday, Wednesday, Thursday, Friday and Sunday. On Tuesday and Saturday take 2.5mg ) 90 tablet 2   No current facility-administered medications for this visit.     Allergies  Allergen Reactions  . Indomethacin Nausea And Vomiting    Caused chest pain.  . Codeine Nausea And Vomiting      Review of Systems:   General:  normal appetite, decreased energy, no weight gain, no weight loss, no fever  Cardiac:  no chest pain with exertion, no chest pain at rest, +SOB with  exertion, no resting SOB, no PND, no orthopnea, no palpitations, + arrhythmia, + atrial fibrillation, + LE edema, + dizzy spells, no syncope  Respiratory:  + exertional shortness of breath, no home oxygen, no productive cough, no dry cough, no bronchitis,  no wheezing, no hemoptysis, no asthma, no pain with inspiration or cough, no sleep apnea, no CPAP at night  GI:   no difficulty swallowing, no reflux, no frequent heartburn, no hiatal hernia, no abdominal pain, no constipation, no diarrhea, no hematochezia, no hematemesis, no melena  GU:   no dysuria,  no frequency, no urinary tract infection, no hematuria, no kidney stones, no kidney disease  Vascular:  no pain suggestive of claudication, no pain in feet, no leg cramps, no varicose veins, remote DVT, no non-healing foot ulcer  Neuro:   no stroke, no TIA's, no seizures, no headaches, no temporary blindness one eye,  no slurred speech, no peripheral neuropathy, no chronic pain, + instability of gait, no memory/cognitive dysfunction, chronic weakness in both legs.  Musculoskeletal: no arthritis, no joint swelling, no myalgias, some difficulty walking, decreased mobility   Skin:   no rash, no itching, no skin infections, no pressure sores or ulcerations  Psych:   no anxiety, no depression, no nervousness, no unusual recent stress  Eyes:   no blurry vision, no floaters, no recent vision changes,  wears glasses or contacts  ENT:   no hearing loss, no loose or painful teeth, full dentures,   Hematologic:  no easy bruising, no abnormal bleeding, no clotting disorder, no frequent epistaxis  Endocrine:  no diabetes, does not check CBG's at home         Physical Exam:   BP 130/65 (BP Location: Right Arm, Patient Position: Sitting, Cuff Size: Large)   Pulse (!) 54   Resp 16   Ht 5\' 4"  (1.626 m)   Wt 165 lb (74.8 kg)   SpO2 94% Comment: ON RA  BMI 28.32 kg/m   General:  Elderly but  well-appearing  HEENT:  Unremarkable, NCAT, PERLA, EOMI, oropharynx clear.   Neck:   no JVD, no bruits, no adenopathy or thyromegaly  Chest:   clear to auscultation, symmetrical breath sounds, no wheezes, no rhonchi   CV:   RRR, grade III/VI crescendo/decrescendo murmur heard best at RSB,  no diastolic  murmur  Abdomen:  soft, non-tender, no masses or organomegaly  Extremities:  warm, well-perfused, pulses diminished but palpable in feet, Mild LE edema bilaterally in ankles and feet  Rectal/GU  Deferred  Neuro:   Grossly non-focal and symmetrical throughout  Skin:   Clean and dry, no rashes, no breakdown   Diagnostic Tests:    [facility]                        [address], [place] [address]                            [phone]  ------------------------------------------------------------------- Transthoracic Echocardiography  Patient:    Brian, Kocourek MR #:       [id number] Study Date: 08/28/2017 Gender:     F Age:        55 Height:     162.6 cm Weight:     75.8 kg BSA:        1.87 m^2 Pt. Status: Room:   ORDERING     Minus Breeding, MD  REFERRING    Minus Breeding, MD  REFERRING    Penni Homans A  PERFORMING   [facility], Outpatient  SONOGRAPHER  [facility], RDCS  ATTENDING    Harrell Gave, Bridgette  cc:  ------------------------------------------------------------------- LV EF: 60% -   65%  ------------------------------------------------------------------- Indications:      Atrial Fibrillation (I48.0).  ------------------------------------------------------------------- History:   PMH:   Dyspnea.  Atrial fibrillation.  Aortic valve disease.  Risk factors:  Family history of coronary artery disease. Dyslipidemia.  ------------------------------------------------------------------- Study Conclusions  - Left ventricle: The cavity size was normal. There was mild focal   basal hypertrophy of the septum. Systolic function was normal.   The estimated ejection fraction was in the range of 60% to 65%.   Wall motion was normal; there were no regional wall motion   abnormalities. The study is indeterminate for the evaluation of   LV diastolic function. - Aortic valve: Valve mobility was moderate to severely  restricted.   In parasternal short axis view, only noncoronary cusp appears to   significantly open. There was severe stenosis. There was trivial   regurgitation. Peak velocity (S): 472 cm/s. Mean gradient (S): 46   mm Hg. Peak gradient (S): 89 mm Hg. Valve area (VTI): 1.11 cm^2.   Valve area (Vmax): 0.92 cm^2. Valve area (Vmean): 1.03 cm^2. - Mitral valve: There was no evidence for stenosis. There was   trivial regurgitation. - Left atrium: The atrium was mildly dilated. - Right ventricle: The cavity size was mildly dilated. Wall   thickness was normal. - Tricuspid valve: There was mild regurgitation. - Pulmonic valve: There was no significant regurgitation. - Pulmonary arteries: PA peak pressure: 41 mm Hg (S).  Impressions:  - Severe aortic stenosis based on peak velocity >4 m/s and   gradients. Valve is calcified with restricted mobility.  Progression since last echo. Normal LV EF, no other significant   valvular disease.  ------------------------------------------------------------------- Study data:  Comparison was made to the study of 08/30/2016.  Study status:  Routine.  Procedure:  Transthoracic echocardiography. Image quality was adequate.  Study completion:  There were no complications.          Transthoracic echocardiography.  M-mode, complete 2D, spectral Doppler, and color Doppler.  Birthdate: Patient birthdate: [birth year].  Age:  Patient is 77 yr old.  Sex: Gender: female.    BMI: 28.7 kg/m^2.  Blood pressure:     120/68 Patient status:  Outpatient.  Study date:  Study date: 08/28/2017. Study time: 11:37 AM.  Location:  [facility]  -------------------------------------------------------------------  ------------------------------------------------------------------- Left ventricle:  The cavity size was normal. There was mild focal basal hypertrophy of the septum. Systolic function was normal. The estimated ejection fraction was in the range of 60% to  65%. Wall motion was normal; there were no regional wall motion abnormalities. The study is indeterminate for the evaluation of LV diastolic function.  ------------------------------------------------------------------- Aortic valve:   Trileaflet; moderately thickened, moderately calcified leaflets. Valve mobility was moderate to severely restricted. In parasternal short axis view, only noncoronary cusp appears to significantly open.  Doppler:   There was severe stenosis.   There was trivial regurgitation.    VTI ratio of LVOT to aortic valve: 0.22. Valve area (VTI): 1.11 cm^2. Indexed valve area (VTI): 0.61 cm^2/m^2. Peak velocity ratio of LVOT to aortic valve: 0.22. Valve area (Vmax): 0.92 cm^2. Indexed valve area (Vmax): 0.49 cm^2/m^2. Mean velocity ratio of LVOT to aortic valve: 0.25. Valve area (Vmean): 1.03 cm^2. Indexed valve area (Vmean): 0.55 cm^2/m^2.    Mean gradient (S): 46 mm Hg. Peak gradient (S): 89 mm Hg.  ------------------------------------------------------------------- Aorta:  Aortic root: The aortic root was normal in size.  ------------------------------------------------------------------- Mitral valve:   Mildly thickened leaflets . Mobility was not restricted.  Doppler:   There was no evidence for stenosis.   There was trivial regurgitation.    Valve area by pressure half-time: 1.93 cm^2. Indexed valve area by pressure half-time: 1.03 cm^2/m^2.    Peak gradient (D): 3 mm Hg.  ------------------------------------------------------------------- Left atrium:  The atrium was mildly dilated.  ------------------------------------------------------------------- Right ventricle:  The cavity size was mildly dilated. Wall thickness was normal. Systolic function was normal.  ------------------------------------------------------------------- Pulmonic valve:    The valve appears to be grossly normal. Doppler:  Transvalvular velocity was within the normal range.  There was no evidence for stenosis. There was no significant regurgitation.  ------------------------------------------------------------------- Tricuspid valve:   Structurally normal valve.    Doppler: Transvalvular velocity was within the normal range. There was mild regurgitation.  ------------------------------------------------------------------- Pulmonary artery:   The main pulmonary artery was normal-sized.  ------------------------------------------------------------------- Right atrium:  The atrium was normal in size.  ------------------------------------------------------------------- Pericardium:  There was no pericardial effusion.  ------------------------------------------------------------------- Systemic veins: Inferior vena cava: The vessel was normal in size.  ------------------------------------------------------------------- Measurements   Left ventricle                           Value          Reference  LV ID, ED, PLAX chordal          (L)     36    mm       43 - 52  LV ID, ES, PLAX chordal          (  L)     21    mm       23 - 38  LV fx shortening, PLAX chordal           42    %        >=29  LV PW thickness, ED                      11    mm       ----------  IVS/LV PW ratio, ED                      0.91           <=1.3  Stroke volume, 2D                        131   ml       ----------  Stroke volume/bsa, 2D                    70    ml/m^2   ----------  LV e&', lateral                           7.94  cm/s     ----------  LV E/e&', lateral                         10.33          ----------  LV e&', medial                            5.98  cm/s     ----------  LV E/e&', medial                          13.71          ----------  LV e&', average                           6.96  cm/s     ----------  LV E/e&', average                         11.78          ----------    Ventricular septum                       Value          Reference  IVS thickness, ED                         10    mm       ----------    LVOT                                     Value          Reference  LVOT ID, S                               23    mm       ----------  LVOT area                                4.15  cm^2     ----------  LVOT peak velocity, S                    105   cm/s     ----------  LVOT mean velocity, S                    77.1  cm/s     ----------  LVOT VTI, S                              31.6  cm       ----------    Aortic valve                             Value          Reference  Aortic valve peak velocity, S            472   cm/s     ----------  Aortic valve mean velocity, S            312   cm/s     ----------  Aortic valve VTI, S                      118   cm       ----------  Aortic mean gradient, S                  46    mm Hg    ----------  Aortic peak gradient, S                  89    mm Hg    ----------  VTI ratio, LVOT/AV                       0.22           ----------  Aortic valve area, VTI                   1.11  cm^2     ----------  Aortic valve area/bsa, VTI               0.61  cm^2/m^2 ----------  Velocity ratio, peak, LVOT/AV            0.22           ----------  Aortic valve area, peak velocity         0.92  cm^2     ----------  Aortic valve area/bsa, peak              0.49  cm^2/m^2 ----------  velocity  Velocity ratio, mean, LVOT/AV            0.25           ----------  Aortic valve area, mean velocity         1.03  cm^2     ----------  Aortic valve area/bsa, mean              0.55  cm^2/m^2 ----------  velocity    Aorta  Value          Reference  Aortic root ID, ED                       32    mm       ----------  Ascending aorta ID, A-P, S               38    mm       ----------    Left atrium                              Value          Reference  LA ID, A-P, ES                           38    mm       ----------  LA ID/bsa, A-P                           2.03  cm/m^2   <=2.2  LA volume, S                              52.2  ml       ----------  LA volume/bsa, S                         27.9  ml/m^2   ----------  LA volume, ES, 1-p A4C                   56.6  ml       ----------  LA volume/bsa, ES, 1-p A4C               30.2  ml/m^2   ----------  LA volume, ES, 1-p A2C                   48.1  ml       ----------  LA volume/bsa, ES, 1-p A2C               25.7  ml/m^2   ----------    Mitral valve                             Value          Reference  Mitral E-wave peak velocity              82    cm/s     ----------  Mitral A-wave peak velocity              95.6  cm/s     ----------  Mitral deceleration time         (H)     391   ms       150 - 230  Mitral pressure half-time                114   ms       ----------  Mitral peak gradient, D                  3     mm Hg    ----------  Mitral E/A ratio, peak  0.9            ----------  Mitral valve area, PHT, DP               1.93  cm^2     ----------  Mitral valve area/bsa, PHT, DP           1.03  cm^2/m^2 ----------    Pulmonary arteries                       Value          Reference  PA pressure, S, DP               (H)     41    mm Hg    <=30    Tricuspid valve                          Value          Reference  Tricuspid regurg peak velocity           309   cm/s     ----------  Tricuspid peak RV-RA gradient            38    mm Hg    ----------    Right atrium                             Value          Reference  RA ID, S-I, ES, A4C                      45    mm       34 - 49  RA area, ES, A4C                         13.8  cm^2     8.3 - 19.5  RA volume, ES, A/L                       34.3  ml       ----------  RA volume/bsa, ES, A/L                   18.3  ml/m^2   ----------    Systemic veins                           Value          Reference  Estimated CVP                            3     mm Hg    ----------    Right ventricle                          Value          Reference  TAPSE                                     23.3  mm       ----------  RV pressure, S, DP               (  H)     41    mm Hg    <=30  RV s&', lateral, S                        14    cm/s     ----------  Legend: (L)  and  (H)  mark values outside specified reference range.  ------------------------------------------------------------------- Prepared and Electronically Authenticated by  Buford Dresser 2019-07-23T14:52:33   Physicians   Panel Physicians Referring Physician Case Authorizing Physician  Burnell Blanks, MD (Primary)    Procedures   RIGHT/LEFT HEART CATH AND CORONARY ANGIOGRAPHY  Conclusion     Ost 2nd Mrg lesion is 20% stenosed.  Ost LAD to Prox LAD lesion is 20% stenosed.  Prox LAD lesion is 30% stenosed.  Ost LM to Mid LM lesion is 20% stenosed.  Ost 1st Diag lesion is 70% stenosed.   1. Mild non-obstructive CAD. The small caliber diagonal branch has a moderate stenosis, too small for PCI.  2. Severe aortic stenosis by echo findings. By cath mean gradient 26 mmHg, peak to peak gradient 21 mmHg, AVA 1.38 cm2.   Recommendations: Will continue workup for TAVR. Resume coumadin tonight.     Indications   Severe aortic stenosis [I35.0 (ICD-10-CM)]  Procedural Details/Technique   Technical Details Indication: 77 yo female with CAD, severe aortic stenosis, workup for TAVR  Procedure: The risks, benefits, complications, treatment options, and expected outcomes were discussed with the patient. The patient and/or family concurred with the proposed plan, giving informed consent. The patient was brought to the cath lab after IV hydration was given. The patient was sedated with Versed and Fentanyl. The IV catheter in the right wrist was changed for a 5 French sheath. Right heart cath performed with a balloon tipped catheter. The right wrist was prepped and draped in a sterile fashion. 1% lidocaine was used for local anesthesia. Using the modified Seldinger access technique, a 5  French sheath was placed in the right radial artery. 3 mg Verapamil was given through the sheath. 4000 units IV heparin was given. Standard diagnostic catheters were used to perform selective coronary angiography. I crossed the aortic valve with an AL-1 and a straight wire. LV pressures measured. The sheath was removed from the right radial artery and a Terumo hemostasis band was applied at the arteriotomy site on the right wrist.     Estimated blood loss <50 mL.  During this procedure the patient was administered the following to achieve and maintain moderate conscious sedation: Versed 1 mg, Fentanyl 25 mcg, while the patient's heart rate, blood pressure, and oxygen saturation were continuously monitored. The period of conscious sedation was 29 minutes, of which I was present face-to-face 100% of this time.  Complications   Complications documented before study signed (09/28/2017 8:33 AM EDT)    RIGHT/LEFT HEART CATH AND CORONARY ANGIOGRAPHY   None Documented by Burnell Blanks, MD 09/28/2017 8:29 AM EDT  Time Range: Intraprocedure      Coronary Findings   Diagnostic  Dominance: Left  Left Main  Ost LM to Mid LM lesion 20% stenosed  Ost LM to Mid LM lesion is 20% stenosed.  Left Anterior Descending  Vessel is large.  Ost LAD to Prox LAD lesion 20% stenosed  Ost LAD to Prox LAD lesion is 20% stenosed.  Prox LAD lesion 30% stenosed  Prox LAD lesion is 30% stenosed. The lesion is calcified.  First Diagonal Branch  Vessel is small  in size.  Ost 1st Diag lesion 70% stenosed  Ost 1st Diag lesion is 70% stenosed.  Second Diagonal Branch  Vessel is moderate in size.  Left Circumflex  Vessel is large.  First Obtuse Marginal Branch  Vessel is large in size.  Second Obtuse Marginal Branch  Vessel is moderate in size.  Ost 2nd Mrg lesion 20% stenosed  Ost 2nd Mrg lesion is 20% stenosed.  Right Coronary Artery  Vessel is moderate in size.  Intervention   No interventions  have been documented.  Coronary Diagrams   Diagnostic Diagram       Implants    No implant documentation for this case.  MERGE Images   Show images for CARDIAC CATHETERIZATION   Link to Procedure Log   Procedure Log    Hemo Data    Most Recent Value  Fick Cardiac Output 6.21 L/min  Fick Cardiac Output Index 3.4 (L/min)/BSA  Aortic Mean Gradient 26 mmHg  Aortic Peak Gradient 21 mmHg  Aortic Valve Area 1.36  Aortic Value Area Index 0.75 cm2/BSA  RA A Wave 4 mmHg  RA V Wave 2 mmHg  RA Mean 1 mmHg  RV Systolic Pressure 31 mmHg  RV Diastolic Pressure 2 mmHg  RV EDP 6 mmHg  PA Systolic Pressure 27 mmHg  PA Diastolic Pressure 9 mmHg  PA Mean 18 mmHg  PW A Wave 15 mmHg  PW V Wave 14 mmHg  PW Mean 11 mmHg  AO Systolic Pressure 431 mmHg  AO Diastolic Pressure 59 mmHg  AO Mean 89 mmHg  LV Systolic Pressure 540 mmHg  LV Diastolic Pressure 7 mmHg  LV EDP 18 mmHg  AOp Systolic Pressure 086 mmHg  AOp Diastolic Pressure 64 mmHg  AOp Mean Pressure 99 mmHg  LVp Systolic Pressure 761 mmHg  LVp Diastolic Pressure 11 mmHg  LVp EDP Pressure 18 mmHg  QP/QS 1  TPVR Index 5.29 HRUI  TSVR Index 26.15 HRUI  PVR SVR Ratio 0.08  TPVR/TSVR Ratio 0.2    ADDENDUM REPORT: 10/22/2017 11:15  CLINICAL DATA:  Aortic stenosis  EXAM: Cardiac TAVR CT  TECHNIQUE: The patient was scanned on a [employer] scanner. A 120 kV retrospective scan was triggered in the descending thoracic aorta at 111 HU's. Gantry rotation speed was 270 msecs and collimation was .9 mm. No beta blockade or nitro were given. The 3D data set was reconstructed in 5% intervals of the R-R cycle. Systolic and diastolic phases were analyzed on a dedicated work station using MPR, MIP and VRT modes. The patient received 80 cc of contrast.  FINDINGS: Aortic Valve: Calcified tri leaflet with restricted motion  Aorta: Moderate calcific atherosclerosis  Sinotubular Junction: 30 mm  Ascending Thoracic Aorta:  34 mm  Aortic Arch: Not well seen  Descending Thoracic Aorta: 24 mm  Sinus of Valsalva Measurements:  Non-coronary: 30.8 mm  Right - coronary: 29.1 mm  Left - coronary: 31.8 mm  Coronary Artery Height above Annulus:  Left Main: 10.8 mm above annulus  Right Coronary: 12.7 mm above annulus  Virtual Basal Annulus Measurements:  Maximum/Minimum Diameter: 21.7 mm x 28.3 mm  Perimeter: 79.8 mm  Area: 501.5 mm2  Coronary Arteries: Sufficient height above annulus for deployment  Optimum Fluoroscopic Angle for Delivery: LAO 14 Caudal 14 degrees  IMPRESSION: 1. Calcified tri leaflet AV with annular area of 501.5 mm2 suitable for a 26 mm Sapien 3 valve  2. Optimum angiographic angle for deployment LAO 14 Caudal 14 degrees  3.  Coronary arteries  sufficient height above annulus for deployment  4.  Normal aortic root diameter 3.4 cm  5.  No LAA thrombus.  Jenkins Rouge   Electronically Signed   By: Jenkins Rouge M.D.   On: 10/22/2017 11:15       CLINICAL DATA:  77 year old female with history of severe aortic stenosis. Preprocedural study prior to potential transcatheter aortic valve replacement (TAVR) procedure.  EXAM: CT ANGIOGRAPHY CHEST, ABDOMEN AND PELVIS  TECHNIQUE: Multidetector CT imaging through the chest, abdomen and pelvis was performed using the standard protocol during bolus administration of intravenous contrast. Multiplanar reconstructed images and MIPs were obtained and reviewed to evaluate the vascular anatomy.  CONTRAST:  142mL ISOVUE-370 IOPAMIDOL (ISOVUE-370) INJECTION 76%  COMPARISON:  None.  FINDINGS: CTA CHEST FINDINGS  Cardiovascular: Heart size is mildly enlarged. There is no significant pericardial fluid, thickening or pericardial calcification. There is aortic atherosclerosis, as well as atherosclerosis of the great vessels of the mediastinum and the coronary arteries, including calcified  atherosclerotic plaque in the left main and left anterior descending coronary arteries. Severe thickening calcifications of the aortic valve.  Mediastinum/Lymph Nodes: No pathologically enlarged mediastinal or hilar lymph nodes. Esophagus is unremarkable in appearance. No axillary lymphadenopathy.  Lungs/Pleura: No suspicious pulmonary nodules or masses. No acute consolidative airspace disease. No pleural effusions.  Musculoskeletal/Soft Tissues: There are no aggressive appearing lytic or blastic lesions noted in the visualized portions of the skeleton.  CTA ABDOMEN AND PELVIS FINDINGS  Hepatobiliary: 9 mm low-attenuation lesion in segment 4A of the liver, too small to characterize, but statistically likely a cyst. 2.1 cm low-attenuation lesion in the periphery of segment 8 of the liver, compatible with a simple cyst. Status post cholecystectomy. Moderate to severe intrahepatic biliary ductal dilatation. Common hepatic duct measures up to 24 mm in diameter. Common bile duct is also dilated measuring 9 mm in the porta hepatis. On coronal image 69 of series 18 and axial image 111 of series 16 there is evidence of potential noncalcified stone in the distal common bile duct, however, this is not definitive.  Pancreas: No pancreatic mass. No pancreatic ductal dilatation. No pancreatic or peripancreatic fluid or inflammatory changes.  Spleen: Unremarkable.  Adrenals/Urinary Tract: 2.8 cm low-attenuation lesion in the medial aspect of the interpolar region of the right kidney, compatible with a simple cyst. Left kidney and bilateral adrenal glands are normal in appearance. No hydroureteronephrosis. Urinary bladder is normal in appearance.  Stomach/Bowel: Normal appearance of the stomach. No pathologic dilatation of small bowel or colon. A few scattered colonic diverticulae are noted, without surrounding inflammatory changes to suggest an acute diverticulitis at this time.  Normal appendix.  Vascular/Lymphatic: Aortic atherosclerosis, with vascular findings and measurements pertinent to potential TAVR procedure, as detailed below. No aneurysm or dissection noted in the abdominal or pelvic vasculature. No lymphadenopathy noted in the abdomen or pelvis.  Reproductive: Status post hysterectomy. Ovaries are not confidently identified may be surgically absent or atrophic.  Other: No significant volume of ascites.  No pneumoperitoneum.  Musculoskeletal: Status post left hip arthroplasty. There are no aggressive appearing lytic or blastic lesions noted in the visualized portions of the skeleton.  VASCULAR MEASUREMENTS PERTINENT TO TAVR:  AORTA:  Minimal Aortic Diameter-15 x 13 mm  Severity of Aortic Calcification-moderate to severe  RIGHT PELVIS:  Right Common Iliac Artery -  Minimal Diameter-8.5 x 7.5 mm  Tortuosity-moderate  Calcification-moderate  Right External Iliac Artery -  Minimal Diameter-6.7 x 6.8 mm  Tortuosity-moderate  Calcification-none  Right Common Femoral  Artery -  Minimal Diameter-6.6 x 7.3 mm  Tortuosity-mild  Calcification-mild  LEFT PELVIS:  Left Common Iliac Artery -  Minimal Diameter-8.2 x 7.2 mm  Tortuosity-severe  Calcification-mild-to-moderate  Left External Iliac Artery -  Minimal Diameter-6.1 x 6.5 mm  Tortuosity-moderate  Calcification-none  Left Common Femoral Artery -  Minimal Diameter-6.3 x 7.9 mm  Tortuosity-mild  Calcification-mild  Review of the MIP images confirms the above findings.  IMPRESSION: 1. Vascular findings and measurements pertinent to potential TAVR procedure, as detailed above. 2. Severe thickening calcification of the aortic valve, compatible with the reported clinical history of severe aortic stenosis. 3. Aortic atherosclerosis, in addition to left main and left anterior descending coronary artery disease. Assessment  for potential risk factor modification, dietary therapy or pharmacologic therapy may be warranted, if clinically indicated. 4. Status post cholecystectomy with extensive biliary tract dilatation and possible noncalcified choledocholithiasis in the distal common bile duct. If there is any clinical concern or biochemical evidence of biliary tract obstruction, further evaluation with nonemergent MRI of the abdomen with and without IV gadolinium with MRCP should be considered in the near future. 5. Colonic diverticulosis without evidence of acute diverticulitis at this time. 6. Additional incidental findings, as above.   Electronically Signed   By: Vinnie Langton M.D.   On: 10/22/2017 11:48   STS Risk Score:  Procedure: Isolated AVR   Risk of Mortality: 1.926%  Renal Failure: 0.956%  Permanent Stroke: 1.007%  Prolonged Ventilation: 4.926%  DSW Infection: 0.082%  Reoperation: 2.868%  Morbidity or Mortality: 8.423%  Short Length of Stay: 33.589%  Long Length of Stay: 4.674%  Impression:  This 77 year old woman has stage D, severe, symptomatic aortic stenosis with New York Heart Association class II-III symptoms of exertional fatigue and shortness of breath consistent with chronic diastolic congestive heart failure.  She is also had frequent episodes of dizziness with ambulation and some lower extremity edema.  I have personally reviewed her echocardiogram, cardiac cath, and CTA studies.  Her echocardiogram shows a trileaflet aortic valve with moderate thickening and calcification and severely reduced mobility.  The mean gradient has increased significantly over the past year to 46 mmHg with a dimensionless index of 0.22 consistent with severe aortic stenosis.  Left ventricular ejection fraction is 60 to 65%.  Cardiac catheterization shows mild nonobstructive coronary disease.  The mean gradient across aortic valve the catheterization was 26 mmHg and the peak to peak gradient was 21  mmHg.  I think aortic valve replacement is indicated in this patient with progressive symptoms that are limiting her ability to be active.  She would be a low risk candidate for open surgical aortic valve replacement based on the STS PROM score but think she would have a prolonged recovery due to her age and bilateral chronic leg weakness and inability to use her legs to get herself up out of a chair.  She is very dependent on her upper body for mobilization which would be a significant problem after a sternotomy.  I think transcatheter aortic valve replacement would be the best option for this patient.  Her gated cardiac CTA shows anatomy suitable for transcatheter aortic valve replacement with no complicating features.  Her abdominal and pelvic CTA shows adequate pelvic vascular anatomy for transfemoral insertion.  The patient and her family were counseled at length regarding treatment alternatives for management of severe symptomatic aortic stenosis. The risks and benefits of surgical intervention has been discussed in detail. Long-term prognosis with medical therapy was discussed. Alternative  approaches such as conventional surgical aortic valve replacement, transcatheter aortic valve replacement, and palliative medical therapy were compared and contrasted at length. This discussion was placed in the context of the patient's own specific clinical presentation and past medical history. All of their questions have been addressed.   Following the decision to proceed with transcatheter aortic valve replacement, a discussion was held regarding what types of management strategies would be attempted intraoperatively in the event of life-threatening complications, including whether or not the patient would be considered a candidate for the use of cardiopulmonary bypass and/or conversion to open sternotomy for attempted surgical intervention.    The patient has been advised of a variety of complications that might  develop including but not limited to risks of death, stroke, paravalvular leak, aortic dissection or other major vascular complications, aortic annulus rupture, device embolization, cardiac rupture or perforation, mitral regurgitation, acute myocardial infarction, arrhythmia, heart block or bradycardia requiring permanent pacemaker placement, congestive heart failure, respiratory failure, renal failure, pneumonia, infection, other late complications related to structural valve deterioration or migration, or other complications that might ultimately cause a temporary or permanent loss of functional independence or other long term morbidity. The patient provides full informed consent for the procedure as described and all questions were answered.     Plan:  She will be scheduled for transcatheter aortic valve replacement using a Sapien 3 valve on 11/13/2017.  Her Coumadin will be stopped 5 days preoperatively and then restarted postoperatively.   I spent 80 minutes performing this consultation and > 50% of this time was spent face to face counseling and coordinating the care of this patient's severe symptomatic aortic stenosis.     Gaye Pollack, MD 10/24/2017 11:49 AM

## 2017-10-24 NOTE — Therapy (Signed)
[place], [place], [address] Phone: [phone]   Fax:  [phone]  Physical Therapy Evaluation  Patient Details  Name: Mikayla [name] MRN: [id number] Date of Birth: Nov 08, 1940 Referring Provider: Angelena Form PA-C   Encounter Date: 10/24/2017  PT End of Session - 10/24/17 0940    Visit Number  1    Number of Visits  1    Date for PT Re-Evaluation  10/24/17    PT Start Time  0846    PT Stop Time  0930    PT Time Calculation (min)  44 min    Equipment Utilized During Treatment  Gait belt    Activity Tolerance  Patient tolerated treatment well    Behavior During Therapy  [facility] for tasks assessed/performed       Past Medical History:  Diagnosis Date  . Abdominal pain 08/25/2014  . Adiposity 07/28/2013  . Anemia   . Aortic stenosis    echo 11/11: EF 60%, mild LVH, mod/severe AS with mean 32 mmHg, peak 55 mmHg, mild AI, mild MR, (previous echo 2010 with mean gradient 29 mmHg)  . Aortic valve disorder 12/01/2008   Qualifier: Diagnosis of  By: Percival Spanish, MD, Farrel Gordon     . Arthritis, degenerative 05/21/2002  . Atrial fibrillation ([place])   . Atrial tachycardia ([place])   . Benign essential HTN 04/17/2006  . Bilateral dry eyes   . Bone/cartilage disorder 04/26/2001  . Brain aneurysm 01/16/2015  . Buzzing in ear 04/12/2006  . CAD (coronary artery disease)   . Carotid stenosis    dopplers 11/11: 0-39% bilat  . CAROTID STENOSIS 12/01/2008   Qualifier: Diagnosis of  By: Percival Spanish, MD, Farrel Gordon    . Chest pain, unspecified 06/16/2010  . Chronic venous insufficiency 05/21/2002  . DVT 11/27/2008   Qualifier: Diagnosis of  By: Percival Spanish, MD, Farrel Gordon    . GERD (gastroesophageal reflux disease)   . H/O cataract extraction 03/15/2007  . History of artificial joint 03/17/2005  . History of chicken pox   . Hyperlipidemia   . Hyperlipidemia, mild 10/08/2014       . Leg pain 10/17/2004  . Macular degeneration     Dr.DAbanzo  . Neck pain on left side 04/01/2015  . Osteoarthrosis involving more than one site but not generalized 05/30/2010   Overview:  cervical spine 05/30/10 Right shoulder  05/30/10   . Osteopenia 10/08/2014  . Other congenital anomaly of toes   . Pain in joint, pelvic region and thigh   . Peripheral edema 08/25/2014  . Postconcussive syndrome 04/01/2015  . Shortness of breath 06/16/2010    Past Surgical History:  Procedure Laterality Date  . cataract surgery    . CHOLECYSTECTOMY    . FOOT SURGERY    . hyroglossal duct cyst resection    . LEFT AND RIGHT HEART CATHETERIZATION WITH CORONARY ANGIOGRAM N/A 03/21/2013   Procedure: LEFT AND RIGHT HEART CATHETERIZATION WITH CORONARY ANGIOGRAM;  Surgeon: Minus Breeding, MD;  Location: [facility] CATH LAB;  Service: Cardiovascular;  Laterality: N/A;  . left hip replacement    . RADICAL HYSTERECTOMY    . RIGHT/LEFT HEART CATH AND CORONARY ANGIOGRAPHY N/A 09/28/2017   Procedure: RIGHT/LEFT HEART CATH AND CORONARY ANGIOGRAPHY;  Surgeon: Burnell Blanks, MD;  Location: [facility] CV LAB;  Service: Cardiovascular;  Laterality: N/A;  . TONSILLECTOMY      There were no vitals filed for this visit.   Subjective Assessment - 10/24/17 4010  Subjective  pt is a 77 y.o F with CC of SOB and dizziness that started about 2 months ago. The Dizziness has progressively worsened and the SOB has stayed the same depend on activity. pt currently lives with her daughter and denies any falls. She has a hx of heart catherization and a L THA. She reports intermittent low back pain that is worse with prolonged standing / walking.     How long can you sit comfortably?  unlimited    How long can you stand comfortably?  15 min     How long can you walk comfortably?  SPC 1 hour ( as long as she can hold on to something stable)     Patient Stated Goals  to get heart better    Currently in Pain?  No/denies    Multiple Pain Sites  No         [facility] PT Assessment -  10/24/17 0902      Assessment   Medical Diagnosis  Severe Aortic Stenosis    Referring Provider  Angelena Form PA-C    Hand Dominance  Right    Prior Therapy  yes   for low back pain     Precautions   Precautions  None      Restrictions   Weight Bearing Restrictions  No      Home Environment   Living Environment  Private residence    Living Arrangements  Children    Available Help at Discharge  Family;Available PRN/intermittently    Type of Home  House    Home Access  Level entry    [place] - single point;Wheelchair - [employer] - 4 wheels;Shower seat      Prior Function   Level of Independence  Independent with basic ADLs      AROM   Overall AROM Comments  RUE limited reaching behind the back compared bil, bil limited DF R>L      Strength   Overall Strength Comments  mild weakness in the RUE 4-/5 compared to LUE, bil LE overall strength 3+/5, limit    Right Hand Grip (lbs)  40    Left Hand Grip (lbs)  35      Ambulation/Gait   Assistive device  --   SPC   Gait Pattern  Step-through pattern;Trendelenburg;Antalgic;Decreased dorsiflexion - left;Decreased dorsiflexion - right;Trunk flexed       [facility] Pre-Surgical Assessment - 10/24/17 0902    5 Meter Walk Test- trial 1  6 sec    5 Meter Walk Test- trial 2  6 sec.     5 Meter Walk Test- trial 3  6 sec.    5 meter walk test average  6 sec    4 Stage Balance Test tolerated for:   3 sec.    4 Stage Balance Test Position  2    comment  required hand held assist to help position feet    Comment  unable to perform    ADL/IADL Independent with:  Bathing;Dressing;Meal prep;Finances    ADL/IADL Needs Assistance with:  [employer] work    ADL/IADL Fraility Index  Midly frail    6 Minute Walk- Baseline  yes    BP (mmHg)  148/62    HR (bpm)  50    02 Sat (%RA)  94 %    Modified Borg Scale for Dyspnea  1- Very mild shortness of breath    Perceived Rate of Exertion (  Borg)  9- very light     6 Minute Walk Post Test  yes    BP (mmHg)  170/67    HR (bpm)  68    02 Sat (%RA)  96 %    Modified Borg Scale for Dyspnea  2- Mild shortness of breath    Perceived Rate of Exertion (Borg)  13- Somewhat hard    Aerobic Endurance Distance Walked  963    Endurance additional comments  pt demonstrates 37.47% disability compared to age related norm              Objective measurements completed on examination: See above findings.              PT Education - 10/24/17 [phone]    Education Details  to prevent potential falls to take time with walking and when turning or takeing corners go slowly to prevent dizziness, and to use SPC in the house and with short distances especiall with hx of increasing dizziness.    Person(s) Educated  Patient;Child(ren)    Methods  Explanation;Verbal cues    Comprehension  Verbalized understanding;Verbal cues required                  Plan - 10/24/17 0941    Clinical Impression Statement  See assessment in note    Clinical Presentation  Stable    Clinical Decision Making  Low    Rehab Potential  Good    PT Frequency  One time visit    PT Next Visit Plan  one time Pre-TAVR evaluation    Consulted and Agree with Plan of Care  Patient;Family member/caregiver    Family Member Consulted  daughter           Clinical Impression Statement: Pt is a 77 yo F presenting to OP PT for evaluation prior to possible TAVR surgery due to severe aortic stenosis. Pt reports onset of SOB and dizziness approximately 2 months ago. Symptoms are limiting endurance and balance. Pt presents with functional ROM except for limited R shoulder extension/ internal rotation and strength except for bil LE at 3+/5, limited balance and is moderate at high fall risk 4 stage balance test, limited walking speed and limited aerobic endurance per 6 minute walk test. Pt ambulated 963 feet in 6 min, at the end of 6 in walk  patient's HR was 68 bpm and O2 was 96 on room  air. Pt reported 2/10 shortness of breath on modified scale for dyspnea. . SOB, and intermittent dizziness increased significantly with 6 minute walk test. Based on the Short Physical Performance Battery, patient has a frailty rating of 6/12 with </= 5/12 considered frail.    Patient demonstrated  the following deficits and impairments:     Visit Diagnosis: Other abnormalities of gait and mobility     Problem List Patient Active Problem List   Diagnosis Date Noted  . Severe aortic stenosis   . Chills (without fever) 06/14/2017  . Hallux valgus of right foot 08/17/2016  . Hammer toe of right foot 08/17/2016  . Dermatitis contact, eyelid 04/16/2016  . Skin lesion of right leg 01/09/2016  . Postconcussive syndrome 04/01/2015  . Neck pain on left side 04/01/2015  . Brain aneurysm 01/16/2015  . Hyperlipidemia, mild 10/08/2014  . Osteopenia 10/08/2014  . Abdominal pain 08/25/2014  . Peripheral edema 08/25/2014  . History of DVT (deep vein thrombosis) 08/14/2014  . Anemia   . GERD (gastroesophageal reflux disease)   . Bilateral dry  eyes   . History of chicken pox   . Adiposity 07/28/2013  . Shortness of breath 06/16/2010  . Chest pain, unspecified 06/16/2010  . Osteoarthrosis involving more than one site but not generalized 05/30/2010  . Aortic valve disorder 12/01/2008  . CAROTID STENOSIS 12/01/2008  . HIP PAIN 12/01/2008  . HAMMER TOE 11/27/2008  . Palpitations 11/27/2008  . H/O cataract extraction 03/15/2007  . Benign essential HTN 04/17/2006  . Buzzing in ear 04/12/2006  . History of artificial joint 03/17/2005  . Leg pain 10/17/2004  . Arthritis, degenerative 05/21/2002  . Chronic venous insufficiency 05/21/2002  . Bone/cartilage disorder 04/26/2001    Starr Lake PT, DPT, LAT, ATC  10/24/17  9:42 AM      [facility] [address] [place], [place], [address] Phone: [phone]   Fax:   [phone]  Name: Mikayla [name] MRN: [id number] Date of Birth: 26-Feb-1940

## 2017-10-25 ENCOUNTER — Encounter: Payer: Self-pay | Admitting: Surgery

## 2017-10-30 ENCOUNTER — Other Ambulatory Visit: Payer: Self-pay

## 2017-10-30 ENCOUNTER — Telehealth: Payer: Self-pay

## 2017-10-30 DIAGNOSIS — I350 Nonrheumatic aortic (valve) stenosis: Secondary | ICD-10-CM

## 2017-10-30 NOTE — Telephone Encounter (Signed)
I contacted the pt to review pre TAVR instructions.  The pt is scheduled for Transfemoral TAVR on 11/13/2017 with Drs Angelena Form and Cyndia Bent.   While reviewing instructions with the pt she made me aware of symptoms she had during the night and this morning.  The pt states she woke up at 1:30 AM to urinate and had to urinate every hour after until she finally got up at 5:30 AM with SOB and nausea.  The pt also noticed her heart was racing during the night. The pt did not check her BP, pulse or weight and she does not have a home BP cuff. The pt denies swelling but has noticed that her shoes are tighter.  At this time the pt is feeling better.  I advised her to weigh herself today (normally weighs weekly) and she has PRN Furosemide prescribed.  The pt plans to take a Furosemide today and will continue to monitor for symptoms.  If the pt has further symptoms she will contact Dr Hochrein's office for evaluation.

## 2017-11-07 NOTE — Pre-Procedure Instructions (Signed)
Tyneisha Hegeman  11/07/2017      [facility] [place], [address] [place] [address] Phone: [phone] Fax: [phone]    Your procedure is scheduled on October 8th.  Report to [facility] Admitting at [email or web address].  Call this number if you have problems the morning of surgery:  [phone]   Remember:  Do not eat or drink after midnight.      Continue taking all your medications as scheduled.  Do not take any medications the morning of surgery.  Take your last dose of Coumadin on 10/2.  7 days prior to surgery STOP taking any Aspirin(unless otherwise instructed by your surgeon), Aleve, Naproxen, Ibuprofen, Motrin, Advil, Goody's, BC's, all herbal medications, fish oil, and all vitamins     Do not wear jewelry, make-up or nail polish.  Do not wear lotions, powders, or perfumes, or deodorant.  Do not shave 48 hours prior to surgery.  Men may shave face and neck.  Do not bring valuables to the hospital.  [facility] is not responsible for any belongings or valuables.  Contacts, dentures or bridgework may not be worn into surgery.  Leave your suitcase in the car.  After surgery it may be brought to your room.  For patients admitted to the hospital, discharge time will be determined by your treatment team.  Patients discharged the day of surgery will not be allowed to drive home.    [place]- Preparing For Surgery  Before surgery, you can play an important role. Because skin is not sterile, your skin needs to be as free of germs as possible. You can reduce the number of germs on your skin by washing with CHG (chlorahexidine gluconate) Soap before surgery.  CHG is an antiseptic cleaner which kills germs and bonds with the skin to continue killing germs even after washing.    Oral Hygiene is also important to reduce your risk of infection.  Remember - BRUSH YOUR TEETH THE MORNING OF SURGERY WITH YOUR REGULAR  TOOTHPASTE  Please do not use if you have an allergy to CHG or antibacterial soaps. If your skin becomes reddened/irritated stop using the CHG.  Do not shave (including legs and underarms) for at least 48 hours prior to first CHG shower. It is OK to shave your face.  Please follow these instructions carefully.   1. Shower the NIGHT BEFORE SURGERY and the MORNING OF SURGERY with CHG.   2. If you chose to wash your hair, wash your hair first as usual with your normal shampoo.  3. After you shampoo, rinse your hair and body thoroughly to remove the shampoo.  4. Use CHG as you would any other liquid soap. You can apply CHG directly to the skin and wash gently with a scrungie or a clean washcloth.   5. Apply the CHG Soap to your body ONLY FROM THE NECK DOWN.  Do not use on open wounds or open sores. Avoid contact with your eyes, ears, mouth and genitals (private parts). Wash Face and genitals (private parts)  with your normal soap.  6. Wash thoroughly, paying special attention to the area where your surgery will be performed.  7. Thoroughly rinse your body with warm water from the neck down.  8. DO NOT shower/wash with your normal soap after using and rinsing off the CHG Soap.  9. Pat yourself dry with a CLEAN TOWEL.  10. Wear CLEAN  PAJAMAS to bed the night before surgery, wear comfortable clothes the morning of surgery  11. Place CLEAN SHEETS on your bed the night of your first shower and DO NOT SLEEP WITH PETS.    Day of Surgery:  Do not apply any deodorants/lotions.  Please wear clean clothes to the hospital/surgery center.   Remember to brush your teeth WITH YOUR REGULAR TOOTHPASTE.    Please read over the following fact sheets that you were given.

## 2017-11-09 ENCOUNTER — Ambulatory Visit (HOSPITAL_COMMUNITY)
Admission: RE | Admit: 2017-11-09 | Discharge: 2017-11-09 | Disposition: A | Discharge status: Home / Self Care | Payer: Medicare Other | Source: Ambulatory Visit | Attending: Cardiovascular Disease | Admitting: Cardiovascular Disease

## 2017-11-09 ENCOUNTER — Encounter (HOSPITAL_COMMUNITY)
Admission: RE | Admit: 2017-11-09 | Discharge: 2017-11-09 | Disposition: A | Discharge status: Home / Self Care | Payer: Medicare Other | Source: Ambulatory Visit | Attending: Cardiovascular Disease | Admitting: Cardiovascular Disease

## 2017-11-09 ENCOUNTER — Encounter (HOSPITAL_COMMUNITY): Payer: Self-pay

## 2017-11-09 DIAGNOSIS — I350 Nonrheumatic aortic (valve) stenosis: Secondary | ICD-10-CM

## 2017-11-09 DIAGNOSIS — R509 Fever, unspecified: Secondary | ICD-10-CM | POA: Diagnosis not present

## 2017-11-09 HISTORY — DX: Other specified postprocedural states: R11.2

## 2017-11-09 HISTORY — DX: Other specified postprocedural states: Z98.890

## 2017-11-09 HISTORY — DX: Nausea with vomiting, unspecified: R11.2

## 2017-11-09 LAB — CBC
HCT: 38.8 % (ref 36.0–46.0)
Hemoglobin: 12.5 g/dL (ref 12.0–15.0)
MCH: 30.4 pg (ref 26.0–34.0)
MCHC: 32.2 g/dL (ref 30.0–36.0)
MCV: 94.4 fL (ref 78.0–100.0)
PLATELETS: 164 10*3/uL (ref 150–400)
RBC: 4.11 MIL/uL (ref 3.87–5.11)
RDW: 13 % (ref 11.5–15.5)
WBC: 8.3 10*3/uL (ref 4.0–10.5)

## 2017-11-09 LAB — COMPREHENSIVE METABOLIC PANEL
ALT: 16 U/L (ref 0–44)
AST: 21 U/L (ref 15–41)
Albumin: 3.5 g/dL (ref 3.5–5.0)
Alkaline Phosphatase: 64 U/L (ref 38–126)
Anion gap: 8 (ref 5–15)
BUN: 13 mg/dL (ref 8–23)
CHLORIDE: 110 mmol/L (ref 98–111)
CO2: 23 mmol/L (ref 22–32)
Calcium: 9.1 mg/dL (ref 8.9–10.3)
Creatinine, Ser: 0.65 mg/dL (ref 0.44–1.00)
Glucose, Bld: 125 mg/dL — ABNORMAL HIGH (ref 70–99)
POTASSIUM: 3.8 mmol/L (ref 3.5–5.1)
Sodium: 141 mmol/L (ref 135–145)
TOTAL PROTEIN: 6.3 g/dL — AB (ref 6.5–8.1)
Total Bilirubin: 0.6 mg/dL (ref 0.3–1.2)

## 2017-11-09 LAB — URINALYSIS, ROUTINE W REFLEX MICROSCOPIC
BILIRUBIN URINE: NEGATIVE
GLUCOSE, UA: NEGATIVE mg/dL
HGB URINE DIPSTICK: NEGATIVE
Ketones, ur: NEGATIVE mg/dL
Nitrite: NEGATIVE
Protein, ur: NEGATIVE mg/dL
SPECIFIC GRAVITY, URINE: 1.01 (ref 1.005–1.030)
pH: 5 (ref 5.0–8.0)

## 2017-11-09 LAB — BLOOD GAS, ARTERIAL
ACID-BASE EXCESS: 2.1 mmol/L — AB (ref 0.0–2.0)
BICARBONATE: 26 mmol/L (ref 20.0–28.0)
Drawn by: 421801
FIO2: 21
O2 SAT: 97.5 %
PATIENT TEMPERATURE: 98.6
PO2 ART: 95.9 mmHg (ref 83.0–108.0)
pCO2 arterial: 39.7 mmHg (ref 32.0–48.0)
pH, Arterial: 7.431 (ref 7.350–7.450)

## 2017-11-09 LAB — TYPE AND SCREEN
ABO/RH(D): A NEG
ANTIBODY SCREEN: NEGATIVE

## 2017-11-09 LAB — ABO/RH: ABO/RH(D): A NEG

## 2017-11-09 LAB — SURGICAL PCR SCREEN
MRSA, PCR: NEGATIVE
STAPHYLOCOCCUS AUREUS: POSITIVE — AB

## 2017-11-09 LAB — PROTIME-INR
INR: 2.01
PROTHROMBIN TIME: 22.6 s — AB (ref 11.4–15.2)

## 2017-11-09 LAB — APTT: aPTT: 37 seconds — ABNORMAL HIGH (ref 24–36)

## 2017-11-09 LAB — BRAIN NATRIURETIC PEPTIDE: B Natriuretic Peptide: 216.7 pg/mL — ABNORMAL HIGH (ref 0.0–100.0)

## 2017-11-09 NOTE — Progress Notes (Signed)
UA +bacteria and leukocytes, inboxed MD. Surgical PCR +MSSA, Mupirocin called in to [facility] [phone]. Patient notified.

## 2017-11-10 LAB — HEMOGLOBIN A1C
HEMOGLOBIN A1C: 5.6 % (ref 4.8–5.6)
MEAN PLASMA GLUCOSE: 114 mg/dL

## 2017-11-12 ENCOUNTER — Other Ambulatory Visit: Payer: Self-pay | Admitting: Physician Assistant

## 2017-11-12 ENCOUNTER — Telehealth: Payer: Self-pay | Admitting: Physician Assistant

## 2017-11-12 MED ORDER — SODIUM CHLORIDE 0.9 % IV SOLN
1.5000 g | INTRAVENOUS | Status: AC
  Administered 2017-11-13: 1.5 g via INTRAVENOUS
  Filled 2017-11-12: qty 1.5

## 2017-11-12 MED ORDER — VANCOMYCIN HCL 10 G IV SOLR
1250.0000 mg | INTRAVENOUS | Status: AC
  Administered 2017-11-13: 1250 mg via INTRAVENOUS
  Filled 2017-11-12: qty 1250

## 2017-11-12 MED ORDER — PHENYLEPHRINE HCL-NACL 20-0.9 MG/250ML-% IV SOLN
30.0000 ug/min | INTRAVENOUS | Status: DC
  Filled 2017-11-12: qty 250

## 2017-11-12 MED ORDER — MAGNESIUM SULFATE 50 % IJ SOLN
40.0000 meq | INTRAMUSCULAR | Status: DC
  Filled 2017-11-12: qty 9.85

## 2017-11-12 MED ORDER — NITROGLYCERIN IN D5W 200-5 MCG/ML-% IV SOLN
2.0000 ug/min | INTRAVENOUS | Status: DC
  Filled 2017-11-12: qty 250

## 2017-11-12 MED ORDER — NOREPINEPHRINE 4 MG/250ML-% IV SOLN
0.0000 ug/min | INTRAVENOUS | Status: DC
  Filled 2017-11-12: qty 250

## 2017-11-12 MED ORDER — EPINEPHRINE PF 1 MG/ML IJ SOLN
0.0000 ug/min | INTRAVENOUS | Status: DC
  Filled 2017-11-12: qty 4

## 2017-11-12 MED ORDER — CIPROFLOXACIN HCL 500 MG PO TABS: 500.0000 mg | ORAL_TABLET | Freq: Two times a day (BID) | ORAL | 0 refills | Status: DC

## 2017-11-12 MED ORDER — SODIUM CHLORIDE 0.9 % IV SOLN
INTRAVENOUS | Status: DC
  Filled 2017-11-12: qty 30

## 2017-11-12 MED ORDER — DEXMEDETOMIDINE HCL IN NACL 400 MCG/100ML IV SOLN
0.1000 ug/kg/h | INTRAVENOUS | Status: AC
  Administered 2017-11-13: 1 ug/kg/h via INTRAVENOUS
  Filled 2017-11-12: qty 100

## 2017-11-12 MED ORDER — SODIUM CHLORIDE 0.9 % IV SOLN
INTRAVENOUS | Status: DC
  Filled 2017-11-12: qty 1

## 2017-11-12 MED ORDER — DOPAMINE-DEXTROSE 3.2-5 MG/ML-% IV SOLN
0.0000 ug/kg/min | INTRAVENOUS | Status: DC
  Filled 2017-11-12: qty 250

## 2017-11-12 MED ORDER — POTASSIUM CHLORIDE 2 MEQ/ML IV SOLN
80.0000 meq | INTRAVENOUS | Status: DC
  Filled 2017-11-12: qty 40

## 2017-11-12 NOTE — H&P (Signed)
[address]       [place] [address]             [phone]      Cardiothoracic Surgery Admission History and Physical   Referring Provider is Minus Breeding, MD  PCP is Mosie Lukes, MD      Chief Complaint  Patient presents with  . Aortic Stenosis       HPI:  The patient is a 77 year old woman with a history of hypertension,hyperlipidemia, atrial fibrillation on Coumadin, chronic venous insufficiency and prior DVT, and aortic stenosis who presents with progressive exertional fatigue and shortness of breath as well as frequent episodes of dizziness. She had moderate AS by echo about one year ago with a mean gradient at that time of 34 mm Hg. Her most recent echo on 08/28/2017 showed an increase in the mean gradient to 46 mm Hg with a DI of 0.22. The LVEF was 60-65%   She has chronic weakness of her legs for over 20 years of undetermined etiology. She has to use her arms to help herself up from sitting and can't go up stairs or step up on curbs. Recently started using a 4 pronged cane.      Past Medical History:  Diagnosis Date  . Abdominal pain 08/25/2014  . Adiposity 07/28/2013  . Anemia   . Aortic stenosis    echo 11/11: EF 60%, mild LVH, mod/severe AS with mean 32 mmHg, peak 55 mmHg, mild AI, mild MR, (previous echo 2010 with mean gradient 29 mmHg)  . Aortic valve disorder 12/01/2008   Qualifier: Diagnosis of By: Percival Spanish, MD, Farrel Gordon   . Arthritis, degenerative 05/21/2002  . Atrial fibrillation ([place])   . Atrial tachycardia ([place])   . Benign essential HTN 04/17/2006  . Bilateral dry eyes   . Bone/cartilage disorder 04/26/2001  . Brain aneurysm 01/16/2015  . Buzzing in ear 04/12/2006  . CAD (coronary artery disease)   . Carotid stenosis    dopplers 11/11: 0-39% bilat  . CAROTID STENOSIS 12/01/2008   Qualifier: Diagnosis of By: Percival Spanish, MD, Farrel Gordon   . Chest pain, unspecified 06/16/2010  . Chronic venous insufficiency 05/21/2002  . DVT  11/27/2008   Qualifier: Diagnosis of By: Percival Spanish, MD, Farrel Gordon   . GERD (gastroesophageal reflux disease)   . H/O cataract extraction 03/15/2007  . History of artificial joint 03/17/2005  . History of chicken pox   . Hyperlipidemia   . Hyperlipidemia, mild 10/08/2014     . Leg pain 10/17/2004  . Macular degeneration    Dr.DAbanzo  . Neck pain on left side 04/01/2015  . Osteoarthrosis involving more than one site but not generalized 05/30/2010   Overview: cervical spine 05/30/10 Right shoulder 05/30/10   . Osteopenia 10/08/2014  . Other congenital anomaly of toes   . Pain in joint, pelvic region and thigh   . Peripheral edema 08/25/2014  . Postconcussive syndrome 04/01/2015  . Shortness of breath 06/16/2010        Past Surgical History:  Procedure Laterality Date  . cataract surgery    . CHOLECYSTECTOMY    . FOOT SURGERY    . hyroglossal duct cyst resection    . LEFT AND RIGHT HEART CATHETERIZATION WITH CORONARY ANGIOGRAM N/A 03/21/2013   Procedure: LEFT AND RIGHT HEART CATHETERIZATION WITH CORONARY ANGIOGRAM; Surgeon: Minus Breeding, MD; Location: [facility] CATH LAB; Service: Cardiovascular; Laterality: N/A;  . left hip replacement    . RADICAL HYSTERECTOMY    .  RIGHT/LEFT HEART CATH AND CORONARY ANGIOGRAPHY N/A 09/28/2017   Procedure: RIGHT/LEFT HEART CATH AND CORONARY ANGIOGRAPHY; Surgeon: Burnell Blanks, MD; Location: [facility] CV LAB; Service: Cardiovascular; Laterality: N/A;  . TONSILLECTOMY          Family History  Problem Relation Age of Onset  . Heart disease Mother    CHF  . Hypertension Mother 18  . Heart disease Father 35   MI  . Cancer Maternal Aunt    Breast  . Heart disease Maternal Grandmother   . Heart disease Maternal Grandfather   . Hernia Daughter   . Gallstones Daughter    Social History        Socioeconomic History  . Marital status: Divorced    Spouse name: Not on file  . Number of children: 2  . Years of education: Not on file  . Highest  education level: Not on file  Occupational History  . Occupation: Retired-Worked in Writer  Social Needs  . Financial resource strain: Not on file  . Food insecurity:    Worry: Not on file    Inability: Not on file  . Transportation needs:    Medical: Not on file    Non-medical: Not on file  Tobacco Use  . Smoking status: Never Smoker  . Smokeless tobacco: Never Used  Substance and Sexual Activity  . Alcohol use: No  . Drug use: No  . 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 Physical activity:    Days per week: Not on file    Minutes per session: Not on file  . Stress: Not on file  Relationships  . Social connections:    Talks on phone: Not on file    Gets together: Not on file    Attends religious service: Not on file    Active member of club or organization: Not on file    Attends meetings of clubs or organizations: Not on file    Relationship status: Not on file  . Intimate partner violence:    Fear of current or ex partner: Not on file    Emotionally abused: Not on file    Physically abused: Not on file    Forced sexual activity: Not on file  Other Topics Concern  . Not on file  Social History Narrative  . Not on file         Current Outpatient Medications  Medication Sig Dispense Refill  . acetaminophen (TYLENOL) 500 MG tablet Take 1 tablet by mouth every 8 (eight) hours as needed for mild pain or headache.     Marland Kitchen CALCIUM-MAGNESIUM-ZINC PO Take 2 tablets by mouth daily.    Marland Kitchen CARTIA XT 240 MG 24 hr capsule TAKE 1 CAPSULE BY MOUTH DAILY 90 capsule 0  . Cholecalciferol (VITAMIN D3) 1000 units CAPS Take 1,000 Units by mouth daily.    . famciclovir (FAMVIR) 500 MG tablet TAKE ONE TABLET BY MOUTH TWO TIMES A DAY FOR 7 DAYS 14 tablet 1  . furosemide (LASIX) 20 MG tablet TAKE ONE TABLET BY MOUTH DAILY AS NEEDED WEIGHT GAIN > 3LB IN 24 HOURS OR INCREASED EDEMA 45 tablet 0  . gabapentin  (NEURONTIN) 800 MG tablet Take 1 tablet (800 mg total) by mouth 2 (two) times daily. 180 tablet 1  . hyoscyamine (LEVSIN SL) 0.125 MG SL tablet Place 1 tablet (0.125 mg total) under the tongue every 4 (four) hours  as needed. 30 tablet 0  . metoprolol tartrate (LOPRESSOR) 25 MG tablet TAKE 1 TABLET BY MOUTH 2 TIMES DAILY 180 tablet 0  . Multiple Vitamin (MULTIVITAMIN) tablet Take 1 tablet by mouth daily.     . multivitamin-lutein (OCUVITE-LUTEIN) CAPS capsule Take 1 capsule by mouth daily.    . Omega 3 1200 MG CAPS Take 1,200 mg by mouth daily. Reported on 03/24/2015    . omeprazole (PRILOSEC) 40 MG capsule TAKE ONE CAPSULE BY MOUTH ONCE DAILY 90 capsule 5  . Polyethyl Glycol-Propyl Glycol (SYSTANE OP) Apply 1 drop to eye 2 (two) times daily as needed. For dry eyes    . Probiotic Product (PHILLIPS COLON HEALTH PO) Take 1 capsule by mouth daily.    . traMADol (ULTRAM) 50 MG tablet Take 1 tablet (50 mg total) by mouth every 12 (twelve) hours as needed for moderate pain. 40 tablet 0  . warfarin (COUMADIN) 5 MG tablet TAKE DAILY AS DIRECTED BY COUMADIN CLINIC (Patient taking differently: Take 2.5-5 mg by mouth See admin instructions. Take 103my by mouth Monday, Wednesday, Thursday, Friday and Sunday. On Tuesday and Saturday take 2.5mg ) 90 tablet 2   No current facility-administered medications for this visit.         Allergies  Allergen Reactions  . Indomethacin Nausea And Vomiting    Caused chest pain.  . Codeine Nausea And Vomiting   Review of Systems:  General: normal appetite, decreased energy, no weight gain, no weight loss, no fever  Cardiac: no chest pain with exertion, no chest pain at rest, +SOB with exertion, no resting SOB, no PND, no orthopnea, no palpitations, + arrhythmia, + atrial fibrillation, + LE edema, + dizzy spells, no syncope  Respiratory: + exertional shortness of breath, no home oxygen, no productive cough, no dry cough, no bronchitis, no wheezing, no hemoptysis, no asthma, no  pain with inspiration or cough, no sleep apnea, no CPAP at night  GI: no difficulty swallowing, no reflux, no frequent heartburn, no hiatal hernia, no abdominal pain, no constipation, no diarrhea, no hematochezia, no hematemesis, no melena  GU: no dysuria, no frequency, no urinary tract infection, no hematuria, no kidney stones, no kidney disease  Vascular: no pain suggestive of claudication, no pain in feet, no leg cramps, no varicose veins, remote DVT, no non-healing foot ulcer  Neuro: no stroke, no TIA's, no seizures, no headaches, no temporary blindness one eye, no slurred speech, no peripheral neuropathy, no chronic pain, + instability of gait, no memory/cognitive dysfunction, chronic weakness in both legs.  Musculoskeletal: no arthritis, no joint swelling, no myalgias, some difficulty walking, decreased mobility  Skin: no rash, no itching, no skin infections, no pressure sores or ulcerations  Psych: no anxiety, no depression, no nervousness, no unusual recent stress  Eyes: no blurry vision, no floaters, no recent vision changes, wears glasses or contacts  ENT: no hearing loss, no loose or painful teeth, full dentures,  Hematologic: no easy bruising, no abnormal bleeding, no clotting disorder, no frequent epistaxis  Endocrine: no diabetes, does not check CBG's at home  Physical Exam:  BP 130/65 (BP Location: Right Arm, Patient Position: Sitting, Cuff Size: Large)  Pulse (!) 54  Resp 16  Ht 5\' 4"  (1.626 m)  Wt 165 lb (74.8 kg)  SpO2 94% Comment: ON RA  BMI 28.32 kg/m  General: Elderly but well-appearing  HEENT: Unremarkable, NCAT, PERLA, EOMI, oropharynx clear.  Neck: no JVD, no bruits, no adenopathy or thyromegaly  Chest: clear to auscultation, symmetrical breath  sounds, no wheezes, no rhonchi  CV: RRR, grade III/VI crescendo/decrescendo murmur heard best at RSB, no diastolic murmur  Abdomen: soft, non-tender, no masses or organomegaly  Extremities: warm, well-perfused, pulses  diminished but palpable in feet, Mild LE edema bilaterally in ankles and feet  Rectal/GU Deferred  Neuro: Grossly non-focal and symmetrical throughout  Skin: Clean and dry, no rashes, no breakdown  Diagnostic Tests:  [facility]  [address], [place] [address]  [phone]  -------------------------------------------------------------------  Transthoracic Echocardiography  Patient: Chesney, Suares  MR #: [id number]  Study Date: 08/28/2017  Gender: F  Age: 15  Height: 162.6 cm  Weight: 75.8 kg  BSA: 1.87 m^2  Pt. Status:  Room:  ORDERING Minus Breeding, MD  REFERRING Minus Breeding, MD  REFERRING Penni Homans A  PERFORMING [facility], Outpatient  SONOGRAPHER [facility], RDCS  ATTENDING Harrell Gave, Bridgette  cc:  -------------------------------------------------------------------  LV EF: 60% - 65%  -------------------------------------------------------------------  Indications: Atrial Fibrillation (I48.0).  -------------------------------------------------------------------  History: PMH: Dyspnea. Atrial fibrillation. Aortic valve  disease. Risk factors: Family history of coronary artery disease.  Dyslipidemia.  -------------------------------------------------------------------  Study Conclusions  - Left ventricle: The cavity size was normal. There was mild focal  basal hypertrophy of the septum. Systolic function was normal.  The estimated ejection fraction was in the range of 60% to 65%.  Wall motion was normal; there were no regional wall motion  abnormalities. The study is indeterminate for the evaluation of  LV diastolic function.  - Aortic valve: Valve mobility was moderate to severely restricted.  In parasternal short axis view, only noncoronary cusp appears to  significantly open. There was severe stenosis. There was trivial  regurgitation. Peak velocity (S): 472 cm/s. Mean gradient (S): 46  mm Hg. Peak gradient (S): 89 mm Hg. Valve area  (VTI): 1.11 cm^2.  Valve area (Vmax): 0.92 cm^2. Valve area (Vmean): 1.03 cm^2.  - Mitral valve: There was no evidence for stenosis. There was  trivial regurgitation.  - Left atrium: The atrium was mildly dilated.  - Right ventricle: The cavity size was mildly dilated. Wall  thickness was normal.  - Tricuspid valve: There was mild regurgitation.  - Pulmonic valve: There was no significant regurgitation.  - Pulmonary arteries: PA peak pressure: 41 mm Hg (S).  Impressions:  - Severe aortic stenosis based on peak velocity >4 m/s and  gradients. Valve is calcified with restricted mobility.  Progression since last echo. Normal LV EF, no other significant  valvular disease.  -------------------------------------------------------------------  Study data: Comparison was made to the study of 08/30/2016. Study  status: Routine. Procedure: Transthoracic echocardiography.  Image quality was adequate. Study completion: There were no  complications. Transthoracic echocardiography. M-mode,  complete 2D, spectral Doppler, and color Doppler. Birthdate:  Patient birthdate: 07-Jan-1941. Age: Patient is 77 yr old. Sex:  Gender: female. BMI: 28.7 kg/m^2. Blood pressure: 120/68  Patient status: Outpatient. Study date: Study date: 08/28/2017.  Study time: 11:37 AM. Location: [facility]  -------------------------------------------------------------------  -------------------------------------------------------------------  Left ventricle: The cavity size was normal. There was mild focal  basal hypertrophy of the septum. Systolic function was normal. The  estimated ejection fraction was in the range of 60% to 65%. Wall  motion was normal; there were no regional wall motion  abnormalities. The study is indeterminate for the evaluation of LV  diastolic function.  -------------------------------------------------------------------  Aortic valve: Trileaflet; moderately thickened, moderately  calcified  leaflets. Valve mobility was moderate to severely  restricted. In parasternal short  axis view, only noncoronary cusp  appears to significantly open. Doppler: There was severe  stenosis. There was trivial regurgitation. VTI ratio of LVOT  to aortic valve: 0.22. Valve area (VTI): 1.11 cm^2. Indexed valve  area (VTI): 0.61 cm^2/m^2. Peak velocity ratio of LVOT to aortic  valve: 0.22. Valve area (Vmax): 0.92 cm^2. Indexed valve area  (Vmax): 0.49 cm^2/m^2. Mean velocity ratio of LVOT to aortic valve:  0.25. Valve area (Vmean): 1.03 cm^2. Indexed valve area (Vmean):  0.55 cm^2/m^2. Mean gradient (S): 46 mm Hg. Peak gradient (S):  89 mm Hg.  -------------------------------------------------------------------  Aorta: Aortic root: The aortic root was normal in size.  -------------------------------------------------------------------  Mitral valve: Mildly thickened leaflets . Mobility was not  restricted. Doppler: There was no evidence for stenosis. There  was trivial regurgitation. Valve area by pressure half-time:  1.93 cm^2. Indexed valve area by pressure half-time: 1.03 cm^2/m^2.  Peak gradient (D): 3 mm Hg.  -------------------------------------------------------------------  Left atrium: The atrium was mildly dilated.  -------------------------------------------------------------------  Right ventricle: The cavity size was mildly dilated. Wall  thickness was normal. Systolic function was normal.  -------------------------------------------------------------------  Pulmonic valve: The valve appears to be grossly normal.  Doppler: Transvalvular velocity was within the normal range. There  was no evidence for stenosis. There was no significant  regurgitation.  -------------------------------------------------------------------  Tricuspid valve: Structurally normal valve. Doppler:  Transvalvular velocity was within the normal range. There was mild  regurgitation.    -------------------------------------------------------------------  Pulmonary artery: The main pulmonary artery was normal-sized.  -------------------------------------------------------------------  Right atrium: The atrium was normal in size.  -------------------------------------------------------------------  Pericardium: There was no pericardial effusion.  -------------------------------------------------------------------  Systemic veins:  Inferior vena cava: The vessel was normal in size.  -------------------------------------------------------------------  Measurements  Left ventricle Value Reference  LV ID, ED, PLAX chordal (L) 36 mm 43 - 52  LV ID, ES, PLAX chordal (L) 21 mm 23 - 38  LV fx shortening, PLAX chordal 42 % >=29  LV PW thickness, ED 11 mm ----------  IVS/LV PW ratio, ED 0.91 <=1.3  Stroke volume, 2D 131 ml ----------  Stroke volume/bsa, 2D 70 ml/m^2 ----------  LV e&', lateral 7.94 cm/s ----------  LV E/e&', lateral 10.33 ----------  LV e&', medial 5.98 cm/s ----------  LV E/e&', medial 13.71 ----------  LV e&', average 6.96 cm/s ----------  LV E/e&', average 11.78 ----------  Ventricular septum Value Reference  IVS thickness, ED 10 mm ----------  LVOT Value Reference  LVOT ID, S 23 mm ----------  LVOT area 4.15 cm^2 ----------  LVOT peak velocity, S 105 cm/s ----------  LVOT mean velocity, S 77.1 cm/s ----------  LVOT VTI, S 31.6 cm ----------  Aortic valve Value Reference  Aortic valve peak velocity, S 472 cm/s ----------  Aortic valve mean velocity, S 312 cm/s ----------  Aortic valve VTI, S 118 cm ----------  Aortic mean gradient, S 46 mm Hg ----------  Aortic peak gradient, S 89 mm Hg ----------  VTI ratio, LVOT/AV 0.22 ----------  Aortic valve area, VTI 1.11 cm^2 ----------  Aortic valve area/bsa, VTI 0.61 cm^2/m^2 ----------  Velocity ratio, peak, LVOT/AV 0.22 ----------  Aortic valve area, peak velocity 0.92 cm^2 ----------  Aortic valve  area/bsa, peak 0.49 cm^2/m^2 ----------  velocity  Velocity ratio, mean, LVOT/AV 0.25 ----------  Aortic valve area, mean velocity 1.03 cm^2 ----------  Aortic valve area/bsa, mean 0.55 cm^2/m^2 ----------  velocity  Aorta Value Reference  Aortic root ID, ED 32 mm ----------  Ascending aorta ID, A-P, S 38 mm ----------  Left atrium Value Reference  LA ID, A-P, ES 38 mm ----------  LA ID/bsa, A-P 2.03 cm/m^2 <=2.2  LA volume, S 52.2 ml ----------  LA volume/bsa, S 27.9 ml/m^2 ----------  LA volume, ES, 1-p A4C 56.6 ml ----------  LA volume/bsa, ES, 1-p A4C 30.2 ml/m^2 ----------  LA volume, ES, 1-p A2C 48.1 ml ----------  LA volume/bsa, ES, 1-p A2C 25.7 ml/m^2 ----------  Mitral valve Value Reference  Mitral E-wave peak velocity 82 cm/s ----------  Mitral A-wave peak velocity 95.6 cm/s ----------  Mitral deceleration time (H) 391 ms 150 - 230  Mitral pressure half-time 114 ms ----------  Mitral peak gradient, D 3 mm Hg ----------  Mitral E/A ratio, peak 0.9 ----------  Mitral valve area, PHT, DP 1.93 cm^2 ----------  Mitral valve area/bsa, PHT, DP 1.03 cm^2/m^2 ----------  Pulmonary arteries Value Reference  PA pressure, S, DP (H) 41 mm Hg <=30  Tricuspid valve Value Reference  Tricuspid regurg peak velocity 309 cm/s ----------  Tricuspid peak RV-RA gradient 38 mm Hg ----------  Right atrium Value Reference  RA ID, S-I, ES, A4C 45 mm 34 - 49  RA area, ES, A4C 13.8 cm^2 8.3 - 19.5  RA volume, ES, A/L 34.3 ml ----------  RA volume/bsa, ES, A/L 18.3 ml/m^2 ----------  Systemic veins Value Reference  Estimated CVP 3 mm Hg ----------  Right ventricle Value Reference  TAPSE 23.3 mm ----------  RV pressure, S, DP (H) 41 mm Hg <=30  RV s&', lateral, S 14 cm/s ----------  Legend:  (L) and (H) mark values outside specified reference range.  -------------------------------------------------------------------  Prepared and Electronically Authenticated by  Buford Dresser    2019-07-23T14:52:33  Physicians  Panel Physicians Referring Physician Case Authorizing Physician  Burnell Blanks, MD (Primary)    Procedures  RIGHT/LEFT HEART CATH AND CORONARY ANGIOGRAPHY  Conclusion  Ost 2nd Mrg lesion is 20% stenosed.  Ost LAD to Prox LAD lesion is 20% stenosed.  Prox LAD lesion is 30% stenosed.  Ost LM to Mid LM lesion is 20% stenosed.  Ost 1st Diag lesion is 70% stenosed. 1. Mild non-obstructive CAD. The small caliber diagonal branch has a moderate stenosis, too small for PCI.  2. Severe aortic stenosis by echo findings. By cath mean gradient 26 mmHg, peak to peak gradient 21 mmHg, AVA 1.38 cm2.  Recommendations: Will continue workup for TAVR. Resume coumadin tonight.   Indications  Severe aortic stenosis [I35.0 (ICD-10-CM)]  Procedural Details/Technique  Technical Details Indication: 77 yo female with CAD, severe aortic stenosis, workup for TAVR  Procedure: The risks, benefits, complications, treatment options, and expected outcomes were discussed with the patient. The patient and/or family concurred with the proposed plan, giving informed consent. The patient was brought to the cath lab after IV hydration was given. The patient was sedated with Versed and Fentanyl. The IV catheter in the right wrist was changed for a 5 French sheath. Right heart cath performed with a balloon tipped catheter. The right wrist was prepped and draped in a sterile fashion. 1% lidocaine was used for local anesthesia. Using the modified Seldinger access technique, a 5 French sheath was placed in the right radial artery. 3 mg Verapamil was given through the sheath. 4000 units IV heparin was given. Standard diagnostic catheters were used to perform selective coronary angiography. I crossed the aortic valve with an AL-1 and a straight wire. LV pressures measured. The sheath was removed from the right radial artery and a Terumo hemostasis band was applied at the  arteriotomy site on the  right wrist.     Estimated blood loss <50 mL.  During this procedure the patient was administered the following to achieve and maintain moderate conscious sedation: Versed 1 mg, Fentanyl 25 mcg, while the patient's heart rate, blood pressure, and oxygen saturation were continuously monitored. The period of conscious sedation was 29 minutes, of which I was present face-to-face 100% of this time.  Complications  Complications documented before study signed (09/28/2017 8:33 AM EDT)   RIGHT/LEFT HEART CATH AND CORONARY ANGIOGRAPHY   None Documented by Burnell Blanks, MD 09/28/2017 8:29 AM EDT  Time Range: Intraprocedure    Coronary Findings  Diagnostic  Dominance: Left  Left Main  Ost LM to Mid LM lesion 20% stenosed  Ost LM to Mid LM lesion is 20% stenosed.  Left Anterior Descending  Vessel is large.  Ost LAD to Prox LAD lesion 20% stenosed  Ost LAD to Prox LAD lesion is 20% stenosed.  Prox LAD lesion 30% stenosed  Prox LAD lesion is 30% stenosed. The lesion is calcified.  First Diagonal Branch  Vessel is small in size.  Ost 1st Diag lesion 70% stenosed  Ost 1st Diag lesion is 70% stenosed.  Second Diagonal Branch  Vessel is moderate in size.  Left Circumflex  Vessel is large.  First Obtuse Marginal Branch  Vessel is large in size.  Second Obtuse Marginal Branch  Vessel is moderate in size.  Ost 2nd Mrg lesion 20% stenosed  Ost 2nd Mrg lesion is 20% stenosed.  Right Coronary Artery  Vessel is moderate in size.  Intervention  No interventions have been documented.  Coronary Diagrams  Diagnostic Diagram     Implants     No implant documentation for this case.  MERGE Images  Link to Procedure Log   Show images for CARDIAC CATHETERIZATION Procedure Log  Hemo Data   Most Recent Value  Fick Cardiac Output 6.21 L/min  Fick Cardiac Output Index 3.4 (L/min)/BSA  Aortic Mean Gradient 26 mmHg  Aortic Peak Gradient 21 mmHg  Aortic Valve Area 1.36  Aortic Value Area  Index 0.75 cm2/BSA  RA A Wave 4 mmHg  RA V Wave 2 mmHg  RA Mean 1 mmHg  RV Systolic Pressure 31 mmHg  RV Diastolic Pressure 2 mmHg  RV EDP 6 mmHg  PA Systolic Pressure 27 mmHg  PA Diastolic Pressure 9 mmHg  PA Mean 18 mmHg  PW A Wave 15 mmHg  PW V Wave 14 mmHg  PW Mean 11 mmHg  AO Systolic Pressure 253 mmHg  AO Diastolic Pressure 59 mmHg  AO Mean 89 mmHg  LV Systolic Pressure 664 mmHg  LV Diastolic Pressure 7 mmHg  LV EDP 18 mmHg  AOp Systolic Pressure 403 mmHg  AOp Diastolic Pressure 64 mmHg  AOp Mean Pressure 99 mmHg  LVp Systolic Pressure 474 mmHg  LVp Diastolic Pressure 11 mmHg  LVp EDP Pressure 18 mmHg  QP/QS 1  TPVR Index 5.29 HRUI  TSVR Index 26.15 HRUI  PVR SVR Ratio 0.08  TPVR/TSVR Ratio 0.2   ADDENDUM REPORT: 10/22/2017 11:15  CLINICAL DATA: Aortic stenosis  EXAM:  Cardiac TAVR CT  TECHNIQUE:  The patient was scanned on a [employer] scanner. A 120 kV  retrospective scan was triggered in the descending thoracic aorta at  111 HU's. Gantry rotation speed was 270 msecs and collimation was .9  mm. No beta blockade or nitro were given. The 3D data set was  reconstructed in 5% intervals of  the R-R cycle. Systolic and  diastolic phases were analyzed on a dedicated work station using  MPR, MIP and VRT modes. The patient received 80 cc of contrast.  FINDINGS:  Aortic Valve: Calcified tri leaflet with restricted motion  Aorta: Moderate calcific atherosclerosis  Sinotubular Junction: 30 mm  Ascending Thoracic Aorta: 34 mm  Aortic Arch: Not well seen  Descending Thoracic Aorta: 24 mm  Sinus of Valsalva Measurements:  Non-coronary: 30.8 mm  Right - coronary: 29.1 mm  Left - coronary: 31.8 mm  Coronary Artery Height above Annulus:  Left Main: 10.8 mm above annulus  Right Coronary: 12.7 mm above annulus  Virtual Basal Annulus Measurements:  Maximum/Minimum Diameter: 21.7 mm x 28.3 mm  Perimeter: 79.8 mm  Area: 501.5 mm2  Coronary Arteries: Sufficient height  above annulus for deployment  Optimum Fluoroscopic Angle for Delivery: LAO 14 Caudal 14 degrees  IMPRESSION:  1. Calcified tri leaflet AV with annular area of 501.5 mm2 suitable  for a 26 mm Sapien 3 valve  2. Optimum angiographic angle for deployment LAO 14 Caudal 14  degrees  3. Coronary arteries sufficient height above annulus for deployment  4. Normal aortic root diameter 3.4 cm  5. No LAA thrombus.  Jenkins Rouge  Electronically Signed  By: Jenkins Rouge M.D.  On: 10/22/2017 11:15     CLINICAL DATA: 77 year old female with history of severe aortic  stenosis. Preprocedural study prior to potential transcatheter  aortic valve replacement (TAVR) procedure.  EXAM:  CT ANGIOGRAPHY CHEST, ABDOMEN AND PELVIS  TECHNIQUE:  Multidetector CT imaging through the chest, abdomen and pelvis was  performed using the standard protocol during bolus administration of  intravenous contrast. Multiplanar reconstructed images and MIPs were  obtained and reviewed to evaluate the vascular anatomy.  CONTRAST: 147mL ISOVUE-370 IOPAMIDOL (ISOVUE-370) INJECTION 76%  COMPARISON: None.  FINDINGS:  CTA CHEST FINDINGS  Cardiovascular: Heart size is mildly enlarged. There is no  significant pericardial fluid, thickening or pericardial  calcification. There is aortic atherosclerosis, as well as  atherosclerosis of the great vessels of the mediastinum and the  coronary arteries, including calcified atherosclerotic plaque in the  left main and left anterior descending coronary arteries. Severe  thickening calcifications of the aortic valve.  Mediastinum/Lymph Nodes: No pathologically enlarged mediastinal or  hilar lymph nodes. Esophagus is unremarkable in appearance. No  axillary lymphadenopathy.  Lungs/Pleura: No suspicious pulmonary nodules or masses. No acute  consolidative airspace disease. No pleural effusions.  Musculoskeletal/Soft Tissues: There are no aggressive appearing  lytic or blastic lesions  noted in the visualized portions of the  skeleton.  CTA ABDOMEN AND PELVIS FINDINGS  Hepatobiliary: 9 mm low-attenuation lesion in segment 4A of the  liver, too small to characterize, but statistically likely a cyst.  2.1 cm low-attenuation lesion in the periphery of segment 8 of the  liver, compatible with a simple cyst. Status post cholecystectomy.  Moderate to severe intrahepatic biliary ductal dilatation. Common  hepatic duct measures up to 24 mm in diameter. Common bile duct is  also dilated measuring 9 mm in the porta hepatis. On coronal image  69 of series 18 and axial image 111 of series 16 there is evidence  of potential noncalcified stone in the distal common bile duct,  however, this is not definitive.  Pancreas: No pancreatic mass. No pancreatic ductal dilatation. No  pancreatic or peripancreatic fluid or inflammatory changes.  Spleen: Unremarkable.  Adrenals/Urinary Tract: 2.8 cm low-attenuation lesion in the medial  aspect of the interpolar region of  the right kidney, compatible with  a simple cyst. Left kidney and bilateral adrenal glands are normal  in appearance. No hydroureteronephrosis. Urinary bladder is normal  in appearance.  Stomach/Bowel: Normal appearance of the stomach. No pathologic  dilatation of small bowel or colon. A few scattered colonic  diverticulae are noted, without surrounding inflammatory changes to  suggest an acute diverticulitis at this time. Normal appendix.  Vascular/Lymphatic: Aortic atherosclerosis, with vascular findings  and measurements pertinent to potential TAVR procedure, as detailed  below. No aneurysm or dissection noted in the abdominal or pelvic  vasculature. No lymphadenopathy noted in the abdomen or pelvis.  Reproductive: Status post hysterectomy. Ovaries are not confidently  identified may be surgically absent or atrophic.  Other: No significant volume of ascites. No pneumoperitoneum.  Musculoskeletal: Status post left hip  arthroplasty. There are no  aggressive appearing lytic or blastic lesions noted in the  visualized portions of the skeleton.  VASCULAR MEASUREMENTS PERTINENT TO TAVR:  AORTA:  Minimal Aortic Diameter-15 x 13 mm  Severity of Aortic Calcification-moderate to severe  RIGHT PELVIS:  Right Common Iliac Artery -  Minimal Diameter-8.5 x 7.5 mm  Tortuosity-moderate  Calcification-moderate  Right External Iliac Artery -  Minimal Diameter-6.7 x 6.8 mm  Tortuosity-moderate  Calcification-none  Right Common Femoral Artery -  Minimal Diameter-6.6 x 7.3 mm  Tortuosity-mild  Calcification-mild  LEFT PELVIS:  Left Common Iliac Artery -  Minimal Diameter-8.2 x 7.2 mm  Tortuosity-severe  Calcification-mild-to-moderate  Left External Iliac Artery -  Minimal Diameter-6.1 x 6.5 mm  Tortuosity-moderate  Calcification-none  Left Common Femoral Artery -  Minimal Diameter-6.3 x 7.9 mm  Tortuosity-mild  Calcification-mild  Review of the MIP images confirms the above findings.  IMPRESSION:  1. Vascular findings and measurements pertinent to potential TAVR  procedure, as detailed above.  2. Severe thickening calcification of the aortic valve, compatible  with the reported clinical history of severe aortic stenosis.  3. Aortic atherosclerosis, in addition to left main and left  anterior descending coronary artery disease. Assessment for  potential risk factor modification, dietary therapy or pharmacologic  therapy may be warranted, if clinically indicated.  4. Status post cholecystectomy with extensive biliary tract  dilatation and possible noncalcified choledocholithiasis in the  distal common bile duct. If there is any clinical concern or  biochemical evidence of biliary tract obstruction, further  evaluation with nonemergent MRI of the abdomen with and without IV  gadolinium with MRCP should be considered in the near future.  5. Colonic diverticulosis without evidence of acute diverticulitis    at this time.  6. Additional incidental findings, as above.  Electronically Signed  By: Vinnie Langton M.D.  On: 10/22/2017 11:48  STS Risk Score:  Procedure: Isolated AVR   Risk of Mortality: 1.926%  Renal Failure: 0.956%  Permanent Stroke: 1.007%  Prolonged Ventilation: 4.926%  DSW Infection: 0.082%  Reoperation: 2.868%  Morbidity or Mortality: 8.423%  Short Length of Stay: 33.589%  Long Length of Stay: 4.674%   Impression:   This 77 year old woman has stage D, severe, symptomatic aortic stenosis with New York Heart Association class II-III symptoms of exertional fatigue and shortness of breath consistent with chronic diastolic congestive heart failure. She is also had frequent episodes of dizziness with ambulation and some lower extremity edema. I have personally reviewed her echocardiogram, cardiac cath, and CTA studies. Her echocardiogram shows a trileaflet aortic valve with moderate thickening and calcification and severely reduced mobility. The mean gradient has increased significantly over the past year  to 46 mmHg with a dimensionless index of 0.22 consistent with severe aortic stenosis. Left ventricular ejection fraction is 60 to 65%. Cardiac catheterization shows mild nonobstructive coronary disease. The mean gradient across aortic valve the catheterization was 26 mmHg and the peak to peak gradient was 21 mmHg. I think aortic valve replacement is indicated in this patient with progressive symptoms that are limiting her ability to be active. She would be a low risk candidate for open surgical aortic valve replacement based on the STS PROM score but think she would have a prolonged recovery due to her age and bilateral chronic leg weakness and inability to use her legs to get herself up out of a chair. She is very dependent on her upper body for mobilization which would be a significant problem after a sternotomy. I think transcatheter aortic valve replacement would be the best option  for this patient. Her gated cardiac CTA shows anatomy suitable for transcatheter aortic valve replacement with no complicating features. Her abdominal and pelvic CTA shows adequate pelvic vascular anatomy for transfemoral insertion.  The patient and her family were counseled at length regarding treatment alternatives for management of severe symptomatic aortic stenosis. The risks and benefits of surgical intervention has been discussed in detail. Long-term prognosis with medical therapy was discussed. Alternative approaches such as conventional surgical aortic valve replacement, transcatheter aortic valve replacement, and palliative medical therapy were compared and contrasted at length. This discussion was placed in the context of the patient's own specific clinical presentation and past medical history. All of their questions have been addressed.   Following the decision to proceed with transcatheter aortic valve replacement, a discussion was held regarding what types of management strategies would be attempted intraoperatively in the event of life-threatening complications, including whether or not the patient would be considered a candidate for the use of cardiopulmonary bypass and/or conversion to open sternotomy for attempted surgical intervention.   The patient has been advised of a variety of complications that might develop including but not limited to risks of death, stroke, paravalvular leak, aortic dissection or other major vascular complications, aortic annulus rupture, device embolization, cardiac rupture or perforation, mitral regurgitation, acute myocardial infarction, arrhythmia, heart block or bradycardia requiring permanent pacemaker placement, congestive heart failure, respiratory failure, renal failure, pneumonia, infection, other late complications related to structural valve deterioration or migration, or other complications that might ultimately cause a temporary or permanent loss of  functional independence or other long term morbidity. The patient provides full informed consent for the procedure as described and all questions were answered.   Plan:   Transcatheter aortic valve replacement using a Sapien 3 valve on 11/13/2017.   Gaye Pollack, MD

## 2017-11-12 NOTE — Anesthesia Preprocedure Evaluation (Addendum)
Anesthesia Evaluation  Patient identified by MRN, date of birth, ID band Patient awake    Reviewed: Allergy & Precautions, NPO status , Patient's Chart, lab work & pertinent test results  History of Anesthesia Complications (+) PONV  Airway Mallampati: II  TM Distance: >3 FB Neck ROM: Full    Dental  (+) Edentulous Upper, Edentulous Lower   Pulmonary neg pulmonary ROS,    breath sounds clear to auscultation       Cardiovascular hypertension, Pt. on medications and Pt. on home beta blockers (-) angina+ CAD (mild, non-obstructive) and + DVT (2010)  + dysrhythmias Atrial Fibrillation + Valvular Problems/Murmurs AS  Rhythm:Regular Rate:Normal + Systolic murmurs 4/12 cath: mild non-obstructive disease, severe AS with mean grad 26 mmHg 7/19 ECHO: EF 60-65%, severe AS with peak grad 89 mmHg, mean gradient 46 mmHg, peak velocity 472 cm/s, trivial MR, mild TR   Neuro/Psych Dementia negative neurological ROS     GI/Hepatic Neg liver ROS, GERD  Medicated and Controlled,  Endo/Other  negative endocrine ROS  Renal/GU negative Renal ROS     Musculoskeletal  (+) Arthritis ,   Abdominal   Peds  Hematology coumadin   Anesthesia Other Findings   Reproductive/Obstetrics                            Anesthesia Physical Anesthesia Plan  ASA: III  Anesthesia Plan: MAC   Post-op Pain Management:    Induction:   PONV Risk Score and Plan: 3 and Ondansetron and Treatment may vary due to age or medical condition  Airway Management Planned: Natural Airway and Nasal Cannula  Additional Equipment: Arterial line and CVP  Intra-op Plan:   Post-operative Plan:   Informed Consent: I have reviewed the patients History and Physical, chart, labs and discussed the procedure including the risks, benefits and alternatives for the proposed anesthesia with the patient or authorized representative who has indicated  his/her understanding and acceptance.   Dental advisory given  Plan Discussed with: CRNA and Surgeon  Anesthesia Plan Comments: (Plan routine monitors, A-line, CVP, MAC)        Anesthesia Quick Evaluation

## 2017-11-12 NOTE — Telephone Encounter (Signed)
  [facility]   MULTIDISCIPLINARY HEART VALVE TEAM   Pre surgery labs show large amounts of leukocytes in urine. Cipro 500mg  BID has been called into pharmacy in anticipation for TAVR tomorrow.  Angelena Form PA-C  [facility]

## 2017-11-13 ENCOUNTER — Encounter (HOSPITAL_COMMUNITY): Payer: Self-pay

## 2017-11-13 ENCOUNTER — Inpatient Hospital Stay (HOSPITAL_COMMUNITY): Payer: Medicare Other | Admitting: Physician Assistant

## 2017-11-13 ENCOUNTER — Ambulatory Visit (HOSPITAL_COMMUNITY)
Admission: RE | Admit: 2017-11-13 | Discharge: 2017-11-13 | Disposition: A | Discharge status: Home / Self Care | Payer: Medicare Other | Source: Ambulatory Visit | Attending: Cardiovascular Disease | Admitting: Cardiovascular Disease

## 2017-11-13 ENCOUNTER — Other Ambulatory Visit: Payer: Self-pay

## 2017-11-13 ENCOUNTER — Inpatient Hospital Stay (HOSPITAL_COMMUNITY)
Admission: RE | Admit: 2017-11-13 | Discharge: 2017-11-15 | DRG: 266 | Disposition: A | Discharge status: Home / Self Care | Payer: Medicare Other | Attending: Cardiovascular Disease | Admitting: Cardiovascular Disease

## 2017-11-13 ENCOUNTER — Inpatient Hospital Stay (HOSPITAL_COMMUNITY): Payer: Medicare Other

## 2017-11-13 ENCOUNTER — Encounter (HOSPITAL_COMMUNITY)
Admission: RE | Disposition: A | Discharge status: Home / Self Care | Payer: Self-pay | Source: Home / Self Care | Attending: Cardiovascular Disease

## 2017-11-13 ENCOUNTER — Other Ambulatory Visit: Payer: Self-pay | Admitting: Physician Assistant

## 2017-11-13 ENCOUNTER — Inpatient Hospital Stay (HOSPITAL_COMMUNITY): Payer: Medicare Other | Admitting: Anesthesiology

## 2017-11-13 DIAGNOSIS — E785 Hyperlipidemia, unspecified: Secondary | ICD-10-CM | POA: Diagnosis not present

## 2017-11-13 DIAGNOSIS — Z7901 Long term (current) use of anticoagulants: Secondary | ICD-10-CM | POA: Diagnosis not present

## 2017-11-13 DIAGNOSIS — G8314 Monoplegia of lower limb affecting left nondominant side: Secondary | ICD-10-CM | POA: Diagnosis present

## 2017-11-13 DIAGNOSIS — I110 Hypertensive heart disease with heart failure: Secondary | ICD-10-CM | POA: Diagnosis not present

## 2017-11-13 DIAGNOSIS — M8580 Other specified disorders of bone density and structure, unspecified site: Secondary | ICD-10-CM | POA: Diagnosis not present

## 2017-11-13 DIAGNOSIS — Z952 Presence of prosthetic heart valve: Secondary | ICD-10-CM | POA: Diagnosis not present

## 2017-11-13 DIAGNOSIS — I340 Nonrheumatic mitral (valve) insufficiency: Secondary | ICD-10-CM | POA: Diagnosis not present

## 2017-11-13 DIAGNOSIS — H3530 Unspecified macular degeneration: Secondary | ICD-10-CM | POA: Diagnosis present

## 2017-11-13 DIAGNOSIS — I350 Nonrheumatic aortic (valve) stenosis: Secondary | ICD-10-CM | POA: Diagnosis not present

## 2017-11-13 DIAGNOSIS — Z86718 Personal history of other venous thrombosis and embolism: Secondary | ICD-10-CM | POA: Diagnosis not present

## 2017-11-13 DIAGNOSIS — I10 Essential (primary) hypertension: Secondary | ICD-10-CM | POA: Diagnosis present

## 2017-11-13 DIAGNOSIS — M1990 Unspecified osteoarthritis, unspecified site: Secondary | ICD-10-CM | POA: Diagnosis present

## 2017-11-13 DIAGNOSIS — G8311 Monoplegia of lower limb affecting right dominant side: Secondary | ICD-10-CM | POA: Diagnosis present

## 2017-11-13 DIAGNOSIS — Z96642 Presence of left artificial hip joint: Secondary | ICD-10-CM | POA: Diagnosis present

## 2017-11-13 DIAGNOSIS — I872 Venous insufficiency (chronic) (peripheral): Secondary | ICD-10-CM | POA: Diagnosis present

## 2017-11-13 DIAGNOSIS — Z9049 Acquired absence of other specified parts of digestive tract: Secondary | ICD-10-CM

## 2017-11-13 DIAGNOSIS — I5033 Acute on chronic diastolic (congestive) heart failure: Secondary | ICD-10-CM | POA: Diagnosis present

## 2017-11-13 DIAGNOSIS — I2510 Atherosclerotic heart disease of native coronary artery without angina pectoris: Secondary | ICD-10-CM | POA: Diagnosis not present

## 2017-11-13 DIAGNOSIS — Z8249 Family history of ischemic heart disease and other diseases of the circulatory system: Secondary | ICD-10-CM | POA: Diagnosis not present

## 2017-11-13 DIAGNOSIS — Z90710 Acquired absence of both cervix and uterus: Secondary | ICD-10-CM

## 2017-11-13 DIAGNOSIS — Z885 Allergy status to narcotic agent status: Secondary | ICD-10-CM | POA: Diagnosis not present

## 2017-11-13 DIAGNOSIS — I4891 Unspecified atrial fibrillation: Secondary | ICD-10-CM | POA: Diagnosis not present

## 2017-11-13 DIAGNOSIS — Z79899 Other long term (current) drug therapy: Secondary | ICD-10-CM

## 2017-11-13 DIAGNOSIS — Z886 Allergy status to analgesic agent status: Secondary | ICD-10-CM

## 2017-11-13 DIAGNOSIS — I480 Paroxysmal atrial fibrillation: Secondary | ICD-10-CM | POA: Diagnosis not present

## 2017-11-13 DIAGNOSIS — Z006 Encounter for examination for normal comparison and control in clinical research program: Secondary | ICD-10-CM

## 2017-11-13 DIAGNOSIS — R5383 Other fatigue: Secondary | ICD-10-CM | POA: Diagnosis not present

## 2017-11-13 DIAGNOSIS — Z954 Presence of other heart-valve replacement: Secondary | ICD-10-CM | POA: Diagnosis not present

## 2017-11-13 DIAGNOSIS — K219 Gastro-esophageal reflux disease without esophagitis: Secondary | ICD-10-CM | POA: Diagnosis present

## 2017-11-13 DIAGNOSIS — I361 Nonrheumatic tricuspid (valve) insufficiency: Secondary | ICD-10-CM | POA: Diagnosis not present

## 2017-11-13 DIAGNOSIS — I509 Heart failure, unspecified: Secondary | ICD-10-CM

## 2017-11-13 HISTORY — DX: Cardiac murmur, unspecified: R01.1

## 2017-11-13 HISTORY — DX: Presence of prosthetic heart valve: Z95.2

## 2017-11-13 HISTORY — PX: INTRAOPERATIVE TRANSTHORACIC ECHOCARDIOGRAM: SHX6523

## 2017-11-13 HISTORY — DX: Other intervertebral disc degeneration, lumbosacral region: M51.37

## 2017-11-13 HISTORY — DX: Unspecified osteoarthritis, unspecified site: M19.90

## 2017-11-13 HISTORY — DX: Paroxysmal atrial fibrillation: I48.0

## 2017-11-13 HISTORY — DX: Other intervertebral disc degeneration, lumbosacral region without mention of lumbar back pain or lower extremity pain: M51.379

## 2017-11-13 HISTORY — PX: TRANSCATHETER AORTIC VALVE REPLACEMENT, TRANSFEMORAL: SHX6400

## 2017-11-13 LAB — POCT I-STAT, CHEM 8
BUN: 13 mg/dL (ref 8–23)
BUN: 12 mg/dL (ref 8–23)
BUN: 12 mg/dL (ref 8–23)
CHLORIDE: 106 mmol/L (ref 98–111)
CREATININE: 0.6 mg/dL (ref 0.44–1.00)
Calcium, Ion: 1.19 mmol/L (ref 1.15–1.40)
Calcium, Ion: 1.2 mmol/L (ref 1.15–1.40)
Calcium, Ion: 1.17 mmol/L (ref 1.15–1.40)
Chloride: 108 mmol/L (ref 98–111)
Chloride: 105 mmol/L (ref 98–111)
Creatinine, Ser: 0.5 mg/dL (ref 0.44–1.00)
Creatinine, Ser: 0.5 mg/dL (ref 0.44–1.00)
GLUCOSE: 144 mg/dL — AB (ref 70–99)
Glucose, Bld: 114 mg/dL — ABNORMAL HIGH (ref 70–99)
Glucose, Bld: 144 mg/dL — ABNORMAL HIGH (ref 70–99)
HCT: 37 % (ref 36.0–46.0)
HCT: 31 % — ABNORMAL LOW (ref 36.0–46.0)
HCT: 32 % — ABNORMAL LOW (ref 36.0–46.0)
HEMOGLOBIN: 12.6 g/dL (ref 12.0–15.0)
HEMOGLOBIN: 10.5 g/dL — AB (ref 12.0–15.0)
Hemoglobin: 10.9 g/dL — ABNORMAL LOW (ref 12.0–15.0)
POTASSIUM: 4.1 mmol/L (ref 3.5–5.1)
Potassium: 3.8 mmol/L (ref 3.5–5.1)
Potassium: 4.2 mmol/L (ref 3.5–5.1)
Sodium: 143 mmol/L (ref 135–145)
Sodium: 142 mmol/L (ref 135–145)
Sodium: 142 mmol/L (ref 135–145)
TCO2: 24 mmol/L (ref 22–32)
TCO2: 24 mmol/L (ref 22–32)
TCO2: 26 mmol/L (ref 22–32)

## 2017-11-13 LAB — PROTIME-INR
INR: 1.16
Prothrombin Time: 14.7 seconds (ref 11.4–15.2)

## 2017-11-13 SURGERY — IMPLANTATION, AORTIC VALVE, TRANSCATHETER, FEMORAL APPROACH
Anesthesia: Monitor Anesthesia Care | Site: Chest

## 2017-11-13 MED ORDER — LIDOCAINE HCL (PF) 1 % IJ SOLN
INTRAMUSCULAR | Status: AC
  Filled 2017-11-13: qty 10

## 2017-11-13 MED ORDER — OXYCODONE HCL 5 MG PO TABS: 5.0000 mg | ORAL_TABLET | ORAL | Status: DC | PRN

## 2017-11-13 MED ORDER — SODIUM CHLORIDE 0.9 % IV SOLN
INTRAVENOUS | Status: AC
  Filled 2017-11-13: qty 1.2

## 2017-11-13 MED ORDER — PROMETHAZINE HCL 25 MG/ML IJ SOLN
6.2500 mg | INTRAMUSCULAR | Status: DC | PRN
  Administered 2017-11-13: 6.25 mg via INTRAVENOUS
  Filled 2017-11-13: qty 1

## 2017-11-13 MED ORDER — PROTAMINE SULFATE 10 MG/ML IV SOLN
INTRAVENOUS | Status: DC | PRN
  Administered 2017-11-13 (×4): 30 mg via INTRAVENOUS

## 2017-11-13 MED ORDER — MORPHINE SULFATE (PF) 2 MG/ML IV SOLN
2.0000 mg | INTRAVENOUS | Status: DC | PRN
  Filled 2017-11-13: qty 1

## 2017-11-13 MED ORDER — CHLORHEXIDINE GLUCONATE CLOTH 2 % EX PADS
6.0000 | MEDICATED_PAD | Freq: Every day | CUTANEOUS | Status: DC
  Administered 2017-11-14: 6 via TOPICAL

## 2017-11-13 MED ORDER — HYOSCYAMINE SULFATE 0.125 MG SL SUBL
0.1250 mg | SUBLINGUAL_TABLET | SUBLINGUAL | Status: DC | PRN
  Filled 2017-11-13 (×2): qty 1

## 2017-11-13 MED ORDER — GABAPENTIN 800 MG PO TABS
800.0000 mg | ORAL_TABLET | Freq: Two times a day (BID) | ORAL | Status: DC
  Filled 2017-11-13 (×2): qty 1

## 2017-11-13 MED ORDER — ACETAMINOPHEN 325 MG PO TABS: 650.0000 mg | ORAL_TABLET | Freq: Four times a day (QID) | ORAL | Status: DC | PRN

## 2017-11-13 MED ORDER — VANCOMYCIN HCL IN DEXTROSE 1-5 GM/200ML-% IV SOLN
1000.0000 mg | Freq: Once | INTRAVENOUS | Status: AC
  Administered 2017-11-13: 1000 mg via INTRAVENOUS
  Filled 2017-11-13: qty 200

## 2017-11-13 MED ORDER — LACTATED RINGERS IV SOLN
INTRAVENOUS | Status: DC | PRN
  Administered 2017-11-13 (×3): via INTRAVENOUS

## 2017-11-13 MED ORDER — SODIUM CHLORIDE 0.9 % IV SOLN
1.5000 g | Freq: Two times a day (BID) | INTRAVENOUS | Status: AC
  Administered 2017-11-13 – 2017-11-15 (×4): 1.5 g via INTRAVENOUS
  Filled 2017-11-13 (×6): qty 1.5

## 2017-11-13 MED ORDER — SODIUM CHLORIDE 0.9 % IV SOLN: 250.0000 mL | INTRAVENOUS | Status: DC | PRN

## 2017-11-13 MED ORDER — SODIUM CHLORIDE 0.9 % IV SOLN
INTRAVENOUS | Status: DC | PRN
  Administered 2017-11-13: 1500 mL

## 2017-11-13 MED ORDER — CHLORHEXIDINE GLUCONATE 4 % EX LIQD: 60.0000 mL | Freq: Once | CUTANEOUS | Status: DC

## 2017-11-13 MED ORDER — MIDAZOLAM HCL 2 MG/2ML IJ SOLN
INTRAMUSCULAR | Status: AC
  Filled 2017-11-13: qty 2

## 2017-11-13 MED ORDER — HEPARIN SODIUM (PORCINE) 1000 UNIT/ML IJ SOLN
INTRAMUSCULAR | Status: DC | PRN
  Administered 2017-11-13: 12000 [IU] via INTRAVENOUS

## 2017-11-13 MED ORDER — CHLORHEXIDINE GLUCONATE 0.12 % MT SOLN
15.0000 mL | Freq: Once | OROMUCOSAL | Status: AC
  Administered 2017-11-13: 15 mL via OROMUCOSAL
  Filled 2017-11-13: qty 15

## 2017-11-13 MED ORDER — NITROGLYCERIN IN D5W 200-5 MCG/ML-% IV SOLN: 0.0000 ug/min | INTRAVENOUS | Status: DC

## 2017-11-13 MED ORDER — PHENYLEPHRINE HCL-NACL 20-0.9 MG/250ML-% IV SOLN
0.0000 ug/min | INTRAVENOUS | Status: DC
  Filled 2017-11-13: qty 250

## 2017-11-13 MED ORDER — FENTANYL CITRATE (PF) 250 MCG/5ML IJ SOLN
INTRAMUSCULAR | Status: DC | PRN
  Administered 2017-11-13 (×2): 50 ug via INTRAVENOUS

## 2017-11-13 MED ORDER — LIDOCAINE HCL (PF) 1 % IJ SOLN
INTRAMUSCULAR | Status: DC | PRN
  Administered 2017-11-13: 19 mL

## 2017-11-13 MED ORDER — PANTOPRAZOLE SODIUM 40 MG PO TBEC
40.0000 mg | DELAYED_RELEASE_TABLET | Freq: Every day | ORAL | Status: DC
  Administered 2017-11-13 – 2017-11-15 (×3): 40 mg via ORAL
  Filled 2017-11-13 (×3): qty 1

## 2017-11-13 MED ORDER — PROPOFOL 500 MG/50ML IV EMUL
INTRAVENOUS | Status: DC | PRN
  Administered 2017-11-13: 10 ug/kg/min via INTRAVENOUS

## 2017-11-13 MED ORDER — ACETAMINOPHEN 650 MG RE SUPP: 650.0000 mg | Freq: Four times a day (QID) | RECTAL | Status: DC | PRN

## 2017-11-13 MED ORDER — LIDOCAINE HCL (PF) 1 % IJ SOLN
INTRAMUSCULAR | Status: AC
  Filled 2017-11-13: qty 30

## 2017-11-13 MED ORDER — PROPOFOL 10 MG/ML IV BOLUS
INTRAVENOUS | Status: AC
  Filled 2017-11-13: qty 20

## 2017-11-13 MED ORDER — ASPIRIN 81 MG PO CHEW
81.0000 mg | CHEWABLE_TABLET | Freq: Every day | ORAL | Status: DC
  Administered 2017-11-14 – 2017-11-15 (×2): 81 mg via ORAL
  Filled 2017-11-13 (×2): qty 1

## 2017-11-13 MED ORDER — SODIUM CHLORIDE 0.9 % IV SOLN: INTRAVENOUS | Status: DC

## 2017-11-13 MED ORDER — IODIXANOL 320 MG/ML IV SOLN
INTRAVENOUS | Status: DC | PRN
  Administered 2017-11-13: 60.8 mL via INTRAVENOUS

## 2017-11-13 MED ORDER — SODIUM CHLORIDE 0.9% FLUSH: 3.0000 mL | INTRAVENOUS | Status: DC | PRN

## 2017-11-13 MED ORDER — VANCOMYCIN HCL IN DEXTROSE 1-5 GM/200ML-% IV SOLN
1000.0000 mg | Freq: Once | INTRAVENOUS | Status: DC
  Filled 2017-11-13: qty 200

## 2017-11-13 MED ORDER — PROPOFOL 10 MG/ML IV BOLUS
INTRAVENOUS | Status: DC | PRN
  Administered 2017-11-13: 10 mg via INTRAVENOUS

## 2017-11-13 MED ORDER — FENTANYL CITRATE (PF) 250 MCG/5ML IJ SOLN
INTRAMUSCULAR | Status: AC
  Filled 2017-11-13: qty 5

## 2017-11-13 MED ORDER — HEPARIN SODIUM (PORCINE) 1000 UNIT/ML IJ SOLN
INTRAMUSCULAR | Status: AC
  Filled 2017-11-13: qty 1

## 2017-11-13 MED ORDER — SODIUM CHLORIDE 0.9% FLUSH
3.0000 mL | Freq: Two times a day (BID) | INTRAVENOUS | Status: DC
  Administered 2017-11-13 – 2017-11-14 (×2): 3 mL via INTRAVENOUS

## 2017-11-13 MED ORDER — TRAMADOL HCL 50 MG PO TABS: 50.0000 mg | ORAL_TABLET | ORAL | Status: DC | PRN

## 2017-11-13 MED ORDER — ONDANSETRON HCL 4 MG/2ML IJ SOLN
4.0000 mg | Freq: Four times a day (QID) | INTRAMUSCULAR | Status: DC | PRN
  Administered 2017-11-13: 4 mg via INTRAVENOUS

## 2017-11-13 MED ORDER — ONDANSETRON HCL 4 MG/2ML IJ SOLN
INTRAMUSCULAR | Status: AC
  Filled 2017-11-13: qty 2

## 2017-11-13 MED ORDER — SODIUM CHLORIDE 0.9 % IV SOLN
INTRAVENOUS | Status: AC
  Filled 2017-11-13 (×3): qty 1.2

## 2017-11-13 MED ORDER — SODIUM CHLORIDE 0.9 % IV SOLN: 12.5000 mg | Freq: Once | INTRAVENOUS | Status: DC

## 2017-11-13 MED ORDER — LIDOCAINE HCL 1 % IJ SOLN
INTRAMUSCULAR | Status: AC
  Filled 2017-11-13: qty 20

## 2017-11-13 MED ORDER — LIDOCAINE HCL (PF) 1 % IJ SOLN
INTRAMUSCULAR | Status: AC
  Filled 2017-11-13: qty 2

## 2017-11-13 MED ORDER — MUPIROCIN 2 % EX OINT
1.0000 "application " | TOPICAL_OINTMENT | Freq: Two times a day (BID) | CUTANEOUS | Status: DC
  Administered 2017-11-13 – 2017-11-14 (×3): 1 via NASAL
  Filled 2017-11-13: qty 22

## 2017-11-13 MED ORDER — GABAPENTIN 400 MG PO CAPS
800.0000 mg | ORAL_CAPSULE | Freq: Two times a day (BID) | ORAL | Status: DC
  Administered 2017-11-13 – 2017-11-15 (×4): 800 mg via ORAL
  Filled 2017-11-13 (×4): qty 2

## 2017-11-13 MED ORDER — CHLORHEXIDINE GLUCONATE 4 % EX LIQD: 30.0000 mL | CUTANEOUS | Status: DC

## 2017-11-13 MED ORDER — METOPROLOL TARTRATE 5 MG/5ML IV SOLN
2.5000 mg | INTRAVENOUS | Status: DC | PRN
  Administered 2017-11-14: 5 mg via INTRAVENOUS
  Filled 2017-11-13: qty 5

## 2017-11-13 SURGICAL SUPPLY — 90 items
ADH SKN CLS APL DERMABOND .7 (GAUZE/BANDAGES/DRESSINGS) ×2
BAG DECANTER FOR FLEXI CONT (MISCELLANEOUS) IMPLANT
BAG SNAP BAND KOVER 36X36 (MISCELLANEOUS) ×4 IMPLANT
BLADE CLIPPER SURG (BLADE) IMPLANT
BLADE OSCILLATING /SAGITTAL (BLADE) IMPLANT
BLADE STERNUM SYSTEM 6 (BLADE) IMPLANT
CABLE ADAPT CONN TEMP 6FT (ADAPTER) ×4 IMPLANT
CANNULA FEM VENOUS REMOTE 22FR (CANNULA) IMPLANT
CANNULA OPTISITE PERFUSION 16F (CANNULA) IMPLANT
CANNULA OPTISITE PERFUSION 18F (CANNULA) IMPLANT
CATH DIAG EXPO 6F VENT PIG 145 (CATHETERS) ×8 IMPLANT
CATH EXPO 5FR AL1 (CATHETERS) IMPLANT
CATH INFINITI 6F AL2 (CATHETERS) IMPLANT
CATH S G BIP PACING (SET/KITS/TRAYS/PACK) ×4 IMPLANT
CLIP VESOCCLUDE MED 24/CT (CLIP) IMPLANT
CLIP VESOCCLUDE SM WIDE 24/CT (CLIP) IMPLANT
CONT SPEC 4OZ CLIKSEAL STRL BL (MISCELLANEOUS) ×8 IMPLANT
COVER BACK TABLE 24X17X13 BIG (DRAPES) IMPLANT
COVER BACK TABLE 80X110 HD (DRAPES) ×4 IMPLANT
COVER DOME SNAP 22 D (MISCELLANEOUS) IMPLANT
COVER WAND RF STERILE (DRAPES) ×4 IMPLANT
CRADLE DONUT ADULT HEAD (MISCELLANEOUS) ×4 IMPLANT
DERMABOND ADVANCED (GAUZE/BANDAGES/DRESSINGS) ×2
DERMABOND ADVANCED .7 DNX12 (GAUZE/BANDAGES/DRESSINGS) ×2 IMPLANT
DEVICE CLOSURE PERCLS PRGLD 6F (VASCULAR PRODUCTS) ×4 IMPLANT
DRAPE INCISE IOBAN 66X45 STRL (DRAPES) IMPLANT
DRSG TEGADERM 4X4.75 (GAUZE/BANDAGES/DRESSINGS) ×8 IMPLANT
ELECT CAUTERY BLADE 6.4 (BLADE) IMPLANT
ELECT REM PT RETURN 9FT ADLT (ELECTROSURGICAL) ×8
ELECTRODE REM PT RTRN 9FT ADLT (ELECTROSURGICAL) ×4 IMPLANT
FELT TEFLON 6X6 (MISCELLANEOUS) IMPLANT
FEMORAL VENOUS CANN RAP (CANNULA) IMPLANT
GAUZE SPONGE 4X4 12PLY STRL (GAUZE/BANDAGES/DRESSINGS) ×4 IMPLANT
GAUZE SPONGE 4X4 12PLY STRL LF (GAUZE/BANDAGES/DRESSINGS) ×3 IMPLANT
GLOVE BIO SURGEON STRL SZ7.5 (GLOVE) ×4 IMPLANT
GLOVE BIO SURGEON STRL SZ8 (GLOVE) IMPLANT
GLOVE EUDERMIC 7 POWDERFREE (GLOVE) IMPLANT
GLOVE ORTHO TXT STRL SZ7.5 (GLOVE) IMPLANT
GOWN STRL REUS W/ TWL LRG LVL3 (GOWN DISPOSABLE) IMPLANT
GOWN STRL REUS W/ TWL XL LVL3 (GOWN DISPOSABLE) ×2 IMPLANT
GOWN STRL REUS W/TWL LRG LVL3 (GOWN DISPOSABLE)
GOWN STRL REUS W/TWL XL LVL3 (GOWN DISPOSABLE) ×4
GUIDEWIRE SAFE TJ AMPLATZ EXST (WIRE) ×4 IMPLANT
GUIDEWIRE STRAIGHT .035 260CM (WIRE) ×4 IMPLANT
INSERT FOGARTY SM (MISCELLANEOUS) IMPLANT
KIT BASIN OR (CUSTOM PROCEDURE TRAY) ×4 IMPLANT
KIT DILATOR VASC 18G NDL (KITS) IMPLANT
KIT HEART LEFT (KITS) ×4 IMPLANT
KIT SUCTION CATH 14FR (SUCTIONS) IMPLANT
KIT TURNOVER KIT B (KITS) ×4 IMPLANT
LOOP VESSEL MAXI BLUE (MISCELLANEOUS) IMPLANT
LOOP VESSEL MINI RED (MISCELLANEOUS) IMPLANT
NDL PERC 18GX7CM (NEEDLE) ×1 IMPLANT
NEEDLE 22X1 1/2 (OR ONLY) (NEEDLE) IMPLANT
NEEDLE PERC 18GX7CM (NEEDLE) ×4 IMPLANT
NS IRRIG 1000ML POUR BTL (IV SOLUTION) ×4 IMPLANT
PACK ENDOVASCULAR (PACKS) ×4 IMPLANT
PAD ARMBOARD 7.5X6 YLW CONV (MISCELLANEOUS) ×8 IMPLANT
PAD ELECT DEFIB RADIOL ZOLL (MISCELLANEOUS) ×4 IMPLANT
PENCIL BUTTON HOLSTER BLD 10FT (ELECTRODE) IMPLANT
PERCLOSE PROGLIDE 6F (VASCULAR PRODUCTS) ×8
SET MICROPUNCTURE 5F STIFF (MISCELLANEOUS) ×4 IMPLANT
SHEATH BRITE TIP 6FR 35CM (SHEATH) ×4 IMPLANT
SHEATH PINNACLE 6F 10CM (SHEATH) ×4 IMPLANT
SHEATH PINNACLE 8F 10CM (SHEATH) ×4 IMPLANT
SLEEVE REPOSITIONING LENGTH 30 (MISCELLANEOUS) ×4 IMPLANT
SPONGE LAP 4X18 RFD (DISPOSABLE) IMPLANT
STOPCOCK MORSE 400PSI 3WAY (MISCELLANEOUS) ×8 IMPLANT
SUT ETHIBOND X763 2 0 SH 1 (SUTURE) IMPLANT
SUT GORETEX CV 4 TH 22 36 (SUTURE) IMPLANT
SUT GORETEX CV4 TH-18 (SUTURE) IMPLANT
SUT MNCRL AB 3-0 PS2 18 (SUTURE) IMPLANT
SUT PROLENE 5 0 C 1 36 (SUTURE) IMPLANT
SUT PROLENE 6 0 C 1 30 (SUTURE) IMPLANT
SUT SILK  1 MH (SUTURE) ×2
SUT SILK 1 MH (SUTURE) ×2 IMPLANT
SUT VIC AB 2-0 CT1 27 (SUTURE)
SUT VIC AB 2-0 CT1 TAPERPNT 27 (SUTURE) IMPLANT
SUT VIC AB 2-0 CTX 36 (SUTURE) IMPLANT
SUT VIC AB 3-0 SH 8-18 (SUTURE) IMPLANT
SYR 30ML LL (SYRINGE) ×9 IMPLANT
SYR 50ML LL SCALE MARK (SYRINGE) ×4 IMPLANT
SYR BULB IRRIGATION 50ML (SYRINGE) IMPLANT
SYR CONTROL 10ML LL (SYRINGE) IMPLANT
TAPE CLOTH SURG 4X10 WHT LF (GAUZE/BANDAGES/DRESSINGS) ×3 IMPLANT
TOWEL GREEN STERILE (TOWEL DISPOSABLE) ×8 IMPLANT
TRANSDUCER W/STOPCOCK (MISCELLANEOUS) ×8 IMPLANT
TRAY FOLEY SLVR 16FR TEMP STAT (SET/KITS/TRAYS/PACK) IMPLANT
VALVE HEART TRANSCATH SZ3 26MM (Prosthesis & Implant Heart) ×3 IMPLANT
WIRE .035 3MM-J 145CM (WIRE) ×4 IMPLANT

## 2017-11-13 NOTE — Progress Notes (Signed)
Patient resting at this time states nausea is better. Will monitor patient. Kyna Blahnik, Bettina Gavia RN

## 2017-11-13 NOTE — CV Procedure (Signed)
[facility]  TAVR OPERATIVE NOTE   Date of Procedure:  11/13/2017  Preoperative Diagnosis: Severe Aortic Stenosis   Postoperative Diagnosis: Same   Procedure:    Transcatheter Aortic Valve Replacement - Transfemoral Approach  Edwards Sapien 3 THV (size 26 mm, model # [id number], serial # [id number])   Co-Surgeons:  Lauree Chandler, MD and Gaye Pollack, MD   Anesthesiologist:  Annye Asa  Echocardiographer:  Johnsie Cancel  Pre-operative Echo Findings:  Severe aortic stenosis  Normal left ventricular systolic function  Post-operative Echo Findings:  No paravalvular leak  Normal left ventricular systolic function  BRIEF CLINICAL NOTE AND INDICATIONS FOR SURGERY  77 yo female with history of CAD, atrial fibrillation on coumadin, HTN, carotid artery disease, chronic venous insufficiency, prior DVT and severe aortic stenosis. Cardiac cath with non-obstructive CAD. Echo with normal LV systolic function, severe AS.   During the course of the patient's preoperative work up they have been evaluated comprehensively by a multidisciplinary team of specialists coordinated through the [facility] in the [facility].  They have been demonstrated to suffer from symptomatic severe aortic stenosis as noted above. The patient has been counseled extensively as to the relative risks and benefits of all options for the treatment of severe aortic stenosis including long term medical therapy, conventional surgery for aortic valve replacement, and transcatheter aortic valve replacement.  The patient has been independently evaluated by two cardiac surgeons including Dr Roxy Manns and Dr. Cyndia Bent, and they are felt to be at high risk for conventional surgical aortic valve replacement. Both surgeons indicated the patient would be a poor candidate for conventional surgery. Based upon review of all of the patient's preoperative diagnostic tests  they are felt to be candidate for transcatheter aortic valve replacement using the transfemoral approach as an alternative to high risk conventional surgery.    Following the decision to proceed with transcatheter aortic valve replacement, a discussion has been held regarding what types of management strategies would be attempted intraoperatively in the event of life-threatening complications, including whether or not the patient would be considered a candidate for the use of cardiopulmonary bypass and/or conversion to open sternotomy for attempted surgical intervention.  The patient has been advised of a variety of complications that might develop peculiar to this approach including but not limited to risks of death, stroke, paravalvular leak, aortic dissection or other major vascular complications, aortic annulus rupture, device embolization, cardiac rupture or perforation, acute myocardial infarction, arrhythmia, heart block or bradycardia requiring permanent pacemaker placement, congestive heart failure, respiratory failure, renal failure, pneumonia, infection, other late complications related to structural valve deterioration or migration, or other complications that might ultimately cause a temporary or permanent loss of functional independence or other long term morbidity.  The patient provides full informed consent for the procedure as described and all questions were answered preoperatively.    DETAILS OF THE OPERATIVE PROCEDURE  PREPARATION:   The patient is brought to the operating room on the above mentioned date and central monitoring was established by the anesthesia team including placement of a radial arterial line. The patient is placed in the supine position on the operating table.  Intravenous antibiotics are administered. Conscious sedation is used.   Baseline transthoracic echocardiogram was performed. The patient's chest, abdomen, both groins, and both lower extremities are prepared  and draped in a sterile manner. A time out procedure is performed.   PERIPHERAL ACCESS:   Using the modified Seldinger technique, femoral  arterial and venous access were obtained with placement of 6 Fr sheaths on the left side.  A pigtail diagnostic catheter was passed through the femoral arterial sheath under fluoroscopic guidance into the aortic root.  A temporary transvenous pacemaker catheter was passed through the femoral venous sheath under fluoroscopic guidance into the right ventricle.  The pacemaker was tested to ensure stable lead placement and pacemaker capture. Aortic root angiography was performed in order to determine the optimal angiographic angle for valve deployment.  TRANSFEMORAL ACCESS:  A micropuncture kit was used to gain access to the right femoral artery using u/s guidance. Position confirmed with angiography. Pre-closure with double ProGlide closure devices. The patient was heparinized systemically and ACT verified > 250 seconds.    A 14 Fr transfemoral E-sheath was introduced into the right femoral artery after progressively dilating over an Amplatz superstiff wire. An AL-1 catheter was used to direct a straight-tip exchange length wire across the native aortic valve into the left ventricle. This was exchanged out for a pigtail catheter and position was confirmed in the LV apex. Simultaneous LV and Ao pressures were recorded.  The pigtail catheter was then exchanged for an Amplatz Extra-stiff wire in the LV apex.   TRANSCATHETER HEART VALVE DEPLOYMENT:  An Edwards Sapien 3 THV (size 26 mm) was prepared and crimped per manufacturer's guidelines, and the proper orientation of the valve is confirmed on the [employer] delivery system. The valve was advanced through the introducer sheath using normal technique until in an appropriate position in the abdominal aorta beyond the sheath tip. The balloon was then retracted and using the fine-tuning wheel was centered on the valve.  The valve was then advanced across the aortic arch using appropriate flexion of the catheter. The valve was carefully positioned across the aortic valve annulus. The Commander catheter was retracted using normal technique. Once final position of the valve has been confirmed by angiographic assessment, the valve is deployed while temporarily holding ventilation and during rapid ventricular pacing to maintain systolic blood pressure < 50 mmHg and pulse pressure < 10 mmHg. The balloon inflation is held for >3 seconds after reaching full deployment volume. Once the balloon has fully deflated the balloon is retracted into the ascending aorta and valve function is assessed using TTE. There is felt to be no paravalvular leak and no central aortic insufficiency.  The patient's hemodynamic recovery following valve deployment is good.  The deployment balloon and guidewire are both removed. Echo demostrated acceptable post-procedural gradients, stable mitral valve function, and no AI.   PROCEDURE COMPLETION:  The sheath was then removed and closure devices were completed. Protamine was administered once femoral arterial repair was complete. The temporary pacemaker, pigtail catheters and femoral sheaths were removed with manual pressure used for hemostasis.   The patient tolerated the procedure well and is transported to the surgical intensive care in stable condition. There were no immediate intraoperative complications. All sponge instrument and needle counts are verified correct at completion of the operation.   No blood products were administered during the operation.  The patient received a total of 60.8 mL of intravenous contrast during the procedure.  Lauree Chandler MD 11/13/2017 9:38 AM

## 2017-11-13 NOTE — Anesthesia Procedure Notes (Signed)
Procedure Name: MAC Date/Time: 11/13/2017 7:33 AM Performed by: Mariea Clonts, CRNA Pre-anesthesia Checklist: Patient identified, Emergency Drugs available, Suction available, Patient being monitored and Timeout performed Patient Re-evaluated:Patient Re-evaluated prior to induction Oxygen Delivery Method: Nasal cannula and Simple face mask

## 2017-11-13 NOTE — Progress Notes (Signed)
  Echocardiogram 2D Echocardiogram has been performed.  Darlina Sicilian M 11/13/2017, 9:11 AM

## 2017-11-13 NOTE — Transfer of Care (Signed)
Immediate Anesthesia Transfer of Care Note  Patient: Mikayla [name]  Procedure(s) Performed: TRANSCATHETER AORTIC VALVE REPLACEMENT, TRANSFEMORAL using a 87m Edwards Sapien 3 Aortic Valve (N/A Chest) INTRAOPERATIVE TRANSTHORACIC ECHOCARDIOGRAM (N/A Chest)  Patient Location: Cath Lab  Anesthesia Type:MAC  Level of Consciousness: awake, alert  and oriented  Airway & Oxygen Therapy: Patient Spontanous Breathing and Patient connected to nasal cannula oxygen  Post-op Assessment: Report given to RN, Post -op Vital signs reviewed and stable and Patient moving all extremities X 4  Post vital signs: Reviewed and stable  Last Vitals:  Vitals Value Taken Time  BP 98/40 11/13/2017  9:37 AM  Temp    Pulse 60 11/13/2017  9:41 AM  Resp 11 11/13/2017  9:41 AM  SpO2 100 % 11/13/2017  9:41 AM  Vitals shown include unvalidated device data.  Last Pain: There were no vitals filed for this visit.    Patients Stated Pain Goal: 2 [id number]  Complications: No apparent anesthesia complications

## 2017-11-13 NOTE — Progress Notes (Addendum)
Patient with complaints of chills, warm blankets applied and medication on order from pharmacy that patient states "helps with the chills". Will continue to monitor patient. Jomo Forand, Bettina Gavia RN

## 2017-11-13 NOTE — Anesthesia Procedure Notes (Signed)
Arterial Line Insertion Start/End10/09/2017 7:00 AM, 11/13/2017 7:10 AM Performed by: CRNA  Patient location: Pre-op. Preanesthetic checklist: patient identified, IV checked, site marked, risks and benefits discussed, surgical consent, monitors and equipment checked, pre-op evaluation, timeout performed and anesthesia consent Lidocaine 1% used for infiltration Left, radial was placed Catheter size: 20 G Hand hygiene performed  and maximum sterile barriers used   Attempts: 2 Procedure performed without using ultrasound guided technique. Following insertion, Biopatch and dressing applied. Post procedure assessment: normal  Patient tolerated the procedure well with no immediate complications.

## 2017-11-13 NOTE — Progress Notes (Signed)
Patient arrived to OR alert and oriented. Patient able to confirm name, DOB, procedure, allergies, metal in left hip and foot, npo status and no pain at this time. Patient moved over to OR table with maximum assistance.   Leatha Gilding, RN

## 2017-11-13 NOTE — Progress Notes (Signed)
Site area: LFA/LFV Site Prior to Removal:  Level 0 Pressure Applied For:20 min Manual:   yes Patient Status During Pull:  stable Post Pull Site:  Level 0 Post Pull Instructions Given:  yes Post Pull Pulses Present: palpable Dressing Applied:  clear Bedrest begins @ 1000 till 1400 Comments:removed by Alison Murray

## 2017-11-13 NOTE — Progress Notes (Signed)
Patient arrived from cath lab to 4e07  Patient with bilateral groins level 0 no hematoma noted at this time. Vital signs were obtained and patient placed on monitor. Patient has call bell. Patient also with complaints of nausea Zofran given as ordered for nausea. Wing Schoch, Bettina Gavia RN

## 2017-11-13 NOTE — Progress Notes (Signed)
Patient up to chair and ambulated to bathroom after bed rest completed. Patient B groin level 0.  No bleeding or hematoma noted. Will continue to monitor patient. Ladeana Laplant, Bettina Gavia RN

## 2017-11-13 NOTE — Progress Notes (Addendum)
  [place] VALVE TEAM  Patient doing well s/p TAVR. She is hemodynamically stable. Groin sites stable. ECG with sinus and no high grade block. Arterial line discontinued and transfer/red  to 4E. Plan for early ambulation after bedrest completed and hopeful discharge over the next 24-48 hours.   She is having significant nausea that is not improving with zofran. Will try some phenergan.   Angelena Form PA-C  [facility]  Pager [phone]

## 2017-11-13 NOTE — Progress Notes (Signed)
Patient still extremely nauseated after zofran given  as ordered as needed for nausea. Cool cloth given to patent for head. Angelena Form [facility] made aware orders received. Will continue to monitor patient. Taesha Goodell, Bettina Gavia RN

## 2017-11-13 NOTE — Interval H&P Note (Signed)
.  History and Physical Interval Note:  11/13/2017 7:16 AM  [name]  has presented today for surgery, with the diagnosis of Severe Aortic Stenosis  The various methods of treatment have been discussed with the patient and family. After consideration of risks, benefits and other options for treatment, the patient has consented to  Procedure(s): TRANSCATHETER AORTIC VALVE REPLACEMENT, TRANSFEMORAL (N/A) INTRAOPERATIVE TRANSTHORACIC ECHOCARDIOGRAM as a surgical intervention .  The patient's history has been reviewed, patient examined, no change in status, stable for surgery.  I have reviewed the patient's chart and labs.  Questions were answered to the patient's satisfaction.      Shortness of breath: Yes.   If yes: with what activity?: any activity Worse than previously noted?: Yes.    New edema, PND, orthopnea: No.  Recent decrease in activity i.e. more difficulty walking to mailbox, climbing stairs, etc: Yes.    Changes in sleeping i.e. need to utilize to sleep on more pillows, sitting up, etc: No.  Changes since last seen in pre-op visit: Yes.    Gaye Pollack

## 2017-11-13 NOTE — Anesthesia Procedure Notes (Signed)
Central Venous Catheter Insertion Performed by: Roderic Palau, MD, anesthesiologist Start/End10/09/2017 6:45 AM, 11/13/2017 6:55 AM Patient location: Pre-op. Preanesthetic checklist: patient identified, IV checked, site marked, risks and benefits discussed, surgical consent, monitors and equipment checked, pre-op evaluation, timeout performed and anesthesia consent Position: Trendelenburg Lidocaine 1% used for infiltration and patient sedated Hand hygiene performed , maximum sterile barriers used  and Seldinger technique used Catheter size: 8 Fr Total catheter length 16. Central line was placed.Double lumen Procedure performed using ultrasound guided technique. Ultrasound Notes:anatomy identified, needle tip was noted to be adjacent to the nerve/plexus identified, no ultrasound evidence of intravascular and/or intraneural injection and image(s) printed for medical record Attempts: 1 Following insertion, dressing applied, line sutured and Biopatch. Post procedure assessment: blood return through all ports  Patient tolerated the procedure well with no immediate complications.

## 2017-11-13 NOTE — Discharge Instructions (Signed)

## 2017-11-13 NOTE — Op Note (Signed)
[facility]   MULTIDISCIPLINARY HEART VALVE TEAM   TAVR OPERATIVE NOTE   Date of Procedure:  11/13/2017  Preoperative Diagnosis: Severe Aortic Stenosis   Postoperative Diagnosis: Same   Procedure:    Transcatheter Aortic Valve Replacement - Percutaneous Right Transfemoral Approach  Edwards Sapien 3 THV (size 26 mm, model # 9600TFX, serial # [id number])   Co-Surgeons:  Gaye Pollack, MD and Lauree Chandler, MD    Anesthesiologist:  Annye Asa, MD  Echocardiographer:  Jenkins Rouge, MD  Pre-operative Echo Findings:  Severe aortic stenosis  Normal left ventricular systolic function  Post-operative Echo Findings:  No paravalvular leak  Normal left ventricular systolic function   BRIEF CLINICAL NOTE AND INDICATIONS FOR SURGERY  This 77 year old Mikayla [name] has stage D, severe, symptomatic aortic stenosis with New York Heart Association class II-III symptoms of exertional fatigue and shortness of breath consistent with chronic diastolic congestive heart failure. She is also had frequent episodes of dizziness with ambulation and some lower extremity edema. I have personally reviewed her echocardiogram, cardiac cath, and CTA studies. Her echocardiogram shows a trileaflet aortic valve with moderate thickening and calcification and severely reduced mobility. The mean gradient has increased significantly over the past year to 46 mmHg with a dimensionless index of 0.22 consistent with severe aortic stenosis. Left ventricular ejection fraction is 60 to 65%. Cardiac catheterization shows mild nonobstructive coronary disease. The mean gradient across aortic valve the catheterization was 26 mmHg and the peak to peak gradient was 21 mmHg. I think aortic valve replacement is indicated in this patient with progressive symptoms that are limiting her ability to be active. She would be a low risk candidate for open surgical aortic valve replacement based on the STS PROM score  but think she would have a prolonged recovery due to her age and bilateral chronic leg weakness and inability to use her legs to get herself up out of a chair. She is very dependent on her upper body for mobilization which would be a significant problem after a sternotomy. I think transcatheter aortic valve replacement would be the best option for this patient. Her gated cardiac CTA shows anatomy suitable for transcatheter aortic valve replacement with no complicating features. Her abdominal and pelvic CTA shows adequate pelvic vascular anatomy for transfemoral insertion.  The patient and her family were counseled at length regarding treatment alternatives for management of severe symptomatic aortic stenosis. The risks and benefits of surgical intervention has been discussed in detail. Long-term prognosis with medical therapy was discussed. Alternative approaches such as conventional surgical aortic valve replacement, transcatheter aortic valve replacement, and palliative medical therapy were compared and contrasted at length. This discussion was placed in the context of the patient's own specific clinical presentation and past medical history. All of their questions have been addressed.   Following the decision to proceed with transcatheter aortic valve replacement, a discussion was held regarding what types of management strategies would be attempted intraoperatively in the event of life-threatening complications, including whether or not the patient would be considered a candidate for the use of cardiopulmonary bypass and/or conversion to open sternotomy for attempted surgical intervention.   The patient has been advised of a variety of complications that might develop including but not limited to risks of death, stroke, paravalvular leak, aortic dissection or other major vascular complications, aortic annulus rupture, device embolization, cardiac rupture or perforation, mitral regurgitation, acute  myocardial infarction, arrhythmia, heart block or bradycardia requiring permanent pacemaker placement, congestive heart failure, respiratory failure,  renal failure, pneumonia, infection, other late complications related to structural valve deterioration or migration, or other complications that might ultimately cause a temporary or permanent loss of functional independence or other long term morbidity. The patient provides full informed consent for the procedure as described and all questions were answered.     DETAILS OF THE OPERATIVE PROCEDURE  PREPARATION:    The patient is brought to the operating room on the above mentioned date and central monitoring was established by the anesthesia team including placement of a central venous line and radial arterial line. The patient is placed in the supine position on the operating table.  Intravenous antibiotics are administered. The patient is monitored closely throughout the procedure under conscious sedation. Baseline transthoracic echocardiogram was performed. The patient's chest, abdomen, both groins, and both lower extremities are prepared and draped in a sterile manner. A time out procedure is performed.   PERIPHERAL ACCESS:    Using the modified Seldinger technique, femoral arterial and venous access was obtained with placement of 6 Fr sheaths on the left side.  A pigtail diagnostic catheter was passed through the left arterial sheath under fluoroscopic guidance into the aortic root.  A temporary transvenous pacemaker catheter was passed through the left femoral venous sheath under fluoroscopic guidance into the right ventricle.  The pacemaker was tested to ensure stable lead placement and pacemaker capture. Aortic root angiography was performed in order to determine the optimal angiographic angle for valve deployment.   TRANSFEMORAL ACCESS:   Percutaneous transfemoral access and sheath placement was performed using ultrasound guidance.  The  right common femoral artery was cannulated using a micropuncture needle and appropriate location was verified using hand injection angiogram.  A pair of Abbott Perclose percutaneous closure devices were placed and a 6 French sheath replaced into the femoral artery.  The patient was heparinized systemically and ACT verified > 250 seconds.    A 14 Fr transfemoral E-sheath was introduced into the right femoral artery after progressively dilating over an Amplatz superstiff wire. An AL-1 catheter was used to direct a straight-tip exchange length wire across the native aortic valve into the left ventricle. This was exchanged out for a pigtail catheter and position was confirmed in the LV apex. Simultaneous LV and Ao pressures were recorded.  The pigtail catheter was exchanged for an Amplatz Extra-stiff wire in the LV apex.    BALLOON AORTIC VALVULOPLASTY:   Not performed  TRANSCATHETER HEART VALVE DEPLOYMENT:   An Edwards Sapien 3 transcatheter heart valve (size 26 mm, model #9600TFX, serial #[id number]) was prepared and crimped per manufacturer's guidelines, and the proper orientation of the valve is confirmed on the [employer] delivery system. The valve was advanced through the introducer sheath using normal technique until in an appropriate position in the abdominal aorta beyond the sheath tip. The balloon was then retracted and using the fine-tuning wheel was centered on the valve. The valve was then advanced across the aortic arch using appropriate flexion of the catheter. The valve was carefully positioned across the aortic valve annulus. The Commander catheter was retracted using normal technique. Once final position of the valve has been confirmed by angiographic assessment, the valve is deployed while temporarily holding ventilation and during rapid ventricular pacing to maintain systolic blood pressure < 50 mmHg and pulse pressure < 10 mmHg. The balloon inflation is held for >3 seconds after  reaching full deployment volume. Once the balloon has fully deflated the balloon is retracted into the ascending aorta and valve  function is assessed using echocardiography. There is felt to be no paravalvular leak and no central aortic insufficiency.  The patient's hemodynamic recovery following valve deployment is good.  The deployment balloon and guidewire are both removed.    PROCEDURE COMPLETION:   The sheath was removed and femoral artery closure performed using the previously placed Perclose devices.   Protamine was administered once femoral arterial repair was complete. The temporary pacemaker, pigtail catheters and femoral sheaths were removed with manual pressure used for hemostasis.   The patient tolerated the procedure well and is transported to the surgical intensive care in stable condition. There were no immediate intraoperative complications. All sponge instrument and needle counts are verified correct at completion of the operation.   No blood products were administered during the operation.  The patient received a total of 60.8 mL of intravenous contrast during the procedure.   Gaye Pollack, MD 11/13/2017 1:17 PM

## 2017-11-14 ENCOUNTER — Encounter: Payer: Self-pay | Admitting: Thoracic Surgery (Cardiothoracic Vascular Surgery)

## 2017-11-14 ENCOUNTER — Inpatient Hospital Stay (HOSPITAL_COMMUNITY): Payer: Medicare Other

## 2017-11-14 DIAGNOSIS — I10 Essential (primary) hypertension: Secondary | ICD-10-CM

## 2017-11-14 DIAGNOSIS — I361 Nonrheumatic tricuspid (valve) insufficiency: Secondary | ICD-10-CM

## 2017-11-14 DIAGNOSIS — I340 Nonrheumatic mitral (valve) insufficiency: Secondary | ICD-10-CM

## 2017-11-14 DIAGNOSIS — I4891 Unspecified atrial fibrillation: Secondary | ICD-10-CM

## 2017-11-14 LAB — CBC
HEMATOCRIT: 34.7 % — AB (ref 36.0–46.0)
Hemoglobin: 11.2 g/dL — ABNORMAL LOW (ref 12.0–15.0)
MCH: 30.4 pg (ref 26.0–34.0)
MCHC: 32.3 g/dL (ref 30.0–36.0)
MCV: 94.3 fL (ref 80.0–100.0)
NRBC: 0 % (ref 0.0–0.2)
Platelets: 155 10*3/uL (ref 150–400)
RBC: 3.68 MIL/uL — ABNORMAL LOW (ref 3.87–5.11)
RDW: 13.1 % (ref 11.5–15.5)
WBC: 10.6 10*3/uL — ABNORMAL HIGH (ref 4.0–10.5)

## 2017-11-14 LAB — BASIC METABOLIC PANEL
ANION GAP: 3 — AB (ref 5–15)
BUN: 11 mg/dL (ref 8–23)
CO2: 27 mmol/L (ref 22–32)
Calcium: 8.2 mg/dL — ABNORMAL LOW (ref 8.9–10.3)
Chloride: 109 mmol/L (ref 98–111)
Creatinine, Ser: 0.63 mg/dL (ref 0.44–1.00)
GFR calc Af Amer: >60 mL/min (ref >60)
Glucose, Bld: 104 mg/dL — ABNORMAL HIGH (ref 70–99)
POTASSIUM: 3.9 mmol/L (ref 3.5–5.1)
Sodium: 139 mmol/L (ref 135–145)

## 2017-11-14 LAB — ECHOCARDIOGRAM LIMITED
HEIGHTINCHES: 65 in
WEIGHTICAEL: 2663.16 [oz_av]

## 2017-11-14 LAB — MAGNESIUM: MAGNESIUM: 1.8 mg/dL (ref 1.7–2.4)

## 2017-11-14 MED ORDER — DILTIAZEM HCL ER COATED BEADS 240 MG PO CP24
240.0000 mg | ORAL_CAPSULE | Freq: Every day | ORAL | Status: DC
  Administered 2017-11-14 – 2017-11-15 (×2): 240 mg via ORAL
  Filled 2017-11-14 (×2): qty 1

## 2017-11-14 MED ORDER — METOPROLOL TARTRATE 25 MG PO TABS
25.0000 mg | ORAL_TABLET | Freq: Two times a day (BID) | ORAL | Status: DC
  Administered 2017-11-14 – 2017-11-15 (×3): 25 mg via ORAL
  Filled 2017-11-14 (×3): qty 1

## 2017-11-14 MED FILL — Heparin Sodium (Porcine) Inj 1000 Unit/ML: INTRAMUSCULAR | Qty: 30 | Status: AC

## 2017-11-14 MED FILL — Phenylephrine HCl IV Soln 10 MG/ML: INTRAVENOUS | Qty: 2 | Status: AC

## 2017-11-14 MED FILL — Magnesium Sulfate Inj 50%: INTRAMUSCULAR | Qty: 10 | Status: AC

## 2017-11-14 MED FILL — Potassium Chloride Inj 2 mEq/ML: INTRAVENOUS | Qty: 40 | Status: AC

## 2017-11-14 MED FILL — Sodium Chloride IV Soln 0.9%: INTRAVENOUS | Qty: 1000 | Status: AC

## 2017-11-14 NOTE — Progress Notes (Signed)
@  0700 Paged [facility]y [facility] of Cardiology regarding pt's conversion to Afib RVR @0636  this AM  (HR 150s-170s). BP stable (120s/60s) and pt states she feels otherwise normal. PRN IV Metoprolol 5mg  given with better rate control 90s-120s). Pt educated on situation. Pt's daughter called for update this AM and informed of arrhythmia. Will continue to monitor and assess.  Page returned. Order for STAT EKG obtained and PA endorsed to continue use of PRN IV Metoprolol to control HR. PA endorsed she would notify TAVR team.  EKG obtained confirming A-fib RVR.  Day RN, Ander Purpura, and pt updated on above.

## 2017-11-14 NOTE — Progress Notes (Signed)
CARDIAC REHAB PHASE I   PRE:  Rate/Rhythm: 93 afib  BP:  Supine:   Sitting: 100/79  Standing:    SaO2: 95%RA  MODE:  Ambulation: 260 ft   POST:  Rate/Rhythm: 109 afib  BP:  Supine: 124/81  Sitting:   Standing:    SaO2: 98%RA 1350-1420 Pt walked 260 ft on RA with rolling walker and minimal asst. To bed after walk. Tolerated well. Has walker at home if needed. Reviewed walking for ex and gave low sodium diet. Referred to [place] [facility] in case pt decides to attend.   Graylon Good, RN BSN  11/14/2017 2:14 PM

## 2017-11-14 NOTE — Progress Notes (Addendum)
[place] VALVE TEAM  Patient Name: Mikayla [name] Date of Encounter: 11/14/2017  Primary Cardiologist: Dr. Percival Spanish / Dr. Angelena Form & Dr. Cyndia Bent (TAVR)  Hospital Problem List     Principal Problem:   S/P TAVR (transcatheter aortic valve replacement) Active Problems:   Benign essential HTN   Chronic venous insufficiency   GERD (gastroesophageal reflux disease)   History of DVT (deep vein thrombosis)   Severe aortic stenosis   Hyperlipidemia   Macular degeneration   PAF (paroxysmal atrial fibrillation) (HCC)   Acute on chronic diastolic heart failure (HCC)     Subjective   No complaints. Feeling well. Has been up walking and breathing well.   Inpatient Medications    Scheduled Meds: . aspirin  81 mg Oral Daily  . Chlorhexidine Gluconate Cloth  6 each Topical Daily  . diltiazem  240 mg Oral Daily  . gabapentin  800 mg Oral BID  . metoprolol tartrate  25 mg Oral BID  . mupirocin ointment  1 application Nasal BID  . pantoprazole  40 mg Oral Daily  . sodium chloride flush  3 mL Intravenous Q12H   Continuous Infusions: . sodium chloride    . cefUROXime (ZINACEF)  IV 1.5 g (11/14/17 0842)  . nitroGLYCERIN     PRN Meds: sodium chloride, acetaminophen **OR** acetaminophen, hyoscyamine, metoprolol tartrate, morphine injection, ondansetron (ZOFRAN) IV, oxyCODONE, promethazine, sodium chloride flush, traMADol   Vital Signs    Vitals:   11/14/17 0730 11/14/17 0747 11/14/17 0807 11/14/17 0834  BP: 121/76  (!) 157/74   Pulse: (!) 106 (!) 126 (!) 131 (!) 150  Resp: 19  16   Temp:   99 F (37.2 C)   TempSrc:   Oral   SpO2: 94%  95%   Weight:      Height:        Intake/Output Summary (Last 24 hours) at 11/14/2017 1001 Last data filed at 11/14/2017 0000 Gross per 24 hour  Intake 836.94 ml  Output 1100 ml  Net -263.06 ml   Filed Weights   11/13/17 0612 11/14/17 0605  Weight: 75.6 kg 75.5 kg    Physical Exam   GEN: Well  nourished, well developed, in no acute distress.  HEENT: Grossly normal.  Neck: Supple, no JVD, carotid bruits, or masses. Cardiac: irreg irreig, tachy, no murmurs, rubs, or gallops. No clubbing, cyanosis, edema.  Radials/DP/PT 2+ and equal bilaterally.  Respiratory:  Respirations regular and unlabored, clear to auscultation bilaterally. GI: Soft, nontender, nondistended, BS + x 4. MS: no deformity or atrophy. Skin: warm and dry, no rash. Groin site with mild hematoma  Neuro:  Strength and sensation are intact. Psych: AAOx3.  Normal affect.  Labs    CBC Recent Labs    11/13/17 0943 11/14/17 0441  WBC  --  10.6*  HGB 10.9* 11.2*  HCT 32.0* 34.7*  MCV  --  94.3  PLT  --  716   Basic Metabolic Panel Recent Labs    11/13/17 0943 11/14/17 0441  NA 142 139  K 4.1 3.9  CL 106 109  CO2  --  27  GLUCOSE 144* 104*  BUN 12 11  CREATININE 0.50 0.63  CALCIUM  --  8.2*  MG  --  1.8   Liver Function Tests No results for input(s): AST, ALT, ALKPHOS, BILITOT, PROT, ALBUMIN in the last 72 hours. No results for input(s): LIPASE, AMYLASE in the last 72 hours. Cardiac Enzymes No results for input(s):  CKTOTAL, CKMB, CKMBINDEX, TROPONINI in the last 72 hours. BNP Invalid input(s): POCBNP D-Dimer No results for input(s): DDIMER in the last 72 hours. Hemoglobin A1C No results for input(s): HGBA1C in the last 72 hours. Fasting Lipid Panel No results for input(s): CHOL, HDL, LDLCALC, TRIG, CHOLHDL, LDLDIRECT in the last 72 hours. Thyroid Function Tests No results for input(s): TSH, T4TOTAL, T3FREE, THYROIDAB in the last 72 hours.  Invalid input(s): FREET3  Telemetry    afib with RVR - Personally Reviewed  ECG    afib with RVR - Personally Reviewed  Radiology    Dg Chest Port 1 View  Result Date: 11/13/2017 CLINICAL DATA:  Status post aortic valve replacement. EXAM: PORTABLE CHEST 1 VIEW COMPARISON:  11/09/2017 FINDINGS: Interval placement of right internal jugular approach  central venous catheter with tip overlying the expected location of the distal superior vena cava aortic valve prosthesis noted. Cardiomediastinal silhouette is normal. Mediastinal contours appear intact. Tortuosity and calcific atherosclerotic disease of the aorta. There is no evidence of focal airspace consolidation, pleural effusion or pneumothorax. Osseous structures are without acute abnormality. Soft tissues are grossly normal. IMPRESSION: Post aortic valve replacement without radiographic evidence of complications. Tortuosity and calcific atherosclerotic disease of the aorta. Electronically Signed   By: Fidela Salisbury M.D.   On: 11/13/2017 11:33    Cardiac Studies   TAVR OPERATIVE NOTE   Date of Procedure:                11/13/2017  Preoperative Diagnosis:      Severe Aortic Stenosis  Procedure:        Transcatheter Aortic Valve Replacement - Percutaneous Right Transfemoral Approach             Edwards Sapien 3 THV (size 26 mm, model # 9600TFX, serial # [id number])              Co-Surgeons:                        Gaye Pollack, MD and Lauree Chandler, MD  Pre-operative Echo Findings: ? Severe aortic stenosis ? Normal left ventricular systolic function  Post-operative Echo Findings: ? No paravalvular leak ? Normal left ventricular systolic function  ______________  Echo 11/14/17: pending   Patient Profile     Mikayla [name] is a 77 y.o. female with a history of PAF on coumadin, HTN, chronic venous insufficiency and prior DVT who presented to [facility] on 11/13/17 for planned TAVR.   Assessment & Plan    Severe AS: s/p successful TAVR with a 26 mm Edwards Sapien 3 THV via the TF approach on 11/13/17. Post operative echo pending. Groin sites are stable. ECG with afib with RVR and LAFB. Continue on Aspirin. We will resume home coumadin at discharge.  Afib with RVR: she has a history of afib and converted to afib with RVR post operatively. She is asymptomatic. We will  resume her home Cardizem CD 240mg  daily and Lopressor 25mg  BID for rate control.  HTN: BP mildly elevated. Resume home meds as above.  Hx of DVT: continue SCDs and ambulation. Will resume Coumadin at discharge  Acute on chronic diastolic CHF: as evidenced by mildly elevated BNP and progressive symptoms of shortness of breath and fatigue on admission. This has been treated with TAVR. She will be resumed on her PRN lasix at discharge.   SignedAngelena Form, PA-C  11/14/2017, 10:01 AM  Pager [phone]  I have personally seen and examined this  patient. I agree with the assessment and plan as outlined above.  She is doing well one day post TAVR. Atrial fib with RVR but home meds held. Resume Cardizem and Lopressor. Echo pending.   Lauree Chandler 11/15/2017 5:00 AM

## 2017-11-14 NOTE — Progress Notes (Signed)
  Echocardiogram 2D Echocardiogram has been performed.  Mikayla [name] 11/14/2017, 10:41 AM

## 2017-11-14 NOTE — Progress Notes (Signed)
Central line removed, per order. Pressure applied. Vaseline gauze dressing applied. Vitals stable. Will continue to monitor.  Ara Kussmaul BSN, RN

## 2017-11-14 NOTE — Anesthesia Postprocedure Evaluation (Signed)
Anesthesia Post Note  Patient: Kindal Ponti  Procedure(s) Performed: TRANSCATHETER AORTIC VALVE REPLACEMENT, TRANSFEMORAL using a 98m Edwards Sapien 3 Aortic Valve (N/A Chest) INTRAOPERATIVE TRANSTHORACIC ECHOCARDIOGRAM (N/A Chest)     Patient location during evaluation: Nursing Unit Anesthesia Type: MAC Level of consciousness: awake and alert, oriented and patient cooperative Pain management: pain level controlled Vital Signs Assessment: post-procedure vital signs reviewed and stable Respiratory status: spontaneous breathing, nonlabored ventilation and respiratory function stable Cardiovascular status: blood pressure returned to baseline and stable Postop Assessment: no apparent nausea or vomiting, adequate PO intake and able to ambulate Anesthetic complications: no    Last Vitals:  Vitals:   11/14/17 1000 11/14/17 1100  BP: (!) 115/98 (!) 117/50  Pulse: 92 84  Resp: 20 14  Temp:    SpO2: 96% 96%    Last Pain:  Vitals:   11/14/17 1041  TempSrc:   PainSc: 0-No pain                 Harrold Fitchett,E. Allex Lapoint

## 2017-11-15 ENCOUNTER — Encounter (HOSPITAL_COMMUNITY): Payer: Self-pay | Admitting: Cardiovascular Disease

## 2017-11-15 ENCOUNTER — Ambulatory Visit: Payer: Medicare Other

## 2017-11-15 DIAGNOSIS — I350 Nonrheumatic aortic (valve) stenosis: Principal | ICD-10-CM

## 2017-11-15 DIAGNOSIS — Z952 Presence of prosthetic heart valve: Secondary | ICD-10-CM

## 2017-11-15 LAB — BASIC METABOLIC PANEL
Anion gap: 7 (ref 5–15)
BUN: 9 mg/dL (ref 8–23)
CALCIUM: 8.2 mg/dL — AB (ref 8.9–10.3)
CO2: 24 mmol/L (ref 22–32)
Chloride: 108 mmol/L (ref 98–111)
Creatinine, Ser: 0.56 mg/dL (ref 0.44–1.00)
GFR calc Af Amer: >60 mL/min (ref >60)
Glucose, Bld: 118 mg/dL — ABNORMAL HIGH (ref 70–99)
Potassium: 4.9 mmol/L (ref 3.5–5.1)
Sodium: 139 mmol/L (ref 135–145)

## 2017-11-15 LAB — CBC
HCT: 37.1 % (ref 36.0–46.0)
Hemoglobin: 12.2 g/dL (ref 12.0–15.0)
MCH: 30.4 pg (ref 26.0–34.0)
MCHC: 32.9 g/dL (ref 30.0–36.0)
MCV: 92.5 fL (ref 80.0–100.0)
PLATELETS: 144 10*3/uL — AB (ref 150–400)
RBC: 4.01 MIL/uL (ref 3.87–5.11)
RDW: 13.1 % (ref 11.5–15.5)
WBC: 11.6 10*3/uL — ABNORMAL HIGH (ref 4.0–10.5)
nRBC: 0 % (ref 0.0–0.2)

## 2017-11-15 MED ORDER — ASPIRIN 81 MG PO CHEW: 81.0000 mg | CHEWABLE_TABLET | Freq: Every day | ORAL | Status: DC

## 2017-11-15 NOTE — Discharge Summary (Addendum)
[facility] VALVE TEAM  Discharge Summary    Patient ID: Dannia Snook MRN: [id number]; DOB: Dec 04, 1940  Admit date: 11/13/2017 Discharge date: 11/15/2017  Primary Care Provider: Mosie Lukes, MD  Primary Cardiologist: Dr. Percival Spanish / Dr. Angelena Form & Dr. Cyndia Bent (TAVR)  Discharge Diagnoses    Principal Problem:   S/P TAVR (transcatheter aortic valve replacement) Active Problems:   Benign essential HTN   Chronic venous insufficiency   GERD (gastroesophageal reflux disease)   History of DVT (deep vein thrombosis)   Severe aortic stenosis   Hyperlipidemia   Macular degeneration   PAF (paroxysmal atrial fibrillation) (HCC)   Acute on chronic diastolic heart failure (HCC)   Allergies Allergies  Allergen Reactions  . Indomethacin Nausea And Vomiting and Other (See Comments)    Caused chest pain.  . Codeine Nausea And Vomiting    Diagnostic Studies/Procedures   TAVR OPERATIVE NOTE   Date of Procedure:11/13/2017  Preoperative Diagnosis:Severe Aortic Stenosis  Procedure:   Transcatheter Aortic Valve Replacement - PercutaneousRightTransfemoral Approach Edwards Sapien 3 THV (size 28mm, model # 9600TFX, serial # [id number])  Co-Surgeons:Bryan Alveria Apley, MD and Lauree Chandler, MD  Pre-operative Echo Findings: ? Severe aortic stenosis ? Normalleft ventricular systolic function  Post-operative Echo Findings: ? Noparavalvular leak ? Normalleft ventricular systolic function  ______________  Echo 11/14/17:  Study Conclusions - Left ventricle: The cavity size was normal. There was severe   focal basal and moderate concentric hypertrophy. Systolic   function was vigorous. The estimated ejection fraction was in the   range of 65% to 70%. Wall motion was normal; there were no   regional wall motion abnormalities. There was an increased   relative  contribution of atrial contraction to ventricular   filling. Doppler parameters are consistent with abnormal left   ventricular relaxation (grade 1 diastolic dysfunction). - Aortic valve: S/P 47mm Edwards Sapien bioprosthetic AVR which is   functioning normally with no perivalvular AI. Mean gradient (S):   13 mm Hg. Valve area (VTI): 1.43 cm^2. Valve area (Vmax): 1.44   cm^2. Valve area (Vmean): 1.38 cm^2. - Mitral valve: There was mild regurgitation. - Tricuspid valve: There was mild regurgitation. - Pulmonary arteries: Systolic pressure could not be accurately   estimated.   History of Present Illness     Shakeya Kerkman is a 77 y.o. female with a history of PAF on coumadin, chronic leg weakness, HTN, chronic venous insufficiency and prior DVT who presented to [facility] on 11/13/17 for planned TAVR.   Patient has had progressive exertional fatigue and shortness of breath as well as frequent episodes of dizziness. She had moderate AS by echo about one year ago with a mean gradient at that time of 34 mm Hg. Her most recent echo on 08/28/2017 showed an increase in the mean gradient to 46 mm Hg with a DI of 0.22. The LVEF was 60-65%. Cath 09/28/17 showed mild non-obstructive CAD with a 70% occl small caliber diagonal branch too small for PCI.   She was evaluated by the multidisciplinary valve team and felt to have severe, symptomatic AS with NHYA class III symptoms and a suitable candidate for TAVR, which was set up for 11/15/17.   Hospital Course     Consultants: none  Severe AS:s/p successful TAVR with a 26 mm Edwards Sapien 3 THV via the TF approach on 11/13/17. Post operative echo showed EF 65%, normally functioning TAVR with no PVL and mean gradient 13 mm Hg. Groin sites are  stable. ECG with no high grade block. Continue on Aspirin 81mg  daily and resume on home coumadin tonight.  Afib with RVR: she has a history of afib and converted to afib with RVR post operatively. This was asymptomatic. We  resumed her home Cardizem CD 240mg  daily and Lopressor 25mg  BID and she spontaneously converted to NSR. Will resume home coumadin tonight ( I have asked her to take 5mg  daily until seen in coumadin clinic). I have arranged for a coumadin clinic appointment next Monday with her PCP's office.   HTN: BP well controlled back on home meds.   Hx of DVT: start back on coumadin tonight  Acute on chronic diastolic CHF: as evidenced by mildly elevated BNP and progressive symptoms of shortness of breath and fatigue on admission. This has been treated with TAVR. She will be resumed on her PRN lasix at discharge.  _____________  Discharge Vitals Blood pressure (!) 131/55, pulse 75, temperature 97.9 F (36.6 C), temperature source Oral, resp. rate 16, height 5\' 5"  (1.651 m), weight 74.5 kg, SpO2 95 %.  Filed Weights   11/13/17 0612 11/14/17 0605 11/15/17 0500  Weight: 75.6 kg 75.5 kg 74.5 kg   VS:  BP (!) 131/55 (BP Location: Left Arm)   Pulse 75   Temp 97.9 F (36.6 C) (Oral)   Resp 16   Ht 5\' 5"  (1.651 m)   Wt 74.5 kg   SpO2 95%   BMI 27.32 kg/m    GEN: Well nourished, well developed, in no acute distress HEENT: normal Neck: no JVD or masses Cardiac: RRR; no murmurs, rubs, or gallops,no edema  Respiratory:  clear to auscultation bilaterally, normal work of breathing GI: soft, nontender, nondistended, + BS MS: no deformity or atrophy Skin: warm and dry, no rash Neuro:  Alert and Oriented x 3, Strength and sensation are intact Psych: euthymic mood, full affect   Labs & Radiologic Studies    CBC Recent Labs    11/14/17 0441 11/15/17 0300  WBC 10.6* 11.6*  HGB 11.2* 12.2  HCT 34.7* 37.1  MCV 94.3 92.5  PLT 155 454*   Basic Metabolic Panel Recent Labs    11/14/17 0441 11/15/17 0300  NA 139 139  K 3.9 4.9  CL 109 108  CO2 27 24  GLUCOSE 104* 118*  BUN 11 9  CREATININE 0.63 0.56  CALCIUM 8.2* 8.2*  MG 1.8  --    Liver Function Tests No results for input(s): AST, ALT,  ALKPHOS, BILITOT, PROT, ALBUMIN in the last 72 hours. No results for input(s): LIPASE, AMYLASE in the last 72 hours. Cardiac Enzymes No results for input(s): CKTOTAL, CKMB, CKMBINDEX, TROPONINI in the last 72 hours. BNP Invalid input(s): POCBNP D-Dimer No results for input(s): DDIMER in the last 72 hours. Hemoglobin A1C No results for input(s): HGBA1C in the last 72 hours. Fasting Lipid Panel No results for input(s): CHOL, HDL, LDLCALC, TRIG, CHOLHDL, LDLDIRECT in the last 72 hours. Thyroid Function Tests No results for input(s): TSH, T4TOTAL, T3FREE, THYROIDAB in the last 72 hours.  Invalid input(s): FREET3 _____________  Dg Chest 2 View  Result Date: 11/09/2017 CLINICAL DATA:  Preop aortic valve surgery.  Aortic stenosis. EXAM: CHEST - 2 VIEW COMPARISON:  CT chest 10/22/2017 FINDINGS: Heart size within normal limits. Tortuous thoracic aorta. Vascularity normal. Negative for heart failure infiltrate or effusion. Moderate thoracic scoliosis. IMPRESSION: No active cardiopulmonary disease. Electronically Signed   By: Franchot Gallo M.D.   On: 11/09/2017 17:07   Ct Coronary Morph  W/cta Cor W/score W/ca W/cm &/or Wo/cm  Addendum Date: 10/22/2017   ADDENDUM REPORT: 10/22/2017 11:15 CLINICAL DATA:  Aortic stenosis EXAM: Cardiac TAVR CT TECHNIQUE: The patient was scanned on a [employer] scanner. A 120 kV retrospective scan was triggered in the descending thoracic aorta at 111 HU's. Gantry rotation speed was 270 msecs and collimation was .9 mm. No beta blockade or nitro were given. The 3D data set was reconstructed in 5% intervals of the R-R cycle. Systolic and diastolic phases were analyzed on a dedicated work station using MPR, MIP and VRT modes. The patient received 80 cc of contrast. FINDINGS: Aortic Valve: Calcified tri leaflet with restricted motion Aorta: Moderate calcific atherosclerosis Sinotubular Junction: 30 mm Ascending Thoracic Aorta: 34 mm Aortic Arch: Not well seen Descending  Thoracic Aorta: 24 mm Sinus of Valsalva Measurements: Non-coronary: 30.8 mm Right - coronary: 29.1 mm Left - coronary: 31.8 mm Coronary Artery Height above Annulus: Left Main: 10.8 mm above annulus Right Coronary: 12.7 mm above annulus Virtual Basal Annulus Measurements: Maximum/Minimum Diameter: 21.7 mm x 28.3 mm Perimeter: 79.8 mm Area: 501.5 mm2 Coronary Arteries: Sufficient height above annulus for deployment Optimum Fluoroscopic Angle for Delivery: LAO 14 Caudal 14 degrees IMPRESSION: 1. Calcified tri leaflet AV with annular area of 501.5 mm2 suitable for a 26 mm Sapien 3 valve 2. Optimum angiographic angle for deployment LAO 14 Caudal 14 degrees 3.  Coronary arteries sufficient height above annulus for deployment 4.  Normal aortic root diameter 3.4 cm 5.  No LAA thrombus. Jenkins Rouge Electronically Signed   By: Jenkins Rouge M.D.   On: 10/22/2017 11:15   Result Date: 10/22/2017 EXAM: OVER-READ INTERPRETATION  CT CHEST The following report is an over-read performed by radiologist Dr. Vinnie Langton of [facility], [place] on 10/22/2017. This over-read does not include interpretation of cardiac or coronary anatomy or pathology. The coronary calcium score/coronary CTA interpretation by the cardiologist is attached. COMPARISON:  None. FINDINGS: Extracardiac findings will be described separately under dictation for contemporaneously obtained CTA chest, abdomen and pelvis. IMPRESSION: Please see separate dictation for contemporaneously obtained CTA chest, abdomen and pelvis dated 10/22/2017 for full description of relevant extracardiac findings. Electronically Signed: By: Vinnie Langton M.D. On: 10/22/2017 10:50   Dg Chest Port 1 View  Result Date: 11/13/2017 CLINICAL DATA:  Status post aortic valve replacement. EXAM: PORTABLE CHEST 1 VIEW COMPARISON:  11/09/2017 FINDINGS: Interval placement of right internal jugular approach central venous catheter with tip overlying the expected location of the distal  superior vena cava aortic valve prosthesis noted. Cardiomediastinal silhouette is normal. Mediastinal contours appear intact. Tortuosity and calcific atherosclerotic disease of the aorta. There is no evidence of focal airspace consolidation, pleural effusion or pneumothorax. Osseous structures are without acute abnormality. Soft tissues are grossly normal. IMPRESSION: Post aortic valve replacement without radiographic evidence of complications. Tortuosity and calcific atherosclerotic disease of the aorta. Electronically Signed   By: Fidela Salisbury M.D.   On: 11/13/2017 11:33   Ct Angio Chest Aorta W &/or Wo Contrast  Result Date: 10/22/2017 CLINICAL DATA:  77 year old female with history of severe aortic stenosis. Preprocedural study prior to potential transcatheter aortic valve replacement (TAVR) procedure. EXAM: CT ANGIOGRAPHY CHEST, ABDOMEN AND PELVIS TECHNIQUE: Multidetector CT imaging through the chest, abdomen and pelvis was performed using the standard protocol during bolus administration of intravenous contrast. Multiplanar reconstructed images and MIPs were obtained and reviewed to evaluate the vascular anatomy. CONTRAST:  146mL ISOVUE-370 IOPAMIDOL (ISOVUE-370) INJECTION 76% COMPARISON:  None. FINDINGS:  CTA CHEST FINDINGS Cardiovascular: Heart size is mildly enlarged. There is no significant pericardial fluid, thickening or pericardial calcification. There is aortic atherosclerosis, as well as atherosclerosis of the great vessels of the mediastinum and the coronary arteries, including calcified atherosclerotic plaque in the left main and left anterior descending coronary arteries. Severe thickening calcifications of the aortic valve. Mediastinum/Lymph Nodes: No pathologically enlarged mediastinal or hilar lymph nodes. Esophagus is unremarkable in appearance. No axillary lymphadenopathy. Lungs/Pleura: No suspicious pulmonary nodules or masses. No acute consolidative airspace disease. No pleural  effusions. Musculoskeletal/Soft Tissues: There are no aggressive appearing lytic or blastic lesions noted in the visualized portions of the skeleton. CTA ABDOMEN AND PELVIS FINDINGS Hepatobiliary: 9 mm low-attenuation lesion in segment 4A of the liver, too small to characterize, but statistically likely a cyst. 2.1 cm low-attenuation lesion in the periphery of segment 8 of the liver, compatible with a simple cyst. Status post cholecystectomy. Moderate to severe intrahepatic biliary ductal dilatation. Common hepatic duct measures up to 24 mm in diameter. Common bile duct is also dilated measuring 9 mm in the porta hepatis. On coronal image 69 of series 18 and axial image 111 of series 16 there is evidence of potential noncalcified stone in the distal common bile duct, however, this is not definitive. Pancreas: No pancreatic mass. No pancreatic ductal dilatation. No pancreatic or peripancreatic fluid or inflammatory changes. Spleen: Unremarkable. Adrenals/Urinary Tract: 2.8 cm low-attenuation lesion in the medial aspect of the interpolar region of the right kidney, compatible with a simple cyst. Left kidney and bilateral adrenal glands are normal in appearance. No hydroureteronephrosis. Urinary bladder is normal in appearance. Stomach/Bowel: Normal appearance of the stomach. No pathologic dilatation of small bowel or colon. A few scattered colonic diverticulae are noted, without surrounding inflammatory changes to suggest an acute diverticulitis at this time. Normal appendix. Vascular/Lymphatic: Aortic atherosclerosis, with vascular findings and measurements pertinent to potential TAVR procedure, as detailed below. No aneurysm or dissection noted in the abdominal or pelvic vasculature. No lymphadenopathy noted in the abdomen or pelvis. Reproductive: Status post hysterectomy. Ovaries are not confidently identified may be surgically absent or atrophic. Other: No significant volume of ascites.  No pneumoperitoneum.  Musculoskeletal: Status post left hip arthroplasty. There are no aggressive appearing lytic or blastic lesions noted in the visualized portions of the skeleton. VASCULAR MEASUREMENTS PERTINENT TO TAVR: AORTA: Minimal Aortic Diameter-15 x 13 mm Severity of Aortic Calcification-moderate to severe RIGHT PELVIS: Right Common Iliac Artery - Minimal Diameter-8.5 x 7.5 mm Tortuosity-moderate Calcification-moderate Right External Iliac Artery - Minimal Diameter-6.7 x 6.8 mm Tortuosity-moderate Calcification-none Right Common Femoral Artery - Minimal Diameter-6.6 x 7.3 mm Tortuosity-mild Calcification-mild LEFT PELVIS: Left Common Iliac Artery - Minimal Diameter-8.2 x 7.2 mm Tortuosity-severe Calcification-mild-to-moderate Left External Iliac Artery - Minimal Diameter-6.1 x 6.5 mm Tortuosity-moderate Calcification-none Left Common Femoral Artery - Minimal Diameter-6.3 x 7.9 mm Tortuosity-mild Calcification-mild Review of the MIP images confirms the above findings. IMPRESSION: 1. Vascular findings and measurements pertinent to potential TAVR procedure, as detailed above. 2. Severe thickening calcification of the aortic valve, compatible with the reported clinical history of severe aortic stenosis. 3. Aortic atherosclerosis, in addition to left main and left anterior descending coronary artery disease. Assessment for potential risk factor modification, dietary therapy or pharmacologic therapy may be warranted, if clinically indicated. 4. Status post cholecystectomy with extensive biliary tract dilatation and possible noncalcified choledocholithiasis in the distal common bile duct. If there is any clinical concern or biochemical evidence of biliary tract obstruction, further evaluation with nonemergent  MRI of the abdomen with and without IV gadolinium with MRCP should be considered in the near future. 5. Colonic diverticulosis without evidence of acute diverticulitis at this time. 6. Additional incidental findings, as above.  Electronically Signed   By: Vinnie Langton M.D.   On: 10/22/2017 11:48   Ct Angio Abdomen Pelvis  W &/or Wo Contrast  Result Date: 10/22/2017 CLINICAL DATA:  77 year old female with history of severe aortic stenosis. Preprocedural study prior to potential transcatheter aortic valve replacement (TAVR) procedure. EXAM: CT ANGIOGRAPHY CHEST, ABDOMEN AND PELVIS TECHNIQUE: Multidetector CT imaging through the chest, abdomen and pelvis was performed using the standard protocol during bolus administration of intravenous contrast. Multiplanar reconstructed images and MIPs were obtained and reviewed to evaluate the vascular anatomy. CONTRAST:  1100mL ISOVUE-370 IOPAMIDOL (ISOVUE-370) INJECTION 76% COMPARISON:  None. FINDINGS: CTA CHEST FINDINGS Cardiovascular: Heart size is mildly enlarged. There is no significant pericardial fluid, thickening or pericardial calcification. There is aortic atherosclerosis, as well as atherosclerosis of the great vessels of the mediastinum and the coronary arteries, including calcified atherosclerotic plaque in the left main and left anterior descending coronary arteries. Severe thickening calcifications of the aortic valve. Mediastinum/Lymph Nodes: No pathologically enlarged mediastinal or hilar lymph nodes. Esophagus is unremarkable in appearance. No axillary lymphadenopathy. Lungs/Pleura: No suspicious pulmonary nodules or masses. No acute consolidative airspace disease. No pleural effusions. Musculoskeletal/Soft Tissues: There are no aggressive appearing lytic or blastic lesions noted in the visualized portions of the skeleton. CTA ABDOMEN AND PELVIS FINDINGS Hepatobiliary: 9 mm low-attenuation lesion in segment 4A of the liver, too small to characterize, but statistically likely a cyst. 2.1 cm low-attenuation lesion in the periphery of segment 8 of the liver, compatible with a simple cyst. Status post cholecystectomy. Moderate to severe intrahepatic biliary ductal dilatation. Common  hepatic duct measures up to 24 mm in diameter. Common bile duct is also dilated measuring 9 mm in the porta hepatis. On coronal image 69 of series 18 and axial image 111 of series 16 there is evidence of potential noncalcified stone in the distal common bile duct, however, this is not definitive. Pancreas: No pancreatic mass. No pancreatic ductal dilatation. No pancreatic or peripancreatic fluid or inflammatory changes. Spleen: Unremarkable. Adrenals/Urinary Tract: 2.8 cm low-attenuation lesion in the medial aspect of the interpolar region of the right kidney, compatible with a simple cyst. Left kidney and bilateral adrenal glands are normal in appearance. No hydroureteronephrosis. Urinary bladder is normal in appearance. Stomach/Bowel: Normal appearance of the stomach. No pathologic dilatation of small bowel or colon. A few scattered colonic diverticulae are noted, without surrounding inflammatory changes to suggest an acute diverticulitis at this time. Normal appendix. Vascular/Lymphatic: Aortic atherosclerosis, with vascular findings and measurements pertinent to potential TAVR procedure, as detailed below. No aneurysm or dissection noted in the abdominal or pelvic vasculature. No lymphadenopathy noted in the abdomen or pelvis. Reproductive: Status post hysterectomy. Ovaries are not confidently identified may be surgically absent or atrophic. Other: No significant volume of ascites.  No pneumoperitoneum. Musculoskeletal: Status post left hip arthroplasty. There are no aggressive appearing lytic or blastic lesions noted in the visualized portions of the skeleton. VASCULAR MEASUREMENTS PERTINENT TO TAVR: AORTA: Minimal Aortic Diameter-15 x 13 mm Severity of Aortic Calcification-moderate to severe RIGHT PELVIS: Right Common Iliac Artery - Minimal Diameter-8.5 x 7.5 mm Tortuosity-moderate Calcification-moderate Right External Iliac Artery - Minimal Diameter-6.7 x 6.8 mm Tortuosity-moderate Calcification-none Right  Common Femoral Artery - Minimal Diameter-6.6 x 7.3 mm Tortuosity-mild Calcification-mild LEFT PELVIS: Left Common Iliac  Artery - Minimal Diameter-8.2 x 7.2 mm Tortuosity-severe Calcification-mild-to-moderate Left External Iliac Artery - Minimal Diameter-6.1 x 6.5 mm Tortuosity-moderate Calcification-none Left Common Femoral Artery - Minimal Diameter-6.3 x 7.9 mm Tortuosity-mild Calcification-mild Review of the MIP images confirms the above findings. IMPRESSION: 1. Vascular findings and measurements pertinent to potential TAVR procedure, as detailed above. 2. Severe thickening calcification of the aortic valve, compatible with the reported clinical history of severe aortic stenosis. 3. Aortic atherosclerosis, in addition to left main and left anterior descending coronary artery disease. Assessment for potential risk factor modification, dietary therapy or pharmacologic therapy may be warranted, if clinically indicated. 4. Status post cholecystectomy with extensive biliary tract dilatation and possible noncalcified choledocholithiasis in the distal common bile duct. If there is any clinical concern or biochemical evidence of biliary tract obstruction, further evaluation with nonemergent MRI of the abdomen with and without IV gadolinium with MRCP should be considered in the near future. 5. Colonic diverticulosis without evidence of acute diverticulitis at this time. 6. Additional incidental findings, as above. Electronically Signed   By: Vinnie Langton M.D.   On: 10/22/2017 11:48   Disposition   Pt is being discharged home today in good condition.  Follow-up Plans & Appointments    Follow-up Information    Eileen Stanford, PA-C. Go on 11/21/2017.   Specialties:  Cardiology, Radiology Why:  @ 2:30pm, please arrive at least 10 minutes early Contact information: [address] [place] [address] [phone]        Mosie Lukes, MD. Go on 11/20/2017.   Specialty:  Family  Medicine Why:  @ 9am for an INR check ( arrive at 8:45am)  Contact information: [address] [place] [address] [phone]          Discharge Instructions    Amb Referral to Cardiac Rehabilitation   Complete by:  As directed    Referring to [facility] Phase 2   Diagnosis:  Valve Replacement   Valve:  Aortic Comment - TAVR      Discharge Medications   Allergies as of 11/15/2017      Reactions   Indomethacin Nausea And Vomiting, Other (See Comments)   Caused chest pain.   Codeine Nausea And Vomiting      Medication List    STOP taking these medications   ciprofloxacin 500 MG tablet Commonly known as:  CIPRO     TAKE these medications   acetaminophen 500 MG tablet Commonly known as:  TYLENOL Take 500 mg by mouth every 8 (eight) hours as needed for mild pain or headache.   aspirin 81 MG chewable tablet Chew 1 tablet (81 mg total) by mouth daily.   BENEFIBER PO Take 1 Dose by mouth daily.   CALCIUM-MAGNESIUM-ZINC-D3 PO Take 2 tablets by mouth daily.   CARTIA XT 240 MG 24 hr capsule Generic drug:  diltiazem TAKE 1 CAPSULE BY MOUTH DAILY What changed:  how much to take   [place] Take 1 capsule by mouth every evening.   famciclovir 500 MG tablet Commonly known as:  FAMVIR TAKE ONE TABLET BY MOUTH TWO TIMES A DAY FOR 7 DAYS What changed:    how much to take  how to take this  when to take this  reasons to take this  additional instructions   Fish Oil 1200 MG Caps Take 1,200 mg by mouth every evening. W/ Omega-3 360 mg   furosemide 20 MG tablet Commonly known as:  LASIX TAKE ONE TABLET BY MOUTH DAILY AS NEEDED WEIGHT GAIN > 3LB IN 24 HOURS  OR INCREASED EDEMA What changed:    how much to take  how to take this  when to take this  reasons to take this   gabapentin 800 MG tablet Commonly known as:  NEURONTIN Take 1 tablet (800 mg total) by mouth 2 (two) times daily.   hyoscyamine 0.125 MG  SL tablet Commonly known as:  LEVSIN SL Place 1 tablet (0.125 mg total) under the tongue every 4 (four) hours as needed. What changed:  reasons to take this   metoprolol tartrate 25 MG tablet Commonly known as:  LOPRESSOR TAKE 1 TABLET BY MOUTH 2 TIMES DAILY   multivitamin tablet Take 1 tablet by mouth every evening.   multivitamin-lutein Caps capsule Take 1 capsule by mouth every evening.   omeprazole 40 MG capsule Commonly known as:  PRILOSEC TAKE ONE CAPSULE BY MOUTH ONCE DAILY What changed:  when to take this   SYSTANE OP Place 1 drop into both eyes 2 (two) times daily. For dry eyes   traMADol 50 MG tablet Commonly known as:  ULTRAM Take 1 tablet (50 mg total) by mouth every 12 (twelve) hours as needed for moderate pain.   Vitamin D3 1000 units Caps Take 1,000 Units by mouth daily.   warfarin 5 MG tablet Commonly known as:  COUMADIN Take as directed. If you are unsure how to take this medication, talk to your nurse or doctor. Original instructions:  TAKE DAILY AS DIRECTED BY COUMADIN CLINIC What changed:  See the new instructions. Notes to patient:  Please take 5mg  daily until seen by coumadin clinic next Monday.            Outstanding Labs/Studies   INR  Duration of Discharge Encounter   Greater than 30 minutes including physician time.  Mable Fill, PA-C 11/15/2017, 9:14 AM [phone]  I have personally seen and examined this patient. I agree with the assessment and plan as outlined above.  She is doing well two days post TAVR. Valve working well by echo. No significant PVL. BP stable. Sinus this am. Discharge home today on coumadin. ASA added.   Lauree Chandler 11/15/2017 11:09 AM

## 2017-11-15 NOTE — Progress Notes (Signed)
CARDIAC REHAB PHASE I   Offered to walk with pt, pt about to get an EKG. Reviewed d/c instructions with pt and daughter. Encouraged mobility. Pt stating her breathing feels better. Pt and daughter deny questions or concerns.   [phone] Rufina Falco, RN BSN 11/15/2017 10:03 AM

## 2017-11-15 NOTE — Plan of Care (Signed)
  Problem: Education: Goal: Knowledge of General Education information will improve Description Including pain rating scale, medication(s)/side effects and non-pharmacologic comfort measures Outcome: Progressing   Problem: Health Behavior/Discharge Planning: Goal: Ability to manage health-related needs will improve Outcome: Progressing   Problem: Clinical Measurements: Goal: Will remain free from infection Outcome: Progressing Goal: Cardiovascular complication will be avoided Outcome: Progressing   Problem: Safety: Goal: Ability to remain free from injury will improve Outcome: Progressing

## 2017-11-15 NOTE — Care Management Note (Signed)
Case Management Note Marvetta Gibbons RN, BSN Transitions of Care Unit 4E- RN Case Manager [phone]  Patient Details  Name: Gilberto Streck MRN: [id number] Date of Birth: [birth year]  Subjective/Objective:   Pt admitted s/p TAVR                 Action/Plan: PTA pt lived at home- plan to return home, no CM needs noted for transition home.  Expected Discharge Date:  11/15/17               Expected Discharge Plan:  Home/Self Care  In-House Referral:  NA  Discharge planning Services  CM Consult  Post Acute Care Choice:  NA Choice offered to:  NA  DME Arranged:    DME Agency:     HH Arranged:    HH Agency:     Status of Service:  Completed, signed off  If discussed at [place] of Stay Meetings, dates discussed:    Discharge Disposition: home/self care   Additional Comments:  Dawayne Patricia, RN 11/15/2017, 11:07 AM

## 2017-11-15 NOTE — Progress Notes (Signed)
Patient stated that, "she feels that her breathing is not right". Oxygen sats 97-100% RA. Lungs clear/diminished. Patient not in acute distress. Asked the patient if she was feeling anxious and she stated that, "maybe she was a little anxious about going home". Patient also stated that she does not usually get anxious. Offered oxygen for comfort but patient declined. Will continue to monitor

## 2017-11-16 ENCOUNTER — Telehealth: Payer: Self-pay

## 2017-11-16 NOTE — Consult Note (Signed)
            [facility] CM Primary Care Navigator  11/16/2017  Pari Lombard 16-Jul-1940 [id number]   Went to seepatient at the bedside to identify possible discharge needsbut she was alreadydischargedhomeper staff.  Per MD note,patienthas had progressive exertional fatigue, shortness of breath and frequent episodes of dizziness (severe aortic stenosis underwent TAVR- transcatheter aortic valve replacement, atrial fibrillation with rapid ventricular response, acute on chronic diastolic congestive HF).  Primary care provider's office is listed as providing transition of care (TOC) follow-up.   Patient has discharge instruction to follow-up withprimary care provider on 11/20/17 andcardiologyfollow-up on 11/21/17.  Primary care provider's office called Lovena Le for [facility].) to notifyofpatient's discharge, need for post hospital follow-up and transition of care(TOC). Notified ofpatient'shealth issues needing close follow-up and made aware to refer patient to [facility] care management if deemed necessary and appropriate for any services.   For additional questions please contact:  Edwena Felty A. Peaches Vanoverbeke, BSN, RN-BC [facility] PRIMARY CARE Navigator Cell: [phone]

## 2017-11-20 ENCOUNTER — Ambulatory Visit (INDEPENDENT_AMBULATORY_CARE_PROVIDER_SITE_OTHER): Payer: Medicare Other

## 2017-11-20 ENCOUNTER — Other Ambulatory Visit: Payer: Medicare Other

## 2017-11-20 DIAGNOSIS — Z86718 Personal history of other venous thrombosis and embolism: Secondary | ICD-10-CM

## 2017-11-20 LAB — POCT INR: INR: 1.3 — AB (ref 2.0–3.0)

## 2017-11-20 NOTE — Progress Notes (Addendum)
.  Pre visit review using our clinic tool,if applicable. No additional management support is needed unless otherwise documented below in the visit note.   Pt here for INR check per Hospital discharge order.  Goal INR = 2.0-3.0  Last INR = 1.16  Pt currently takes Coumadin 5 mg daily  Pt denies recent antibiotics, no dietary changes and no unusual bruising / bleeding.  INR today = 1.3  Pt advised per Dr. Charlett Blake increase Coumadin to 7.5 mg on Tuesday and Saturday and 5 mg every other day. Repeat INR in 1 week. Appointment scheduled.  PATIENT STATES SHE WENT TO [place] UP.
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
## 2017-11-21 ENCOUNTER — Ambulatory Visit (INDEPENDENT_AMBULATORY_CARE_PROVIDER_SITE_OTHER): Payer: Medicare Other | Admitting: Physician Assistant

## 2017-11-21 VITALS — BP 144/62 | HR 65 | Ht 65.0 in | Wt 167.8 lb

## 2017-11-21 DIAGNOSIS — Z952 Presence of prosthetic heart valve: Secondary | ICD-10-CM

## 2017-11-21 DIAGNOSIS — Z86718 Personal history of other venous thrombosis and embolism: Secondary | ICD-10-CM | POA: Diagnosis not present

## 2017-11-21 DIAGNOSIS — I10 Essential (primary) hypertension: Secondary | ICD-10-CM | POA: Diagnosis not present

## 2017-11-21 DIAGNOSIS — I480 Paroxysmal atrial fibrillation: Secondary | ICD-10-CM | POA: Diagnosis not present

## 2017-11-21 DIAGNOSIS — I5032 Chronic diastolic (congestive) heart failure: Secondary | ICD-10-CM

## 2017-11-21 NOTE — Patient Instructions (Addendum)
Medication Instructions:  Your physician recommends that you continue on your current medications as directed. Please refer to the Current Medication list given to you today.  Labwork: None  Testing/Procedures: No new orders.  Follow-Up: Please keep your appointments for your ultrasound and office visit with Nell Range on 12/20/17. Please arrive by 1:15 PM.  Any Other Special Instructions Will Be Listed Below (If Applicable).     If you need a refill on your cardiac medications before your next appointment, please call your pharmacy.

## 2017-11-28 ENCOUNTER — Telehealth: Payer: Self-pay

## 2017-11-28 ENCOUNTER — Ambulatory Visit (INDEPENDENT_AMBULATORY_CARE_PROVIDER_SITE_OTHER): Payer: Medicare Other

## 2017-11-28 DIAGNOSIS — Z86718 Personal history of other venous thrombosis and embolism: Secondary | ICD-10-CM | POA: Diagnosis not present

## 2017-11-28 LAB — POCT INR: INR: 3.1 — AB (ref 2.0–3.0)

## 2017-11-28 NOTE — Progress Notes (Addendum)
Pre visit review using our clinic tool,if applicable. No additional management support is needed unless otherwise documented below in the visit note.   Pt here for INR check per Dr. Penni Homans.  Goal INR = 2.0-3.0  Last INR = 1.3  Pt currently takes Coumadin 5 mg all days except 7.5 mg on Tuesday and Saturday.  Pt denies recent antibiotics, no dietary changes and no unusual bruising / bleeding.  INR today = 3.1  Pt advised per Dr. Larose Kells to continue same dose of Coumadin and return for INR check in 10 days. Appointment scheduled.   Kathlene November, MD

## 2017-11-28 NOTE — Telephone Encounter (Signed)
Copied from [place] [id number]. Topic: Appointment Scheduling - Scheduling Inquiry for Clinic >> Nov 16, 2017 11:57 AM Conception Chancy, NT wrote: Reason for CRM: [name] is calling from [facility] and states the patient was released from the hospital on 11/15/17 and is needing to schedule a hospital follow up.

## 2017-11-28 NOTE — Telephone Encounter (Signed)
Patient states she will follow up with Cardiology. Dr. Charlett Blake notified.

## 2017-12-03 ENCOUNTER — Telehealth: Payer: Self-pay | Admitting: Physician Assistant

## 2017-12-03 NOTE — Telephone Encounter (Signed)
Mikayla [name] is concerned that if her upcoming echo and office visit run late she will not be safe to drive as she has trouble seeing in the dark.  Reiterated to her that her OV is at 1330 and her echo is at 1400 and should be completed by 1500 for her to safely get home.  Offered to reschedule echo to the morning, but she states she will be OK. She understands to call prior to that appointment to move if she changes her mind. She was grateful for call.

## 2017-12-03 NOTE — Telephone Encounter (Signed)
New Message   Pt calling, states she needs to discuss the upcoming appts she has with Dr. Grandville Silos. Please call

## 2017-12-04 ENCOUNTER — Telehealth: Payer: Self-pay | Admitting: Cardiology

## 2017-12-04 NOTE — Telephone Encounter (Signed)
  Received a call from [facility] regarding her setting up Cardiac rehab and the patient stated no one has talked to her about this and she doesn't understand why they called.

## 2017-12-04 NOTE — Telephone Encounter (Signed)
Called patient, she states that she received a call to set up for cardiac rehab through [place]. I advised patient it looks like they scheduled her after her TAVR, patient wanted to know why did not notify her, I advised that it was a different office and I was unsure, but I could send a message over if she would like, she stated not too that she would call back and schedule the cardiac rehab.   Patient was thankful for the call.

## 2017-12-10 ENCOUNTER — Other Ambulatory Visit: Payer: Self-pay | Admitting: Family Medicine

## 2017-12-11 ENCOUNTER — Ambulatory Visit (INDEPENDENT_AMBULATORY_CARE_PROVIDER_SITE_OTHER): Payer: Medicare Other

## 2017-12-11 ENCOUNTER — Telehealth: Payer: Self-pay | Admitting: Physician Assistant

## 2017-12-11 DIAGNOSIS — Z7901 Long term (current) use of anticoagulants: Secondary | ICD-10-CM

## 2017-12-11 DIAGNOSIS — Z86718 Personal history of other venous thrombosis and embolism: Secondary | ICD-10-CM

## 2017-12-11 LAB — POCT INR: INR: 6 — AB (ref 2.0–3.0)

## 2017-12-11 NOTE — Telephone Encounter (Signed)
New Message   Pt c/o medication issue:  1. Name of Medication: aspirin 81 MG chewable tablet  2. How are you currently taking this medication (dosage and times per day)? Chew 1 tablet (81 mg total) by mouth daily  3. Are you having a reaction (difficulty breathing--STAT)? no  4. What is your medication issue? Pt states that her  pcp is trying to take her off Aspirin but she wants to check with her cardiologist. Please call

## 2017-12-11 NOTE — Telephone Encounter (Signed)
Pt called to report that she had an INR today with Dr. Azalee Course office.. They have been managing her INR's.. Last week it was 3.1 and today it is 6.0.. She was advised by Dr. Azalee Course office to hold her coumadin and her ASA for the next 2 days and to return on Thursday 12/13/17 for a repeat INR.Marland Kitchen Pt was uncomfortable holding her ASA, after talking with Fuller Canada RPH.. Pt advised that we do not recommend holding her ASA but agree with holding her Coumadin. She was strongly urged to keep her f/u appt with them on 11/7.Marland Kitchen Pt agreed and will forward message to Dr. Randel Pigg and Dr. Angelena Form.

## 2017-12-11 NOTE — Progress Notes (Signed)
Pre visit review using our clinic tool,if applicable. No additional management support is needed unless otherwise documented below in the visit note.    Pt here for INR check per order from Dr. Charlett Blake.  Goal INR = 2.0-3.0  Last INR = 3.1  Pt currently takes Coumadin 5 mg all days except 7.5 mg on Tuesday and Saturday.  Pt denies recent antibiotics, no dietary changes and no unusual bruising / bleeding. Patient states she has been taking Aspirin 81 mg daily   INR today = 6.0  Pt advised per Dr. Charlett Blake Stop Aspirin 81 mg. Hold Coumadin  X 2 days and return on Thursday for INR check. Patient agreed. Appointment scheduled.

## 2017-12-11 NOTE — Telephone Encounter (Signed)
Thanks

## 2017-12-11 NOTE — Telephone Encounter (Signed)
Called patient per Dr. Randel Pigg informed her that she needs to STOP Aspirin 81 mg. Patient states she will stop until she sees Dr. Angelena Form.and discusses with him. Patient will return on Thursday for re-check of INR.

## 2017-12-13 ENCOUNTER — Ambulatory Visit (INDEPENDENT_AMBULATORY_CARE_PROVIDER_SITE_OTHER): Payer: Medicare Other

## 2017-12-13 DIAGNOSIS — Z86718 Personal history of other venous thrombosis and embolism: Secondary | ICD-10-CM

## 2017-12-13 LAB — POCT INR: INR: 5.2 — AB (ref 2.0–3.0)

## 2017-12-13 NOTE — Progress Notes (Signed)
Pre visit review using our clinic tool,if applicable. No additional management support is needed unless otherwise documented below in the visit note.   Pt here for INR check per Dr. Charlett Blake  Goal INR = 2.0-3.0  Last INR = 6.0  Pt currently takes Coumadin  Medication on hold per Dr. Charlett Blake Patient states she still takes Aspirin 81 mg per her surgeon.  Pt denies recent antibiotics, no dietary changes and no unusual bruising / bleeding.  INR today = 5.2  Pt advised per Dr. Charlett Blake to continue holding Coumadin except for Sunday take 5 mg and return to office on Monday for INR re-check. Patient states she has another appointment on Monday and scheduled for Tuesday for INR check.

## 2017-12-17 DIAGNOSIS — H43393 Other vitreous opacities, bilateral: Secondary | ICD-10-CM | POA: Diagnosis not present

## 2017-12-17 DIAGNOSIS — H5201 Hypermetropia, right eye: Secondary | ICD-10-CM | POA: Diagnosis not present

## 2017-12-17 DIAGNOSIS — H0288A Meibomian gland dysfunction right eye, upper and lower eyelids: Secondary | ICD-10-CM | POA: Diagnosis not present

## 2017-12-17 DIAGNOSIS — H02834 Dermatochalasis of left upper eyelid: Secondary | ICD-10-CM | POA: Diagnosis not present

## 2017-12-17 DIAGNOSIS — H353131 Nonexudative age-related macular degeneration, bilateral, early dry stage: Secondary | ICD-10-CM | POA: Diagnosis not present

## 2017-12-17 DIAGNOSIS — H0288B Meibomian gland dysfunction left eye, upper and lower eyelids: Secondary | ICD-10-CM | POA: Diagnosis not present

## 2017-12-17 DIAGNOSIS — H04123 Dry eye syndrome of bilateral lacrimal glands: Secondary | ICD-10-CM | POA: Diagnosis not present

## 2017-12-17 DIAGNOSIS — H02831 Dermatochalasis of right upper eyelid: Secondary | ICD-10-CM | POA: Diagnosis not present

## 2017-12-17 DIAGNOSIS — H47093 Other disorders of optic nerve, not elsewhere classified, bilateral: Secondary | ICD-10-CM | POA: Diagnosis not present

## 2017-12-17 DIAGNOSIS — Z961 Presence of intraocular lens: Secondary | ICD-10-CM | POA: Diagnosis not present

## 2017-12-17 DIAGNOSIS — H35363 Drusen (degenerative) of macula, bilateral: Secondary | ICD-10-CM | POA: Diagnosis not present

## 2017-12-17 DIAGNOSIS — H524 Presbyopia: Secondary | ICD-10-CM | POA: Diagnosis not present

## 2017-12-18 ENCOUNTER — Other Ambulatory Visit (INDEPENDENT_AMBULATORY_CARE_PROVIDER_SITE_OTHER): Payer: Medicare Other

## 2017-12-18 ENCOUNTER — Other Ambulatory Visit: Payer: Self-pay | Admitting: Family Medicine

## 2017-12-18 ENCOUNTER — Ambulatory Visit (INDEPENDENT_AMBULATORY_CARE_PROVIDER_SITE_OTHER): Payer: Medicare Other

## 2017-12-18 DIAGNOSIS — R110 Nausea: Secondary | ICD-10-CM

## 2017-12-18 DIAGNOSIS — R6883 Chills (without fever): Secondary | ICD-10-CM

## 2017-12-18 DIAGNOSIS — Z86718 Personal history of other venous thrombosis and embolism: Secondary | ICD-10-CM

## 2017-12-18 DIAGNOSIS — R8290 Unspecified abnormal findings in urine: Secondary | ICD-10-CM

## 2017-12-18 DIAGNOSIS — Z7901 Long term (current) use of anticoagulants: Secondary | ICD-10-CM

## 2017-12-18 LAB — URINALYSIS, ROUTINE W REFLEX MICROSCOPIC
Bilirubin Urine: NEGATIVE
KETONES UR: NEGATIVE
Nitrite: POSITIVE — AB
PH: 5.5 (ref 5.0–8.0)
SPECIFIC GRAVITY, URINE: 1.015 (ref 1.000–1.030)
Total Protein, Urine: NEGATIVE
URINE GLUCOSE: NEGATIVE
UROBILINOGEN UA: 0.2 (ref 0.0–1.0)

## 2017-12-18 LAB — POCT INR: INR: 1.2 — AB (ref 2.0–3.0)

## 2017-12-18 MED ORDER — CEFDINIR 300 MG PO CAPS: 300.0000 mg | ORAL_CAPSULE | Freq: Two times a day (BID) | ORAL | 0 refills | Status: DC
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
## 2017-12-18 NOTE — Patient Instructions (Signed)
Per Dr. Charlett Blake take 7.5 mg today and tomorrow then resume taking Coumadin 5 mg all days except Tuesday and Saturday take 7.5 mg.  Return for INR check in 2 weeks.

## 2017-12-18 NOTE — Progress Notes (Addendum)
/  Pre visit review using our clinic tool,if applicable. No additional management support is needed unless otherwise documented below in the visit note.   Pt here for INR check per order from Dr. Frederik Pear.   Goal INR = 2.0-3.0  Last INR = 5.2  Pt currently takes:  Coumadin 5 mg taken on Sunday and held all other days per Dr. Charlett Blake.  Pt denies recent antibiotics, no dietary changes and no unusual bruising / bleeding.   INR today = 1.2  Per Dr. Charlett Blake patient to take 7.5 mg today and tomorrow then resume Coumadin 5 mg all days except Tuesday and  Saturday take 7.5 mg.   Return for INR check in 1 week.

## 2017-12-19 ENCOUNTER — Other Ambulatory Visit (HOSPITAL_COMMUNITY): Payer: Medicare Other

## 2017-12-19 ENCOUNTER — Telehealth: Payer: Self-pay | Admitting: Family Medicine

## 2017-12-19 ENCOUNTER — Ambulatory Visit: Payer: Medicare Other | Admitting: Physician Assistant

## 2017-12-19 NOTE — Telephone Encounter (Signed)
Copied from [place] [id number]. Topic: Quick Communication - See Telephone Encounter >> Dec 19, 2017  9:07 AM Bea Graff, NT wrote: CRM for notification. See Telephone encounter for: 12/19/17. [place] calling to get clarification on the cefdinir (OMNICEF) 300 MG capsule. They state that the quantity and directions will not match up. She wants to know if the quantity needs to be increased for the pt to take the medicine 2 times a day for 10 days or if the pt is only taking the medicine for 5 days. Please advise.

## 2017-12-20 ENCOUNTER — Other Ambulatory Visit: Payer: Self-pay

## 2017-12-20 ENCOUNTER — Encounter: Payer: Self-pay | Admitting: Physician Assistant

## 2017-12-20 ENCOUNTER — Other Ambulatory Visit: Payer: Self-pay | Admitting: Physician Assistant

## 2017-12-20 ENCOUNTER — Ambulatory Visit (HOSPITAL_COMMUNITY): Payer: Medicare Other | Attending: Internal Medicine

## 2017-12-20 ENCOUNTER — Telehealth: Payer: Self-pay | Admitting: Family Medicine

## 2017-12-20 ENCOUNTER — Ambulatory Visit (INDEPENDENT_AMBULATORY_CARE_PROVIDER_SITE_OTHER): Payer: Medicare Other | Admitting: Physician Assistant

## 2017-12-20 ENCOUNTER — Ambulatory Visit (INDEPENDENT_AMBULATORY_CARE_PROVIDER_SITE_OTHER): Payer: Medicare Other

## 2017-12-20 VITALS — BP 130/70 | HR 69 | Ht 65.0 in | Wt 168.0 lb

## 2017-12-20 DIAGNOSIS — I10 Essential (primary) hypertension: Secondary | ICD-10-CM

## 2017-12-20 DIAGNOSIS — I5032 Chronic diastolic (congestive) heart failure: Secondary | ICD-10-CM

## 2017-12-20 DIAGNOSIS — Z952 Presence of prosthetic heart valve: Secondary | ICD-10-CM

## 2017-12-20 DIAGNOSIS — Z86718 Personal history of other venous thrombosis and embolism: Secondary | ICD-10-CM

## 2017-12-20 DIAGNOSIS — I480 Paroxysmal atrial fibrillation: Secondary | ICD-10-CM

## 2017-12-20 DIAGNOSIS — K838 Other specified diseases of biliary tract: Secondary | ICD-10-CM | POA: Diagnosis not present

## 2017-12-20 DIAGNOSIS — R002 Palpitations: Secondary | ICD-10-CM | POA: Diagnosis not present

## 2017-12-20 LAB — URINE CULTURE
MICRO NUMBER:: 91361117
SPECIMEN QUALITY:: ADEQUATE

## 2017-12-20 NOTE — Telephone Encounter (Signed)
Copied from [place] [id number]. Topic: General - Other >> Dec 20, 2017  7:40 AM Keene Breath wrote: Reason for CRM: Patient called to get clarification on her dosage for her medication cefdinir (OMNICEF) 300 MG capsule.  Patient stated that the doctor told her to take 1 pill 2x day for 10 days, which would be 20 pills.  Patient only received 10 pills in the bottle.  Therefore, patient wants to know if she should take 2 pills a day for 5 days and then another script would be sent in.  Please advise and call patient back to confirm.  Patient will be at this number until noon today, after that please leave a detailed message.  CB# [phone].

## 2017-12-20 NOTE — Telephone Encounter (Signed)
See pt. Request. Request in regard to [facility] sent yesterday per pharmacy.

## 2017-12-20 NOTE — Patient Instructions (Signed)
Medication Instructions:  1.) STOP aspirin on May 15, 2018  If you need a refill on your cardiac medications before your next appointment, please call your pharmacy.   Lab work: none If you have labs (blood work) drawn today and your tests are completely normal, you will receive your results only by: Marland Kitchen MyChart Message (if you have MyChart) OR . A paper copy in the mail If you have any lab test that is abnormal or we need to change your treatment, we will call you to review the results.  Testing/Procedures: Your physician has recommended that you wear an event monitor. Event monitors are medical devices that record the heart's electrical activity. Doctors most often [place] these monitors to diagnose arrhythmias. Arrhythmias are problems with the speed or rhythm of the heartbeat. The monitor is a small, portable device. You can wear one while you do your normal daily activities. This is usually used to diagnose what is causing palpitations/syncope (passing out).   Follow-Up: Your physician recommends that you schedule a follow-up appointment in: 3-4 months with Dr. Percival Spanish in [place].  Any Other Special Instructions Will Be Listed Below (If Applicable).

## 2017-12-20 NOTE — Telephone Encounter (Signed)
No 5 days is usually enough. If she is symptomatic when done we can retreat but it is not usually necessary.

## 2017-12-20 NOTE — Telephone Encounter (Signed)
Please advise 

## 2017-12-25 ENCOUNTER — Other Ambulatory Visit: Payer: Self-pay | Admitting: Physician Assistant

## 2017-12-25 ENCOUNTER — Telehealth: Payer: Self-pay | Admitting: Cardiology

## 2017-12-25 DIAGNOSIS — K838 Other specified diseases of biliary tract: Secondary | ICD-10-CM

## 2017-12-25 NOTE — Telephone Encounter (Signed)
Patient is calling regarding phone conversation she had with Kathlene November PA last week. She was just waking up from a nap and would like Katie to call her back to discuss results and referral to PCP.  Will forward to [facility]

## 2017-12-25 NOTE — Telephone Encounter (Signed)
New message:    Patient calling back from last week about a test she did and would like for some one to call her back.

## 2017-12-25 NOTE — Telephone Encounter (Signed)
Patient notified

## 2017-12-25 NOTE — Telephone Encounter (Signed)
I will take care of it. Thanks [facility]

## 2017-12-25 NOTE — Telephone Encounter (Signed)
The medication was correct according to PCP  Patient made aware

## 2017-12-27 ENCOUNTER — Ambulatory Visit (INDEPENDENT_AMBULATORY_CARE_PROVIDER_SITE_OTHER): Payer: Medicare Other

## 2017-12-27 ENCOUNTER — Telehealth: Payer: Self-pay | Admitting: Cardiovascular Disease

## 2017-12-27 DIAGNOSIS — Z7901 Long term (current) use of anticoagulants: Secondary | ICD-10-CM | POA: Diagnosis not present

## 2017-12-27 DIAGNOSIS — Z86718 Personal history of other venous thrombosis and embolism: Secondary | ICD-10-CM

## 2017-12-27 LAB — POCT INR: INR: 1.8 — AB (ref 2.0–3.0)

## 2017-12-27 NOTE — Telephone Encounter (Signed)
Received critical report from [facility] reporting "new afib, HRs 130-140s, auto detected at 2:10 pm central time.  Pt reports, "my heart has been active today".  Reviewed w/ Dr. Angelena Form.  Orders received. Pt advised to take extra Lopressor now for her elevated HRs.  Advised to continue to monitor and call office if needed.

## 2017-12-27 NOTE — Telephone Encounter (Signed)
New Message          Critical EKG from [facility]

## 2017-12-27 NOTE — Progress Notes (Signed)
Pre visit review using our clinic tool,if applicable. No additional management support is needed unless otherwise documented below in the visit note.  Pt here for INR check per order from Dr. Charlett Blake.  Goal INR = 2.0-3.0  Last INR = 1.2  Pt currently takes Coumadin 5 mg daily except for Tuesday and Saturday 7.5 mg.  Pt denies recent antibiotics, no dietary changes and no unusual bruising / bleeding.  INR today = 1.8  Pt advised per Dr. Charlett Blake to continue medications as ordered and return for INR re-check in 2 weeks.

## 2018-01-01 ENCOUNTER — Telehealth: Payer: Self-pay | Admitting: Cardiovascular Disease

## 2018-01-01 NOTE — Telephone Encounter (Signed)
  Patient is having episodes of Afib that woke her up last night and it's still happening. Patient would like to know what to do about it because she says the afib doesn't usually last this long.

## 2018-01-01 NOTE — Telephone Encounter (Signed)
I spoke with pt. She reports episode of atrial fib which woke her up about 3:30 this AM. Was unable to get back to sleep.  Took AM dose of lopressor and Cartia this AM and heart rate is "calming down" now.  She reports daily episodes of atrial fib recently. Does not know rate or how long it lasts.  Episode this AM was the longest she has had. She is currently wearing event monitor and states she reported episode.  On 11/21 she took one time extra dose of lopressor (see phone note). Is taking coumadin.  Pt is asking what she can do if afib happens again. Pt lives in [facility] and is seen by Dr. Percival Spanish in the [place] office.

## 2018-01-01 NOTE — Telephone Encounter (Signed)
Fraser Din, I would route this to Dr. Percival Spanish and his nurse since she is his patient. Gerald Stabs

## 2018-01-02 ENCOUNTER — Telehealth: Payer: Self-pay | Admitting: Cardiology

## 2018-01-02 MED ORDER — METOPROLOL TARTRATE 50 MG PO TABS: 50.0000 mg | ORAL_TABLET | Freq: Two times a day (BID) | ORAL | Status: DC

## 2018-01-02 NOTE — Telephone Encounter (Signed)
New Message         Patient c/o Palpitations:  High priority if patient c/o lightheadedness, shortness of breath, or chest pain  1) How long have you had palpitations/irregular HR/ Afib? Are you having the symptoms now? Yes  2) Are you currently experiencing lightheadedness, SOB or CP? SOB  3) Do you have a history of afib (atrial fibrillation) or irregular heart rhythm? Yes  4) Have you checked your BP or HR? (document readings if available): No  5) Are you experiencing any other symptoms? Heart is fluttering, dizziness, racing. Waking patient up at night

## 2018-01-02 NOTE — Telephone Encounter (Signed)
Follow up    Pt is returning call about message yesterday. Please call.

## 2018-01-02 NOTE — Addendum Note (Signed)
Addended by: Meryl Crutch on: 01/02/2018 01:23 PM   Modules accepted: Orders

## 2018-01-02 NOTE — Telephone Encounter (Signed)
See previous encounter

## 2018-01-02 NOTE — Telephone Encounter (Signed)
Spoke with pt who states she is following up on call from yesterday, as she is still having symptoms. She reports she can still her heart is racing but doesn't have a way to check HR and BP. She report symptoms are becoming more frequent and sometimes causes her to become SOB. Per Dr. Ellyn Hack (DOD), pt is to take additional dose of metoprolol today then increase dose to 50 mg BID. Pt verbalized understanding.

## 2018-01-04 ENCOUNTER — Other Ambulatory Visit: Payer: Self-pay | Admitting: Family Medicine

## 2018-01-08 ENCOUNTER — Telehealth: Payer: Self-pay | Admitting: Physician Assistant

## 2018-01-08 NOTE — Telephone Encounter (Signed)
New message   Patient needs to speak with someone in reference to getting a referral for a gastroentorologist.  Please advise.

## 2018-01-08 NOTE — Telephone Encounter (Signed)
I called and got her an appt with GI this Friday. Patient is aware. Nothing else to do. Thank you!

## 2018-01-09 ENCOUNTER — Encounter: Payer: Self-pay | Admitting: Thoracic Surgery (Cardiothoracic Vascular Surgery)

## 2018-01-10 ENCOUNTER — Ambulatory Visit: Payer: Medicare Other

## 2018-01-10 LAB — ECHOCARDIOGRAM COMPLETE
HEIGHTINCHES: 65 in
WEIGHTICAEL: 2688 [oz_av]

## 2018-01-11 ENCOUNTER — Other Ambulatory Visit (INDEPENDENT_AMBULATORY_CARE_PROVIDER_SITE_OTHER): Payer: Medicare Other

## 2018-01-11 ENCOUNTER — Ambulatory Visit (INDEPENDENT_AMBULATORY_CARE_PROVIDER_SITE_OTHER): Payer: Medicare Other | Admitting: Gastroenterology

## 2018-01-11 ENCOUNTER — Ambulatory Visit (INDEPENDENT_AMBULATORY_CARE_PROVIDER_SITE_OTHER): Payer: Medicare Other

## 2018-01-11 ENCOUNTER — Encounter: Payer: Self-pay | Admitting: Gastroenterology

## 2018-01-11 VITALS — BP 134/80 | HR 60 | Ht 65.0 in | Wt 166.0 lb

## 2018-01-11 DIAGNOSIS — Z7901 Long term (current) use of anticoagulants: Secondary | ICD-10-CM

## 2018-01-11 DIAGNOSIS — R935 Abnormal findings on diagnostic imaging of other abdominal regions, including retroperitoneum: Secondary | ICD-10-CM

## 2018-01-11 LAB — LIPASE: Lipase: 6 U/L — ABNORMAL LOW (ref 11.0–59.0)

## 2018-01-11 LAB — POCT INR: INR: 2.4 (ref 2.0–3.0)

## 2018-01-11 LAB — CBC WITH DIFFERENTIAL/PLATELET
Basophils Absolute: 0.1 10*3/uL (ref 0.0–0.1)
Basophils Relative: 0.9 % (ref 0.0–3.0)
Eosinophils Absolute: 0.1 10*3/uL (ref 0.0–0.7)
Eosinophils Relative: 2 % (ref 0.0–5.0)
HCT: 40.1 % (ref 36.0–46.0)
Hemoglobin: 13.4 g/dL (ref 12.0–15.0)
Lymphocytes Relative: 16 % (ref 12.0–46.0)
Lymphs Abs: 1.2 10*3/uL (ref 0.7–4.0)
MCHC: 33.4 g/dL (ref 30.0–36.0)
MCV: 91.9 fl (ref 78.0–100.0)
MONO ABS: 0.5 10*3/uL (ref 0.1–1.0)
Monocytes Relative: 7.1 % (ref 3.0–12.0)
Neutro Abs: 5.5 10*3/uL (ref 1.4–7.7)
Neutrophils Relative %: 74 % (ref 43.0–77.0)
Platelets: 203 10*3/uL (ref 150.0–400.0)
RBC: 4.37 Mil/uL (ref 3.87–5.11)
RDW: 14.1 % (ref 11.5–15.5)
WBC: 7.4 10*3/uL (ref 4.0–10.5)

## 2018-01-11 LAB — COMPREHENSIVE METABOLIC PANEL
ALT: 13 U/L (ref 0–35)
AST: 17 U/L (ref 0–37)
Albumin: 4.4 g/dL (ref 3.5–5.2)
Alkaline Phosphatase: 92 U/L (ref 39–117)
BUN: 19 mg/dL (ref 6–23)
CO2: 29 mEq/L (ref 19–32)
Calcium: 9.2 mg/dL (ref 8.4–10.5)
Chloride: 103 mEq/L (ref 96–112)
Creatinine, Ser: 0.63 mg/dL (ref 0.40–1.20)
GFR: 96.83 mL/min (ref >60.00)
Glucose, Bld: 90 mg/dL (ref 70–99)
Potassium: 4.2 mEq/L (ref 3.5–5.1)
Sodium: 142 mEq/L (ref 135–145)
Total Bilirubin: 0.5 mg/dL (ref 0.2–1.2)
Total Protein: 7 g/dL (ref 6.0–8.3)

## 2018-01-11 NOTE — Progress Notes (Signed)
Pre visit review using our clinic tool,if applicable. No additional management support is needed unless otherwise documented below in the visit note.   Pt here for INR check per  Goal INR = 2.0-3.0  Last INR = 1.8  Pt currently takes Coumadin 5 mg daily exceept Tuesday and Saturday 7.5 mg.  Pt denies recent antibiotics, no dietary changes and no unusual bruising / bleeding.  INR today = 2.4  Pt advised per Elby Beck DOD, continue same dose and return in 1 month for INR re-check. Appointment scheduled  --

## 2018-01-11 NOTE — Patient Instructions (Signed)
If you are age 79 or older, your body mass index should be between 23-30. Your Body mass index is 27.62 kg/m. If this is out of the aforementioned range listed, please consider follow up with your Primary Care Provider.  If you are age 62 or younger, your body mass index should be between 19-25. Your Body mass index is 27.62 kg/m. If this is out of the aformentioned range listed, please consider follow up with your Primary Care Provider.   Please go to the lab on the 2nd floor [address] before you leave the office today.   Thank you,  Dr. Jackquline Denmark
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
## 2018-01-14 LAB — CANCER ANTIGEN 19-9: CA 19-9: 15 U/mL (ref <34)

## 2018-01-14 LAB — CEA: CEA: 1.5 ng/mL

## 2018-01-17 ENCOUNTER — Encounter: Payer: Self-pay | Admitting: Cardiology

## 2018-01-17 ENCOUNTER — Ambulatory Visit (INDEPENDENT_AMBULATORY_CARE_PROVIDER_SITE_OTHER): Payer: Medicare Other | Admitting: Cardiology

## 2018-01-17 ENCOUNTER — Encounter (INDEPENDENT_AMBULATORY_CARE_PROVIDER_SITE_OTHER): Payer: Self-pay

## 2018-01-17 VITALS — BP 148/78 | HR 55 | Ht 65.0 in | Wt 165.0 lb

## 2018-01-17 DIAGNOSIS — I480 Paroxysmal atrial fibrillation: Secondary | ICD-10-CM

## 2018-01-17 DIAGNOSIS — I5032 Chronic diastolic (congestive) heart failure: Secondary | ICD-10-CM

## 2018-01-17 DIAGNOSIS — Z952 Presence of prosthetic heart valve: Secondary | ICD-10-CM

## 2018-01-17 MED ORDER — FUROSEMIDE 20 MG PO TABS: ORAL_TABLET | ORAL | 3 refills | Status: DC

## 2018-01-17 MED ORDER — DILTIAZEM HCL ER COATED BEADS 240 MG PO CP24: 240.0000 mg | ORAL_CAPSULE | Freq: Every day | ORAL | 3 refills | Status: DC

## 2018-01-17 MED ORDER — METOPROLOL TARTRATE 25 MG PO TABS: 25.0000 mg | ORAL_TABLET | Freq: Four times a day (QID) | ORAL | 11 refills | Status: DC

## 2018-01-17 MED ORDER — METOPROLOL TARTRATE 50 MG PO TABS: 50.0000 mg | ORAL_TABLET | Freq: Two times a day (BID) | ORAL | 3 refills | Status: DC

## 2018-01-17 NOTE — Patient Instructions (Addendum)
Medication Instructions:  INCREASE- Metoprolol 25 mg 1 tablets four times a day as needed  If you need a refill on your cardiac medications before your next appointment, please call your pharmacy.  Labwork: None Ordered   If you have labs (blood work) drawn today and your tests are completely normal, you will receive your results only by: Marland Kitchen MyChart Message (if you have MyChart) OR . A paper copy in the mail If you have any lab test that is abnormal or we need to change your treatment, we will call you to review the results.  Testing/Procedures: None Ordered  Follow-Up: . You will need a follow up appointment in 1 Month.    At [facility], you and your health needs are our priority.  As part of our continuing mission to provide you with exceptional heart care, we have created designated Provider Care Teams.  These Care Teams include your primary Cardiologist (physician) and Advanced Practice Providers (APPs -  Physician Assistants and Nurse Practitioners) who all work together to provide you with the care you need, when you need it.

## 2018-01-22 ENCOUNTER — Ambulatory Visit (INDEPENDENT_AMBULATORY_CARE_PROVIDER_SITE_OTHER): Payer: Medicare Other | Admitting: *Deleted

## 2018-01-22 ENCOUNTER — Encounter: Payer: Self-pay | Admitting: *Deleted

## 2018-01-22 VITALS — BP 140/66 | HR 58 | Ht 65.0 in | Wt 166.5 lb

## 2018-01-22 DIAGNOSIS — Z0000 Encounter for general adult medical examination without abnormal findings: Secondary | ICD-10-CM | POA: Diagnosis not present

## 2018-01-22 NOTE — Patient Instructions (Signed)
Please schedule your next medicare wellness visit with me in 1 yr.  Continue to eat heart healthy diet (full of fruits, vegetables, whole grains, lean protein, water--limit salt, fat, and sugar intake) and increase physical activity as tolerated.  Continue doing brain stimulating activities (puzzles, reading, adult coloring books, staying active) to keep memory sharp.     Mikayla [name] , Thank you for taking time to come for your Medicare Wellness Visit. I appreciate your ongoing commitment to your health goals. Please review the following plan we discussed and let me know if I can assist you in the future.   These are the goals we discussed: Goals    . Maintain current health and independence. (pt-stated)    . start cardiac rehab       This is a list of the screening recommended for you and due dates:  Health Maintenance  Topic Date Due  . Tetanus Vaccine  12/06/2021  . Flu Shot  Completed  . DEXA scan (bone density measurement)  Completed  . Pneumonia vaccines  Addressed     Health Maintenance for Postmenopausal Women Menopause is a normal process in which your reproductive ability comes to an end. This process happens gradually over a span of months to years, usually between the ages of 56 and 32. Menopause is complete when you have missed 12 consecutive menstrual periods. It is important to talk with your health care provider about some of the most common conditions that affect postmenopausal women, such as heart disease, cancer, and bone loss (osteoporosis). Adopting a healthy lifestyle and getting preventive care can help to promote your health and wellness. Those actions can also lower your chances of developing some of these common conditions. What should I know about menopause? During menopause, you may experience a number of symptoms, such as:  Moderate-to-severe hot flashes.  Night sweats.  Decrease in sex drive.  Mood  swings.  Headaches.  Tiredness.  Irritability.  Memory problems.  Insomnia.  Choosing to treat or not to treat menopausal changes is an individual decision that you make with your health care provider. What should I know about hormone replacement therapy and supplements? Hormone therapy products are effective for treating symptoms that are associated with menopause, such as hot flashes and night sweats. Hormone replacement carries certain risks, especially as you become older. If you are thinking about using estrogen or estrogen with progestin treatments, discuss the benefits and risks with your health care provider. What should I know about heart disease and stroke? Heart disease, heart attack, and stroke become more likely as you age. This may be due, in part, to the hormonal changes that your body experiences during menopause. These can affect how your body processes dietary fats, triglycerides, and cholesterol. Heart attack and stroke are both medical emergencies. There are many things that you can do to help prevent heart disease and stroke:  Have your blood pressure checked at least every 1-2 years. High blood pressure causes heart disease and increases the risk of stroke.  If you are 67-2 years old, ask your health care provider if you should take aspirin to prevent a heart attack or a stroke.  Do not use any tobacco products, including cigarettes, chewing tobacco, or electronic cigarettes. If you need help quitting, ask your health care provider.  It is important to eat a healthy diet and maintain a healthy weight. ? Be sure to include plenty of vegetables, fruits, low-fat dairy products, and lean protein. ? Avoid eating foods  that are high in solid fats, added sugars, or salt (sodium).  Get regular exercise. This is one of the most important things that you can do for your health. ? Try to exercise for at least 150 minutes each week. The type of exercise that you do should  increase your heart rate and make you sweat. This is known as moderate-intensity exercise. ? Try to do strengthening exercises at least twice each week. Do these in addition to the moderate-intensity exercise.  Know your numbers.Ask your health care provider to check your cholesterol and your blood glucose. Continue to have your blood tested as directed by your health care provider.  What should I know about cancer screening? There are several types of cancer. Take the following steps to reduce your risk and to catch any cancer development as early as possible. Breast Cancer  Practice breast self-awareness. ? This means understanding how your breasts normally appear and feel. ? It also means doing regular breast self-exams. Let your health care provider know about any changes, no matter how small.  If you are 60 or older, have a clinician do a breast exam (clinical breast exam or CBE) every year. Depending on your age, family history, and medical history, it may be recommended that you also have a yearly breast X-ray (mammogram).  If you have a family history of breast cancer, talk with your health care provider about genetic screening.  If you are at high risk for breast cancer, talk with your health care provider about having an MRI and a mammogram every year.  Breast cancer (BRCA) gene test is recommended for women who have family members with BRCA-related cancers. Results of the assessment will determine the need for genetic counseling and BRCA1 and for BRCA2 testing. BRCA-related cancers include these types: ? Breast. This occurs in males or females. ? Ovarian. ? Tubal. This may also be called fallopian tube cancer. ? Cancer of the abdominal or pelvic lining (peritoneal cancer). ? Prostate. ? Pancreatic.  Cervical, Uterine, and Ovarian Cancer Your health care provider may recommend that you be screened regularly for cancer of the pelvic organs. These include your ovaries, uterus,  and vagina. This screening involves a pelvic exam, which includes checking for microscopic changes to the surface of your cervix (Pap test).  For women ages 21-65, health care providers may recommend a pelvic exam and a Pap test every three years. For women ages 39-65, they may recommend the Pap test and pelvic exam, combined with testing for human papilloma virus (HPV), every five years. Some types of HPV increase your risk of cervical cancer. Testing for HPV may also be done on women of any age who have unclear Pap test results.  Other health care providers may not recommend any screening for nonpregnant women who are considered low risk for pelvic cancer and have no symptoms. Ask your health care provider if a screening pelvic exam is right for you.  If you have had past treatment for cervical cancer or a condition that could lead to cancer, you need Pap tests and screening for cancer for at least 20 years after your treatment. If Pap tests have been discontinued for you, your risk factors (such as having a new sexual partner) need to be reassessed to determine if you should start having screenings again. Some women have medical problems that increase the chance of getting cervical cancer. In these cases, your health care provider may recommend that you have screening and Pap tests more often.  If you have a family history of uterine cancer or ovarian cancer, talk with your health care provider about genetic screening.  If you have vaginal bleeding after reaching menopause, tell your health care provider.  There are currently no reliable tests available to screen for ovarian cancer.  Lung Cancer Lung cancer screening is recommended for adults 37-12 years old who are at high risk for lung cancer because of a history of smoking. A yearly low-dose CT scan of the lungs is recommended if you:  Currently smoke.  Have a history of at least 30 pack-years of smoking and you currently smoke or have quit  within the past 15 years. A pack-year is smoking an average of one pack of cigarettes per day for one year.  Yearly screening should:  Continue until it has been 15 years since you quit.  Stop if you develop a health problem that would prevent you from having lung cancer treatment.  Colorectal Cancer  This type of cancer can be detected and can often be prevented.  Routine colorectal cancer screening usually begins at age 78 and continues through age 36.  If you have risk factors for colon cancer, your health care provider may recommend that you be screened at an earlier age.  If you have a family history of colorectal cancer, talk with your health care provider about genetic screening.  Your health care provider may also recommend using home test kits to check for hidden blood in your stool.  A small camera at the end of a tube can be used to examine your colon directly (sigmoidoscopy or colonoscopy). This is done to check for the earliest forms of colorectal cancer.  Direct examination of the colon should be repeated every 5-10 years until age 66. However, if early forms of precancerous polyps or small growths are found or if you have a family history or genetic risk for colorectal cancer, you may need to be screened more often.  Skin Cancer  Check your skin from head to toe regularly.  Monitor any moles. Be sure to tell your health care provider: ? About any new moles or changes in moles, especially if there is a change in a mole's shape or color. ? If you have a mole that is larger than the size of a pencil eraser.  If any of your family members has a history of skin cancer, especially at a young age, talk with your health care provider about genetic screening.  Always use sunscreen. Apply sunscreen liberally and repeatedly throughout the day.  Whenever you are outside, protect yourself by wearing long sleeves, pants, a wide-brimmed hat, and sunglasses.  What should I know  about osteoporosis? Osteoporosis is a condition in which bone destruction happens more quickly than new bone creation. After menopause, you may be at an increased risk for osteoporosis. To help prevent osteoporosis or the bone fractures that can happen because of osteoporosis, the following is recommended:  If you are 96-17 years old, get at least 1,000 mg of calcium and at least 600 mg of vitamin D per day.  If you are older than age 69 but younger than age 78, get at least 1,200 mg of calcium and at least 600 mg of vitamin D per day.  If you are older than age 39, get at least 1,200 mg of calcium and at least 800 mg of vitamin D per day.  Smoking and excessive alcohol intake increase the risk of osteoporosis. Eat foods that are rich  in calcium and vitamin D, and do weight-bearing exercises several times each week as directed by your health care provider. What should I know about how menopause affects my mental health? Depression may occur at any age, but it is more common as you become older. Common symptoms of depression include:  Low or sad mood.  Changes in sleep patterns.  Changes in appetite or eating patterns.  Feeling an overall lack of motivation or enjoyment of activities that you previously enjoyed.  Frequent crying spells.  Talk with your health care provider if you think that you are experiencing depression. What should I know about immunizations? It is important that you get and maintain your immunizations. These include:  Tetanus, diphtheria, and pertussis (Tdap) booster vaccine.  Influenza every year before the flu season begins.  Pneumonia vaccine.  Shingles vaccine.  Your health care provider may also recommend other immunizations. This information is not intended to replace advice given to you by your health care provider. Make sure you discuss any questions you have with your health care provider. Document Released: 03/17/2005 Document Revised: 08/13/2015  Document Reviewed: 10/27/2014 Elsevier Interactive Patient Education  2018 [employer].
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
## 2018-02-21 ENCOUNTER — Ambulatory Visit (INDEPENDENT_AMBULATORY_CARE_PROVIDER_SITE_OTHER): Payer: Medicare Other | Admitting: Family Medicine

## 2018-02-21 VITALS — BP 138/62 | HR 53 | Temp 97.4°F | Resp 18 | Wt 167.0 lb

## 2018-02-21 DIAGNOSIS — I480 Paroxysmal atrial fibrillation: Secondary | ICD-10-CM

## 2018-02-21 DIAGNOSIS — I5033 Acute on chronic diastolic (congestive) heart failure: Secondary | ICD-10-CM | POA: Diagnosis not present

## 2018-02-21 DIAGNOSIS — I10 Essential (primary) hypertension: Secondary | ICD-10-CM | POA: Diagnosis not present

## 2018-02-21 DIAGNOSIS — I350 Nonrheumatic aortic (valve) stenosis: Secondary | ICD-10-CM | POA: Diagnosis not present

## 2018-02-21 DIAGNOSIS — E785 Hyperlipidemia, unspecified: Secondary | ICD-10-CM | POA: Diagnosis not present

## 2018-02-21 LAB — COMPREHENSIVE METABOLIC PANEL
ALT: 13 U/L (ref 0–35)
AST: 18 U/L (ref 0–37)
Albumin: 4.2 g/dL (ref 3.5–5.2)
Alkaline Phosphatase: 80 U/L (ref 39–117)
BUN: 14 mg/dL (ref 6–23)
CHLORIDE: 104 meq/L (ref 96–112)
CO2: 30 mEq/L (ref 19–32)
Calcium: 9.4 mg/dL (ref 8.4–10.5)
Creatinine, Ser: 0.66 mg/dL (ref 0.40–1.20)
GFR: 86.31 mL/min (ref >60.00)
Glucose, Bld: 97 mg/dL (ref 70–99)
Potassium: 4.8 mEq/L (ref 3.5–5.1)
Sodium: 144 mEq/L (ref 135–145)
Total Bilirubin: 0.5 mg/dL (ref 0.2–1.2)
Total Protein: 6.7 g/dL (ref 6.0–8.3)

## 2018-02-21 LAB — CBC
HEMATOCRIT: 40 % (ref 36.0–46.0)
HEMOGLOBIN: 13 g/dL (ref 12.0–15.0)
MCHC: 32.6 g/dL (ref 30.0–36.0)
MCV: 92.4 fl (ref 78.0–100.0)
Platelets: 214 10*3/uL (ref 150.0–400.0)
RBC: 4.33 Mil/uL (ref 3.87–5.11)
RDW: 14.5 % (ref 11.5–15.5)
WBC: 7.4 10*3/uL (ref 4.0–10.5)

## 2018-02-21 LAB — LIPID PANEL
Cholesterol: 195 mg/dL (ref 0–200)
HDL: 61 mg/dL (ref >39.00)
LDL CALC: 117 mg/dL — AB (ref 0–99)
NonHDL: 134.1
Total CHOL/HDL Ratio: 3
Triglycerides: 86 mg/dL (ref 0.0–149.0)
VLDL: 17.2 mg/dL (ref 0.0–40.0)

## 2018-02-21 LAB — PROTIME-INR
INR: 2.2 ratio — ABNORMAL HIGH (ref 0.8–1.0)
Prothrombin Time: 25.9 s — ABNORMAL HIGH (ref 9.6–13.1)

## 2018-02-21 LAB — TSH: TSH: 2.09 u[IU]/mL (ref 0.35–4.50)

## 2018-02-21 NOTE — Assessment & Plan Note (Signed)
S/p TAVR  Is improving and has Cardiopulmonary rehab at [facility] has an interview with them later this month.

## 2018-02-21 NOTE — Assessment & Plan Note (Signed)
Well controlled, no changes to meds. Encouraged heart healthy diet such as the DASH diet and exercise as tolerated.  °

## 2018-02-21 NOTE — Patient Instructions (Addendum)
Systolic BP is the top number 100-140, occasional 150s and 160s is fine. Call if blood pressure does not come down with recheck over 185.  Diastolic is the bottom number 60-90 occasional numbers in to the 90s is acceptable. If BP stasy abovce 100 let us know  Protein is your friend Hypertension Hypertension, commonly called high blood pressure, is when the force of blood pumping through the arteries is too strong. The arteries are the blood vessels that carry blood from the heart throughout the body. Hypertension forces the heart to work harder to pump blood and may cause arteries to become narrow or stiff. Having untreated or uncontrolled hypertension can cause heart attacks, strokes, kidney disease, and other problems. A blood pressure reading consists of a higher number over a lower number. Ideally, your blood pressure should be below 120/80. The first ("top") number is called the systolic pressure. It is a measure of the pressure in your arteries as your heart beats. The second ("bottom") number is called the diastolic pressure. It is a measure of the pressure in your arteries as the heart relaxes. What are the causes? The cause of this condition is not known. What increases the risk? Some risk factors for high blood pressure are under your control. Others are not. Factors you can change  Smoking.  Having type 2 diabetes mellitus, high cholesterol, or both.  Not getting enough exercise or physical activity.  Being overweight.  Having too much fat, sugar, calories, or salt (sodium) in your diet.  Drinking too much alcohol. Factors that are difficult or impossible to change  Having chronic kidney disease.  Having a family history of high blood pressure.  Age. Risk increases with age.  Race. You may be at higher risk if you are African-American.  Gender. Men are at higher risk than women before age 13. After age 33, women are at higher risk than men.  Having obstructive sleep  apnea.  Stress. What are the signs or symptoms? Extremely high blood pressure (hypertensive crisis) may cause:  Headache.  Anxiety.  Shortness of breath.  Nosebleed.  Nausea and vomiting.  Severe chest pain.  Jerky movements you cannot control (seizures). How is this diagnosed? This condition is diagnosed by measuring your blood pressure while you are seated, with your arm resting on a surface. The cuff of the blood pressure monitor will be placed directly against the skin of your upper arm at the level of your heart. It should be measured at least twice using the same arm. Certain conditions can cause a difference in blood pressure between your right and left arms. Certain factors can cause blood pressure readings to be lower or higher than normal (elevated) for a short period of time:  When your blood pressure is higher when you are in a health care provider's office than when you are at home, this is called white coat hypertension. Most people with this condition do not need medicines.  When your blood pressure is higher at home than when you are in a health care provider's office, this is called masked hypertension. Most people with this condition may need medicines to control blood pressure. If you have a high blood pressure reading during one visit or you have normal blood pressure with other risk factors:  You may be asked to return on a different day to have your blood pressure checked again.  You may be asked to monitor your blood pressure at home for 1 week or longer. If you are  diagnosed with hypertension, you may have other blood or imaging tests to help your health care provider understand your overall risk for other conditions. How is this treated? This condition is treated by making healthy lifestyle changes, such as eating healthy foods, exercising more, and reducing your alcohol intake. Your health care provider may prescribe medicine if lifestyle changes are not  enough to get your blood pressure under control, and if:  Your systolic blood pressure is above 130.  Your diastolic blood pressure is above 80. Your personal target blood pressure may vary depending on your medical conditions, your age, and other factors. Follow these instructions at home: Eating and drinking   Eat a diet that is high in fiber and potassium, and low in sodium, added sugar, and fat. An example eating plan is called the DASH (Dietary Approaches to Stop Hypertension) diet. To eat this way: ? Eat plenty of fresh fruits and vegetables. Try to fill half of your plate at each meal with fruits and vegetables. ? Eat whole grains, such as whole wheat pasta, brown rice, or whole grain bread. Fill about one quarter of your plate with whole grains. ? Eat or drink low-fat dairy products, such as skim milk or low-fat yogurt. ? Avoid fatty cuts of meat, processed or cured meats, and poultry with skin. Fill about one quarter of your plate with lean proteins, such as fish, chicken without skin, beans, eggs, and tofu. ? Avoid premade and processed foods. These tend to be higher in sodium, added sugar, and fat.  Reduce your daily sodium intake. Most people with hypertension should eat less than 1,500 mg of sodium a day.  Limit alcohol intake to no more than 1 drink a day for nonpregnant women and 2 drinks a day for men. One drink equals 12 oz of beer, 5 oz of wine, or 1 oz of hard liquor. Lifestyle   Work with your health care provider to maintain a healthy body weight or to lose weight. Ask what an ideal weight is for you.  Get at least 30 minutes of exercise that causes your heart to beat faster (aerobic exercise) most days of the week. Activities may include walking, swimming, or biking.  Include exercise to strengthen your muscles (resistance exercise), such as pilates or lifting weights, as part of your weekly exercise routine. Try to do these types of exercises for 30 minutes at least  3 days a week.  Do not use any products that contain nicotine or tobacco, such as cigarettes and e-cigarettes. If you need help quitting, ask your health care provider.  Monitor your blood pressure at home as told by your health care provider.  Keep all follow-up visits as told by your health care provider. This is important. Medicines  Take over-the-counter and prescription medicines only as told by your health care provider. Follow directions carefully. Blood pressure medicines must be taken as prescribed.  Do not skip doses of blood pressure medicine. Doing this puts you at risk for problems and can make the medicine less effective.  Ask your health care provider about side effects or reactions to medicines that you should watch for. Contact a health care provider if:  You think you are having a reaction to a medicine you are taking.  You have headaches that keep coming back (recurring).  You feel dizzy.  You have swelling in your ankles.  You have trouble with your vision. Get help right away if:  You develop a severe headache or confusion.  You have unusual weakness or numbness.  You feel faint.  You have severe pain in your chest or abdomen.  You vomit repeatedly.  You have trouble breathing. Summary  Hypertension is when the force of blood pumping through your arteries is too strong. If this condition is not controlled, it may put you at risk for serious complications.  Your personal target blood pressure may vary depending on your medical conditions, your age, and other factors. For most people, a normal blood pressure is less than 120/80.  Hypertension is treated with lifestyle changes, medicines, or a combination of both. Lifestyle changes include weight loss, eating a healthy, low-sodium diet, exercising more, and limiting alcohol. This information is not intended to replace advice given to you by your health care provider. Make sure you discuss any questions  you have with your health care provider. Document Released: 01/23/2005 Document Revised: 12/22/2015 Document Reviewed: 12/22/2015 Elsevier Interactive Patient Education  2019 [employer].

## 2018-02-25 NOTE — Assessment & Plan Note (Signed)
Encouraged heart healthy diet, increase exercise, avoid trans fats, consider a krill oil cap daily 
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
## 2018-03-05 DIAGNOSIS — Z5189 Encounter for other specified aftercare: Secondary | ICD-10-CM | POA: Diagnosis not present

## 2018-03-05 DIAGNOSIS — Z954 Presence of other heart-valve replacement: Secondary | ICD-10-CM | POA: Diagnosis not present

## 2018-03-06 ENCOUNTER — Telehealth: Payer: Self-pay | Admitting: Family Medicine

## 2018-03-07 ENCOUNTER — Other Ambulatory Visit: Payer: Self-pay | Admitting: Family Medicine

## 2018-03-07 MED ORDER — WARFARIN SODIUM 5 MG PO TABS: 5.0000 mg | ORAL_TABLET | ORAL | 1 refills | Status: DC

## 2018-03-07 NOTE — Addendum Note (Signed)
Addended by: Magdalene Molly A on: 03/07/2018 01:32 PM   Modules accepted: Orders

## 2018-03-07 NOTE — Telephone Encounter (Signed)
Sent medication in as a 90 day supply

## 2018-03-07 NOTE — Telephone Encounter (Signed)
Kristopher Oppenheim calling and would like to know if the warfarin (COUMADIN) 5 MG tablet Prescription could be sent in as a 90 day supply. Please advise.

## 2018-03-11 DIAGNOSIS — Z48812 Encounter for surgical aftercare following surgery on the circulatory system: Secondary | ICD-10-CM | POA: Diagnosis not present

## 2018-03-11 DIAGNOSIS — Z954 Presence of other heart-valve replacement: Secondary | ICD-10-CM | POA: Diagnosis not present
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
## 2018-03-13 ENCOUNTER — Ambulatory Visit (INDEPENDENT_AMBULATORY_CARE_PROVIDER_SITE_OTHER): Payer: Medicare Other | Admitting: Cardiology

## 2018-03-13 ENCOUNTER — Encounter: Payer: Self-pay | Admitting: Cardiology

## 2018-03-13 VITALS — BP 160/62 | HR 51 | Ht 65.0 in | Wt 166.0 lb

## 2018-03-13 DIAGNOSIS — Z952 Presence of prosthetic heart valve: Secondary | ICD-10-CM | POA: Diagnosis not present

## 2018-03-13 DIAGNOSIS — I480 Paroxysmal atrial fibrillation: Secondary | ICD-10-CM

## 2018-03-13 DIAGNOSIS — I10 Essential (primary) hypertension: Secondary | ICD-10-CM | POA: Diagnosis not present

## 2018-03-13 DIAGNOSIS — I5032 Chronic diastolic (congestive) heart failure: Secondary | ICD-10-CM | POA: Diagnosis not present

## 2018-03-13 MED ORDER — CHLORTHALIDONE 25 MG PO TABS: 25.0000 mg | ORAL_TABLET | Freq: Every day | ORAL | 3 refills | Status: DC

## 2018-03-13 MED ORDER — METOPROLOL TARTRATE 25 MG PO TABS: 25.0000 mg | ORAL_TABLET | Freq: Three times a day (TID) | ORAL | Status: DC

## 2018-03-13 NOTE — Patient Instructions (Signed)
Medication Instructions:  Please decrease your Metoprolol to three times a day. Start Chlorthalidone 25 mg daily. Continue all other medications as listed.  If you need a refill on your cardiac medications before your next appointment, please call your pharmacy.   Follow-Up: Follow up in 4 months with Dr. Percival Spanish in [place].  You will receive a letter in the mail 2 months before you are due.  Please call us when you receive this letter to schedule your follow up appointment.  Thank you for choosing [facility]!!

## 2018-03-18 DIAGNOSIS — Z48812 Encounter for surgical aftercare following surgery on the circulatory system: Secondary | ICD-10-CM | POA: Diagnosis not present

## 2018-03-18 DIAGNOSIS — Z954 Presence of other heart-valve replacement: Secondary | ICD-10-CM | POA: Diagnosis not present

## 2018-03-21 DIAGNOSIS — Z954 Presence of other heart-valve replacement: Secondary | ICD-10-CM | POA: Diagnosis not present

## 2018-03-21 DIAGNOSIS — Z48812 Encounter for surgical aftercare following surgery on the circulatory system: Secondary | ICD-10-CM | POA: Diagnosis not present

## 2018-03-25 DIAGNOSIS — Z954 Presence of other heart-valve replacement: Secondary | ICD-10-CM | POA: Diagnosis not present

## 2018-03-25 DIAGNOSIS — Z48812 Encounter for surgical aftercare following surgery on the circulatory system: Secondary | ICD-10-CM | POA: Diagnosis not present

## 2018-03-28 ENCOUNTER — Ambulatory Visit (INDEPENDENT_AMBULATORY_CARE_PROVIDER_SITE_OTHER): Payer: Medicare Other

## 2018-03-28 DIAGNOSIS — Z7901 Long term (current) use of anticoagulants: Secondary | ICD-10-CM

## 2018-03-28 LAB — POCT INR: INR: 2.6 (ref 2.0–3.0)

## 2018-03-28 NOTE — Patient Instructions (Addendum)
Continue same dose of coumadin. 5 mg every day exept for tuesdays and saturdays take 1/2 tablet.   Come back in one month for nurse visit INR check.

## 2018-03-28 NOTE — Progress Notes (Signed)
Pt here for INR check per 2.0 -3.0 Goal INR =  Last INR =02-21-18 2.2  Pt currently takes Coumadin 5 mg  Pt denies recent antibiotics, no dietary changes and no unusual bruising / bleeding.  INR today = 2.6  Pt advised per Dr. Charlett Blake ok to continue same dose of coumadin and come back in 1 month for INR check with the nurse.

## 2018-04-01 DIAGNOSIS — Z954 Presence of other heart-valve replacement: Secondary | ICD-10-CM | POA: Diagnosis not present

## 2018-04-01 DIAGNOSIS — Z48812 Encounter for surgical aftercare following surgery on the circulatory system: Secondary | ICD-10-CM | POA: Diagnosis not present

## 2018-04-02 ENCOUNTER — Other Ambulatory Visit: Payer: Self-pay | Admitting: Family Medicine

## 2018-04-02 NOTE — Telephone Encounter (Signed)
Requesting:tramadol Contract:no UDS:no Last OV:02/21/18 Next OV:06/27/18 Last Refill:10/18/17  #[id number] Database:   Please advise

## 2018-04-04 ENCOUNTER — Telehealth: Payer: Self-pay | Admitting: Cardiology

## 2018-04-04 DIAGNOSIS — Z954 Presence of other heart-valve replacement: Secondary | ICD-10-CM | POA: Diagnosis not present

## 2018-04-04 DIAGNOSIS — Z48812 Encounter for surgical aftercare following surgery on the circulatory system: Secondary | ICD-10-CM | POA: Diagnosis not present

## 2018-04-04 NOTE — Telephone Encounter (Signed)
Called patient. She states that she has not heard back from her message regarding her HR. Patient states right now her HR was 48, BP at rehab was 117/78. Spoke with PharmD who states that she can cut back on the Metoprolol to twice daily, and take 0.5 tablet ONLY if she has palpitations. Patient verbalized understanding, advised to keep diary of BP and HR, and to call with concerns.

## 2018-04-04 NOTE — Telephone Encounter (Signed)
S/w Barnett Applebaum, nurse from [facility] she states that pt is there for cardiac rehab and pt's HR is 39 it usually runs in the low 50's and she has tried to get it to go up and her exertional HR is 45. She is currently taking metoprolol 25mg  TID. She states that her BP is 116/68 and her EKG is fine she has a regular P wave and QRS she does have a small U wave but this is consistent with the past EKG's.sge will keep an eye on her during her rehab. What would you like to do? Appt? Adjust medication? Please advise

## 2018-04-04 NOTE — Telephone Encounter (Signed)
OK to stop beta blocker.

## 2018-04-04 NOTE — Telephone Encounter (Signed)
New Message        STAT if HR is under 50 or over 120 (normal HR is 60-100 beats per minute)  1) What is your heart rate? 39  2) Do you have a log of your heart rate readings (document readings)? No  3) Do you have any other symptoms? No           This is the patient 3rd called and no one has called back yet.

## 2018-04-04 NOTE — Telephone Encounter (Signed)
Barnett Applebaum, nurse from [facility] called wanting to speak to nurse. Patient has HR 39

## 2018-04-05 NOTE — Telephone Encounter (Signed)
Call and spoke with pt to give Dr Percival Spanish respond, pt stated she have been have some racing of her heart this morning, spoke with Dr Percival Spanish about this who advised to take Metoprolol as needed. Pt voice understanding and thanks.

## 2018-04-08 DIAGNOSIS — Z954 Presence of other heart-valve replacement: Secondary | ICD-10-CM | POA: Diagnosis not present

## 2018-04-08 DIAGNOSIS — Z48812 Encounter for surgical aftercare following surgery on the circulatory system: Secondary | ICD-10-CM | POA: Diagnosis not present

## 2018-04-15 DIAGNOSIS — Z48812 Encounter for surgical aftercare following surgery on the circulatory system: Secondary | ICD-10-CM | POA: Diagnosis not present

## 2018-04-15 DIAGNOSIS — Z954 Presence of other heart-valve replacement: Secondary | ICD-10-CM | POA: Diagnosis not present

## 2018-04-17 ENCOUNTER — Ambulatory Visit: Payer: Medicare Other | Admitting: Cardiology

## 2018-04-18 DIAGNOSIS — Z954 Presence of other heart-valve replacement: Secondary | ICD-10-CM | POA: Diagnosis not present

## 2018-04-18 DIAGNOSIS — Z48812 Encounter for surgical aftercare following surgery on the circulatory system: Secondary | ICD-10-CM | POA: Diagnosis not present

## 2018-04-24 ENCOUNTER — Ambulatory Visit: Payer: Medicare Other

## 2018-04-26 ENCOUNTER — Ambulatory Visit: Payer: Medicare Other

## 2018-05-09 ENCOUNTER — Telehealth: Payer: Self-pay | Admitting: Family Medicine

## 2018-05-09 NOTE — Telephone Encounter (Signed)
Copied from [place] [id number]. Topic: Quick Communication - See Telephone Encounter >> May 09, 2018  4:56 PM Blase Mess A wrote: CRM for notification. See Telephone encounter for: 05/09/18.  Patient is calling because had surgery on 11/2018 she was placed on 81 mg asprin and she wants to know does Dr.  Charlett Blake want to adjust her coumadin?  She comes in for an INR check on 05/14/18. Because she was told to stop the 81 mg on 05/15/18. The patient wants to know can she be placed on a machine check her INR versus coming into the office?  Can an order be placed for this?  Her brother in law in [place] has one.  Please advise. [phone]

## 2018-05-11 NOTE — Telephone Encounter (Signed)
OK to order her a machine for her INR. Try [facility] and see if they can tell us how to order the machine.

## 2018-05-14 ENCOUNTER — Other Ambulatory Visit: Payer: Self-pay

## 2018-05-14 ENCOUNTER — Other Ambulatory Visit (INDEPENDENT_AMBULATORY_CARE_PROVIDER_SITE_OTHER): Payer: Medicare Other

## 2018-05-14 ENCOUNTER — Ambulatory Visit: Payer: Medicare Other

## 2018-05-14 DIAGNOSIS — Z0000 Encounter for general adult medical examination without abnormal findings: Secondary | ICD-10-CM

## 2018-05-14 DIAGNOSIS — I480 Paroxysmal atrial fibrillation: Secondary | ICD-10-CM

## 2018-05-14 DIAGNOSIS — Z7901 Long term (current) use of anticoagulants: Secondary | ICD-10-CM

## 2018-05-14 LAB — PROTIME-INR
INR: 2.9 ratio — ABNORMAL HIGH (ref 0.8–1.0)
Prothrombin Time: 33.5 s — ABNORMAL HIGH (ref 9.6–13.1)

## 2018-05-14 NOTE — Telephone Encounter (Signed)
INR machine ordered

## 2018-05-14 NOTE — Progress Notes (Unsigned)
Pre visit review using our clinic tool,if applicable. No additional management support is needed unless otherwise documented below in the visit note.   Pt here for INR check per order from Dr. Charlett Blake..  Goal INR = 2.0-3.0  Last INR =2.6  Pt currently takes Coumadin  5 mg daily 2.5 on Tuesday and Saturday.  Pt denies recent antibiotics, no dietary changes and no unusual bruising / bleeding. States she willl take her last 81 mg Aspirin tonight.  INR checked by lab today. INR machine not working properly. Faxed application for Home INR checks to [facility]

## 2018-05-14 NOTE — Progress Notes (Unsigned)
Patient came in for INR xcheck today. States she will take last 81 mg Aspirin tomorrow. Would like to know if she needs to make adjustments to Coumadin. Currently taking 5 mg daily except got 2.5 on Tuesday and Saturday.

## 2018-05-15 NOTE — Telephone Encounter (Signed)
Perfect thanks

## 2018-05-15 NOTE — Telephone Encounter (Signed)
Faxed documentation/form to [facility]. Not sure at this point if they supply INR machine since she will be having INRchecked   by them. Will place follow up call today.

## 2018-05-15 NOTE — Telephone Encounter (Signed)
Spoke with representative from [facility]. States they will contact patients insurance company to get verification of coverage which takes 3-5 days. They will then contact patient to schedule appointment for training and ship meter to local [place]. Stated the whole process takes about 7-10 days. Patient notified.

## 2018-05-24 ENCOUNTER — Telehealth: Payer: Self-pay | Admitting: Cardiology

## 2018-05-24 NOTE — Telephone Encounter (Signed)
Pt reports having increase in episodes of her AFIB and her heart racing... she says she takes a metoprolol when it starts and it helps in about 30 minutes... her main complaint is that she cannot do some of her normal activities without her heart starting so she feels it is becoming a problem and she feels very anxious about it... she says her BP has been normal although she does not have any readings to give me.. I advised her to rest and to continue to use her metoprolol as she needs it and I will let Dr. Percival Spanish know if these changes.. Pt agreed.

## 2018-05-24 NOTE — Telephone Encounter (Signed)
We will set her up for a video visit on Monday

## 2018-05-24 NOTE — Telephone Encounter (Signed)
YOUR CARDIOLOGY TEAM HAS ARRANGED FOR AN E-VISIT FOR YOUR APPOINTMENT - PLEASE REVIEW IMPORTANT INFORMATION BELOW SEVERAL DAYS PRIOR TO YOUR APPOINTMENT  Due to the recent COVID-19 pandemic, we are transitioning in-person office visits to tele-medicine visits in an effort to decrease unnecessary exposure to our patients, their families, and staff. Medicare and most insurances are covering these visits without a copay needed. We also encourage you to sign up for MyChart if you have not already done so. You will need a smartphone if possible. For patients that do not have this, we can still complete the visit using a regular telephone but do prefer a smartphone to enable video when possible. You may have a family member that lives with you that can help. If possible, we also ask that you have a blood pressure cuff and scale at home to measure your blood pressure, heart rate and weight prior to your scheduled appointment. Patients with clinical needs that need an in-person evaluation and testing will still be able to come to the office if absolutely necessary. If you have any questions, feel free to call our office.     YOUR PROVIDER WILL BE USING THE FOLLOWING PLATFORM TO COMPLETE YOUR VISIT:Doxynme  . IF USING WEBEX - How to Download the WebEx App to Your SmartPhone  - If Apple device, go to [employer] and type in WebEx in the search bar. [place] [employer], the blue/green circle. If Android, go to [employer] and type in [employer] in the search bar. The app is free but as with any other app download, your phone may require you to verify saved payment information or Apple/Android password.  - You do NOT have to create an account. - On the day of the visit, our staff will walk you through joining the meeting with the meeting number/password.  Mikayla [name] USING MYCHART - How to Download the MyChart App to Your SmartPhone   - If Apple, go to [employer] and type in MyChart in the search bar and  download the app. If Android, ask patient to go to [employer] and type in [place] in the search bar and download the app. The app is free but as with any other app downloads, your phone may require you to verify saved payment information or Apple/Android password.  - You will need to then log into the app with your MyChart username and password, and select [facility] as your healthcare provider to link the account. When it is time for your visit, go to the MyChart app, find appointments, and click Begin Video Visit. Be sure to Select Allow for your device to access the Microphone and Camera for your visit. You will then be connected, and your provider will be with you shortly.  **If you have any issues connecting or need assistance, please contact MyChart service desk [phone])**  **If using a computer, in order to ensure the best quality for your visit, you will need to use either of the following Internet Browsers: [employer], or [employer]**  . IF USING DOXIMITY or DOXY.ME - The staff will give you instructions on receiving your link to join the meeting the day of your visit.      2-3 DAYS BEFORE YOUR APPOINTMENT  You will receive a telephone call from one of our [facility] team members - your caller ID may say "Unknown caller." If this is a video visit, we will walk you through how to set up your  device to be able to complete the visit. We will remind you check your blood pressure, heart rate and weight prior to your scheduled appointment. If you have an Apple Watch or Kardia, please upload any pertinent ECG strips the day before or morning of your appointment to [facility]. Our staff will also make sure you have reviewed the consent and agree to move forward with your scheduled tele-health visit.     THE DAY OF YOUR APPOINTMENT  Approximately 15 minutes prior to your scheduled appointment, you will receive a telephone call from one of [facility] team - your caller  ID may say "Unknown caller."  Our staff will confirm medications, vital signs for the day and any symptoms you may be experiencing. Please have this information available prior to the time of visit start. It may also be helpful for you to have a pad of paper and pen handy for any instructions given during your visit. They will also walk you through joining the smartphone meeting if this is a video visit.    CONSENT FOR TELE-HEALTH VISIT - PLEASE REVIEW  I hereby voluntarily request, consent and authorize [facility] and its employed or contracted physicians, physician assistants, nurse practitioners or other licensed health care professionals (the Practitioner), to provide me with telemedicine health care services (the "Services") as deemed necessary by the treating Practitioner. I acknowledge and consent to receive the Services by the Practitioner via telemedicine. I understand that the telemedicine visit will involve communicating with the Practitioner through live audiovisual communication technology and the disclosure of certain medical information by electronic transmission. I acknowledge that I have been given the opportunity to request an in-person assessment or other available alternative prior to the telemedicine visit and am voluntarily participating in the telemedicine visit.  I understand that I have the right to withhold or withdraw my consent to the use of telemedicine in the course of my care at any time, without affecting my right to future care or treatment, and that the Practitioner or I may terminate the telemedicine visit at any time. I understand that I have the right to inspect all information obtained and/or recorded in the course of the telemedicine visit and may receive copies of available information for a reasonable fee.  I understand that some of the potential risks of receiving the Services via telemedicine include:  Marland Kitchen Delay or interruption in medical evaluation due to  technological equipment failure or disruption; . Information transmitted may not be sufficient (e.g. poor resolution of images) to allow for appropriate medical decision making by the Practitioner; and/or  . In rare instances, security protocols could fail, causing a breach of personal health information.  Furthermore, I acknowledge that it is my responsibility to provide information about my medical history, conditions and care that is complete and accurate to the best of my ability. I acknowledge that Practitioner's advice, recommendations, and/or decision may be based on factors not within their control, such as incomplete or inaccurate data provided by me or distortions of diagnostic images or specimens that may result from electronic transmissions. I understand that the practice of medicine is not an exact science and that Practitioner makes no warranties or guarantees regarding treatment outcomes. I acknowledge that I will receive a copy of this consent concurrently upon execution via email to the email address I last provided but may also request a printed copy by calling the office of [facility].    I understand that my insurance will be billed for this visit.  I have read or had this consent read to me. . I understand the contents of this consent, which adequately explains the benefits and risks of the Services being provided via telemedicine.  . I have been provided ample opportunity to ask questions regarding this consent and the Services and have had my questions answered to my satisfaction. . I give my informed consent for the services to be provided through the use of telemedicine in my medical care  By participating in this telemedicine visit I agree to the above.

## 2018-05-24 NOTE — Telephone Encounter (Signed)
Pt has a hx of afib.  Pt was on metoprolol 3x a day, but had a valve replacement in October, and was told to take metoprolol as needed. At this time, she was put on chlorthalidone 1x daily   Pt says her heart races so fast it feels like it is racing out of her chest. This seems to happen every time she stands up or has mild exertion. She is worried because she has had to take her meds as needed 8x this month, but in March it was only 7x for the whole month The event this morning made her most nervous. She needs to see or talk with Dr. Percival Spanish before her appt

## 2018-05-24 NOTE — Telephone Encounter (Signed)
Home phone(video on daughters phone)/ decline my chart/ virtual consent/ pre reg completed
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
## 2018-05-28 ENCOUNTER — Encounter: Payer: Self-pay | Admitting: Cardiology

## 2018-05-28 ENCOUNTER — Telehealth (INDEPENDENT_AMBULATORY_CARE_PROVIDER_SITE_OTHER): Payer: Medicare Other | Admitting: Cardiology

## 2018-05-28 VITALS — BP 102/73 | HR 68 | Ht 65.0 in | Wt 163.8 lb

## 2018-05-28 DIAGNOSIS — Z7189 Other specified counseling: Secondary | ICD-10-CM

## 2018-05-28 DIAGNOSIS — Z952 Presence of prosthetic heart valve: Secondary | ICD-10-CM

## 2018-05-28 DIAGNOSIS — I5032 Chronic diastolic (congestive) heart failure: Secondary | ICD-10-CM

## 2018-05-28 DIAGNOSIS — I480 Paroxysmal atrial fibrillation: Secondary | ICD-10-CM | POA: Diagnosis not present

## 2018-05-28 NOTE — Patient Instructions (Addendum)
Medication Instructions:  Continue current medications  If you need a refill on your cardiac medications before your next appointment, please call your pharmacy.  Labwork: None Ordered   Testing/Procedures: None Ordered   Follow-Up: . Your physician recommends that you schedule a follow-up appointment in: 1 Month   At [facility], you and your health needs are our priority.  As part of our continuing mission to provide you with exceptional heart care, we have created designated Provider Care Teams.  These Care Teams include your primary Cardiologist (physician) and Advanced Practice Providers (APPs -  Physician Assistants and Nurse Practitioners) who all work together to provide you with the care you need, when you need it.  Thank you for choosing [facility] at [facility]!!

## 2018-05-30 DIAGNOSIS — I480 Paroxysmal atrial fibrillation: Secondary | ICD-10-CM | POA: Diagnosis not present

## 2018-05-30 DIAGNOSIS — Z86718 Personal history of other venous thrombosis and embolism: Secondary | ICD-10-CM | POA: Diagnosis not present

## 2018-05-30 DIAGNOSIS — Z7901 Long term (current) use of anticoagulants: Secondary | ICD-10-CM | POA: Diagnosis not present

## 2018-06-03 ENCOUNTER — Telehealth: Payer: Self-pay | Admitting: *Deleted

## 2018-06-03 NOTE — Telephone Encounter (Signed)
Received PT/INR results from [facility] PT/INR Self Testing Service; forwarded to provider/SLS 04/27

## 2018-06-07 ENCOUNTER — Telehealth: Payer: Self-pay | Admitting: *Deleted

## 2018-06-07 NOTE — Telephone Encounter (Signed)
Received PT/INR results from [facility] PT/INR Self Testing Service; forwarded to provider/SLS 05/01

## 2018-06-14 ENCOUNTER — Telehealth: Payer: Self-pay | Admitting: *Deleted

## 2018-06-14 NOTE — Telephone Encounter (Signed)
Received PT/INR results from [place]; forwarded to provider/SLS 05/08

## 2018-06-18 ENCOUNTER — Other Ambulatory Visit: Payer: Self-pay

## 2018-06-18 ENCOUNTER — Ambulatory Visit (INDEPENDENT_AMBULATORY_CARE_PROVIDER_SITE_OTHER): Payer: Medicare Other | Admitting: Family Medicine

## 2018-06-18 DIAGNOSIS — I480 Paroxysmal atrial fibrillation: Secondary | ICD-10-CM | POA: Diagnosis not present

## 2018-06-18 DIAGNOSIS — I10 Essential (primary) hypertension: Secondary | ICD-10-CM | POA: Diagnosis not present

## 2018-06-18 DIAGNOSIS — I509 Heart failure, unspecified: Secondary | ICD-10-CM

## 2018-06-18 NOTE — Assessment & Plan Note (Signed)
Restart Diltiazem qhs, restart 1/2 tab Metoprolol in am prn and can take second 1/2 as needed. Stop Chlorthalidone for now. Discuss next week

## 2018-06-18 NOTE — Assessment & Plan Note (Signed)
Having more episodes of palpitations since cutting down on Metoprolol and starting Chlorthalidone so she stopped the Diltiazem and restarted the Metoprolol and the palpitations and fatigue continues her blood pressure continues to run low with systolic BP raning from 75-051/ diastolic 07-12. Will have her stop the Chlorthalidone. Restart the Diltiazem and move to qhs. Split her Metoprolol 25 mg tabs and if palpitations continue then start back 1/2 tab in am and use the 1/2 prn palpitations. Discuss results next week or as needed.

## 2018-06-18 NOTE — Assessment & Plan Note (Signed)
No recent trouble with fluid overload. May use Lasix prn
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
## 2018-06-20 DIAGNOSIS — Z86718 Personal history of other venous thrombosis and embolism: Secondary | ICD-10-CM | POA: Diagnosis not present

## 2018-06-20 DIAGNOSIS — I480 Paroxysmal atrial fibrillation: Secondary | ICD-10-CM | POA: Diagnosis not present

## 2018-06-20 DIAGNOSIS — Z7901 Long term (current) use of anticoagulants: Secondary | ICD-10-CM | POA: Diagnosis not present

## 2018-06-27 ENCOUNTER — Encounter: Payer: Self-pay | Admitting: Family Medicine

## 2018-06-27 ENCOUNTER — Ambulatory Visit (INDEPENDENT_AMBULATORY_CARE_PROVIDER_SITE_OTHER): Payer: Medicare Other | Admitting: Family Medicine

## 2018-06-27 ENCOUNTER — Other Ambulatory Visit: Payer: Self-pay

## 2018-06-27 DIAGNOSIS — I10 Essential (primary) hypertension: Secondary | ICD-10-CM

## 2018-06-27 DIAGNOSIS — E785 Hyperlipidemia, unspecified: Secondary | ICD-10-CM | POA: Diagnosis not present

## 2018-06-27 DIAGNOSIS — K121 Other forms of stomatitis: Secondary | ICD-10-CM | POA: Diagnosis not present

## 2018-06-27 DIAGNOSIS — I509 Heart failure, unspecified: Secondary | ICD-10-CM

## 2018-06-27 MED ORDER — FAMCICLOVIR 500 MG PO TABS: 500.0000 mg | ORAL_TABLET | Freq: Two times a day (BID) | ORAL | 1 refills | Status: DC

## 2018-07-01 DIAGNOSIS — K121 Other forms of stomatitis: Secondary | ICD-10-CM | POA: Insufficient documentation

## 2018-07-01 NOTE — Assessment & Plan Note (Signed)
Asymptomatic. No increased Pedal Edema or SOB

## 2018-07-01 NOTE — Assessment & Plan Note (Signed)
Encouraged heart healthy diet, increase exercise, avoid trans fats, consider a krill oil cap daily 

## 2018-07-01 NOTE — Assessment & Plan Note (Signed)
Refill given on Famcyclovir. No sores at present but is out of refills.
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
## 2018-07-01 NOTE — Assessment & Plan Note (Signed)
She had not been taking her Metoprolol daily til the last couple of days when she began to have increased palpitations again so she restarted 1/2 tab daily. She will take 1/2 tab qd in am and a seoncd 1/2 tab daily as needed. She has follow up with cardiology soon and she will call with any concerns prior to that visit.

## 2018-07-05 ENCOUNTER — Telehealth: Payer: Self-pay

## 2018-07-05 ENCOUNTER — Telehealth (INDEPENDENT_AMBULATORY_CARE_PROVIDER_SITE_OTHER): Payer: Medicare Other | Admitting: Physician Assistant

## 2018-07-05 ENCOUNTER — Other Ambulatory Visit: Payer: Self-pay

## 2018-07-05 ENCOUNTER — Telehealth: Payer: Self-pay | Admitting: Cardiology

## 2018-07-05 ENCOUNTER — Encounter: Payer: Self-pay | Admitting: Physician Assistant

## 2018-07-05 VITALS — BP 116/79 | HR 71 | Ht 65.0 in | Wt 168.0 lb

## 2018-07-05 DIAGNOSIS — I480 Paroxysmal atrial fibrillation: Secondary | ICD-10-CM

## 2018-07-05 DIAGNOSIS — Z952 Presence of prosthetic heart valve: Secondary | ICD-10-CM

## 2018-07-05 DIAGNOSIS — R55 Syncope and collapse: Secondary | ICD-10-CM

## 2018-07-05 DIAGNOSIS — I10 Essential (primary) hypertension: Secondary | ICD-10-CM

## 2018-07-05 NOTE — Telephone Encounter (Signed)
Called patient on daughter's cell phone to discuss AVS instructions gave Mikayla [name] the recommendations and patient voiced understanding. AVS summary mailed to patient.
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
## 2018-07-05 NOTE — Patient Instructions (Addendum)
Medication Instructions:   Your physician recommends that you continue on your current medications as directed. Please refer to the Current Medication list given to you today.   If you need a refill on your cardiac medications before your next appointment, please call your pharmacy.   Lab work:  NONE ordered at this time of appointment .  If you have labs (blood work) drawn today and your tests are completely normal, you will receive your results only by: Marland Kitchen MyChart Message (if you have MyChart) OR . A paper copy in the mail If you have any lab test that is abnormal or we need to change your treatment, we will call you to review the results.  Testing/Procedures:  NONE ordered at this time of appointment    Follow-Up: At [facility], you and your health needs are our priority.  As part of our continuing mission to provide you with exceptional heart care, we have created designated Provider Care Teams.  These Care Teams include your primary Cardiologist (physician) and Advanced Practice Providers (APPs -  Physician Assistants and Nurse Practitioners) who all work together to provide you with the care you need, when you need it. You will need a follow up appointment in 6 weeks.  Please call our office 2 months in advance to schedule this appointment.  You may see Minus Breeding, MD or one of the following Advanced Practice Providers on your designated Care Team: [name], [place] . Fabian Sharp, PA-C  Any Other Special Instructions Will Be Listed Below (If Applicable).

## 2018-07-05 NOTE — Telephone Encounter (Signed)
Received a call from patient she stated she woke up this morning around 4:00 am with palpitations.She took Metolprolol 25 mg 1/2 tablet.She continued to have palpitations she took a whole tablet 25 mg at 6:45 am.Stated she also has felt faint and sob this morning.Patient is anxious wanting to know what to do.No chest pain.Virtual appointment scheduled with [name] PA this morning at 10:30 am.She gave permission for a virtual appointment.She wants Isaac Laud to call Cell # [phone].

## 2018-07-05 NOTE — Telephone Encounter (Signed)

## 2018-07-05 NOTE — Telephone Encounter (Signed)
New Message     Patient c/o Palpitations:  High priority if patient c/o lightheadedness, shortness of breath, or chest pain  1) How long have you had palpitations/irregular HR/ Afib? Are you having the symptoms now? Yes, every couple of minutes she feels like she is going to faint, She says her heart has been out of rhythm since 4am   2) Are you currently experiencing lightheadedness, SOB or CP? SOB and feeling tingly, she says she feels like she is going to pass out. She says if she calls 911 they will take her to [place], if she needs to go to the ER she would rather come to [place]   3) Do you have a history of afib (atrial fibrillation) or irregular heart rhythm? Yes  4) Have you checked your BP or HR? (document readings if available): BP 116/79 HR 71   5) Are you experiencing any other symptoms? She feels like passing out and doesn't feel good she feels like she is going to faint

## 2018-07-05 NOTE — Telephone Encounter (Signed)
Attempted to contact patient- phone rang x 6 times with no answer, no voicemail to leave message, and phone hangs up on its own.  Will try again.

## 2018-07-11 ENCOUNTER — Encounter (HOSPITAL_BASED_OUTPATIENT_CLINIC_OR_DEPARTMENT_OTHER): Payer: Self-pay

## 2018-07-11 ENCOUNTER — Emergency Department (HOSPITAL_BASED_OUTPATIENT_CLINIC_OR_DEPARTMENT_OTHER)
Admission: EM | Admit: 2018-07-11 | Discharge: 2018-07-11 | Disposition: A | Discharge status: Home / Self Care | Payer: Medicare Other | Attending: Emergency Medicine | Admitting: Emergency Medicine

## 2018-07-11 ENCOUNTER — Other Ambulatory Visit: Payer: Self-pay

## 2018-07-11 DIAGNOSIS — I110 Hypertensive heart disease with heart failure: Secondary | ICD-10-CM | POA: Insufficient documentation

## 2018-07-11 DIAGNOSIS — I509 Heart failure, unspecified: Secondary | ICD-10-CM | POA: Insufficient documentation

## 2018-07-11 DIAGNOSIS — Z7901 Long term (current) use of anticoagulants: Secondary | ICD-10-CM | POA: Insufficient documentation

## 2018-07-11 DIAGNOSIS — I480 Paroxysmal atrial fibrillation: Secondary | ICD-10-CM

## 2018-07-11 DIAGNOSIS — I2510 Atherosclerotic heart disease of native coronary artery without angina pectoris: Secondary | ICD-10-CM | POA: Insufficient documentation

## 2018-07-11 DIAGNOSIS — R002 Palpitations: Secondary | ICD-10-CM | POA: Diagnosis present

## 2018-07-11 DIAGNOSIS — Z79899 Other long term (current) drug therapy: Secondary | ICD-10-CM | POA: Diagnosis not present

## 2018-07-11 LAB — CBC WITH DIFFERENTIAL/PLATELET
Abs Immature Granulocytes: 0.02 10*3/uL (ref 0.00–0.07)
Basophils Absolute: 0.1 10*3/uL (ref 0.0–0.1)
Basophils Relative: 1 %
Eosinophils Absolute: 0.1 10*3/uL (ref 0.0–0.5)
Eosinophils Relative: 2 %
HCT: 40.5 % (ref 36.0–46.0)
Hemoglobin: 13 g/dL (ref 12.0–15.0)
Immature Granulocytes: 0 %
Lymphocytes Relative: 21 %
Lymphs Abs: 1.4 10*3/uL (ref 0.7–4.0)
MCH: 29.9 pg (ref 26.0–34.0)
MCHC: 32.1 g/dL (ref 30.0–36.0)
MCV: 93.1 fL (ref 80.0–100.0)
Monocytes Absolute: 0.6 10*3/uL (ref 0.1–1.0)
Monocytes Relative: 9 %
Neutro Abs: 4.4 10*3/uL (ref 1.7–7.7)
Neutrophils Relative %: 67 %
Platelets: 170 10*3/uL (ref 150–400)
RBC: 4.35 MIL/uL (ref 3.87–5.11)
RDW: 13.5 % (ref 11.5–15.5)
WBC: 6.5 10*3/uL (ref 4.0–10.5)
nRBC: 0 % (ref 0.0–0.2)

## 2018-07-11 LAB — BASIC METABOLIC PANEL
Anion gap: 8 (ref 5–15)
BUN: 15 mg/dL (ref 8–23)
CO2: 26 mmol/L (ref 22–32)
Calcium: 9.3 mg/dL (ref 8.9–10.3)
Chloride: 107 mmol/L (ref 98–111)
Creatinine, Ser: 0.52 mg/dL (ref 0.44–1.00)
GFR calc Af Amer: >60 mL/min (ref >60)
GFR calc non Af Amer: >60 mL/min (ref >60)
Glucose, Bld: 101 mg/dL — ABNORMAL HIGH (ref 70–99)
Potassium: 3.9 mmol/L (ref 3.5–5.1)
Sodium: 141 mmol/L (ref 135–145)

## 2018-07-11 LAB — TROPONIN I: Troponin I: <0.03 ng/mL (ref <0.03)

## 2018-07-11 NOTE — ED Notes (Signed)
Dr. Delo at bedside. 

## 2018-07-11 NOTE — ED Notes (Signed)
Pt. Reports she had an episode this morning  Around 7am that she felt her heart was racing.  She also reports she took her meds that helped calmed it.  Then at [place] today she started to have this again and felt she was going to black out so she is here now.

## 2018-07-11 NOTE — ED Triage Notes (Addendum)
Pt c/o irregular heart rate "all the time"-states this am she felt like she was going to pass out-denies feeling at present and denies CP-states she has hx of both and there are plans to put her on new med that reqiuires hospitalization-NAD-to triage in w/c
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
## 2018-07-11 NOTE — ED Notes (Signed)
ED Provider at bedside. 

## 2018-07-11 NOTE — Discharge Instructions (Signed)
Continue your medications as previously prescribed.  Follow-up with your primary doctor/cardiologist next week as scheduled.
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
## 2018-07-17 ENCOUNTER — Ambulatory Visit: Payer: Medicare Other | Admitting: Cardiology

## 2018-07-17 ENCOUNTER — Other Ambulatory Visit: Payer: Self-pay

## 2018-07-17 ENCOUNTER — Encounter: Payer: Self-pay | Admitting: Cardiology

## 2018-07-17 ENCOUNTER — Telehealth (INDEPENDENT_AMBULATORY_CARE_PROVIDER_SITE_OTHER): Payer: Medicare Other | Admitting: Cardiology

## 2018-07-17 VITALS — BP 119/64 | HR 55 | Ht 65.0 in | Wt 170.0 lb

## 2018-07-17 DIAGNOSIS — Z952 Presence of prosthetic heart valve: Secondary | ICD-10-CM

## 2018-07-17 DIAGNOSIS — I495 Sick sinus syndrome: Secondary | ICD-10-CM

## 2018-07-17 DIAGNOSIS — I480 Paroxysmal atrial fibrillation: Secondary | ICD-10-CM

## 2018-07-17 DIAGNOSIS — I10 Essential (primary) hypertension: Secondary | ICD-10-CM

## 2018-07-17 NOTE — Patient Instructions (Signed)
  Medication Instructions:  The current medical regimen is effective;  continue present plan and medications.  If you need a refill on your cardiac medications before your next appointment, please call your pharmacy.   Follow-Up: Follow up in 2 months with Dr Percival Spanish by virtual appointment.  Thank you for choosing [facility]!!

## 2018-07-18 ENCOUNTER — Telehealth: Payer: Self-pay | Admitting: Family Medicine

## 2018-07-18 ENCOUNTER — Telehealth (INDEPENDENT_AMBULATORY_CARE_PROVIDER_SITE_OTHER): Payer: Medicare Other | Admitting: Cardiology

## 2018-07-18 ENCOUNTER — Telehealth: Payer: Self-pay | Admitting: Pharmacist

## 2018-07-18 ENCOUNTER — Other Ambulatory Visit: Payer: Self-pay

## 2018-07-18 ENCOUNTER — Telehealth: Payer: Self-pay | Admitting: Cardiology

## 2018-07-18 DIAGNOSIS — Z86718 Personal history of other venous thrombosis and embolism: Secondary | ICD-10-CM | POA: Diagnosis not present

## 2018-07-18 DIAGNOSIS — Z7901 Long term (current) use of anticoagulants: Secondary | ICD-10-CM | POA: Diagnosis not present

## 2018-07-18 DIAGNOSIS — I480 Paroxysmal atrial fibrillation: Secondary | ICD-10-CM

## 2018-07-18 NOTE — Telephone Encounter (Signed)
Copied from [place] [id number]. Topic: General - Inquiry >> Jul 18, 2018  2:31 PM Mikayla [name] wrote: Reason for CRM: Pt called to inform Dr. Charlett Blake that she needs to be hospitalized for 3 days to be put on a new drug and to have her afib monitored and under control. The Pt's INR has been at 1.9 for the past few weeks and the hospital needs her INR to be between 2and 3 before they can hospitalize her. Pt would like DR. Charlett Blake to call her and advise her on what she should do to get her INR number up to where they want it to be/ please advise

## 2018-07-18 NOTE — Telephone Encounter (Signed)
Patient states she is returning [employer].

## 2018-07-18 NOTE — Telephone Encounter (Signed)
Reports Dofetilide is affordable. Pt aware I will forward this to our AFib clinic to arrange admission in next week/two, or ASAP. Aware they will contact her to arrange.
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
## 2018-07-18 NOTE — Telephone Encounter (Addendum)
Pt is taking Coumdain secondary to valve.  Reports INRs are normally therapeutic but the past 3 weeks she has been 1.9.  (she checks herself from home every Thursday). Pt advised to call PCP to discuss possible Coumadin dose change and diet. Pt aware [facility] will f/u with her week after next and determine when adx will be possible. Advised to call office b/t now and then if she has any issues r/t AFib. Pt agreeable to plan.

## 2018-07-18 NOTE — Telephone Encounter (Signed)
Read her phone note but basically her INR has to increase to 2-3 before they can admit her to the hospital and change her meds. So call her and confirm the doses of Coumadin she is taking each day and then I will increase her dosing

## 2018-07-18 NOTE — Telephone Encounter (Signed)
Medication list reviewed in anticipation of upcoming Tikosyn initiation. Patient is not taking any contraindicated or QTc prolonging medications.   Patient is anticoagulated on warfarin. She will need 4 weekly therapeutic INRs prior to Tikosyn initiation unless she undergoes TEE. Warfarin is managed by PCP.  Patient will need to be counseled to avoid use of Benadryl while on Tikosyn and in the 2-3 days prior to Tikosyn initiation.

## 2018-07-18 NOTE — Telephone Encounter (Signed)
Please advise 

## 2018-07-19 NOTE — Telephone Encounter (Signed)
Spoke with patient she stated she takes 5mg  daily and 1/2 tablet  on Tuesday and Saturday.  I spoke with Melissa she stated she wanted patient to drop the 1/2 tablet on Saturday and she should be between 2-3.  Patient notified and voiced her understanding

## 2018-07-20 NOTE — Telephone Encounter (Signed)
So we increased her Saturday dose to a full tab on Saturday right?

## 2018-07-21 ENCOUNTER — Other Ambulatory Visit: Payer: Self-pay | Admitting: Family Medicine

## 2018-07-22 NOTE — Telephone Encounter (Signed)
Yes she was to only take 1 whole tablet on Saturday

## 2018-08-07 ENCOUNTER — Other Ambulatory Visit (HOSPITAL_COMMUNITY): Payer: Self-pay | Admitting: *Deleted

## 2018-08-07 NOTE — Telephone Encounter (Signed)
Patient is scheduled for tikosyn on 7/14.  6/18 - INR 2.1 6/25 - INR 2.2 Pt will call me with INR tomorrow and 7/9. Covid testing set up for 7/10. Pt will call me if she has any further questions.

## 2018-08-13 DIAGNOSIS — H0100B Unspecified blepharitis left eye, upper and lower eyelids: Secondary | ICD-10-CM | POA: Diagnosis not present

## 2018-08-13 DIAGNOSIS — H0288B Meibomian gland dysfunction left eye, upper and lower eyelids: Secondary | ICD-10-CM | POA: Diagnosis not present

## 2018-08-13 DIAGNOSIS — H353131 Nonexudative age-related macular degeneration, bilateral, early dry stage: Secondary | ICD-10-CM | POA: Diagnosis not present

## 2018-08-13 DIAGNOSIS — H35363 Drusen (degenerative) of macula, bilateral: Secondary | ICD-10-CM | POA: Diagnosis not present

## 2018-08-13 DIAGNOSIS — H0288A Meibomian gland dysfunction right eye, upper and lower eyelids: Secondary | ICD-10-CM | POA: Diagnosis not present

## 2018-08-13 DIAGNOSIS — H0100A Unspecified blepharitis right eye, upper and lower eyelids: Secondary | ICD-10-CM | POA: Diagnosis not present

## 2018-08-13 DIAGNOSIS — H04123 Dry eye syndrome of bilateral lacrimal glands: Secondary | ICD-10-CM | POA: Diagnosis not present

## 2018-08-15 ENCOUNTER — Telehealth: Payer: Self-pay | Admitting: Family Medicine

## 2018-08-15 ENCOUNTER — Telehealth (HOSPITAL_COMMUNITY): Payer: Self-pay | Admitting: *Deleted

## 2018-08-15 ENCOUNTER — Other Ambulatory Visit (HOSPITAL_COMMUNITY): Payer: Self-pay | Admitting: *Deleted

## 2018-08-15 DIAGNOSIS — I4819 Other persistent atrial fibrillation: Secondary | ICD-10-CM

## 2018-08-15 DIAGNOSIS — Z86718 Personal history of other venous thrombosis and embolism: Secondary | ICD-10-CM | POA: Diagnosis not present

## 2018-08-15 DIAGNOSIS — Z7901 Long term (current) use of anticoagulants: Secondary | ICD-10-CM | POA: Diagnosis not present

## 2018-08-15 DIAGNOSIS — I480 Paroxysmal atrial fibrillation: Secondary | ICD-10-CM | POA: Diagnosis not present

## 2018-08-15 NOTE — Telephone Encounter (Signed)
Patient called in with INR of 1.7 today. Explained to patient for admit for tikosyn she will either have to start weekly INRs over for 4 weekly therapeutic INRs or TEE prior to admit. Patient will be in contact with PCP that manages coumadin if adjustments needed. She will think about option of waiting versus TEE and let me know which way she wants to proceed.

## 2018-08-15 NOTE — Telephone Encounter (Signed)
She was there for several weeks then it dropped again, check with her regarding if anything has changed. Confirm current dosing and then we can up dosing again

## 2018-08-15 NOTE — Telephone Encounter (Addendum)
Patient would like to know if her Coumidin can be increased because she took it this morning and it was 1.7.  She needs to get it between 2 and 3 for four weeks because her cardiologist want to put her in the hospital for 3 days. Please advise. [phone]

## 2018-08-15 NOTE — Telephone Encounter (Signed)
Please advise 

## 2018-08-15 NOTE — Telephone Encounter (Signed)
Pt would prefer TEE and proceed as planned next week as long as INR therapeutic day of planned admit. TEE with Dr. Marlou Porch on 7/14 @ 11am. Per Roderic Palau NP have pt check INR on Monday if therapeutic then plan to proceed.

## 2018-08-16 ENCOUNTER — Other Ambulatory Visit (HOSPITAL_COMMUNITY)
Admission: RE | Admit: 2018-08-16 | Discharge: 2018-08-16 | Disposition: A | Discharge status: Home / Self Care | Payer: Medicare Other | Source: Ambulatory Visit | Attending: Cardiology | Admitting: Cardiology

## 2018-08-16 ENCOUNTER — Telehealth (HOSPITAL_COMMUNITY): Payer: Self-pay | Admitting: *Deleted

## 2018-08-16 DIAGNOSIS — Z1159 Encounter for screening for other viral diseases: Secondary | ICD-10-CM | POA: Diagnosis not present

## 2018-08-16 DIAGNOSIS — Z01812 Encounter for preprocedural laboratory examination: Secondary | ICD-10-CM | POA: Insufficient documentation

## 2018-08-16 NOTE — Telephone Encounter (Signed)
Please advise 

## 2018-08-16 NOTE — Telephone Encounter (Signed)
Nothing has changed.  Pt take 5 mg daily, except for Tues. She takes 2.5. daughter states she goes into the hospital on Tuesday, so she cannot wait until Monday to adjust.  If it is not up, pt will be unable to do her procedure

## 2018-08-16 NOTE — Telephone Encounter (Signed)
Patient called to check the status when PCP or nurse to call her about her coumidin. Patient is upset about not getting a call back. Patient call back [phone]

## 2018-08-16 NOTE — Telephone Encounter (Signed)
Is there anyone that can address this issue?  Daughter states pt is scheduled to be in the hospital 3 days for her procedure, and will really be upset if she cannot because of her coum issue.

## 2018-08-16 NOTE — Telephone Encounter (Signed)
Per megan in coumadin clinic would increase coumadin to 7.5mg  for next 2 days then resume normal dosing.

## 2018-08-17 LAB — SARS CORONAVIRUS 2 (TAT 6-24 HRS): SARS Coronavirus 2: NEGATIVE

## 2018-08-18 NOTE — Telephone Encounter (Signed)
Recheck her INR on Monday early. She had been normal for several weeks so not sure what made it change

## 2018-08-19 ENCOUNTER — Telehealth (HOSPITAL_COMMUNITY): Payer: Self-pay | Admitting: *Deleted

## 2018-08-19 NOTE — Telephone Encounter (Signed)
Patient called in this AM with INR of 3.0 - she has decided she would like to avoid TEE and would prefer to continue with weekly INR checks for 4 weeks. Will arrange tikosyn admit after 4 weekly INRs.

## 2018-08-19 NOTE — Telephone Encounter (Signed)
Spoke with patient and let her know that cardiology has been following patients coumadin due to having surgery

## 2018-08-20 ENCOUNTER — Ambulatory Visit (HOSPITAL_COMMUNITY): Admission: RE | Admit: 2018-08-20 | Payer: Medicare Other | Source: Ambulatory Visit | Admitting: Internal Medicine

## 2018-08-20 ENCOUNTER — Ambulatory Visit (HOSPITAL_COMMUNITY): Payer: Medicare Other | Admitting: Nurse Practitioner

## 2018-08-20 ENCOUNTER — Inpatient Hospital Stay: Admission: RE | Admit: 2018-08-20 | Payer: Medicare Other | Source: Ambulatory Visit | Admitting: Cardiology

## 2018-08-20 SURGERY — ECHOCARDIOGRAM, TRANSESOPHAGEAL
Anesthesia: Moderate Sedation

## 2018-08-22 ENCOUNTER — Telehealth: Payer: Self-pay | Admitting: Family Medicine

## 2018-08-22 NOTE — Telephone Encounter (Signed)
Pt is needing a cll back to discuss her INR.  She is trying to get into [facility] and they will not accept her until her INR is 2-3  Please call back to advise

## 2018-08-23 ENCOUNTER — Telehealth: Payer: Self-pay | Admitting: Family Medicine

## 2018-08-23 DIAGNOSIS — I480 Paroxysmal atrial fibrillation: Secondary | ICD-10-CM

## 2018-08-23 NOTE — Telephone Encounter (Signed)
Received patients INR reading from home. INR 1.7.  Per Dr. Charlett Blake patient should take an extra 5mg  and split it into 2 doses.   Patient currently takes 5mg  daily except Tuesdays she takes 2.5mg ..  Patient was advised to take 2.5mg  on Saturday and 2.5mg  on Tuesday and recheck her Coumadin next week.  Patient also agreed with having a referral to coumadin clinic.    Dr. Charlett Blake I have tried to place a referral to coumadin clinic and it will not allow me to  Please advise

## 2018-08-25 ENCOUNTER — Other Ambulatory Visit: Payer: Self-pay | Admitting: Family Medicine

## 2018-08-25 DIAGNOSIS — Z952 Presence of prosthetic heart valve: Secondary | ICD-10-CM

## 2018-08-25 DIAGNOSIS — I480 Paroxysmal atrial fibrillation: Secondary | ICD-10-CM

## 2018-08-25 DIAGNOSIS — I509 Heart failure, unspecified: Secondary | ICD-10-CM

## 2018-08-25 DIAGNOSIS — I350 Nonrheumatic aortic (valve) stenosis: Secondary | ICD-10-CM

## 2018-08-25 DIAGNOSIS — I10 Essential (primary) hypertension: Secondary | ICD-10-CM

## 2018-08-26 NOTE — Telephone Encounter (Signed)
Spoke with patient about INR results

## 2018-08-27 ENCOUNTER — Ambulatory Visit (INDEPENDENT_AMBULATORY_CARE_PROVIDER_SITE_OTHER): Payer: Medicare Other | Admitting: Family Medicine

## 2018-08-27 ENCOUNTER — Other Ambulatory Visit: Payer: Self-pay

## 2018-08-27 DIAGNOSIS — I509 Heart failure, unspecified: Secondary | ICD-10-CM | POA: Diagnosis not present

## 2018-08-27 DIAGNOSIS — I495 Sick sinus syndrome: Secondary | ICD-10-CM

## 2018-08-27 DIAGNOSIS — I10 Essential (primary) hypertension: Secondary | ICD-10-CM | POA: Diagnosis not present

## 2018-08-27 DIAGNOSIS — I480 Paroxysmal atrial fibrillation: Secondary | ICD-10-CM

## 2018-08-27 DIAGNOSIS — E785 Hyperlipidemia, unspecified: Secondary | ICD-10-CM

## 2018-08-29 ENCOUNTER — Telehealth: Payer: Self-pay

## 2018-08-29 ENCOUNTER — Telehealth: Payer: Self-pay | Admitting: Pharmacist

## 2018-08-29 NOTE — Telephone Encounter (Signed)
-----   Message from Lutricia Feil, RN sent at 08/29/2018 12:11 PM EDT ----- Regarding: FW: coumadin Looks like pt already goes to [facility] if you could please follow for Coumadin management. Thank you! ----- Message ----- From: Juluis Mire, RN Sent: 08/29/2018  10:50 AM EDT To: [facility] Subject: coumadin                                       Pt would like to start being followed by coumadin clinic through [address]. She takes her INRs at home but has difficulty with timely responses with her PCP that is currently managing. We are in the process of weekly INRs for pending tikosyn admit. Her INR today was 3.3.  Please call her at [phone] to address. Thanks!Stacy RN [facility]

## 2018-08-29 NOTE — Telephone Encounter (Signed)
Patient received warfarin instructions from PCP today. She want to change warfarin management to [facility] but continue to use her POC machine.  Will start paperwork to transfer INR results from PCP to Dr Percival Spanish. Patient aware it may 1-2 weeks and needs to continue with PCP for now.

## 2018-08-29 NOTE — Telephone Encounter (Signed)
Patients INR results came in via fx. Per Dr. Charlett Blake patient needs to have her hold the coumadin today, then do not proceed with the increased dose of 7.5 on saturday. so have her take 5 mg daily except 7.5 mg on Tuesdays. recheck tomorrow if she is willing.  Spoke with patients daughter Venida Jarvis and let her know the above instructions. She stated she will let her mom know, she voiced her understanding. Patient was also made aware of the change to her INr results that will now be routed to Cardiology Coumadin clinic.

## 2018-08-29 NOTE — Telephone Encounter (Signed)
Spoke with INR home office they stated we just need to have a letter head stating where the new information needs to go for the patient. Patient has been referred to cardiology coumadin clinic. I have printed a form and fax with new information for patient.

## 2018-08-30 NOTE — Assessment & Plan Note (Signed)
Having more and more symptomatic episodes. They want her INR between 2 and 3 for a month so they can bring her in for procedure

## 2018-08-30 NOTE — Assessment & Plan Note (Signed)
Encouraged heart healthy diet, increase exercise, avoid trans fats, consider a krill oil cap daily 
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
## 2018-08-30 NOTE — Assessment & Plan Note (Signed)
No recent exacerbation 

## 2018-08-30 NOTE — Assessment & Plan Note (Addendum)
Having more and more symptomatic episodes. They want her INR between 2 and 3 for a month so they can bring her in for procedure. She checks her INR at home and she is going to start having coumadin clinic at cardiology follow her INR

## 2018-08-30 NOTE — Assessment & Plan Note (Signed)
Encouraged to check vitals weekly no changes to meds. Encouraged heart healthy diet such as the DASH diet and exercise as tolerated.  

## 2018-09-02 ENCOUNTER — Ambulatory Visit (INDEPENDENT_AMBULATORY_CARE_PROVIDER_SITE_OTHER): Payer: Medicare Other | Admitting: Pharmacist

## 2018-09-02 DIAGNOSIS — Z7901 Long term (current) use of anticoagulants: Secondary | ICD-10-CM | POA: Insufficient documentation

## 2018-09-02 LAB — POCT INR: INR: 2.3 (ref 2.0–3.0)

## 2018-09-09 ENCOUNTER — Ambulatory Visit (INDEPENDENT_AMBULATORY_CARE_PROVIDER_SITE_OTHER): Payer: Medicare Other | Admitting: Cardiology

## 2018-09-09 ENCOUNTER — Other Ambulatory Visit (HOSPITAL_COMMUNITY): Payer: Self-pay | Admitting: *Deleted

## 2018-09-09 DIAGNOSIS — I480 Paroxysmal atrial fibrillation: Secondary | ICD-10-CM | POA: Diagnosis not present

## 2018-09-09 DIAGNOSIS — Z7901 Long term (current) use of anticoagulants: Secondary | ICD-10-CM | POA: Diagnosis not present

## 2018-09-09 DIAGNOSIS — Z86718 Personal history of other venous thrombosis and embolism: Secondary | ICD-10-CM

## 2018-09-09 LAB — POCT INR: INR: 3.1 — AB (ref 2.0–3.0)

## 2018-09-16 ENCOUNTER — Ambulatory Visit (INDEPENDENT_AMBULATORY_CARE_PROVIDER_SITE_OTHER): Payer: Medicare Other | Admitting: Pharmacist

## 2018-09-16 DIAGNOSIS — Z7901 Long term (current) use of anticoagulants: Secondary | ICD-10-CM | POA: Diagnosis not present

## 2018-09-16 DIAGNOSIS — I480 Paroxysmal atrial fibrillation: Secondary | ICD-10-CM

## 2018-09-16 DIAGNOSIS — Z86718 Personal history of other venous thrombosis and embolism: Secondary | ICD-10-CM

## 2018-09-16 LAB — POCT INR: INR: 3.1 — AB (ref 2.0–3.0)

## 2018-09-19 ENCOUNTER — Other Ambulatory Visit (HOSPITAL_COMMUNITY)
Admission: RE | Admit: 2018-09-19 | Discharge: 2018-09-19 | Disposition: A | Discharge status: Home / Self Care | Payer: Medicare Other | Source: Ambulatory Visit | Attending: Internal Medicine | Admitting: Internal Medicine

## 2018-09-19 DIAGNOSIS — Z20828 Contact with and (suspected) exposure to other viral communicable diseases: Secondary | ICD-10-CM | POA: Insufficient documentation

## 2018-09-19 DIAGNOSIS — Z01812 Encounter for preprocedural laboratory examination: Secondary | ICD-10-CM | POA: Diagnosis not present

## 2018-09-19 LAB — SARS CORONAVIRUS 2 (TAT 6-24 HRS): SARS Coronavirus 2: NEGATIVE

## 2018-09-23 ENCOUNTER — Ambulatory Visit (HOSPITAL_COMMUNITY)
Admission: RE | Admit: 2018-09-23 | Discharge: 2018-09-23 | Disposition: A | Discharge status: Home / Self Care | Payer: Medicare Other | Source: Ambulatory Visit | Attending: Physician Assistant | Admitting: Physician Assistant

## 2018-09-23 ENCOUNTER — Other Ambulatory Visit: Payer: Self-pay

## 2018-09-23 ENCOUNTER — Encounter (HOSPITAL_COMMUNITY): Payer: Self-pay | Admitting: Physician Assistant

## 2018-09-23 ENCOUNTER — Inpatient Hospital Stay (HOSPITAL_COMMUNITY)
Admission: RE | Admit: 2018-09-23 | Discharge: 2018-09-27 | DRG: 310 | Disposition: A | Discharge status: Home / Self Care | Payer: Medicare Other | Source: Ambulatory Visit | Attending: Cardiology | Admitting: Cardiology

## 2018-09-23 ENCOUNTER — Ambulatory Visit (INDEPENDENT_AMBULATORY_CARE_PROVIDER_SITE_OTHER): Payer: Medicare Other | Admitting: Internal Medicine

## 2018-09-23 VITALS — BP 106/60 | HR 59 | Ht 65.0 in | Wt 171.4 lb

## 2018-09-23 DIAGNOSIS — I872 Venous insufficiency (chronic) (peripheral): Secondary | ICD-10-CM | POA: Diagnosis present

## 2018-09-23 DIAGNOSIS — I10 Essential (primary) hypertension: Secondary | ICD-10-CM | POA: Diagnosis not present

## 2018-09-23 DIAGNOSIS — Z952 Presence of prosthetic heart valve: Secondary | ICD-10-CM | POA: Diagnosis not present

## 2018-09-23 DIAGNOSIS — Z885 Allergy status to narcotic agent status: Secondary | ICD-10-CM

## 2018-09-23 DIAGNOSIS — I2510 Atherosclerotic heart disease of native coronary artery without angina pectoris: Secondary | ICD-10-CM | POA: Diagnosis present

## 2018-09-23 DIAGNOSIS — Z96642 Presence of left artificial hip joint: Secondary | ICD-10-CM | POA: Diagnosis not present

## 2018-09-23 DIAGNOSIS — Z803 Family history of malignant neoplasm of breast: Secondary | ICD-10-CM

## 2018-09-23 DIAGNOSIS — E119 Type 2 diabetes mellitus without complications: Secondary | ICD-10-CM | POA: Diagnosis present

## 2018-09-23 DIAGNOSIS — Z8249 Family history of ischemic heart disease and other diseases of the circulatory system: Secondary | ICD-10-CM

## 2018-09-23 DIAGNOSIS — Z888 Allergy status to other drugs, medicaments and biological substances status: Secondary | ICD-10-CM | POA: Diagnosis not present

## 2018-09-23 DIAGNOSIS — M1990 Unspecified osteoarthritis, unspecified site: Secondary | ICD-10-CM | POA: Diagnosis not present

## 2018-09-23 DIAGNOSIS — I480 Paroxysmal atrial fibrillation: Principal | ICD-10-CM | POA: Diagnosis present

## 2018-09-23 DIAGNOSIS — Z7901 Long term (current) use of anticoagulants: Secondary | ICD-10-CM | POA: Diagnosis not present

## 2018-09-23 DIAGNOSIS — K219 Gastro-esophageal reflux disease without esophagitis: Secondary | ICD-10-CM | POA: Diagnosis present

## 2018-09-23 DIAGNOSIS — Z86718 Personal history of other venous thrombosis and embolism: Secondary | ICD-10-CM

## 2018-09-23 DIAGNOSIS — E785 Hyperlipidemia, unspecified: Secondary | ICD-10-CM | POA: Diagnosis present

## 2018-09-23 DIAGNOSIS — I350 Nonrheumatic aortic (valve) stenosis: Secondary | ICD-10-CM | POA: Diagnosis not present

## 2018-09-23 LAB — BASIC METABOLIC PANEL
Anion gap: 8 (ref 5–15)
BUN: 16 mg/dL (ref 8–23)
CO2: 27 mmol/L (ref 22–32)
Calcium: 9.2 mg/dL (ref 8.9–10.3)
Chloride: 104 mmol/L (ref 98–111)
Creatinine, Ser: 0.73 mg/dL (ref 0.44–1.00)
GFR calc Af Amer: >60 mL/min (ref >60)
GFR calc non Af Amer: >60 mL/min (ref >60)
Glucose, Bld: 92 mg/dL (ref 70–99)
Potassium: 4.9 mmol/L (ref 3.5–5.1)
Sodium: 139 mmol/L (ref 135–145)

## 2018-09-23 LAB — POCT INR: INR: 3.2 — AB (ref 2.0–3.0)

## 2018-09-23 LAB — MAGNESIUM: Magnesium: 2.3 mg/dL (ref 1.7–2.4)

## 2018-09-23 MED ORDER — POLYETHYL GLYCOL-PROPYL GLYCOL 0.4-0.3 % OP SOLN: Freq: Two times a day (BID) | OPHTHALMIC | Status: DC

## 2018-09-23 MED ORDER — DILTIAZEM HCL ER COATED BEADS 240 MG PO CP24
240.0000 mg | ORAL_CAPSULE | Freq: Every evening | ORAL | Status: DC
  Administered 2018-09-23 – 2018-09-24 (×2): 240 mg via ORAL
  Filled 2018-09-23 (×2): qty 1

## 2018-09-23 MED ORDER — FISH OIL 1200 MG PO CAPS: 1200.0000 mg | ORAL_CAPSULE | Freq: Every day | ORAL | Status: DC

## 2018-09-23 MED ORDER — OCUVITE-LUTEIN PO TABS: 1.0000 | ORAL_TABLET | Freq: Every day | ORAL | Status: DC

## 2018-09-23 MED ORDER — PROSIGHT PO TABS: 1.0000 | ORAL_TABLET | Freq: Every day | ORAL | Status: DC

## 2018-09-23 MED ORDER — SODIUM CHLORIDE 0.9% FLUSH: 3.0000 mL | INTRAVENOUS | Status: DC | PRN

## 2018-09-23 MED ORDER — VITAMIN D3 25 MCG (1000 UNIT) PO TABS
1000.0000 [IU] | ORAL_TABLET | Freq: Every day | ORAL | Status: DC
  Filled 2018-09-23 (×3): qty 1

## 2018-09-23 MED ORDER — WARFARIN - PHARMACIST DOSING INPATIENT: Freq: Every day | Status: DC

## 2018-09-23 MED ORDER — METOPROLOL TARTRATE 25 MG PO TABS
12.5000 mg | ORAL_TABLET | Freq: Every day | ORAL | Status: DC
  Administered 2018-09-24 – 2018-09-25 (×2): 12.5 mg via ORAL
  Filled 2018-09-23 (×2): qty 1

## 2018-09-23 MED ORDER — ADULT MULTIVITAMIN W/MINERALS CH: 1.0000 | ORAL_TABLET | Freq: Every day | ORAL | Status: DC

## 2018-09-23 MED ORDER — DOFETILIDE 500 MCG PO CAPS
500.0000 ug | ORAL_CAPSULE | Freq: Two times a day (BID) | ORAL | Status: DC
  Administered 2018-09-23 – 2018-09-24 (×2): 500 ug via ORAL
  Filled 2018-09-23 (×2): qty 1

## 2018-09-23 MED ORDER — WARFARIN SODIUM 5 MG PO TABS: 5.0000 mg | ORAL_TABLET | Freq: Every day | ORAL | Status: DC

## 2018-09-23 MED ORDER — TRAMADOL HCL 50 MG PO TABS: 50.0000 mg | ORAL_TABLET | Freq: Two times a day (BID) | ORAL | Status: DC | PRN

## 2018-09-23 MED ORDER — CAL MAG ZINC +D3 PO TABS: ORAL_TABLET | Freq: Every day | ORAL | Status: DC

## 2018-09-23 MED ORDER — WARFARIN - PHYSICIAN DOSING INPATIENT: Freq: Every day | Status: DC

## 2018-09-23 MED ORDER — CULTURELLE DIGESTIVE HEALTH PO CAPS: 1.0000 | ORAL_CAPSULE | ORAL | Status: DC

## 2018-09-23 MED ORDER — PANTOPRAZOLE SODIUM 20 MG PO TBEC
80.0000 mg | DELAYED_RELEASE_TABLET | Freq: Every day | ORAL | Status: DC
  Administered 2018-09-24 – 2018-09-27 (×4): 80 mg via ORAL
  Filled 2018-09-23 (×4): qty 4

## 2018-09-23 MED ORDER — OMEGA-3-ACID ETHYL ESTERS 1 G PO CAPS
1.0000 g | ORAL_CAPSULE | Freq: Every day | ORAL | Status: DC
  Filled 2018-09-23: qty 1

## 2018-09-23 MED ORDER — BENEFIBER PO CHEW: CHEWABLE_TABLET | Freq: Every day | ORAL | Status: DC

## 2018-09-23 MED ORDER — SODIUM CHLORIDE 0.9% FLUSH
3.0000 mL | Freq: Two times a day (BID) | INTRAVENOUS | Status: DC
  Administered 2018-09-24 – 2018-09-26 (×2): 3 mL via INTRAVENOUS

## 2018-09-23 MED ORDER — RISAQUAD PO CAPS: 1.0000 | ORAL_CAPSULE | ORAL | Status: DC

## 2018-09-23 MED ORDER — POLYVINYL ALCOHOL 1.4 % OP SOLN
1.0000 [drp] | OPHTHALMIC | Status: DC | PRN
  Filled 2018-09-23: qty 15

## 2018-09-23 MED ORDER — GABAPENTIN 400 MG PO CAPS
800.0000 mg | ORAL_CAPSULE | Freq: Two times a day (BID) | ORAL | Status: DC
  Administered 2018-09-23 – 2018-09-24 (×2): 800 mg via ORAL
  Filled 2018-09-23 (×2): qty 2

## 2018-09-23 MED ORDER — ACETAMINOPHEN 500 MG PO TABS: 500.0000 mg | ORAL_TABLET | Freq: Three times a day (TID) | ORAL | Status: DC | PRN

## 2018-09-23 MED ORDER — SODIUM CHLORIDE 0.9 % IV SOLN: 250.0000 mL | INTRAVENOUS | Status: DC | PRN

## 2018-09-23 MED ORDER — PSYLLIUM 95 % PO PACK
1.0000 | PACK | Freq: Every day | ORAL | Status: DC
  Filled 2018-09-23 (×4): qty 1
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
## 2018-09-23 NOTE — Progress Notes (Signed)
Pharmacy Review for Dofetilide (Tikosyn) Initiation  Admit Complaint: 78 y.o. female admitted 09/23/2018 with atrial fibrillation to be initiated on dofetilide.   Assessment:  Patient Exclusion Criteria: If any screening criteria checked as "Yes", then  patient  should NOT receive dofetilide until criteria item is corrected. If "Yes" please indicate correction plan.  YES  NO Patient  Exclusion Criteria Correction Plan  []  [x]  Baseline QTc interval is greater than or equal to 440 msec. IF above YES box checked dofetilide contraindicated unless patient has ICD; then may proceed if QTc 500-550 msec or with known ventricular conduction abnormalities may proceed with QTc 550-600 msec. QTc =  439   []  [x]  Magnesium level is less than 1.8 mEq/l : Last magnesium:  Lab Results  Component Value Date   MG 2.3 09/23/2018         []  [x]  Potassium level is less than 4 mEq/l : Last potassium:  Lab Results  Component Value Date   K 4.9 09/23/2018         []  [x]  Patient is known or suspected to have a digoxin level greater than 2 ng/ml: No results found for: DIGOXIN    []  [x]  Creatinine clearance less than 20 ml/min (calculated using Cockcroft-Gault, actual body weight and serum creatinine): Estimated Creatinine Clearance: 58.8 mL/min (by C-G formula based on SCr of 0.73 mg/dL).    []  [x]  Patient has received drugs known to prolong the QT intervals within the last 48 hours (phenothiazines, tricyclics or tetracyclic antidepressants, erythromycin, H-1 antihistamines, cisapride, fluoroquinolones, azithromycin). Drugs not listed above may have an, as yet, undetected potential to prolong the QT interval, updated information on QT prolonging agents is available at this website:QT prolonging agents   []  [x]  Patient received a dose of hydrochlorothiazide (Oretic) alone or in any combination including triamterene (Dyazide, Maxzide) in the last 48 hours.   []  [x]  Patient received a medication known to  increase dofetilide plasma concentrations prior to initial dofetilide dose:  . Trimethoprim (Primsol, Proloprim) in the last 36 hours . Verapamil (Calan, Verelan) in the last 36 hours or a sustained release dose in the last 72 hours . Megestrol (Megace) in the last 5 days  . Cimetidine (Tagamet) in the last 6 hours . Ketoconazole (Nizoral) in the last 24 hours . Itraconazole (Sporanox) in the last 48 hours  . Prochlorperazine (Compazine) in the last 36 hours    []  [x]  Patient is known to have a history of torsades de pointes; congenital or acquired long QT syndromes.   []  [x]  Patient has received a Class 1 antiarrhythmic with less than 2 half-lives since last dose. (Disopyramide, Quinidine, Procainamide, Lidocaine, Mexiletine, Flecainide, Propafenone)   []  [x]  Patient has received amiodarone therapy in the past 3 months or amiodarone level is greater than 0.3 ng/ml.    Patient has been appropriately anticoagulated with Coumadin.  Ordering provider was confirmed at [email or web address] if they are not listed on the [place] Prescribers list.  Goal of Therapy: Follow renal function, electrolytes, potential drug interactions, and dose adjustment. Provide education and 1 week supply at discharge.  Plan:  [x]   Physician selected initial dose within range recommended for patients level of renal function - will monitor for response.  []   Physician selected initial dose outside of range recommended for patients level of renal function - will discuss if the dose should be altered at this time.   Select One Calculated CrCl  Dose q12h  [x]  > 60 ml/min 500 mcg  []   40-60 ml/min 250 mcg  []  20-40 ml/min 125 mcg   2. Follow up QTc after the first 5 doses, renal function, electrolytes (K & Mg) daily x 3     days, dose adjustment, success of initiation and facilitate 1 week discharge supply as     clinically indicated.  3. Initiate Tikosyn education video (Call [phone] and ask for Tikosyn Video  # 116).  4. Place Enrollment Form on the chart for discharge supply of dofetilide.   Marguerite Olea, [facility] Clinical Pharmacist Phone [phone]  09/23/2018 2:36 PM
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
## 2018-09-24 ENCOUNTER — Encounter (HOSPITAL_COMMUNITY): Payer: Self-pay

## 2018-09-24 ENCOUNTER — Other Ambulatory Visit: Payer: Self-pay | Admitting: Physician Assistant

## 2018-09-24 DIAGNOSIS — Z952 Presence of prosthetic heart valve: Secondary | ICD-10-CM

## 2018-09-24 LAB — BASIC METABOLIC PANEL
Anion gap: 8 (ref 5–15)
BUN: 22 mg/dL (ref 8–23)
CO2: 23 mmol/L (ref 22–32)
Calcium: 8.3 mg/dL — ABNORMAL LOW (ref 8.9–10.3)
Chloride: 107 mmol/L (ref 98–111)
Creatinine, Ser: 0.83 mg/dL (ref 0.44–1.00)
GFR calc Af Amer: >60 mL/min (ref >60)
GFR calc non Af Amer: >60 mL/min (ref >60)
Glucose, Bld: 148 mg/dL — ABNORMAL HIGH (ref 70–99)
Potassium: 3.9 mmol/L (ref 3.5–5.1)
Sodium: 138 mmol/L (ref 135–145)

## 2018-09-24 LAB — PROTIME-INR
INR: 3.8 — ABNORMAL HIGH (ref 0.8–1.2)
Prothrombin Time: 37 seconds — ABNORMAL HIGH (ref 11.4–15.2)

## 2018-09-24 LAB — MAGNESIUM: Magnesium: 1.8 mg/dL (ref 1.7–2.4)

## 2018-09-24 MED ORDER — POTASSIUM CHLORIDE CRYS ER 20 MEQ PO TBCR
30.0000 meq | EXTENDED_RELEASE_TABLET | Freq: Once | ORAL | Status: AC
  Administered 2018-09-24: 30 meq via ORAL
  Filled 2018-09-24: qty 1

## 2018-09-24 MED ORDER — GABAPENTIN 400 MG PO CAPS
800.0000 mg | ORAL_CAPSULE | Freq: Two times a day (BID) | ORAL | Status: DC
  Administered 2018-09-24 – 2018-09-27 (×6): 800 mg via ORAL
  Filled 2018-09-24 (×6): qty 2

## 2018-09-24 MED ORDER — HYOSCYAMINE SULFATE 0.125 MG SL SUBL
0.1250 mg | SUBLINGUAL_TABLET | SUBLINGUAL | Status: DC | PRN
  Administered 2018-09-24: 01:00:00 0.125 mg via SUBLINGUAL
  Filled 2018-09-24 (×2): qty 1

## 2018-09-24 MED ORDER — MAGNESIUM SULFATE IN D5W 1-5 GM/100ML-% IV SOLN
1.0000 g | Freq: Once | INTRAVENOUS | Status: AC
  Administered 2018-09-24: 08:00:00 1 g via INTRAVENOUS
  Filled 2018-09-24: qty 100

## 2018-09-24 NOTE — Progress Notes (Addendum)
Progress Note  Patient Name: Mikayla [name] Date of Encounter: 09/24/2018  Primary Cardiologist: Minus Breeding, MD   Subjective   Feels better this AM, no ongoing chills   Inpatient Medications    Scheduled Meds: . diltiazem  240 mg Oral QPM  . dofetilide  500 mcg Oral BID  . gabapentin  800 mg Oral BID  . metoprolol tartrate  12.5 mg Oral Daily  . pantoprazole  80 mg Oral Daily  . psyllium  1 packet Oral Daily  . sodium chloride flush  3 mL Intravenous Q12H  . warfarin  5 mg Oral q1800  . Warfarin - Pharmacist Dosing Inpatient   Does not apply q1800   Continuous Infusions: . sodium chloride     PRN Meds: sodium chloride, acetaminophen, hyoscyamine, polyvinyl alcohol, sodium chloride flush, traMADol   Vital Signs    Vitals:   09/23/18 1436 09/23/18 2030 09/24/18 0015 09/24/18 0705  BP: 140/60 (!) 152/64 (!) 147/82 (!) 128/50  Pulse:  66  84  Resp: 16 17  19   Temp: 98.4 F (36.9 C) (!) 97.5 F (36.4 C) 98.8 F (37.1 C) 99.1 F (37.3 C)  TempSrc: Oral Oral Oral Oral  SpO2: 97% 98% 99% 92%  Weight: 77.7 kg   76.3 kg  Height: 5\' 5"  (1.651 m)      No intake or output data in the 24 hours ending 09/24/18 6568 Last 3 Weights 09/24/2018 09/23/2018 09/23/2018  Weight (lbs) 168 lb 3.4 oz 171 lb 4.8 oz 171 lb 6.4 oz  Weight (kg) 76.3 kg 77.7 kg 77.747 kg      Telemetry    SR, had a couple hours last night with frequent PBVCs, though settled  - Personally Reviewed with Dr. Curt Bears  ECG    SR 65bpm, QT stable - Personally Reviewed with Dr. Curt Bears  Physical Exam   GEN: No acute distress.   Neck: No JVD Cardiac: RRR, no murmurs, rubs, or gallops.  Respiratory: CTA b/l. GI: Soft, nontender, non-distended  MS: No edema; No deformity. Neuro:  Nonfocal  Psych: Normal affect   Labs    High Sensitivity Troponin:  No results for input(s): TROPONINIHS in the last 720 hours.    Cardiac EnzymesNo results for input(s): TROPONINI in the last 168 hours. No results for  input(s): TROPIPOC in the last 168 hours.   Chemistry Recent Labs  Lab 09/23/18 1203 09/24/18 0343  NA 139 138  K 4.9 3.9  CL 104 107  CO2 27 23  GLUCOSE 92 148*  BUN 16 22  CREATININE 0.73 0.83  CALCIUM 9.2 8.3*  GFRNONAA >60 >60  GFRAA >60 >60  ANIONGAP 8 8     HematologyNo results for input(s): WBC, RBC, HGB, HCT, MCV, MCH, MCHC, RDW, PLT in the last 168 hours.  BNPNo results for input(s): BNP, PROBNP in the last 168 hours.   DDimer No results for input(s): DDIMER in the last 168 hours.   Radiology    No results found.  Cardiac Studies    12/21/27: TTE Study Conclusions - Left ventricle: The cavity size was normal. Wall thickness was   increased in a pattern of mild LVH. Systolic function was   vigorous. The estimated ejection fraction was in the range of 65%   to 70%. Wall motion was normal; there were no regional wall   motion abnormalities. Doppler parameters are consistent with   abnormal left ventricular relaxation (grade 1 diastolic   dysfunction). The E/e&' ratio is between 8-15, suggesting  indeterminate LV filling pressure. - Aortic valve: 26 mm Edwards Sapien 3 THV. Mild perivalvular leak,   no obstruction. Mean gradient (S): 12 mm Hg. Peak gradient (S):   28 mm Hg. Valve area (VTI): 1.85 cm^2. Valve area (Vmax): 1.48   cm^2. Valve area (Vmean): 1.7 cm^2. - Mitral valve: Mildly thickened leaflets . There was trivial   regurgitation. - Left atrium: The atrium was normal in size. - Tricuspid valve: There was mild regurgitation. - Pulmonary arteries: PA peak pressure: 36 mm Hg (S). - Inferior vena cava: The vessel was normal in size. The   respirophasic diameter changes were in the normal range (= 50%),   consistent with normal central venous pressure.  Impressions:  - Compared to a prior study in 11/2017, the findings are largely   unchanged except for mild perivalvular leak.   09/28/17: LHC  Ost 2nd Mrg lesion is 20% stenosed.  Ost LAD  to Prox LAD lesion is 20% stenosed.  Prox LAD lesion is 30% stenosed.  Ost LM to Mid LM lesion is 20% stenosed.  Ost 1st Diag lesion is 70% stenosed.   1. Mild non-obstructive CAD. The small caliber diagonal branch has a moderate stenosis, too small for PCI.  2. Severe aortic stenosis by echo findings. By cath mean gradient 26 mmHg, peak to peak gradient 21 mmHg, AVA 1.38 cm2  Patient Profile     78 y.o. 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 Paroxysmal Afib     CHA2DS2Vasc is 4, on warfarin (pharmacy management while here)     Tikosyn initiation in progress      K+ 3.9 (replaced)     Mag 1.8 (replaced     Creat 0.83 (stable)     INR 3.8     QTc stable   2. 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 HTN     No changes today  4. Chills (?) last night     No fever, pre-admit COVID neg     Pt dates these episodes back years, occurs randomly a few times a year     Has been rx Hyoscyamine by her PMD, unclear source of this symptom, though the hyoscyamine resolves them     She feels well this AM       For questions or updates, please contact [facility] Please consult [email or web address] for contact info under        Signed, Baldwin Jamaica, PA-C  09/24/2018, 9:22 AM    I have seen and examined this patient with Tommye Standard.  Agree with above, note added to reflect my findings.  On exam, RRR, no murmurs, lungs clear.  [place] for dofetilide loading.  QTC remained stable.  Continue at 500 mcg twice daily.  Ridhaan Dreibelbis M. Safi Culotta MD 09/24/2018 9:55 AM

## 2018-09-24 NOTE — Progress Notes (Addendum)
Post dose ekg is reviewed.  Significant QT prolongation noted I have reviewed her telemetry, remains in SR without arrhythmias, or significant ectopy. I have discontinued Tikosyn, no dose tonight, re-evaluate QT in the AM, if better, might consider lower dose  I will review EKG and plan with Dr. Gardenia Phlegm, PA-C  ADDENDUM Dr. Curt Bears reviewed ekg, agrees with plan, no Tikosyn tonight, will check EKG in AM I spoke with the patient  Tommye Standard, PA-C
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
## 2018-09-24 NOTE — TOC Benefit Eligibility Note (Signed)
Transition of Care [facility]) Benefit Eligibility Note    Patient Details  Name: Mikayla [name] MRN: [id number] Date of Birth: 25-Dec-1940   Medication/Dose: Tikosyn 125 mcg, 250 mcg, 500 mcg none are cover under the patient planno alternative given  Covered?: No        Spoke with Person/Company/Phone Number:: Thayer Headings  Co-Pay: none  Prior Approval: No          Orbie Pyo Phone Number: 09/24/2018, 3:31 PM

## 2018-09-24 NOTE — Progress Notes (Addendum)
Pt called this RN to room with complaints of "increased burping and chills all over". Pt afebrile 98.8 and looks to have what I would consider mild rigors.  Pt states "this occurs 2-3 times per year and nobody can figure out why". Pt denies CP/SOB, she states when these episodes occur they can last for up to several hours. Pt states PCP prescribed Hyoscyamine SL which helps with episodes. Pr Jeneen Rinks [facility], this med is ok to take with Tikosyn. Dr Eugene Garnet on call and notified of above with orders for PRN Hyoscyamine SL per home meds. Will continue to monitor. Jessie Foot, RN    09/24/2018 0145 Pt resting comfortably-above episode resolved after receiving PRN Hyoscyamine. Will continue to monitor. Jessie Foot, RN

## 2018-09-24 NOTE — Progress Notes (Signed)
Post Tikosyn EKG obtained - Qtc text paged to [place], [place] with EP. Patient stable, no ectopy noted.

## 2018-09-25 LAB — PROTIME-INR
INR: 2.9 — ABNORMAL HIGH (ref 0.8–1.2)
Prothrombin Time: 30 seconds — ABNORMAL HIGH (ref 11.4–15.2)

## 2018-09-25 LAB — BASIC METABOLIC PANEL
Anion gap: 6 (ref 5–15)
BUN: 14 mg/dL (ref 8–23)
CO2: 23 mmol/L (ref 22–32)
Calcium: 8 mg/dL — ABNORMAL LOW (ref 8.9–10.3)
Chloride: 111 mmol/L (ref 98–111)
Creatinine, Ser: 0.66 mg/dL (ref 0.44–1.00)
GFR calc Af Amer: >60 mL/min (ref >60)
GFR calc non Af Amer: >60 mL/min (ref >60)
Glucose, Bld: 110 mg/dL — ABNORMAL HIGH (ref 70–99)
Potassium: 4 mmol/L (ref 3.5–5.1)
Sodium: 140 mmol/L (ref 135–145)

## 2018-09-25 LAB — MAGNESIUM: Magnesium: 2.2 mg/dL (ref 1.7–2.4)

## 2018-09-25 MED ORDER — DOFETILIDE 250 MCG PO CAPS
250.0000 ug | ORAL_CAPSULE | Freq: Two times a day (BID) | ORAL | Status: DC
  Administered 2018-09-25 (×2): 250 ug via ORAL
  Filled 2018-09-25 (×2): qty 1

## 2018-09-25 MED ORDER — WARFARIN SODIUM 5 MG PO TABS
5.0000 mg | ORAL_TABLET | Freq: Once | ORAL | Status: AC
  Administered 2018-09-25: 17:00:00 5 mg via ORAL
  Filled 2018-09-25: qty 1

## 2018-09-25 NOTE — Progress Notes (Addendum)
Pt converted back to afib, rate low 100's. Patient asymtomatic. Notified Cardiology. No new orders at this time, will continue to monitor.

## 2018-09-25 NOTE — Progress Notes (Addendum)
Progress Note  Patient Name: Mikayla [name] Date of Encounter: 09/25/2018  Primary Cardiologist: Minus Breeding, MD   Subjective   Feels well, no symptoms.  Inpatient Medications    Scheduled Meds:  diltiazem  240 mg Oral QPM   dofetilide  250 mcg Oral BID   gabapentin  800 mg Oral BID   metoprolol tartrate  12.5 mg Oral Daily   pantoprazole  80 mg Oral Daily   psyllium  1 packet Oral Daily   sodium chloride flush  3 mL Intravenous Q12H   Warfarin - Pharmacist Dosing Inpatient   Does not apply q1800   Continuous Infusions:  sodium chloride     PRN Meds: sodium chloride, acetaminophen, hyoscyamine, polyvinyl alcohol, sodium chloride flush, traMADol   Vital Signs    Vitals:   09/24/18 0705 09/24/18 1519 09/24/18 2211 09/25/18 0718  BP: (!) 128/50 (!) 123/40 (!) 124/44 136/61  Pulse: 84 (!) 47 65 72  Resp: 19 19 20 20   Temp: 99.1 F (37.3 C) 98.7 F (37.1 C) 98.9 F (37.2 C) 97.9 F (36.6 C)  TempSrc: Oral Oral Oral Oral  SpO2: 92% 97% 97% 96%  Weight: 76.3 kg   77.6 kg  Height:        Intake/Output Summary (Last 24 hours) at 09/25/2018 0856 Last data filed at 09/24/2018 2211 Gross per 24 hour  Intake 699.72 ml  Output --  Net 699.72 ml   Last 3 Weights 09/25/2018 09/24/2018 09/23/2018  Weight (lbs) 171 lb 168 lb 3.4 oz 171 lb 4.8 oz  Weight (kg) 77.565 kg 76.3 kg 77.7 kg      Telemetry    SR/SB 50's-60's - Personally Reviewed with Dr. Curt Bears  ECG    SR 61bpm, QT much improved, QTc 467ms - Personally Reviewed with Dr. Curt Bears  Physical Exam   GEN: No acute distress.   Neck: No JVD Cardiac: RRR, no murmurs, rubs, or gallops.  Respiratory: CTA b/l. GI: Soft, nontender, non-distended  MS: No edema; No deformity. Neuro:  Nonfocal  Psych: Normal affect   Labs    High Sensitivity Troponin:  No results for input(s): TROPONINIHS in the last 720 hours.    Cardiac EnzymesNo results for input(s): TROPONINI in the last 168 hours. No results for  input(s): TROPIPOC in the last 168 hours.   Chemistry Recent Labs  Lab 09/23/18 1203 09/24/18 0343 09/25/18 0338  NA 139 138 140  K 4.9 3.9 4.0  CL 104 107 111  CO2 27 23 23   GLUCOSE 92 148* 110*  BUN 16 22 14   CREATININE 0.73 0.83 0.66  CALCIUM 9.2 8.3* 8.0*  GFRNONAA >60 >60 >60  GFRAA >60 >60 >60  ANIONGAP 8 8 6      HematologyNo results for input(s): WBC, RBC, HGB, HCT, MCV, MCH, MCHC, RDW, PLT in the last 168 hours.  BNPNo results for input(s): BNP, PROBNP in the last 168 hours.   DDimer No results for input(s): DDIMER in the last 168 hours.   Radiology    No results found.  Cardiac Studies    12/21/27: TTE Study Conclusions - Left ventricle: The cavity size was normal. Wall thickness was   increased in a pattern of mild LVH. Systolic function was   vigorous. The estimated ejection fraction was in the range of 65%   to 70%. Wall motion was normal; there were no regional wall   motion abnormalities. Doppler parameters are consistent with   abnormal left ventricular relaxation (grade 1 diastolic  dysfunction). The E/e&' ratio is between 8-15, suggesting   indeterminate LV filling pressure. - Aortic valve: 26 mm Edwards Sapien 3 THV. Mild perivalvular leak,   no obstruction. Mean gradient (S): 12 mm Hg. Peak gradient (S):   28 mm Hg. Valve area (VTI): 1.85 cm^2. Valve area (Vmax): 1.48   cm^2. Valve area (Vmean): 1.7 cm^2. - Mitral valve: Mildly thickened leaflets . There was trivial   regurgitation. - Left atrium: The atrium was normal in size. - Tricuspid valve: There was mild regurgitation. - Pulmonary arteries: PA peak pressure: 36 mm Hg (S). - Inferior vena cava: The vessel was normal in size. The   respirophasic diameter changes were in the normal range (= 50%),   consistent with normal central venous pressure.  Impressions:  - Compared to a prior study in 11/2017, the findings are largely   unchanged except for mild perivalvular  leak.   09/28/17: LHC  Ost 2nd Mrg lesion is 20% stenosed.  Ost LAD to Prox LAD lesion is 20% stenosed.  Prox LAD lesion is 30% stenosed.  Ost LM to Mid LM lesion is 20% stenosed.  Ost 1st Diag lesion is 70% stenosed.   1. Mild non-obstructive CAD. The small caliber diagonal branch has a moderate stenosis, too small for PCI.  2. Severe aortic stenosis by echo findings. By cath mean gradient 26 mmHg, peak to peak gradient 21 mmHg, AVA 1.38 cm2  Patient Profile     78 y.o. 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 Paroxysmal Afib     CHA2DS2Vasc is 4, on warfarin (pharmacy management while here)     Tikosyn initiation in progress      K+ 4.0     Mag 2.2     Creat 0.66 (stable)     INR 2.9      Significant QT prolongation yesterday after AM dose, none given last night.   EKG is reviewed with Dr. Curt Bears this AM, is much improved, Ellionna Buckbee give 278mcg this AM, follow closely   2. 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 HTN     No changes today  4. Chills (?) 8/17/evening     No fever, pre-admit COVID neg     Pt dates these episodes back years, occurs randomly a few times a year     Has been rx Hyoscyamine by her PMD, unclear source of this symptom, though the hyoscyamine resolves them     None further       For questions or updates, please contact [place] Please consult [email or web address] for contact info under        Signed, Baldwin Jamaica, PA-C  09/25/2018, 8:56 AM     I have seen and examined this patient with Tommye Standard.  Agree with above, note added to reflect my findings.  On exam, RRR, no murmurs, lungs clear. Tikosyn load. QTc prolonged and thus reduced the dose. ECG today after AM dose.    Stellar Gensel M. Maleyah Evans MD 09/25/2018 12:41 PM

## 2018-09-25 NOTE — TOC Benefit Eligibility Note (Signed)
Transition of Care [facility]) Benefit Eligibility Note    Patient Details  Name: Mikayla [name] MRN: [id number] Date of Birth: Feb 03, 1941   Medication/Dose: dofetilide .125 -dofetilide 250 mcg  -  dofetilide 551mcg--** there is no 120mg  for this medication**  Covered?: Yes  Tier: (no tier indicator)  Prescription Coverage Preferred Pharmacy: [facility], or any retail pharmacy  Spoke with Person/Company/Phone Number:: Merrilyn Puma - [facility] [phone]  Co-Pay: $7 FOR 34 DAY SUPPLY AT RETAIL PHARMACY OR $7 FOR 90 DAY SUPPY BY MAIL ORDER  Prior Approval: No  Deductible: Unmet  Additional Notes: brand name TIKOSYN  is not covered,    Tommy Medal Phone Number: 09/25/2018, 3:23 PM

## 2018-09-25 NOTE — TOC Initial Note (Deleted)
Transition of Care [facility]) - Initial/Assessment Note    Patient Details  Name: Mikayla [name] MRN: [id number] Date of Birth: Aug 11, 1940  Transition of Care [facility]) CM/SW Contact:    Tommy Medal Phone Number: 09/25/2018, 3:03 PM  Clinical Narrative:                         Patient Goals and CMS Choice        Expected Discharge Plan and Services                                                Prior Living Arrangements/Services                       Activities of Daily Living Home Assistive Devices/Equipment: Cane (specify quad or straight) ADL Screening (condition at time of admission) Patient's cognitive ability adequate to safely complete daily activities?: Yes Is the patient deaf or have difficulty hearing?: No Does the patient have difficulty seeing, even when wearing glasses/contacts?: No Does the patient have difficulty concentrating, remembering, or making decisions?: No Patient able to express need for assistance with ADLs?: Yes Does the patient have difficulty dressing or bathing?: No Independently performs ADLs?: Yes (appropriate for developmental age) Does the patient have difficulty walking or climbing stairs?: No Weakness of Legs: None Weakness of Arms/Hands: None  Permission Sought/Granted                  Emotional Assessment              Admission diagnosis:  A Fib Patient Active Problem List   Diagnosis Date Noted  . Paroxysmal atrial fibrillation ([place]) 09/23/2018  . Long term (current) use of anticoagulants 09/02/2018  . Tachycardia-bradycardia ([place]) 07/17/2018  . Oral ulcer 07/01/2018  . Educated About Covid-19 Virus Infection 05/28/2018  . CHF (congestive heart failure) ([place]) 11/13/2017  . 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 Hyperlipidemia   . Macular degeneration   . PAF (paroxysmal atrial fibrillation) ([place])   . Severe aortic stenosis   . Brain aneurysm 01/16/2015  . History of DVT  (deep vein thrombosis) 08/14/2014  . GERD (gastroesophageal reflux disease)   . Benign essential HTN 04/17/2006  . Chronic venous insufficiency 05/21/2002   PCP:  Mosie Lukes, MD Pharmacy:   [facility], [address] [place] [address] Phone: [phone] Fax: [phone]     Social Determinants of Health (SDOH) Interventions    Readmission Risk Interventions No flowsheet data found.

## 2018-09-25 NOTE — Progress Notes (Addendum)
ANTICOAGULATION CONSULT NOTE - Follow Up Consult  Pharmacy Consult for Coumadin Indication: atrial fibrillation  Allergies  Allergen Reactions  . Indomethacin Nausea And Vomiting and Other (See Comments)    Caused chest pain.  . Codeine Nausea And Vomiting    Patient Measurements: Height: 5\' 5"  (165.1 cm) Weight: 171 lb (77.6 kg) IBW/kg (Calculated) : 57  Vital Signs: Temp: 97.9 F (36.6 C) (08/19 0718) Temp Source: Oral (08/19 0718) BP: 136/61 (08/19 0718) Pulse Rate: 72 (08/19 0718)  Labs: Recent Labs    09/23/18 09/23/18 1203 09/24/18 0343 09/25/18 0338  LABPROT  --   --  37.0* 30.0*  INR 3.2*  --  3.8* 2.9*  CREATININE  --  0.73 0.83 0.66    Estimated Creatinine Clearance: 58.7 mL/min (by C-G formula based on SCr of 0.66 mg/dL).  Assessment:  78 yr old female admitted 8/17 for Tikosyn initiation. Tikosyn 500 mcg given 8/17 pm and 8/18 am, then held 8/18 pm for prolonged QTc, now improved.  Resumed this morning with 250 mcg BID.  K+ 4.0, mag 2.2.    Continues on Coumadin as prior to admission for atrial fibrillation. INR 3.2 on admit 8/17, then up to 3.8 yesterday and dose held.  INR down to 2.9 today, therapeutic. May trend down more.    PTA Coumadin: 5 mg daily  Goal of Therapy:  INR 2-3 Monitor platelets by anticoagulation protocol: Yes   Plan:    Coumadin 5 mg x 1 tonight.   Daily PT/INR for now.  CBC in am.   Follow electrolytes, QTc on Tikosyn.  Arty Baumgartner, [place] Pager: [phone] or phone: [phone] 09/25/2018,12:08 PM

## 2018-09-25 NOTE — Progress Notes (Signed)
Pt HR 39-45, asymptomatic. BP 127/49. Cont to monitor. Carroll Kinds RN

## 2018-09-26 LAB — PROTIME-INR
INR: 1.8 — ABNORMAL HIGH (ref 0.8–1.2)
Prothrombin Time: 20.6 seconds — ABNORMAL HIGH (ref 11.4–15.2)

## 2018-09-26 LAB — CBC
HCT: 39.3 % (ref 36.0–46.0)
Hemoglobin: 12.7 g/dL (ref 12.0–15.0)
MCH: 30.1 pg (ref 26.0–34.0)
MCHC: 32.3 g/dL (ref 30.0–36.0)
MCV: 93.1 fL (ref 80.0–100.0)
Platelets: 173 10*3/uL (ref 150–400)
RBC: 4.22 MIL/uL (ref 3.87–5.11)
RDW: 13.8 % (ref 11.5–15.5)
WBC: 7.6 10*3/uL (ref 4.0–10.5)
nRBC: 0 % (ref 0.0–0.2)

## 2018-09-26 LAB — BASIC METABOLIC PANEL
Anion gap: 9 (ref 5–15)
BUN: 13 mg/dL (ref 8–23)
CO2: 23 mmol/L (ref 22–32)
Calcium: 8.7 mg/dL — ABNORMAL LOW (ref 8.9–10.3)
Chloride: 109 mmol/L (ref 98–111)
Creatinine, Ser: 0.74 mg/dL (ref 0.44–1.00)
GFR calc Af Amer: >60 mL/min (ref >60)
GFR calc non Af Amer: >60 mL/min (ref >60)
Glucose, Bld: 105 mg/dL — ABNORMAL HIGH (ref 70–99)
Potassium: 4.1 mmol/L (ref 3.5–5.1)
Sodium: 141 mmol/L (ref 135–145)

## 2018-09-26 LAB — HEPARIN LEVEL (UNFRACTIONATED): Heparin Unfractionated: 0.42 IU/mL (ref 0.30–0.70)

## 2018-09-26 LAB — MAGNESIUM: Magnesium: 2 mg/dL (ref 1.7–2.4)

## 2018-09-26 MED ORDER — WARFARIN SODIUM 7.5 MG PO TABS
7.5000 mg | ORAL_TABLET | Freq: Once | ORAL | Status: AC
  Administered 2018-09-26: 18:00:00 7.5 mg via ORAL
  Filled 2018-09-26: qty 1

## 2018-09-26 MED ORDER — SODIUM CHLORIDE 0.9% FLUSH
3.0000 mL | Freq: Two times a day (BID) | INTRAVENOUS | Status: DC
  Administered 2018-09-26: 13:00:00 3 mL via INTRAVENOUS

## 2018-09-26 MED ORDER — AMIODARONE HCL IN DEXTROSE 360-4.14 MG/200ML-% IV SOLN
60.0000 mg/h | INTRAVENOUS | Status: AC
  Administered 2018-09-26 (×2): 60 mg/h via INTRAVENOUS
  Filled 2018-09-26: qty 200

## 2018-09-26 MED ORDER — SODIUM CHLORIDE 0.9 % IV SOLN: 250.0000 mL | INTRAVENOUS | Status: DC

## 2018-09-26 MED ORDER — AMIODARONE LOAD VIA INFUSION
150.0000 mg | Freq: Once | INTRAVENOUS | Status: AC
  Administered 2018-09-26: 13:00:00 150 mg via INTRAVENOUS
  Filled 2018-09-26: qty 83.34

## 2018-09-26 MED ORDER — HEPARIN BOLUS VIA INFUSION
3000.0000 [IU] | Freq: Once | INTRAVENOUS | Status: AC
  Administered 2018-09-26: 11:00:00 3000 [IU] via INTRAVENOUS
  Filled 2018-09-26: qty 3000

## 2018-09-26 MED ORDER — AMIODARONE HCL IN DEXTROSE 360-4.14 MG/200ML-% IV SOLN
30.0000 mg/h | INTRAVENOUS | Status: DC
  Administered 2018-09-27: 02:00:00 30 mg/h via INTRAVENOUS
  Filled 2018-09-26 (×2): qty 200

## 2018-09-26 MED ORDER — HEPARIN (PORCINE) 25000 UT/250ML-% IV SOLN
1100.0000 [IU]/h | INTRAVENOUS | Status: DC
  Administered 2018-09-26 – 2018-09-27 (×2): 1100 [IU]/h via INTRAVENOUS
  Filled 2018-09-26 (×2): qty 250

## 2018-09-26 MED ORDER — SODIUM CHLORIDE 0.9% FLUSH: 3.0000 mL | INTRAVENOUS | Status: DC | PRN

## 2018-09-26 MED ORDER — HYDROCORTISONE 1 % EX CREA
1.0000 "application " | TOPICAL_CREAM | Freq: Three times a day (TID) | CUTANEOUS | Status: DC | PRN
  Filled 2018-09-26: qty 28

## 2018-09-26 NOTE — Discharge Summary (Addendum)
ELECTROPHYSIOLOGY PROCEDURE DISCHARGE SUMMARY    Patient ID: Mikayla [name],  MRN: [id number], DOB/AGE: Dec 31, 1940 78 y.o.  Admit date: 09/23/2018 Discharge date: 09/27/2018  Primary Care Physician: Mosie Lukes, MD Primary Cardiologist: Dr. Percival Spanish Electrophysiologist: Dr. Curt Bears  Primary Discharge Diagnosis:  1.  Paroxysmal atrial fibrillation   Secondary Discharge Diagnosis:  1. VHD     H/o TAVR 2. HTN 3. Prior h/o DVT 4. Chronic venous stasis    Allergies  Allergen Reactions  . Indomethacin Nausea And Vomiting and Other (See Comments)    Caused chest pain.  . Codeine Nausea And Vomiting     Procedures This Admission:  1.  Tikosyn loading (failed)    Brief HPI: Mikayla [name] is a 78 y.o. female with a past medical history as noted above.  She was referred to EP in the outpatient setting for treatment options of atrial fibrillation.  Risks, benefits, and alternatives to Tikosyn were reviewed with the patient who wished to proceed.    Hospital Course:  The patient was admitted and Tikosyn was initiated.  Renal function and electrolytes were followed during the hospitalization.  She arrived in SR, developed marked QT prolongation with 562mcg dose requiring down titration to 227mcg.  She was observed to have asymptomatic SB to 40's prompting sicontinuation of her rate controlling medicines.  Unfortunately with 264mcg dose she reverted to AFib.   She was quite symptomatic with her AFib with weakness, fatigue especially.   Dr. Curt Bears felt this was likely a failure of Tikosyn, though discussed options with the patient to continue load vs change to amiodarone as next AAD choice.  It was decided to abandon Tikosyn and pursue amiodarone. She had (likely spontaneous) conversion back to SR .  She was monitored until discharge on telemetry which demonstrated SR 70's.  On the day of discharge, the patient feels well, she was examined by Dr Curt Bears who considered her stable for  discharge to home.  Follow-up has been arranged with AFib clinic in 1 week and and her planned follow up with Dr. Percival Spanish already in place    I discussed amiodarone can affect warfarin/INR, she does her INRs at home and reports them to the coumadin clinic, she is instructed to check Monday and adjust as directed by them.  She was 1.8 yesterday and 2.0 this AM,  continue 5mg  daily (this is a lowered dose as of the day of her admission)  We  do amiodarone 200mg  BID for 1 month, then daily, discussed with the patient.   Physical Exam: Vitals:   09/26/18 1506 09/26/18 2016 09/27/18 0537 09/27/18 0748  BP: 129/63 (!) 149/83 125/65 (!) 135/55  Pulse: 79 87 67   Resp: 20 18 18    Temp: 98.3 F (36.8 C) 98.2 F (36.8 C) 98.3 F (36.8 C)   TempSrc: Oral Oral Oral   SpO2: 98% 96% 94% 94%  Weight:      Height:        GEN- The patient is well appearing, alert and oriented x 3 today.   HEENT: normocephalic, atraumatic; sclera clear, conjunctiva pink; hearing intact; oropharynx clear; neck supple, no JVP Lymph- no cervical lymphadenopathy Lungs- CTA b/l, normal work of breathing.  No wheezes, rales, rhonchi Heart- RRR, no murmurs, rubs or gallops, PMI not laterally displaced GI- soft, non-tender, non-distended Extremities- no clubbing, cyanosis, or edema MS- no significant deformity or atrophy Skin- warm and dry, no rash or lesion Psych- euthymic mood, full affect Neuro- strength and sensation  are intact   Labs:   Lab Results  Component Value Date   WBC 7.6 09/27/2018   HGB 12.8 09/27/2018   HCT 39.3 09/27/2018   MCV 92.9 09/27/2018   PLT 189 09/27/2018    Recent Labs  Lab 09/26/18 0400  NA 141  K 4.1  CL 109  CO2 23  BUN 13  CREATININE 0.74  CALCIUM 8.7*  GLUCOSE 105*     Discharge Medications:  Allergies as of 09/27/2018      Reactions   Indomethacin Nausea And Vomiting, Other (See Comments)   Caused chest pain.   Codeine Nausea And Vomiting       Medication List    STOP taking these medications   diltiazem 240 MG 24 hr capsule Commonly known as: Cartia XT   metoprolol tartrate 25 MG tablet Commonly known as: LOPRESSOR     TAKE these medications   acetaminophen 500 MG tablet Commonly known as: TYLENOL Take 500 mg by mouth every 8 (eight) hours as needed for mild pain or headache.   amiodarone 200 MG tablet Commonly known as: PACERONE Take 1 tablet (200 mg total) by mouth 2 (two) times daily.   amiodarone 200 MG tablet Commonly known as: PACERONE Take 1 tablet (200 mg total) by mouth daily. Start taking on: October 29, 2018   BENEFIBER PO Take 1 Dose by mouth daily.   beta carotene w/minerals tablet Take 1 tablet by mouth daily with supper.   CAL MAG ZINC +D3 PO Take 2 tablets by mouth daily.   Culturelle Digestive Health Caps Take 1 capsule by mouth 3 (three) times a week.   Fish Oil 1200 MG Caps Take 1,200 mg by mouth daily. W/ Omega-3 360 mg   gabapentin 800 MG tablet Commonly known as: NEURONTIN Take 1 tablet (800 mg total) by mouth 2 (two) times daily.   multivitamin with minerals Tabs tablet Take 1 tablet by mouth daily with supper.   omeprazole 40 MG capsule Commonly known as: PRILOSEC TAKE ONE CAPSULE BY MOUTH ONCE DAILY What changed: when to take this   SYSTANE OP Place 1 drop into both eyes 2 (two) times daily as needed (for dry eyes).   traMADol 50 MG tablet Commonly known as: ULTRAM TAKE ONE TABLET EVERY 12 HOURS AS NEEDED MODERATE PAIN. What changed: See the new instructions.   Vitamin D3 25 MCG (1000 UT) Caps Take 1,000 Units by mouth daily.   warfarin 5 MG tablet Commonly known as: COUMADIN Take as directed. If you are unsure how to take this medication, talk to your nurse or doctor. Original instructions: Take 1 tablet (5 mg total) by mouth as directed. What changed: when to take this       Disposition:  Home  Discharge Instructions    Diet - low sodium heart healthy    Complete by: As directed    Increase activity slowly   Complete by: As directed      Follow-up Information    [facility] up.   Specialty: Cardiology Why: 10/08/2018 @  1:00PM, echocardiogram  Contact information: [address], [address] [place]       Minus Breeding, MD Follow up.   Specialty: Cardiology Why: 10/09/2018 @ 11:00AM, this is a virtual visit, via [email or web address] as you have in the past Contact information: [address] [place] [address] [place] Follow up.  Specialty: Cardiology Why: 09/30/2018 @ 9:15AM, please do your coumadin check and adjust as directed by the coumadin clinic Contact information: [place] Follow up.   Specialty: Cardiology Why: 10/04/2018 10:00AM Contact information: [id number] [place] [phone]          Duration of Discharge Encounter: Greater than 30 minutes including physician time.  Signed, Tommye Standard, PA-C 09/27/2018 10:10 AM    I have seen and examined this patient with Tommye Standard.  Agree with above, note added to reflect my findings.  On exam, RRR, no murmurs, lungs clear. Patient admitted for tikosyn loading. Failed load and had recurrent AF with prolonged QTc. Loaded on amiodarone and back in sinus rhythm.  plan for continued amiodarone load and follow up in clinic.     M.  MD 09/27/2018 10:20 AM

## 2018-09-26 NOTE — Progress Notes (Signed)
ANTICOAGULATION CONSULT NOTE - Follow Up Consult  Pharmacy Consult for Coumadin and Heparin Indication: atrial fibrillation  Allergies  Allergen Reactions  . Indomethacin Nausea And Vomiting and Other (See Comments)    Caused chest pain.  . Codeine Nausea And Vomiting    Patient Measurements: Height: 5\' 5"  (165.1 cm) Weight: 168 lb 10.4 oz (76.5 kg) IBW/kg (Calculated) : 57 Heparin dosing weight: 76.5 kg  Vital Signs: Temp: 98.3 F (36.8 C) (08/20 1506) Temp Source: Oral (08/20 1506) BP: 129/63 (08/20 1506) Pulse Rate: 79 (08/20 1506)  Labs: Recent Labs    09/24/18 0343 09/25/18 0338 09/26/18 0400 09/26/18 1757  HGB  --   --  12.7  --   HCT  --   --  39.3  --   PLT  --   --  173  --   LABPROT 37.0* 30.0* 20.6*  --   INR 3.8* 2.9* 1.8*  --   HEPARINUNFRC  --   --   --  0.42  CREATININE 0.83 0.66 0.74  --     Estimated Creatinine Clearance: 58.3 mL/min (by C-G formula based on SCr of 0.74 mg/dL).  Assessment:  78 yr old female admitted 8/17 for Tikosyn initiation, but failed. Changed to IV amiodarone this am and may need DCCV.    Continues on Coumadin as prior to admission for atrial fibrillation. INR 3.2 on admit 8/17, then up to 3.8 pm 8/18 and dose held. INR down to 2.9 on 8/19 and usual dose of 5 mg given, but INR is subtherapeutic (1.8) today.   IV heparin added.    PTA Coumadin: 5 mg daily  PM Update: HL 0.42, therapeutic  Goal of Therapy:  INR 2-3 Heparin level 0.3-0.7 units/ml Monitor platelets by anticoagulation protocol: Yes   Plan:    Continue Heparin drip at 1100 units/hr   Confirmatory Heparin level in 8 hours.   Daily heparin level, PT/INR and CBC.   Will watch for sensitivity to Coumadin with addition of amiodarone.  Twylla Arceneaux A. Levada Dy, PharmD, BCPS, FNKF Clinical Pharmacist [place] Please utilize Amion for appropriate phone number to reach the unit pharmacist ([place])

## 2018-09-26 NOTE — Progress Notes (Addendum)
Progress Note  Patient Name: Mikayla [name] Date of Encounter: 09/26/2018  Primary Cardiologist: Minus Breeding, MD   Subjective   Disappointed, very aware of her AFib, makes her feel weak, reports brief/fleeting lightheaded with it at times (no brady is noted in AFib, BP has been good)  Inpatient Medications    Scheduled Meds: . amiodarone  150 mg Intravenous Once  . gabapentin  800 mg Oral BID  . pantoprazole  80 mg Oral Daily  . psyllium  1 packet Oral Daily  . sodium chloride flush  3 mL Intravenous Q12H  . sodium chloride flush  3 mL Intravenous Q12H  . Warfarin - Pharmacist Dosing Inpatient   Does not apply q1800   Continuous Infusions: . sodium chloride    . sodium chloride    . amiodarone     Followed by  . amiodarone     PRN Meds: sodium chloride, acetaminophen, hydrocortisone cream, hyoscyamine, polyvinyl alcohol, sodium chloride flush, sodium chloride flush, traMADol   Vital Signs    Vitals:   09/25/18 0718 09/25/18 1353 09/25/18 2047 09/26/18 0639  BP: 136/61 (!) 117/52 (!) 148/56 125/70  Pulse: 72 (!) 53 72 99  Resp: 20 16 20 20   Temp: 97.9 F (36.6 C) 98.4 F (36.9 C) 98.2 F (36.8 C) 97.9 F (36.6 C)  TempSrc: Oral Oral Oral Oral  SpO2: 96% 97% 99%   Weight: 77.6 kg   76.5 kg  Height:        Intake/Output Summary (Last 24 hours) at 09/26/2018 0958 Last data filed at 09/26/2018 0800 Gross per 24 hour  Intake 480 ml  Output -  Net 480 ml   Last 3 Weights 09/26/2018 09/25/2018 09/24/2018  Weight (lbs) 168 lb 10.4 oz 171 lb 168 lb 3.4 oz  Weight (kg) 76.5 kg 77.565 kg 76.3 kg      Telemetry    AFib 110's generally  90's-120's - Personally Reviewed with Dr. Curt Bears  ECG    AFib 105bpm, QTc 498ms - Personally Reviewed with Dr. Curt Bears  Physical Exam   GEN: No acute distress.   Neck: No JVD Cardiac: irreg-irreg, tachycardic, no murmurs, rubs, or gallops.  Respiratory: CTA b/l. GI: Soft, nontender, non-distended  MS: No edema; No  deformity. Neuro:  Nonfocal  Psych: Normal affect   Labs    High Sensitivity Troponin:  No results for input(s): TROPONINIHS in the last 720 hours.    Cardiac EnzymesNo results for input(s): TROPONINI in the last 168 hours. No results for input(s): TROPIPOC in the last 168 hours.   Chemistry Recent Labs  Lab 09/24/18 0343 09/25/18 0338 09/26/18 0400  NA 138 140 141  K 3.9 4.0 4.1  CL 107 111 109  CO2 23 23 23   GLUCOSE 148* 110* 105*  BUN 22 14 13   CREATININE 0.83 0.66 0.74  CALCIUM 8.3* 8.0* 8.7*  GFRNONAA >60 >60 >60  GFRAA >60 >60 >60  ANIONGAP 8 6 9      Hematology Recent Labs  Lab 09/26/18 0400  WBC 7.6  RBC 4.22  HGB 12.7  HCT 39.3  MCV 93.1  MCH 30.1  MCHC 32.3  RDW 13.8  PLT 173    BNPNo results for input(s): BNP, PROBNP in the last 168 hours.   DDimer No results for input(s): DDIMER in the last 168 hours.   Radiology    No results found.  Cardiac Studies    12/21/27: TTE Study Conclusions - Left ventricle: The cavity size was normal. Wall thickness  was   increased in a pattern of mild LVH. Systolic function was   vigorous. The estimated ejection fraction was in the range of 65%   to 70%. Wall motion was normal; there were no regional wall   motion abnormalities. Doppler parameters are consistent with   abnormal left ventricular relaxation (grade 1 diastolic   dysfunction). The E/e&' ratio is between 8-15, suggesting   indeterminate LV filling pressure. - Aortic valve: 26 mm Edwards Sapien 3 THV. Mild perivalvular leak,   no obstruction. Mean gradient (S): 12 mm Hg. Peak gradient (S):   28 mm Hg. Valve area (VTI): 1.85 cm^2. Valve area (Vmax): 1.48   cm^2. Valve area (Vmean): 1.7 cm^2. - Mitral valve: Mildly thickened leaflets . There was trivial   regurgitation. - Left atrium: The atrium was normal in size. - Tricuspid valve: There was mild regurgitation. - Pulmonary arteries: PA peak pressure: 36 mm Hg (S). - Inferior vena cava: The  vessel was normal in size. The   respirophasic diameter changes were in the normal range (= 50%),   consistent with normal central venous pressure.  Impressions:  - Compared to a prior study in 11/2017, the findings are largely   unchanged except for mild perivalvular leak.   09/28/17: LHC  Ost 2nd Mrg lesion is 20% stenosed.  Ost LAD to Prox LAD lesion is 20% stenosed.  Prox LAD lesion is 30% stenosed.  Ost LM to Mid LM lesion is 20% stenosed.  Ost 1st Diag lesion is 70% stenosed.   1. Mild non-obstructive CAD. The small caliber diagonal branch has a moderate stenosis, too small for PCI.  2. Severe aortic stenosis by echo findings. By cath mean gradient 26 mmHg, peak to peak gradient 21 mmHg, AVA 1.38 cm2  Patient Profile     78 y.o. 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 Paroxysmal Afib     CHA2DS2Vasc is 4, on warfarin (pharmacy management while here)      Unfortunately she has reverted to AFib, rates 110's-120s and symptomatic with weakness, fatige, and a couple dizzy spells last night.  She is very aware of her AF  She has failed Tikosyn. Dr. Curt Bears has seen and disussed at length with the patient.  She is disappointed, a bit anxious Dorrian Doggett plan to start amiodarone (gtt today to start load), Dr. Curt Bears OK with starting today, last dose of Tikosyn last night.  Plan for DCCV tomorrow, Dr. Curt Bears discussed procedure, potential risks and benefits, she is nervous but agreeable  INR today is 1.8 She has been in AFib < 24 hours, her INR yesterday was 2.9 D/w Dr. Curt Bears, Brynden Thune start heparin gtt and proceed with DCCV tomorrow. I have d/w pharmacist     2. VHD s/p TAVR last year     Stable, no symptoms     Her 1 year f/u echo and appt in in place, pt made aware   3. HTN     No changes today   4. Chills (?) 8/17/evening     No  fever, pre-admit COVID neg     Pt dates these episodes back years, occurs randomly a few times a year     Has been rx Hyoscyamine by her PMD, unclear source of this symptom, though the hyoscyamine resolves them     None further since here  For questions or updates, please contact [place] Please consult [email or web address] for contact info under        Signed, Baldwin [place], PA-C  09/26/2018, 9:58 AM     I have seen and examined this patient with Tommye Standard.  Agree with above, note added to reflect my findings.  On exam, iRRR, no murmurs, lungs clear. Went into AF despite tikosyn loading. Alyshia Kernan plan for amiodarone loading with cardioversion tomorrow.   Mykenzi Vanzile M. Mykeal Carrick MD 09/26/2018 5:54 PM

## 2018-09-26 NOTE — Progress Notes (Signed)
ANTICOAGULATION CONSULT NOTE - Follow Up Consult  Pharmacy Consult for Coumadin and Heparin Indication: atrial fibrillation  Allergies  Allergen Reactions  . Indomethacin Nausea And Vomiting and Other (See Comments)    Caused chest pain.  . Codeine Nausea And Vomiting    Patient Measurements: Height: 5\' 5"  (165.1 cm) Weight: 168 lb 10.4 oz (76.5 kg) IBW/kg (Calculated) : 57 Heparin dosing weight: 76.5 kg  Vital Signs: Temp: 97.9 F (36.6 C) (08/20 0639) Temp Source: Oral (08/20 0639) BP: 125/70 (08/20 0639) Pulse Rate: 99 (08/20 0639)  Labs: Recent Labs    09/24/18 0343 09/25/18 0338 09/26/18 0400  HGB  --   --  12.7  HCT  --   --  39.3  PLT  --   --  173  LABPROT 37.0* 30.0* 20.6*  INR 3.8* 2.9* 1.8*  CREATININE 0.83 0.66 0.74    Estimated Creatinine Clearance: 58.3 mL/min (by C-G formula based on SCr of 0.74 mg/dL).  Assessment:  78 yr old female admitted 8/17 for Tikosyn initiation, but failed. Changed to IV amiodarone this am and may need DCCV.    Continues on Coumadin as prior to admission for atrial fibrillation. INR 3.2 on admit 8/17, then up to 3.8 pm 8/18 and dose held. INR down to 2.9 on 8/19 and usual dose of 5 mg given, but INR is subtherapeutic (1.8) today.   IV heparin added.    PTA Coumadin: 5 mg daily  Goal of Therapy:  INR 2-3 Heparin level 0.3-0.7 units/ml Monitor platelets by anticoagulation protocol: Yes   Plan:    Heparin 3000 units IV x 1, then 1100 units/hr   Heparin level ~8 hrs after drip begins.   Increase Coumadin 7.5 mg x 1 tonight.   Daily heparin level, PT/INR and CBC.   Will watch for sensitivity to Coumadin with addition of amiodarone.  Arty Baumgartner, [place] Pager: [phone] or phone: [phone] 09/26/2018,11:33 AM

## 2018-09-26 NOTE — Care Management Important Message (Signed)
Important Message  Patient Details  Name: Mikayla [name] MRN: [id number] Date of Birth: 09-29-1940   Medicare Important Message Given:  Yes     [name] 09/26/2018, 2:38 PM

## 2018-09-27 ENCOUNTER — Encounter (HOSPITAL_COMMUNITY): Payer: Self-pay | Admitting: Anesthesiology

## 2018-09-27 LAB — HEPARIN LEVEL (UNFRACTIONATED): Heparin Unfractionated: 0.39 IU/mL (ref 0.30–0.70)

## 2018-09-27 LAB — CBC
HCT: 39.3 % (ref 36.0–46.0)
Hemoglobin: 12.8 g/dL (ref 12.0–15.0)
MCH: 30.3 pg (ref 26.0–34.0)
MCHC: 32.6 g/dL (ref 30.0–36.0)
MCV: 92.9 fL (ref 80.0–100.0)
Platelets: 189 10*3/uL (ref 150–400)
RBC: 4.23 MIL/uL (ref 3.87–5.11)
RDW: 13.7 % (ref 11.5–15.5)
WBC: 7.6 10*3/uL (ref 4.0–10.5)
nRBC: 0 % (ref 0.0–0.2)

## 2018-09-27 LAB — PROTIME-INR
INR: 2 — ABNORMAL HIGH (ref 0.8–1.2)
Prothrombin Time: 22.7 seconds — ABNORMAL HIGH (ref 11.4–15.2)

## 2018-09-27 SURGERY — CARDIOVERSION
Anesthesia: General

## 2018-09-27 MED ORDER — AMIODARONE HCL 200 MG PO TABS: 200.0000 mg | ORAL_TABLET | Freq: Every day | ORAL | 6 refills | Status: DC

## 2018-09-27 MED ORDER — AMIODARONE HCL 200 MG PO TABS: 200.0000 mg | ORAL_TABLET | Freq: Two times a day (BID) | ORAL | 0 refills | Status: DC

## 2018-09-27 MED ORDER — AMIODARONE HCL 200 MG PO TABS
200.0000 mg | ORAL_TABLET | Freq: Two times a day (BID) | ORAL | Status: DC
  Administered 2018-09-27: 11:00:00 200 mg via ORAL
  Filled 2018-09-27: qty 1

## 2018-09-27 MED ORDER — AMIODARONE HCL 200 MG PO TABS: 200.0000 mg | ORAL_TABLET | Freq: Every day | ORAL | Status: DC

## 2018-09-27 NOTE — Progress Notes (Signed)
Pt discharged to home with daughter, d/c instructions given to both at car.  Pt and daughter verbalize understanding of medication adm. And follow up apt's.

## 2018-09-27 NOTE — Progress Notes (Signed)
Pt converted to sinus rhythm overnight. EKG done to confirm.

## 2018-09-27 NOTE — Anesthesia Preprocedure Evaluation (Deleted)
Anesthesia Evaluation    Reviewed: Allergy & Precautions, Patient's Chart, lab work & pertinent test results, reviewed documented beta blocker date and time   History of Anesthesia Complications (+) PONV and history of anesthetic complications  Airway        Dental   Pulmonary neg pulmonary ROS,           Cardiovascular hypertension, Pt. on home beta blockers and Pt. on medications + CAD and + DVT  + dysrhythmias Atrial Fibrillation + Valvular Problems/Murmurs (s/p TAVR) AS   TTE 2019 EF 65-70%, G1DD, mild perivalvular leak around AV graft, mild TR  LHC 09/2017 Ost 2nd Mrg lesion is 20% stenosed. Ost LAD to Prox LAD lesion is 20% stenosed. Prox LAD lesion is 30% stenosed. Ost LM to Mid LM lesion is 20% stenosed. Ost 1st Diag lesion is 70% stenosed.   Neuro/Psych PSYCHIATRIC DISORDERS Dementia negative neurological ROS     GI/Hepatic Neg liver ROS, GERD  ,  Endo/Other  negative endocrine ROS  Renal/GU negative Renal ROS  negative genitourinary   Musculoskeletal  (+) Arthritis ,   Abdominal   Peds  Hematology negative hematology ROS (+)   Anesthesia Other Findings CV for afib on coumadin  Reproductive/Obstetrics                             Anesthesia Physical Anesthesia Plan  ASA: III  Anesthesia Plan: General   Post-op Pain Management:    Induction: Intravenous  PONV Risk Score and Plan: 4 or greater and Treatment may vary due to age or medical condition and Propofol infusion  Airway Management Planned: Natural Airway and Mask  Additional Equipment:   Intra-op Plan:   Post-operative Plan:   Informed Consent: I have reviewed the patients History and Physical, chart, labs and discussed the procedure including the risks, benefits and alternatives for the proposed anesthesia with the patient or authorized representative who has indicated his/her understanding and acceptance.      Dental advisory given  Plan Discussed with: CRNA  Anesthesia Plan Comments:         Anesthesia Quick Evaluation

## 2018-09-30 ENCOUNTER — Other Ambulatory Visit: Payer: Self-pay

## 2018-09-30 ENCOUNTER — Ambulatory Visit: Payer: Medicare Other

## 2018-09-30 ENCOUNTER — Ambulatory Visit (INDEPENDENT_AMBULATORY_CARE_PROVIDER_SITE_OTHER): Payer: Medicare Other | Admitting: Pharmacist Clinician (PhC)/ Clinical Pharmacy Specialist

## 2018-09-30 DIAGNOSIS — Z7901 Long term (current) use of anticoagulants: Secondary | ICD-10-CM | POA: Diagnosis not present

## 2018-09-30 DIAGNOSIS — I480 Paroxysmal atrial fibrillation: Secondary | ICD-10-CM

## 2018-09-30 DIAGNOSIS — Z86718 Personal history of other venous thrombosis and embolism: Secondary | ICD-10-CM

## 2018-09-30 LAB — POCT INR: INR: 2.5 (ref 2.0–3.0)

## 2018-10-04 ENCOUNTER — Encounter (HOSPITAL_COMMUNITY): Payer: Self-pay | Admitting: Physician Assistant

## 2018-10-04 ENCOUNTER — Ambulatory Visit (HOSPITAL_COMMUNITY)
Admit: 2018-10-04 | Discharge: 2018-10-04 | Disposition: A | Discharge status: Home / Self Care | Payer: Medicare Other | Source: Ambulatory Visit | Attending: Physician Assistant | Admitting: Physician Assistant

## 2018-10-04 ENCOUNTER — Other Ambulatory Visit: Payer: Self-pay

## 2018-10-04 VITALS — BP 162/78 | HR 65 | Ht 65.0 in | Wt 174.0 lb

## 2018-10-04 DIAGNOSIS — R011 Cardiac murmur, unspecified: Secondary | ICD-10-CM | POA: Insufficient documentation

## 2018-10-04 DIAGNOSIS — Z7901 Long term (current) use of anticoagulants: Secondary | ICD-10-CM | POA: Insufficient documentation

## 2018-10-04 DIAGNOSIS — M159 Polyosteoarthritis, unspecified: Secondary | ICD-10-CM | POA: Insufficient documentation

## 2018-10-04 DIAGNOSIS — I2510 Atherosclerotic heart disease of native coronary artery without angina pectoris: Secondary | ICD-10-CM | POA: Insufficient documentation

## 2018-10-04 DIAGNOSIS — I6529 Occlusion and stenosis of unspecified carotid artery: Secondary | ICD-10-CM | POA: Diagnosis not present

## 2018-10-04 DIAGNOSIS — Z79899 Other long term (current) drug therapy: Secondary | ICD-10-CM | POA: Insufficient documentation

## 2018-10-04 DIAGNOSIS — K219 Gastro-esophageal reflux disease without esophagitis: Secondary | ICD-10-CM | POA: Insufficient documentation

## 2018-10-04 DIAGNOSIS — D649 Anemia, unspecified: Secondary | ICD-10-CM | POA: Insufficient documentation

## 2018-10-04 DIAGNOSIS — I82409 Acute embolism and thrombosis of unspecified deep veins of unspecified lower extremity: Secondary | ICD-10-CM | POA: Insufficient documentation

## 2018-10-04 DIAGNOSIS — H3530 Unspecified macular degeneration: Secondary | ICD-10-CM | POA: Diagnosis not present

## 2018-10-04 DIAGNOSIS — E785 Hyperlipidemia, unspecified: Secondary | ICD-10-CM | POA: Diagnosis not present

## 2018-10-04 DIAGNOSIS — I480 Paroxysmal atrial fibrillation: Secondary | ICD-10-CM | POA: Insufficient documentation

## 2018-10-04 DIAGNOSIS — I10 Essential (primary) hypertension: Secondary | ICD-10-CM | POA: Insufficient documentation

## 2018-10-04 DIAGNOSIS — I350 Nonrheumatic aortic (valve) stenosis: Secondary | ICD-10-CM | POA: Insufficient documentation

## 2018-10-04 DIAGNOSIS — M5137 Other intervertebral disc degeneration, lumbosacral region: Secondary | ICD-10-CM | POA: Insufficient documentation
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
## 2018-10-04 NOTE — Patient Instructions (Signed)
Continue keeping daily blood pressure readings until follow up with Dr Percival Spanish
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
## 2018-10-07 ENCOUNTER — Ambulatory Visit (INDEPENDENT_AMBULATORY_CARE_PROVIDER_SITE_OTHER): Payer: Medicare Other | Admitting: Cardiovascular Disease

## 2018-10-07 DIAGNOSIS — Z7901 Long term (current) use of anticoagulants: Secondary | ICD-10-CM

## 2018-10-07 DIAGNOSIS — Z86718 Personal history of other venous thrombosis and embolism: Secondary | ICD-10-CM | POA: Diagnosis not present

## 2018-10-07 DIAGNOSIS — I480 Paroxysmal atrial fibrillation: Secondary | ICD-10-CM

## 2018-10-07 LAB — POCT INR: INR: 3.2 — AB (ref 2.0–3.0)

## 2018-10-08 ENCOUNTER — Other Ambulatory Visit: Payer: Self-pay | Admitting: Pharmacist

## 2018-10-08 ENCOUNTER — Other Ambulatory Visit: Payer: Self-pay

## 2018-10-08 ENCOUNTER — Ambulatory Visit (HOSPITAL_COMMUNITY): Payer: Medicare Other | Attending: Cardiovascular Disease

## 2018-10-08 ENCOUNTER — Telehealth: Payer: Self-pay | Admitting: Physician Assistant

## 2018-10-08 DIAGNOSIS — Z952 Presence of prosthetic heart valve: Secondary | ICD-10-CM | POA: Insufficient documentation

## 2018-10-08 MED ORDER — WARFARIN SODIUM 5 MG PO TABS: ORAL_TABLET | ORAL | 1 refills | Status: DC

## 2018-10-08 NOTE — Telephone Encounter (Signed)
Patient of Dr. Percival Spanish and Dr. Curt Bears  Mikayla [name] called because she has been back in atrial fibrillation for 2 days now.  She does not feel that great, she is a little short of breath when she gets up and moves around.  She has not had chest pain or presyncope.  She does not feel like she is in danger of falling or passing out.  She was recently hospitalized for Tikosyn which was unsuccessful and was started on amiodarone.  She has been compliant with the amiodarone, taking 1 tablet twice a day.  She has some leftover metoprolol 25 mg tablets and wonders if she should take them.  She has been checking her blood pressure and heart rate.  Her diastolic blood pressure has been higher than usual, up to 100 at times.  Her systolic blood pressure has not been over 140.  Her heart rate has been in the 60s and 70s.  She has not missed any doses of her Coumadin.  I explained that the major danger from atrial fibrillation was having a stroke because she was not on blood thinner.  As she is on a blood thinner, that is okay.  I explained that amiodarone is a drug that takes a long time to load.  As she has been back in atrial fibrillation for 2 days and does not like it, I increase the amiodarone up to 2 tablets twice a day for 1 week.  Then go back to 1 tablet daily.  She has a telehealth visit appointment with Dr. Percival Spanish tomorrow, she is encouraged to keep this and discuss this with him.  I have asked her to call us if her heart rate goes below 55 or over 120.  If it goes over 120, take half of the metoprolol.  I explained that some people are rate control only and we do not try to get them back in sinus rhythm.  However, she does not feel particularly well when she is in atrial fibrillation so I said we would keep trying to get her back in sinus rhythm.  [name], PA-C 10/08/2018 7:05 PM [facility] [phone]

## 2018-10-09 ENCOUNTER — Encounter: Payer: Self-pay | Admitting: Cardiology

## 2018-10-09 ENCOUNTER — Telehealth (INDEPENDENT_AMBULATORY_CARE_PROVIDER_SITE_OTHER): Payer: Medicare Other | Admitting: Cardiology

## 2018-10-09 VITALS — BP 115/70 | HR 61 | Ht 65.0 in | Wt 173.0 lb

## 2018-10-09 DIAGNOSIS — Z952 Presence of prosthetic heart valve: Secondary | ICD-10-CM

## 2018-10-09 DIAGNOSIS — I480 Paroxysmal atrial fibrillation: Secondary | ICD-10-CM

## 2018-10-09 DIAGNOSIS — I495 Sick sinus syndrome: Secondary | ICD-10-CM

## 2018-10-09 NOTE — Telephone Encounter (Signed)
Looks like she has a virtual visit with Hochrein today. Is being loaded on amiodarone. Was not able to tolerate tikosyn due to prolonged QTc. Would set up for cardioversion.

## 2018-10-09 NOTE — Patient Instructions (Signed)
Medication Instructions:  The current medical regimen is effective;  continue present plan and medications.  If you need a refill on your cardiac medications before your next appointment, please call your pharmacy.   Follow-Up: Follow up in 2 months with Dr Percival Spanish.  Thank you for choosing [facility]!!

## 2018-10-14 DIAGNOSIS — Z86718 Personal history of other venous thrombosis and embolism: Secondary | ICD-10-CM | POA: Diagnosis not present

## 2018-10-14 DIAGNOSIS — Z7901 Long term (current) use of anticoagulants: Secondary | ICD-10-CM | POA: Diagnosis not present

## 2018-10-14 DIAGNOSIS — I480 Paroxysmal atrial fibrillation: Secondary | ICD-10-CM | POA: Diagnosis not present

## 2018-10-14 LAB — POCT INR: INR: 3.9 — AB (ref 2.0–3.0)

## 2018-10-15 ENCOUNTER — Ambulatory Visit (INDEPENDENT_AMBULATORY_CARE_PROVIDER_SITE_OTHER): Payer: Medicare Other | Admitting: Pharmacist

## 2018-10-15 DIAGNOSIS — I480 Paroxysmal atrial fibrillation: Secondary | ICD-10-CM | POA: Diagnosis not present

## 2018-10-15 DIAGNOSIS — Z7901 Long term (current) use of anticoagulants: Secondary | ICD-10-CM

## 2018-10-15 DIAGNOSIS — Z86718 Personal history of other venous thrombosis and embolism: Secondary | ICD-10-CM

## 2018-10-17 ENCOUNTER — Other Ambulatory Visit: Payer: Self-pay | Admitting: Physician Assistant

## 2018-10-18 ENCOUNTER — Other Ambulatory Visit (HOSPITAL_COMMUNITY): Payer: Self-pay | Admitting: *Deleted

## 2018-10-18 MED ORDER — AMIODARONE HCL 200 MG PO TABS: 200.0000 mg | ORAL_TABLET | Freq: Two times a day (BID) | ORAL | 0 refills | Status: DC

## 2018-10-18 NOTE — Telephone Encounter (Signed)
Pt was started on this medication in hospital. I was not sure if you wanted to stay at same dose or change, which is why I did not refill as of yet. Will forward to Dr. Percival Spanish to advise.

## 2018-10-18 NOTE — Telephone Encounter (Signed)
This is Dr. Rosezella Florida pt. This medication was prescribed in the hospital. Please address

## 2018-10-21 ENCOUNTER — Telehealth: Payer: Self-pay | Admitting: *Deleted

## 2018-10-21 MED ORDER — AMIODARONE HCL 200 MG PO TABS: 200.0000 mg | ORAL_TABLET | Freq: Two times a day (BID) | ORAL | 5 refills | Status: DC

## 2018-10-21 NOTE — Telephone Encounter (Addendum)
Confirmed Amiodarone at 200 mg twice a day New Rx sent to [facility] refill by Afib clinic, cancelled Rx sent to [facility] under Dr Percival Spanish as patient has appointment next week and dose may chase according to last visit with Clint F PA

## 2018-10-21 NOTE — Addendum Note (Signed)
Addended by: Alvina Filbert B on: 10/21/2018 06:39 PM   Modules accepted: Orders

## 2018-10-22 ENCOUNTER — Ambulatory Visit (INDEPENDENT_AMBULATORY_CARE_PROVIDER_SITE_OTHER): Payer: Medicare Other | Admitting: Pharmacist

## 2018-10-22 DIAGNOSIS — Z7901 Long term (current) use of anticoagulants: Secondary | ICD-10-CM

## 2018-10-22 DIAGNOSIS — I480 Paroxysmal atrial fibrillation: Secondary | ICD-10-CM | POA: Diagnosis not present

## 2018-10-22 DIAGNOSIS — Z86718 Personal history of other venous thrombosis and embolism: Secondary | ICD-10-CM

## 2018-10-22 LAB — POCT INR: INR: 2.9 (ref 2.0–3.0)

## 2018-10-23 ENCOUNTER — Telehealth: Payer: Self-pay | Admitting: *Deleted

## 2018-10-23 ENCOUNTER — Other Ambulatory Visit: Payer: Self-pay | Admitting: Family Medicine

## 2018-10-23 DIAGNOSIS — E785 Hyperlipidemia, unspecified: Secondary | ICD-10-CM

## 2018-10-23 DIAGNOSIS — I10 Essential (primary) hypertension: Secondary | ICD-10-CM

## 2018-10-23 DIAGNOSIS — R739 Hyperglycemia, unspecified: Secondary | ICD-10-CM

## 2018-10-23 NOTE — Telephone Encounter (Signed)
Pt has lab appt tomorrow at 9:45am but there are no future orders in Epic.  Please place appropriate orders.

## 2018-10-23 NOTE — Telephone Encounter (Signed)
Orders in 

## 2018-10-24 ENCOUNTER — Other Ambulatory Visit: Payer: Self-pay

## 2018-10-24 ENCOUNTER — Telehealth: Payer: Self-pay | Admitting: *Deleted

## 2018-10-24 ENCOUNTER — Ambulatory Visit (INDEPENDENT_AMBULATORY_CARE_PROVIDER_SITE_OTHER): Payer: Medicare Other | Admitting: *Deleted

## 2018-10-24 ENCOUNTER — Other Ambulatory Visit (INDEPENDENT_AMBULATORY_CARE_PROVIDER_SITE_OTHER): Payer: Medicare Other

## 2018-10-24 ENCOUNTER — Other Ambulatory Visit: Payer: Self-pay | Admitting: Family Medicine

## 2018-10-24 DIAGNOSIS — Z23 Encounter for immunization: Secondary | ICD-10-CM | POA: Diagnosis not present

## 2018-10-24 DIAGNOSIS — I10 Essential (primary) hypertension: Secondary | ICD-10-CM | POA: Diagnosis not present

## 2018-10-24 DIAGNOSIS — E785 Hyperlipidemia, unspecified: Secondary | ICD-10-CM

## 2018-10-24 DIAGNOSIS — R739 Hyperglycemia, unspecified: Secondary | ICD-10-CM

## 2018-10-24 DIAGNOSIS — K8050 Calculus of bile duct without cholangitis or cholecystitis without obstruction: Secondary | ICD-10-CM

## 2018-10-24 DIAGNOSIS — R1013 Epigastric pain: Secondary | ICD-10-CM

## 2018-10-24 DIAGNOSIS — D72829 Elevated white blood cell count, unspecified: Secondary | ICD-10-CM

## 2018-10-24 DIAGNOSIS — R7401 Elevation of levels of liver transaminase levels: Secondary | ICD-10-CM

## 2018-10-24 LAB — COMPREHENSIVE METABOLIC PANEL
ALT: 855 U/L — ABNORMAL HIGH (ref 0–35)
AST: 933 U/L — ABNORMAL HIGH (ref 0–37)
Albumin: 3.8 g/dL (ref 3.5–5.2)
Alkaline Phosphatase: 304 U/L — ABNORMAL HIGH (ref 39–117)
BUN: 17 mg/dL (ref 6–23)
CO2: 26 mEq/L (ref 19–32)
Calcium: 8.6 mg/dL (ref 8.4–10.5)
Chloride: 103 mEq/L (ref 96–112)
Creatinine, Ser: 0.9 mg/dL (ref 0.40–1.20)
GFR: 60.24 mL/min (ref >60.00)
Glucose, Bld: 132 mg/dL — ABNORMAL HIGH (ref 70–99)
Potassium: 3.6 mEq/L (ref 3.5–5.1)
Sodium: 141 mEq/L (ref 135–145)
Total Bilirubin: 3 mg/dL — ABNORMAL HIGH (ref 0.2–1.2)
Total Protein: 6.3 g/dL (ref 6.0–8.3)

## 2018-10-24 LAB — CBC
HCT: 40.1 % (ref 36.0–46.0)
Hemoglobin: 13 g/dL (ref 12.0–15.0)
MCHC: 32.4 g/dL (ref 30.0–36.0)
MCV: 92.8 fl (ref 78.0–100.0)
Platelets: 216 10*3/uL (ref 150.0–400.0)
RBC: 4.32 Mil/uL (ref 3.87–5.11)
RDW: 14.6 % (ref 11.5–15.5)
WBC: 20.9 10*3/uL (ref 4.0–10.5)

## 2018-10-24 LAB — LIPID PANEL
Cholesterol: 169 mg/dL (ref 0–200)
HDL: 63.2 mg/dL (ref >39.00)
LDL Cholesterol: 94 mg/dL (ref 0–99)
NonHDL: 105.56
Total CHOL/HDL Ratio: 3
Triglycerides: 57 mg/dL (ref 0.0–149.0)
VLDL: 11.4 mg/dL (ref 0.0–40.0)

## 2018-10-24 LAB — HEMOGLOBIN A1C: Hgb A1c MFr Bld: 5.7 % (ref 4.6–6.5)

## 2018-10-24 LAB — TSH: TSH: 1.28 u[IU]/mL (ref 0.35–4.50)

## 2018-10-24 NOTE — Progress Notes (Signed)
Patient here today for flu shot.  Vaccine given and patient tolerated well.

## 2018-10-24 NOTE — Progress Notes (Signed)
C-

## 2018-10-24 NOTE — Telephone Encounter (Signed)
Spoke with patient by phone due to abnormal labs see documentation

## 2018-10-24 NOTE — Telephone Encounter (Signed)
Mikayla [name] called from lab for critical lab.  WBC  20.9

## 2018-10-25 ENCOUNTER — Other Ambulatory Visit: Payer: Self-pay | Admitting: Family Medicine

## 2018-10-25 ENCOUNTER — Ambulatory Visit (HOSPITAL_COMMUNITY)
Admission: RE | Admit: 2018-10-25 | Discharge: 2018-10-25 | Disposition: A | Discharge status: Home / Self Care | Payer: Medicare Other | Source: Ambulatory Visit | Attending: Family Medicine | Admitting: Family Medicine

## 2018-10-25 ENCOUNTER — Other Ambulatory Visit (INDEPENDENT_AMBULATORY_CARE_PROVIDER_SITE_OTHER): Payer: Medicare Other

## 2018-10-25 DIAGNOSIS — R945 Abnormal results of liver function studies: Secondary | ICD-10-CM

## 2018-10-25 DIAGNOSIS — R1013 Epigastric pain: Secondary | ICD-10-CM | POA: Diagnosis not present

## 2018-10-25 DIAGNOSIS — K8020 Calculus of gallbladder without cholecystitis without obstruction: Secondary | ICD-10-CM | POA: Diagnosis not present

## 2018-10-25 DIAGNOSIS — R109 Unspecified abdominal pain: Secondary | ICD-10-CM

## 2018-10-25 DIAGNOSIS — D72829 Elevated white blood cell count, unspecified: Secondary | ICD-10-CM

## 2018-10-25 DIAGNOSIS — K8050 Calculus of bile duct without cholangitis or cholecystitis without obstruction: Secondary | ICD-10-CM

## 2018-10-25 DIAGNOSIS — R932 Abnormal findings on diagnostic imaging of liver and biliary tract: Secondary | ICD-10-CM

## 2018-10-25 LAB — CBC WITH DIFFERENTIAL/PLATELET
Basophils Absolute: 0.1 10*3/uL (ref 0.0–0.1)
Basophils Relative: 0.8 % (ref 0.0–3.0)
Eosinophils Absolute: 0.3 10*3/uL (ref 0.0–0.7)
Eosinophils Relative: 2.5 % (ref 0.0–5.0)
HCT: 37.5 % (ref 36.0–46.0)
Hemoglobin: 12.5 g/dL (ref 12.0–15.0)
Lymphocytes Relative: 7.9 % — ABNORMAL LOW (ref 12.0–46.0)
Lymphs Abs: 1 10*3/uL (ref 0.7–4.0)
MCHC: 33.4 g/dL (ref 30.0–36.0)
MCV: 91.6 fl (ref 78.0–100.0)
Monocytes Absolute: 0.7 10*3/uL (ref 0.1–1.0)
Monocytes Relative: 5.9 % (ref 3.0–12.0)
Neutro Abs: 10 10*3/uL — ABNORMAL HIGH (ref 1.4–7.7)
Neutrophils Relative %: 82.9 % — ABNORMAL HIGH (ref 43.0–77.0)
Platelets: 191 10*3/uL (ref 150.0–400.0)
RBC: 4.1 Mil/uL (ref 3.87–5.11)
RDW: 14.5 % (ref 11.5–15.5)
WBC: 12.1 10*3/uL — ABNORMAL HIGH (ref 4.0–10.5)

## 2018-10-25 LAB — COMPREHENSIVE METABOLIC PANEL
ALT: 532 U/L — ABNORMAL HIGH (ref 0–35)
AST: 332 U/L — ABNORMAL HIGH (ref 0–37)
Albumin: 3.7 g/dL (ref 3.5–5.2)
Alkaline Phosphatase: 243 U/L — ABNORMAL HIGH (ref 39–117)
BUN: 12 mg/dL (ref 6–23)
CO2: 30 mEq/L (ref 19–32)
Calcium: 8.7 mg/dL (ref 8.4–10.5)
Chloride: 104 mEq/L (ref 96–112)
Creatinine, Ser: 0.69 mg/dL (ref 0.40–1.20)
GFR: 81.86 mL/min (ref >60.00)
Glucose, Bld: 86 mg/dL (ref 70–99)
Potassium: 3.8 mEq/L (ref 3.5–5.1)
Sodium: 141 mEq/L (ref 135–145)
Total Bilirubin: 1.2 mg/dL (ref 0.2–1.2)
Total Protein: 6.8 g/dL (ref 6.0–8.3)

## 2018-10-25 LAB — GAMMA GT: GGT: 434 U/L — ABNORMAL HIGH (ref 7–51)

## 2018-10-25 MED ORDER — GADOBUTROL 1 MMOL/ML IV SOLN
7.0000 mL | Freq: Once | INTRAVENOUS | Status: AC | PRN
  Administered 2018-10-25: 15:00:00 7 mL via INTRAVENOUS

## 2018-10-25 NOTE — Addendum Note (Signed)
Addended by: Trenda Moots on: [id number] 10:41 AM   Modules accepted: Orders

## 2018-10-28 ENCOUNTER — Telehealth: Payer: Self-pay | Admitting: *Deleted

## 2018-10-28 ENCOUNTER — Other Ambulatory Visit: Payer: Self-pay | Admitting: Family Medicine

## 2018-10-28 ENCOUNTER — Other Ambulatory Visit: Payer: Self-pay

## 2018-10-28 ENCOUNTER — Telehealth: Payer: Self-pay

## 2018-10-28 DIAGNOSIS — K8050 Calculus of bile duct without cholangitis or cholecystitis without obstruction: Secondary | ICD-10-CM

## 2018-10-28 DIAGNOSIS — R935 Abnormal findings on diagnostic imaging of other abdominal regions, including retroperitoneum: Secondary | ICD-10-CM

## 2018-10-28 DIAGNOSIS — R748 Abnormal levels of other serum enzymes: Secondary | ICD-10-CM

## 2018-10-28 DIAGNOSIS — D72829 Elevated white blood cell count, unspecified: Secondary | ICD-10-CM

## 2018-10-28 DIAGNOSIS — R7989 Other specified abnormal findings of blood chemistry: Secondary | ICD-10-CM

## 2018-10-28 NOTE — Telephone Encounter (Signed)
The patient has been notified of this information and all questions answered. The pt has been advised of the information and verbalized understanding.    

## 2018-10-28 NOTE — Telephone Encounter (Signed)
Appt made to see Dr Rush Landmark tomorrow 9/22 at 3:10 pm.  The pt has been advised

## 2018-10-28 NOTE — Telephone Encounter (Signed)
Call Type Triage / Clinical Relationship To Patient Self Return Phone Number [phone] (Primary) Chief Complaint Paging or Request for Consult Reason for Call Symptomatic / Request for Mikayla [name] states she said her heart is out of rhythm. Was told to call the dr. Translation No No Triage Reason Patient declined Nurse Assessment Nurse: Jasmine Pang, RN, Mikayla [name] Date/Time Eilene Ghazi Time): 10/26/2018 10:02:02 PM Confirm and document reason for call. If symptomatic, describe symptoms. ---Caller states that her heart is out of rhythm all day and Dr Charlett Blake told her to call her if it didn't go back into rhythm. Has been out several hours now and has not converted yet. No pain. Did not want triage. Just to contact on-call. Does not feel bad. Just told to contact Dr Charlett Blake if she does not convert.   Paging DoctorName Phone DateTime Result/Outcome Message Type Notes Penni Homans - MD ME:[id number] 10/26/2018 10:08:25 PM Paged On Call Back to Call Center Doctor Paged Please return my call re: one of your patients. She was told to notify you if any difficulties this weekend. Thanks Mikayla [name] :) Penni Homans - MD 10/26/2018 10:33:30 PM Paged On Call to the Patient Message Result On-call connected to pt at MD request

## 2018-10-28 NOTE — Telephone Encounter (Signed)
----- Message from Milus Banister, MD sent at 10/28/2018 10:35 AM EDT ----- Regarding: RE: curbside I don't think she's been actually seen by an MD since this started (telemed visit was most recent touch).  I can probably help with ERCP on Thursday the 24th after my currently scheduled cases but she needs OV at [facility] prior to that to see how she is doing, make sure that outpatient care is appropriate.    Casimiro Needle; Can someone see her in the office today or tomorrow? I do not have availability.    ----- Message ----- From: Irving Copas., MD Sent: 10/28/2018   9:11 AM EDT To: Milus Banister, MD, Timothy Lasso, RN, # Subject: RE: curbside                                   Ulice Dash, Agreed, she needs an ERCP. Jonta Gastineau, let's look at both [facility] and I's schedule for the next 2-weeks. For me, if we can get her on for Friday early AM, if we stop her Coumadin today, I'll use my day off to get her done or I think the next Thursday (10/1) while I am in the [facility], I will have a block of time from 1130-200 that I could go over to [facility] to do the procedure if we freeze my [place] slots. If Linna Hoff has an earlier block available I'm sure he would be OK with helping with this. Ulice Dash and Erline Levine, [name] will be in touch after we coordinate a date because we need to coordinate her Coumadin stoppage. If she has development of fevers or chills or significant jaundice then needs to come in and be done in Hospital. Thanks. GM ----- Message ----- From: Jerene Bears, MD Sent: 10/28/2018   8:57 AM EDT To: Milus Banister, MD, Mosie Lukes, MD, # Subject: RE: curbside                                   Erline Levine This lady definitely needs an ERCP.  The stones in the CBD are very likely to have caused the pain and the LFTs elevation. Good to see by scan that she does not have pancreatitis. I will forward this to Dr. Ardis Hughs and Dr. Rush Landmark.  Ideally we will get this taken care of ASAP.  Probably will need to be seen as  soon as possible and then procedure as soon as possible. If she worsens with pain, fever, then definitely to ER immediately. Thanks Alanda Slim and Chester Holstein,  Can you take a look at this note from [facility] as well as her labs and MRCP.  Appears to need ERCP  Thanks JMP  ----- Message ----- From: Mosie Lukes, MD Sent: 10/25/2018   5:07 PM EDT To: Jerene Bears, MD Subject: curbside                                       Happy Friday Hope you continue to feel better I could use some advice on this case just in terms of who I send her to next.   Here is her MR/MRCP of abd today  1. Choledocholithiasis with chronic intra and extrahepatic biliary dilatation, mildly improved from previous CTA 10/22/2017. 2.  No biliary ductal wall thickening or abnormal enhancement identified to suggest cholangitis. 3. The pancreas appears normal, without inflammation or ductal dilatation. 4. Stable hepatic and renal cysts, lumbar spondylosis and Aortic Atherosclerosis (ICD10-I70.0).  The problem is on Thursday her WBC was 20.9, AST was 933, ALT was 855, Bili was 3.0 and alk phos was 303.  On Wednesday night she had an episode of a squeezing pain around her upper abdomen and chills. She has had these episodes infrequently for several years accompanied by some nausea and vomiting which is transient. All of those symptoms are gone now and today her WBC is down to 12.1, her AST is down to 332, ALT is down to 532, her alk phos is 243 and her bili is back to normal at 1.2.   I am on call this weekend and she is feeling well enough that she will maintain a bland diet this weekend and call me with any concerns. I just need a plan for her for next week. Who would you send her to is my question? To have her biliary tract evaluated.  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   Anyway any advice on whom to  consult would be great.   Thanks and have a great weekend.   Erline Levine

## 2018-10-28 NOTE — Telephone Encounter (Signed)
ASAP  [facility] Pre-operative Risk Assessment     Request for surgical clearance:     Endoscopy Procedure  What type of surgery is being performed?     ERCP  When is this surgery scheduled?     9/25  What type of clearance is required ?   Pharmacy  Are there any medications that need to be held prior to surgery and how long? coumadin  Practice name and name of physician performing surgery?      [facility]  What is your office phone and fax number?      Phone- [phone]  [phone]  Anesthesia type (None, local, MAC, general) ?       MAC

## 2018-10-28 NOTE — Telephone Encounter (Signed)
I did I thought I documented but must not have she was told to take Metoprolol 1/2 tab bid prn afib. Take one Saturday night and another one on Sunday morning since she was having such frequent episodes. She had no new symptoms or return of abdominal pain or chills. She was eating a bland diet and tolerating well.

## 2018-10-28 NOTE — Telephone Encounter (Signed)
The pt has been scheduled for 9/25 at 730 am at [facility] with Dr Rush Landmark.  She has been advised and scheduled for COVID testing for 9/22.  I have sent a letter to Dr Jillyn Ledger office for ok to hold coumadin for an ASAP response.

## 2018-10-28 NOTE — Telephone Encounter (Signed)
Clinical pharmacist to review, surgery is in 4 days

## 2018-10-28 NOTE — Telephone Encounter (Signed)
Thanks for the update. I spoke with her last on Saturday and she was feeling well. No further pain or chills. Was tolerating her bland diet. Her afib is acting up some but she sees afib clinic tomorrow and I have scheduled her for repeat lab work today

## 2018-10-28 NOTE — Telephone Encounter (Signed)
Patient with diagnosis of afib on warfarin for anticoagulation.    Procedure: ERCP Date of procedure: 9/25  CHADS2-VASc score of 5  CrCl 80 mL/min Platelet count 191  Per office protocol, patient can hold warfarin for 5 days prior to procedure.  Patient will not need bridging with Lovenox (enoxaparin) around procedure.  Of note, patient has history of provoked DVT back in 2010.

## 2018-10-28 NOTE — Telephone Encounter (Signed)
Did you get this over the weekend?

## 2018-10-28 NOTE — Telephone Encounter (Signed)
----- Message from Mikayla [name]., MD sent at 10/28/2018  9:11 AM EDT ----- Regarding: RE: curbside Mikayla [name], Agreed, she needs an ERCP. Mikayla [name], let's look at both [facility] and I's schedule for the next 2-weeks. For me, if we can get her on for Friday early AM, if we stop her Coumadin today, I'll use my day off to get her done or I think the next Thursday (10/1) while I am in the [facility], I will have a block of time from 1130-200 that I could go over to [facility] to do the procedure if we freeze my [place] slots. If Mikayla [name] has an earlier block available I'm sure he would be OK with helping with this. Mikayla [name] and Mikayla [name], Mikayla [name] will be in touch after we coordinate a date because we need to coordinate her Coumadin stoppage. If she has development of fevers or chills or significant jaundice then needs to come in and be done in Hospital. Thanks. GM ----- Message ----- From: Jerene Bears, MD Sent: 10/28/2018   8:57 AM EDT To: Milus Banister, MD, Mosie Lukes, MD, # Subject: RE: curbside                                   Mikayla [name] This lady definitely needs an ERCP.  The stones in the CBD are very likely to have caused the pain and the LFTs elevation. Good to see by scan that she does not have pancreatitis. I will forward this to Dr. Ardis Hughs and Dr. Rush Landmark.  Ideally we will get this taken care of ASAP.  Probably will need to be seen as soon as possible and then procedure as soon as possible. If she worsens with pain, fever, then definitely to ER immediately. Thanks Mikayla [name] and Mikayla [name],  Can you take a look at this note from [facility] as well as her labs and MRCP.  Appears to need ERCP  Thanks JMP  ----- Message ----- From: Mosie Lukes, MD Sent: 10/25/2018   5:07 PM EDT To: Jerene Bears, MD Subject: curbside                                       Happy Friday Hope you continue to feel better I could use some advice on this case just in terms of who I send her to next.   Here is her MR/MRCP of abd  today  1. Choledocholithiasis with chronic intra and extrahepatic biliary dilatation, mildly improved from previous CTA 10/22/2017. 2. No biliary ductal wall thickening or abnormal enhancement identified to suggest cholangitis. 3. The pancreas appears normal, without inflammation or ductal dilatation. 4. Stable hepatic and renal cysts, lumbar spondylosis and Aortic Atherosclerosis (ICD10-I70.0).  The problem is on Thursday her WBC was 20.9, AST was 933, ALT was 855, Bili was 3.0 and alk phos was 303.  On Wednesday night she had an episode of a squeezing pain around her upper abdomen and chills. She has had these episodes infrequently for several years accompanied by some nausea and vomiting which is transient. All of those symptoms are gone now and today her WBC is down to 12.1, her AST is down to 332, ALT is down to 532, her alk phos is 243 and her bili is back to normal at 1.2.   I am on call this weekend  and she is feeling well enough that she will maintain a bland diet this weekend and call me with any concerns. I just need a plan for her for next week. Who would you send her to is my question? To have her biliary tract evaluated.  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   Anyway any advice on whom to consult would be great.   Thanks and have a great weekend.   Mikayla [name]

## 2018-10-28 NOTE — Telephone Encounter (Signed)
If it helps Dr Percival Spanish had asked Roderic Palau, NP at his office to help with this patients case

## 2018-10-29 ENCOUNTER — Other Ambulatory Visit (HOSPITAL_COMMUNITY): Payer: Medicare Other

## 2018-10-29 ENCOUNTER — Encounter (HOSPITAL_COMMUNITY): Payer: Self-pay | Admitting: Physician Assistant

## 2018-10-29 ENCOUNTER — Other Ambulatory Visit: Payer: Self-pay

## 2018-10-29 ENCOUNTER — Other Ambulatory Visit (HOSPITAL_COMMUNITY)
Admission: RE | Admit: 2018-10-29 | Discharge: 2018-10-29 | Disposition: A | Discharge status: Home / Self Care | Payer: Medicare Other | Source: Ambulatory Visit | Attending: Gastroenterology | Admitting: Gastroenterology

## 2018-10-29 ENCOUNTER — Ambulatory Visit (INDEPENDENT_AMBULATORY_CARE_PROVIDER_SITE_OTHER): Payer: Medicare Other | Admitting: Gastroenterology

## 2018-10-29 ENCOUNTER — Encounter: Payer: Self-pay | Admitting: Gastroenterology

## 2018-10-29 ENCOUNTER — Ambulatory Visit (INDEPENDENT_AMBULATORY_CARE_PROVIDER_SITE_OTHER): Payer: Medicare Other | Admitting: Pharmacist

## 2018-10-29 ENCOUNTER — Ambulatory Visit (HOSPITAL_COMMUNITY)
Admission: RE | Admit: 2018-10-29 | Discharge: 2018-10-29 | Disposition: A | Discharge status: Home / Self Care | Payer: Medicare Other | Source: Ambulatory Visit | Attending: Physician Assistant | Admitting: Physician Assistant

## 2018-10-29 ENCOUNTER — Other Ambulatory Visit (INDEPENDENT_AMBULATORY_CARE_PROVIDER_SITE_OTHER): Payer: Medicare Other

## 2018-10-29 VITALS — BP 160/80 | HR 70 | Ht 65.0 in | Wt 174.8 lb

## 2018-10-29 VITALS — BP 120/74 | HR 64 | Temp 98.6°F | Ht 65.0 in | Wt 176.0 lb

## 2018-10-29 DIAGNOSIS — R945 Abnormal results of liver function studies: Secondary | ICD-10-CM | POA: Diagnosis not present

## 2018-10-29 DIAGNOSIS — I480 Paroxysmal atrial fibrillation: Secondary | ICD-10-CM

## 2018-10-29 DIAGNOSIS — E785 Hyperlipidemia, unspecified: Secondary | ICD-10-CM | POA: Diagnosis not present

## 2018-10-29 DIAGNOSIS — Z86718 Personal history of other venous thrombosis and embolism: Secondary | ICD-10-CM | POA: Insufficient documentation

## 2018-10-29 DIAGNOSIS — R935 Abnormal findings on diagnostic imaging of other abdominal regions, including retroperitoneum: Secondary | ICD-10-CM | POA: Diagnosis not present

## 2018-10-29 DIAGNOSIS — D649 Anemia, unspecified: Secondary | ICD-10-CM | POA: Diagnosis not present

## 2018-10-29 DIAGNOSIS — Z01812 Encounter for preprocedural laboratory examination: Secondary | ICD-10-CM | POA: Diagnosis not present

## 2018-10-29 DIAGNOSIS — Z20828 Contact with and (suspected) exposure to other viral communicable diseases: Secondary | ICD-10-CM | POA: Insufficient documentation

## 2018-10-29 DIAGNOSIS — M810 Age-related osteoporosis without current pathological fracture: Secondary | ICD-10-CM | POA: Insufficient documentation

## 2018-10-29 DIAGNOSIS — Z79899 Other long term (current) drug therapy: Secondary | ICD-10-CM | POA: Diagnosis not present

## 2018-10-29 DIAGNOSIS — I350 Nonrheumatic aortic (valve) stenosis: Secondary | ICD-10-CM | POA: Insufficient documentation

## 2018-10-29 DIAGNOSIS — Z952 Presence of prosthetic heart valve: Secondary | ICD-10-CM | POA: Diagnosis not present

## 2018-10-29 DIAGNOSIS — Z7901 Long term (current) use of anticoagulants: Secondary | ICD-10-CM | POA: Insufficient documentation

## 2018-10-29 DIAGNOSIS — K219 Gastro-esophageal reflux disease without esophagitis: Secondary | ICD-10-CM | POA: Diagnosis not present

## 2018-10-29 DIAGNOSIS — I2510 Atherosclerotic heart disease of native coronary artery without angina pectoris: Secondary | ICD-10-CM | POA: Insufficient documentation

## 2018-10-29 DIAGNOSIS — R7989 Other specified abnormal findings of blood chemistry: Secondary | ICD-10-CM

## 2018-10-29 DIAGNOSIS — H3530 Unspecified macular degeneration: Secondary | ICD-10-CM | POA: Insufficient documentation

## 2018-10-29 DIAGNOSIS — R109 Unspecified abdominal pain: Secondary | ICD-10-CM

## 2018-10-29 DIAGNOSIS — K8020 Calculus of gallbladder without cholecystitis without obstruction: Secondary | ICD-10-CM

## 2018-10-29 DIAGNOSIS — R932 Abnormal findings on diagnostic imaging of liver and biliary tract: Secondary | ICD-10-CM | POA: Diagnosis not present

## 2018-10-29 DIAGNOSIS — M5137 Other intervertebral disc degeneration, lumbosacral region: Secondary | ICD-10-CM | POA: Insufficient documentation

## 2018-10-29 DIAGNOSIS — I6529 Occlusion and stenosis of unspecified carotid artery: Secondary | ICD-10-CM | POA: Diagnosis not present

## 2018-10-29 DIAGNOSIS — K8050 Calculus of bile duct without cholangitis or cholecystitis without obstruction: Secondary | ICD-10-CM | POA: Diagnosis not present

## 2018-10-29 LAB — CBC WITH DIFFERENTIAL/PLATELET
Basophils Absolute: 0.1 10*3/uL (ref 0.0–0.1)
Basophils Relative: 1.1 % (ref 0.0–3.0)
Eosinophils Absolute: 0.1 10*3/uL (ref 0.0–0.7)
Eosinophils Relative: 1.7 % (ref 0.0–5.0)
HCT: 38.9 % (ref 36.0–46.0)
Hemoglobin: 12.7 g/dL (ref 12.0–15.0)
Lymphocytes Relative: 21.2 % (ref 12.0–46.0)
Lymphs Abs: 1.6 10*3/uL (ref 0.7–4.0)
MCHC: 32.5 g/dL (ref 30.0–36.0)
MCV: 92.1 fl (ref 78.0–100.0)
Monocytes Absolute: 0.7 10*3/uL (ref 0.1–1.0)
Monocytes Relative: 8.5 % (ref 3.0–12.0)
Neutro Abs: 5.2 10*3/uL (ref 1.4–7.7)
Neutrophils Relative %: 67.5 % (ref 43.0–77.0)
Platelets: 250 10*3/uL (ref 150.0–400.0)
RBC: 4.23 Mil/uL (ref 3.87–5.11)
RDW: 14.7 % (ref 11.5–15.5)
WBC: 7.7 10*3/uL (ref 4.0–10.5)

## 2018-10-29 LAB — GAMMA GT: GGT: 282 U/L — ABNORMAL HIGH (ref 7–51)

## 2018-10-29 LAB — POCT INR: INR: 2.6 (ref 2.0–3.0)

## 2018-10-29 LAB — COMPREHENSIVE METABOLIC PANEL
ALT: 125 U/L — ABNORMAL HIGH (ref 0–35)
AST: 26 U/L (ref 0–37)
Albumin: 3.7 g/dL (ref 3.5–5.2)
Alkaline Phosphatase: 165 U/L — ABNORMAL HIGH (ref 39–117)
BUN: 13 mg/dL (ref 6–23)
CO2: 31 mEq/L (ref 19–32)
Calcium: 9.3 mg/dL (ref 8.4–10.5)
Chloride: 104 mEq/L (ref 96–112)
Creatinine, Ser: 0.86 mg/dL (ref 0.40–1.20)
GFR: 63.48 mL/min (ref >60.00)
Glucose, Bld: 109 mg/dL — ABNORMAL HIGH (ref 70–99)
Potassium: 4.2 mEq/L (ref 3.5–5.1)
Sodium: 141 mEq/L (ref 135–145)
Total Bilirubin: 0.7 mg/dL (ref 0.2–1.2)
Total Protein: 6.8 g/dL (ref 6.0–8.3)

## 2018-10-29 MED ORDER — METOPROLOL TARTRATE 25 MG PO TABS: 12.5000 mg | ORAL_TABLET | Freq: Two times a day (BID) | ORAL | 3 refills | Status: DC

## 2018-10-29 NOTE — Patient Instructions (Signed)
Start metoprolol 12.5mg  twice a day (1/2 of your 25mg  tablet)  Scheduling will be in touch with you regarding appointment with Dr. Curt Bears in about 3 weeks.
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
## 2018-10-30 LAB — NOVEL CORONAVIRUS, NAA (HOSP ORDER, SEND-OUT TO REF LAB; TAT 18-24 HRS): SARS-CoV-2, NAA: NOT DETECTED
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
## 2018-10-31 ENCOUNTER — Ambulatory Visit (INDEPENDENT_AMBULATORY_CARE_PROVIDER_SITE_OTHER): Payer: Medicare Other | Admitting: Cardiology

## 2018-10-31 ENCOUNTER — Encounter (HOSPITAL_COMMUNITY): Payer: Self-pay | Admitting: *Deleted

## 2018-10-31 ENCOUNTER — Other Ambulatory Visit: Payer: Self-pay | Admitting: Family Medicine

## 2018-10-31 ENCOUNTER — Encounter: Payer: Self-pay | Admitting: Gastroenterology

## 2018-10-31 ENCOUNTER — Other Ambulatory Visit: Payer: Self-pay

## 2018-10-31 ENCOUNTER — Telehealth: Payer: Self-pay

## 2018-10-31 DIAGNOSIS — I480 Paroxysmal atrial fibrillation: Secondary | ICD-10-CM

## 2018-10-31 DIAGNOSIS — Z7901 Long term (current) use of anticoagulants: Secondary | ICD-10-CM

## 2018-10-31 DIAGNOSIS — Z86718 Personal history of other venous thrombosis and embolism: Secondary | ICD-10-CM

## 2018-10-31 LAB — POCT INR: INR: 1.8 — AB (ref 2.0–3.0)

## 2018-10-31 NOTE — Telephone Encounter (Signed)
Thank you for the update. We will move forward with her planned ERCP tomorrow.   Thank you. GM

## 2018-10-31 NOTE — Anesthesia Preprocedure Evaluation (Addendum)
Anesthesia Evaluation  Patient identified by MRN, date of birth, ID band Patient awake    Reviewed: Allergy & Precautions, NPO status , Patient's Chart, lab work & pertinent test results, reviewed documented beta blocker date and time   History of Anesthesia Complications (+) PONV  Airway Mallampati: I  TM Distance: >3 FB Neck ROM: Full    Dental no notable dental hx. (+) Edentulous Upper, Edentulous Lower   Pulmonary neg pulmonary ROS,    Pulmonary exam normal breath sounds clear to auscultation       Cardiovascular hypertension, Pt. on home beta blockers and Pt. on medications + CAD, + Peripheral Vascular Disease (carotid stenosis), +CHF and + DVT  Normal cardiovascular exam+ dysrhythmias Atrial Fibrillation + Valvular Problems/Murmurs (s/p TAVR 2019) AS  Rhythm:Regular Rate:Normal  TTE 10/2018 EF 60-65%, normally functioning TAVR with mean gradient of 7.8 mm Hg and no PVL  LHC 2019 Ost 2nd Mrg lesion is 20% stenosed. Ost LAD to Prox LAD lesion is 20% stenosed. Prox LAD lesion is 30% stenosed. Ost LM to Mid LM lesion is 20% stenosed. Ost 1st Diag lesion is 70% stenosed. 1. Mild non-obstructive CAD. The small caliber diagonal branch has a moderate stenosis, too small for PCI.  2. Severe aortic stenosis by echo findings. By cath mean gradient 26 mmHg, peak to peak gradient 21 mmHg, AVA 1.38 cm2.    Neuro/Psych negative neurological ROS  negative psych ROS   GI/Hepatic Neg liver ROS, GERD  ,  Endo/Other  negative endocrine ROS  Renal/GU negative Renal ROS  negative genitourinary   Musculoskeletal  (+) Arthritis ,   Abdominal   Peds  Hematology  (+) Blood dyscrasia (on coumadin), ,   Anesthesia Other Findings ERCP for CBD stone  Reproductive/Obstetrics                            Anesthesia Physical Anesthesia Plan  ASA: III  Anesthesia Plan: General   Post-op Pain Management:     Induction: Intravenous  PONV Risk Score and Plan: 4 or greater and Dexamethasone, Ondansetron and Treatment may vary due to age or medical condition  Airway Management Planned: Oral ETT  Additional Equipment:   Intra-op Plan:   Post-operative Plan: Extubation in OR  Informed Consent: I have reviewed the patients History and Physical, chart, labs and discussed the procedure including the risks, benefits and alternatives for the proposed anesthesia with the patient or authorized representative who has indicated his/her understanding and acceptance.     Dental advisory given  Plan Discussed with: CRNA  Anesthesia Plan Comments:         Anesthesia Quick Evaluation

## 2018-10-31 NOTE — Telephone Encounter (Signed)
Dr Rush Landmark the pt called this morning with her INR of 1.8.

## 2018-10-31 NOTE — Progress Notes (Signed)
Pre call complete for patient's procedure tomorrow. Patient states she has been quarantined and has not felt sick or been around anyone sick. All questions answered.

## 2018-10-31 NOTE — Progress Notes (Signed)
Spoke with pt for pre-op call. Pt has hx of A-fib and has had a TAVR last October. Pt still has sob at times. Cardiologist is Dr. Percival Spanish, last office visit was 10/09/18. Pt is on Coumadin, last dose was 10/29/18 Pt states she does not diabetes.  EKG 10/29/18 CXR 11/13/17 Echo 10/08/18 Cath 09/29/27  Labs done 10/29/18  Pt had Covid test done on 10/29/18 and it is negative. Pt states she's been in quarantine since having the test done.  Pt informed of visitation policy and voices understanding.

## 2018-10-31 NOTE — Telephone Encounter (Signed)
The patient has been notified of this information and all questions answered. The pt has been advised of the information and verbalized understanding.    

## 2018-11-01 ENCOUNTER — Other Ambulatory Visit: Payer: Self-pay

## 2018-11-01 ENCOUNTER — Encounter (HOSPITAL_COMMUNITY)
Admission: RE | Disposition: A | Discharge status: Home / Self Care | Payer: Self-pay | Source: Home / Self Care | Attending: Gastroenterology

## 2018-11-01 ENCOUNTER — Ambulatory Visit (HOSPITAL_COMMUNITY): Payer: Medicare Other | Admitting: Anesthesiology

## 2018-11-01 ENCOUNTER — Ambulatory Visit (HOSPITAL_COMMUNITY): Payer: Medicare Other

## 2018-11-01 ENCOUNTER — Ambulatory Visit (HOSPITAL_BASED_OUTPATIENT_CLINIC_OR_DEPARTMENT_OTHER)
Admission: RE | Admit: 2018-11-01 | Discharge: 2018-11-01 | Disposition: A | Discharge status: Home / Self Care | Payer: Medicare Other | Source: Home / Self Care | Attending: Gastroenterology | Admitting: Gastroenterology

## 2018-11-01 ENCOUNTER — Telehealth: Payer: Self-pay

## 2018-11-01 ENCOUNTER — Encounter (HOSPITAL_COMMUNITY): Payer: Self-pay | Admitting: Anesthesiology

## 2018-11-01 DIAGNOSIS — K8050 Calculus of bile duct without cholangitis or cholecystitis without obstruction: Secondary | ICD-10-CM | POA: Diagnosis not present

## 2018-11-01 DIAGNOSIS — R7989 Other specified abnormal findings of blood chemistry: Secondary | ICD-10-CM | POA: Insufficient documentation

## 2018-11-01 DIAGNOSIS — K838 Other specified diseases of biliary tract: Secondary | ICD-10-CM | POA: Diagnosis not present

## 2018-11-01 DIAGNOSIS — I5032 Chronic diastolic (congestive) heart failure: Secondary | ICD-10-CM | POA: Diagnosis not present

## 2018-11-01 DIAGNOSIS — I480 Paroxysmal atrial fibrillation: Secondary | ICD-10-CM | POA: Insufficient documentation

## 2018-11-01 DIAGNOSIS — E785 Hyperlipidemia, unspecified: Secondary | ICD-10-CM | POA: Diagnosis not present

## 2018-11-01 DIAGNOSIS — I10 Essential (primary) hypertension: Secondary | ICD-10-CM | POA: Insufficient documentation

## 2018-11-01 DIAGNOSIS — I872 Venous insufficiency (chronic) (peripheral): Secondary | ICD-10-CM | POA: Insufficient documentation

## 2018-11-01 DIAGNOSIS — Z03818 Encounter for observation for suspected exposure to other biological agents ruled out: Secondary | ICD-10-CM | POA: Diagnosis not present

## 2018-11-01 DIAGNOSIS — H3530 Unspecified macular degeneration: Secondary | ICD-10-CM | POA: Diagnosis not present

## 2018-11-01 DIAGNOSIS — R748 Abnormal levels of other serum enzymes: Secondary | ICD-10-CM | POA: Insufficient documentation

## 2018-11-01 DIAGNOSIS — R935 Abnormal findings on diagnostic imaging of other abdominal regions, including retroperitoneum: Secondary | ICD-10-CM | POA: Insufficient documentation

## 2018-11-01 DIAGNOSIS — M1990 Unspecified osteoarthritis, unspecified site: Secondary | ICD-10-CM | POA: Insufficient documentation

## 2018-11-01 DIAGNOSIS — Z9049 Acquired absence of other specified parts of digestive tract: Secondary | ICD-10-CM | POA: Insufficient documentation

## 2018-11-01 DIAGNOSIS — N281 Cyst of kidney, acquired: Secondary | ICD-10-CM | POA: Insufficient documentation

## 2018-11-01 DIAGNOSIS — K8020 Calculus of gallbladder without cholecystitis without obstruction: Secondary | ICD-10-CM | POA: Diagnosis not present

## 2018-11-01 DIAGNOSIS — N200 Calculus of kidney: Secondary | ICD-10-CM | POA: Diagnosis not present

## 2018-11-01 DIAGNOSIS — Z79899 Other long term (current) drug therapy: Secondary | ICD-10-CM | POA: Insufficient documentation

## 2018-11-01 DIAGNOSIS — Z8249 Family history of ischemic heart disease and other diseases of the circulatory system: Secondary | ICD-10-CM | POA: Insufficient documentation

## 2018-11-01 DIAGNOSIS — I700 Atherosclerosis of aorta: Secondary | ICD-10-CM | POA: Insufficient documentation

## 2018-11-01 DIAGNOSIS — E876 Hypokalemia: Secondary | ICD-10-CM | POA: Diagnosis not present

## 2018-11-01 DIAGNOSIS — K859 Acute pancreatitis without necrosis or infection, unspecified: Secondary | ICD-10-CM | POA: Diagnosis not present

## 2018-11-01 DIAGNOSIS — Z20828 Contact with and (suspected) exposure to other viral communicable diseases: Secondary | ICD-10-CM | POA: Diagnosis not present

## 2018-11-01 DIAGNOSIS — R112 Nausea with vomiting, unspecified: Secondary | ICD-10-CM | POA: Diagnosis not present

## 2018-11-01 DIAGNOSIS — Z9689 Presence of other specified functional implants: Secondary | ICD-10-CM | POA: Diagnosis not present

## 2018-11-01 DIAGNOSIS — Z7901 Long term (current) use of anticoagulants: Secondary | ICD-10-CM | POA: Insufficient documentation

## 2018-11-01 DIAGNOSIS — I110 Hypertensive heart disease with heart failure: Secondary | ICD-10-CM | POA: Diagnosis not present

## 2018-11-01 DIAGNOSIS — I2510 Atherosclerotic heart disease of native coronary artery without angina pectoris: Secondary | ICD-10-CM | POA: Insufficient documentation

## 2018-11-01 DIAGNOSIS — K839 Disease of biliary tract, unspecified: Secondary | ICD-10-CM

## 2018-11-01 DIAGNOSIS — K219 Gastro-esophageal reflux disease without esophagitis: Secondary | ICD-10-CM | POA: Insufficient documentation

## 2018-11-01 DIAGNOSIS — K2950 Unspecified chronic gastritis without bleeding: Secondary | ICD-10-CM | POA: Insufficient documentation

## 2018-11-01 DIAGNOSIS — R945 Abnormal results of liver function studies: Secondary | ICD-10-CM | POA: Diagnosis not present

## 2018-11-01 DIAGNOSIS — I739 Peripheral vascular disease, unspecified: Secondary | ICD-10-CM | POA: Insufficient documentation

## 2018-11-01 HISTORY — PX: REMOVAL OF STONES: SHX5545

## 2018-11-01 HISTORY — PX: BILIARY DILATION: SHX6850

## 2018-11-01 HISTORY — PX: ERCP: SHX5425

## 2018-11-01 HISTORY — DX: Dyspnea, unspecified: R06.00

## 2018-11-01 HISTORY — PX: SPHINCTEROTOMY: SHX5279

## 2018-11-01 HISTORY — PX: BILIARY STENT PLACEMENT: SHX5538

## 2018-11-01 HISTORY — PX: BIOPSY: SHX5522

## 2018-11-01 SURGERY — ERCP, WITH INTERVENTION IF INDICATED
Anesthesia: General

## 2018-11-01 MED ORDER — PHENYLEPHRINE HCL (PRESSORS) 10 MG/ML IV SOLN
INTRAVENOUS | Status: DC | PRN
  Administered 2018-11-01: 80 ug via INTRAVENOUS

## 2018-11-01 MED ORDER — SODIUM CHLORIDE 0.9 % IV SOLN
INTRAVENOUS | Status: DC | PRN
  Administered 2018-11-01: 25 ug/min via INTRAVENOUS

## 2018-11-01 MED ORDER — CIPROFLOXACIN IN D5W 400 MG/200ML IV SOLN
INTRAVENOUS | Status: AC
  Filled 2018-11-01: qty 200

## 2018-11-01 MED ORDER — PROPOFOL 10 MG/ML IV BOLUS
INTRAVENOUS | Status: DC | PRN
  Administered 2018-11-01: 50 mg via INTRAVENOUS

## 2018-11-01 MED ORDER — SUGAMMADEX SODIUM 200 MG/2ML IV SOLN
INTRAVENOUS | Status: DC | PRN
  Administered 2018-11-01: 200 mg via INTRAVENOUS

## 2018-11-01 MED ORDER — EPHEDRINE SULFATE 50 MG/ML IJ SOLN
INTRAMUSCULAR | Status: DC | PRN
  Administered 2018-11-01: 10 mg via INTRAVENOUS
  Administered 2018-11-01: 5 mg via INTRAVENOUS

## 2018-11-01 MED ORDER — INDOMETHACIN 50 MG RE SUPP
RECTAL | Status: AC
  Filled 2018-11-01: qty 2

## 2018-11-01 MED ORDER — SODIUM CHLORIDE 0.9 % IV SOLN: INTRAVENOUS | Status: DC

## 2018-11-01 MED ORDER — FENTANYL CITRATE (PF) 100 MCG/2ML IJ SOLN
INTRAMUSCULAR | Status: DC | PRN
  Administered 2018-11-01 (×2): 25 ug via INTRAVENOUS

## 2018-11-01 MED ORDER — DIPHENHYDRAMINE HCL 50 MG/ML IJ SOLN
INTRAMUSCULAR | Status: DC | PRN
  Administered 2018-11-01: 12.5 mg via INTRAVENOUS

## 2018-11-01 MED ORDER — ROCURONIUM BROMIDE 50 MG/5ML IV SOSY
PREFILLED_SYRINGE | INTRAVENOUS | Status: DC | PRN
  Administered 2018-11-01: 70 mg via INTRAVENOUS

## 2018-11-01 MED ORDER — LIDOCAINE 2% (20 MG/ML) 5 ML SYRINGE
INTRAMUSCULAR | Status: DC | PRN
  Administered 2018-11-01: 20 mg via INTRAVENOUS

## 2018-11-01 MED ORDER — INDOMETHACIN 50 MG RE SUPP
RECTAL | Status: DC | PRN
  Administered 2018-11-01: 100 mg via RECTAL

## 2018-11-01 MED ORDER — SODIUM CHLORIDE 0.9 % IV SOLN
INTRAVENOUS | Status: DC | PRN
  Administered 2018-11-01: 50 mL

## 2018-11-01 MED ORDER — LACTATED RINGERS IV SOLN
INTRAVENOUS | Status: DC | PRN
  Administered 2018-11-01: 07:00:00 via INTRAVENOUS

## 2018-11-01 MED ORDER — ONDANSETRON HCL 4 MG/2ML IJ SOLN
INTRAMUSCULAR | Status: DC | PRN
  Administered 2018-11-01: 4 mg via INTRAVENOUS

## 2018-11-01 MED ORDER — DIPHENHYDRAMINE HCL 50 MG/ML IJ SOLN
INTRAMUSCULAR | Status: AC
  Filled 2018-11-01: qty 1

## 2018-11-01 MED ORDER — GLUCAGON HCL RDNA (DIAGNOSTIC) 1 MG IJ SOLR
INTRAMUSCULAR | Status: DC | PRN
  Administered 2018-11-01 (×4): .25 mg via INTRAVENOUS

## 2018-11-01 MED ORDER — GLUCAGON HCL RDNA (DIAGNOSTIC) 1 MG IJ SOLR
INTRAMUSCULAR | Status: AC
  Filled 2018-11-01: qty 1

## 2018-11-01 MED ORDER — DEXAMETHASONE SODIUM PHOSPHATE 10 MG/ML IJ SOLN
INTRAMUSCULAR | Status: DC | PRN
  Administered 2018-11-01: 4 mg via INTRAVENOUS

## 2018-11-01 MED ORDER — LACTATED RINGERS IV SOLN
INTRAVENOUS | Status: AC | PRN
  Administered 2018-11-01: 1000 mL via INTRAVENOUS

## 2018-11-01 MED ORDER — CIPROFLOXACIN IN D5W 400 MG/200ML IV SOLN
INTRAVENOUS | Status: DC | PRN
  Administered 2018-11-01: 400 mg via INTRAVENOUS

## 2018-11-01 MED ORDER — GLYCOPYRROLATE PF 0.2 MG/ML IJ SOSY
PREFILLED_SYRINGE | INTRAMUSCULAR | Status: DC | PRN
  Administered 2018-11-01: .1 mg via INTRAVENOUS

## 2018-11-01 NOTE — Transfer of Care (Signed)
Immediate Anesthesia Transfer of Care Note  Patient: Mikayla [name]  Procedure(s) Performed: ENDOSCOPIC RETROGRADE CHOLANGIOPANCREATOGRAPHY (ERCP) (N/A ) SPHINCTEROTOMY BILIARY DILATION REMOVAL OF STONES BILIARY STENT PLACEMENT BIOPSY  Patient Location: Endoscopy Unit  Anesthesia Type:General  Level of Consciousness: awake, alert  and oriented  Airway & Oxygen Therapy: Patient Spontanous Breathing and Patient connected to nasal cannula oxygen  Post-op Assessment: Report given to RN, Post -op Vital signs reviewed and stable and Patient moving all extremities X 4  Post vital signs: Reviewed and stable  Last Vitals:  Vitals Value Taken Time  BP    Temp    Pulse 56 11/01/18 1008  Resp 14 11/01/18 1008  SpO2 100 % 11/01/18 1008  Vitals shown include unvalidated device data.  Last Pain:  Vitals:   11/01/18 1008  TempSrc: (P) Oral  PainSc:          Complications: No apparent anesthesia complications

## 2018-11-01 NOTE — Anesthesia Procedure Notes (Signed)
Procedure Name: Intubation Date/Time: 11/01/2018 7:54 AM Performed by: Neldon Newport, CRNA Pre-anesthesia Checklist: Timeout performed, Patient being monitored, Suction available, Emergency Drugs available and Patient identified Patient Re-evaluated:Patient Re-evaluated prior to induction Oxygen Delivery Method: Circle system utilized Preoxygenation: Pre-oxygenation with 100% oxygen Induction Type: IV induction Ventilation: Mask ventilation without difficulty Laryngoscope Size: Mac and 3 Grade View: Grade I Tube type: Oral Tube size: 7.0 mm Number of attempts: 1 Placement Confirmation: ETT inserted through vocal cords under direct vision,  positive ETCO2 and breath sounds checked- equal and bilateral Secured at: 18 cm Tube secured with: Tape Dental Injury: Teeth and Oropharynx as per pre-operative assessment

## 2018-11-01 NOTE — Telephone Encounter (Signed)
The pt's daughter has been given the appt information with Dr Lyndel Safe on 10/26 at 130 pm in [facility] to discuss ERCP.  Lab order in Epic for the pt to come in 2 weeks at the [facility] location.  The pt has been advised of the information and verbalized understanding.

## 2018-11-01 NOTE — Interval H&P Note (Signed)
History and Physical Interval Note:  11/01/2018 7:41 AM  Mikayla [name]  has presented today for surgery, with the diagnosis of elevated LFT, CBD stone.  The various methods of treatment have been discussed with the patient and family. After consideration of risks, benefits and other options for treatment, the patient has consented to  Procedure(s): ENDOSCOPIC RETROGRADE CHOLANGIOPANCREATOGRAPHY (ERCP) (N/A) as a surgical intervention.  The patient's history has been reviewed, patient examined, no change in status, stable for surgery.  I have reviewed the patient's chart and labs.  Questions were answered to the patient's satisfaction.    The risks of an ERCP were discussed at length, including but not limited to the risk of perforation, bleeding, abdominal pain, post-ERCP pancreatitis (while usually mild can be severe and even life threatening).  The risks of EUS including bleeding, infection, aspiration pneumonia and intestinal perforation were discussed as was the possibility it may not give a definitive diagnosis.  If a biopsy of the pancreas is done as part of the EUS, there is an additional risk of pancreatitis at the rate of about 1%.  It was explained that procedure related pancreatitis is typically mild, although can be severe and even life threatening, which is why we do not perform random pancreatic biopsies and only biopsy a lesion we feel is concerning enough to warrant the risk.  Patient has allergy of N/V with Indomethacin (though previously had been given orally).  I will wait on giving Indomethacin until we are in procedure.  If difficult cannulation, then will proceed with giving patient rectal indomethacin.  I had discussed this with patient daughter and patient at time of clinic visit.   [employer]

## 2018-11-01 NOTE — Op Note (Signed)
[facility] Patient Name: Mikayla [name] Procedure Date : 11/01/2018 MRN: [id number] Attending MD: Justice Britain , MD Date of Birth: 26-Mar-1940 CSN: [id number] Age: 78 Admit Type: Inpatient Procedure:                ERCP Indications:              Bile duct stone(s), Biliary dilation on magnetic                            resonance cholangiopancreatography, Bile duct stone                            on magnetic resonance cholangiopancreatography,                            Evaluation and possible treatment of bile duct                            stone(s), Elevated liver enzymes Providers:                Justice Britain, MD, Jeanella Cara, RN,                            Marguerita Merles, Technician, Lazaro Arms, Technician Referring MD:             Bonnita Levan. Trilby Drummer M. Hilarie Fredrickson, MD, Jackquline Denmark, MD Medicines:                General Anesthesia, Cipro 400 mg IV, Glucagon 1 mg                            IV, Indomethacin 100 mg PR (this was given at end                            of procedure due to the difficulty of cannulation -                            previously discussed with patient and daughter due                            to her listed allergy in chart) Complications:            No immediate complications. Estimated Blood Loss:     Estimated blood loss was minimal. Procedure:                Pre-Anesthesia Assessment:                           - Prior to the procedure, a History and Physical                            was performed, and patient medications and                            allergies were reviewed. The patient's tolerance of  previous anesthesia was also reviewed. The risks                            and benefits of the procedure and the sedation                            options and risks were discussed with the patient.                            All questions were answered, and informed consent             was obtained. Prior Anticoagulants: The patient has                            taken Coumadin (warfarin), last dose was 4 days                            prior to procedure. ASA Grade Assessment: III - A                            patient with severe systemic disease. After                            reviewing the risks and benefits, the patient was                            deemed in satisfactory condition to undergo the                            procedure.                           After obtaining informed consent, the scope was                            passed under direct vision. Throughout the                            procedure, the patient's blood pressure, pulse, and                            oxygen saturations were monitored continuously. The                            TJF-Q180V ([id number]) Olympus duodenoscope was                            introduced through the mouth, and used to inject                            contrast into and used to inject contrast into the                            bile duct. The ERCP was technically  difficult and                            complex due to challenging cannulation. Successful                            completion of the procedure was aided by performing                            the maneuvers documented (below) in this report.                            The patient tolerated the procedure. Scope In: Scope Out: Findings:      The scout film was normal.      The upper GI tract was traversed under direct vision without detailed       examination. Patchy mildly erythematous mucosa without bleeding was       found in the gastric body. Biopsies were taken in the gastric body, at       the incisura and in the gastric antrum through the ERCP scope with the       cold forceps for histology and to rule out H. pylori. The major papilla       was normal but its angulation was noted that a short position       duodenoscope could not  have apporpirateposition for likely wire-guided       cannulation.      Using a short, semi-long, and long position, the bile duct could not be       cannulated. Repeated attempts at biliary cannulation were not successful       while using a wire-guided approach. This led to placement of the wire       within what was felt to be the ventral pancreatic duct. Decision was       made to pursue a double-wire approach. The wire was left within the       pancreatic duct but could not be placed in long-position. Repeated       attempts using double-wire technique were performed in       short/semi-long/long position. The bile duct could not be cannulated.       During positioning, the pancreatic duct wire was lost and it was removed.      After greater than 50 minutes of attempts, decision was made to pursue a       biliary pre-cut fistulotomy. This was performed measuring 6 mm in length       and was made with a monofilament needle knife using a freehand technique       using ERBE electrocautery. There was no post-sphincterotomy bleeding. A       short 0.025 inch Revolution Antonietta Breach was passed into the biliary tree       using the needle-knife. I then exchanged to the sphincterotome. The       short-nosed traction sphincterotome was passed over the guidewire and       the bile duct was then deeply cannulated. Contrast was injected. I       personally interpreted the bile duct images. Ductal flow of contrast was       adequate. Image quality was adequate. Contrast extended to the hepatic       ducts. Opacification  of the entire biliary tree except for the cystic       duct and gallbladder was successful. The main bile duct was markedly       dilated. The largest diameter was at least 20 mm. The middle third of       the main bile duct contained filling defect thought to be a stone as       well as sludge. Extension of the biliary fistulotomy was performed. Then       dilation of the distal common  bile duct with an 09-14-08 mm balloon (to a       maximum balloon size of 10 mm) dilator was successful as a       sphincteroplasty in attempt to aid in stone removal. An occlusion       cholangiogram was performed that showed no further significant biliary       pathology other than what was described above. The biliary tree was       swept with a retrieval balloon starting at the bifurcation. Sludge was       swept from the duct. No stones were removed. One stone remained and       continued to be not-capturable. It is likely that we will need to pursue       EHL. Due to the length of time required for today's procedure and having       been an urgent add-on, decision made to pursue stenting and repeat ERCP       in the near future. One 10 Fr by 7 cm plastic biliary stent with a       single external flap and a single internal flap was placed into the       common bile duct. Bile flowed through the stent. The stent was in good       position.      A pancreatogram was not performed.      The duodenoscope was withdrawn from the patient. Impression:               - Erythematous mucosa in the gastric body. Biopsied                            for HP.                           - The major papilla appeared normal but angulation                            axis noted to be lateral.                           - A filling defect consistent with a stone and                            sludge was seen on the cholangiogram.                           - The entire main bile duct was markedly dilated.                           - Choledocholithiasis was found. Partial removal of  sludge was accomplished with biliary fistulotomy,                            sphincterotomy extension, balloon sphincteroplasty,                            balloon sweep and stenting. Recommendation:           - The patient will be observed post-procedure,                            until all discharge  criteria are met.                           - Discharge patient to home.                           - Patient has a contact number available for                            emergencies. The signs and symptoms of potential                            delayed complications were discussed with the                            patient. Return to normal activities tomorrow.                            Written discharge instructions were provided to the                            patient.                           - Check liver enzymes (AST, ALT, alkaline                            phosphatase, bilirubin) in 1 week.                           - Observe patient's clinical course.                           - Watch for pancreatitis, bleeding, perforation,                            and cholangitis.                           - Wait on restarting Coumadin for 72 hours to                            decrease risk of post-sphincterotomy bleeding as                            much  as possible.                           - Await path results.                           - The findings and recommendations were discussed                            with the patient.                           - The findings and recommendations were discussed                            with the patient's family.                           - Repeat ERCP in 6 weeks for retreatment. Procedure Code(s):        --- Professional ---                           [phone], Endoscopic retrograde                            cholangiopancreatography (ERCP); with placement of                            endoscopic stent into biliary or pancreatic duct,                            including pre- and post-dilation and guide wire                            passage, when performed, including sphincterotomy,                            when performed, each stent                           [id number], Endoscopic retrograde                             cholangiopancreatography (ERCP); with removal of                            calculi/debris from biliary/pancreatic duct(s) Diagnosis Code(s):        --- Professional ---                           K31.89, Other diseases of stomach and duodenum                           K80.50, Calculus of bile duct without cholangitis                            or cholecystitis without obstruction  K83.9, Disease of biliary tract, unspecified                           R74.8, Abnormal levels of other serum enzymes                           K83.8, Other specified diseases of biliary tract                           R93.2, Abnormal findings on diagnostic imaging of                            liver and biliary tract CPT copyright 2019 [employer]. All rights reserved. The codes documented in this report are preliminary and upon coder review may  be revised to meet current compliance requirements. Justice Britain, MD 11/01/2018 10:44:04 AM Number of Addenda: 0

## 2018-11-01 NOTE — Telephone Encounter (Signed)
-----   Message from Irving Copas., MD sent at 11/01/2018  1:27 PM EDT ----- Very difficult cannulation but after precut fistulotomy was able to access the biliary tree.  Suspect stone hiding in the much larger upper portion of the bile duct tree.  With the amount of time required to just enter the biliary tree decided to leave a stent to hopefully break down stone a bit and plan for likely EHL in the coming weeks.  She did well post procedure and I have discharged her. Timmey Lamba, please set up a hepatic function panel to be drawn in the next 1 to 1.5 weeks and have that return to myself and/or Dr. Lyndel Safe. Please schedule a ERCP in 4 to 6 weeks with EHL.  Patient will need to be off of Coumadin for 5 days prior to procedure with stat INR to be done that morning. She can be set up in follow-up with Dr. Lyndel Safe or myself for clinic follow-up prior to procedure. Thanks. Gave

## 2018-11-01 NOTE — Anesthesia Postprocedure Evaluation (Signed)
Anesthesia Post Note  Patient: Mikayla [name]  Procedure(s) Performed: ENDOSCOPIC RETROGRADE CHOLANGIOPANCREATOGRAPHY (ERCP) (N/A ) SPHINCTEROTOMY BILIARY DILATION REMOVAL OF STONES BILIARY STENT PLACEMENT BIOPSY     Patient location during evaluation: Endoscopy Anesthesia Type: General Level of consciousness: awake and alert Pain management: pain level controlled Vital Signs Assessment: post-procedure vital signs reviewed and stable Respiratory status: spontaneous breathing, nonlabored ventilation, respiratory function stable and patient connected to nasal cannula oxygen Cardiovascular status: blood pressure returned to baseline and stable Postop Assessment: no apparent nausea or vomiting Anesthetic complications: no    Last Vitals:  Vitals:   11/01/18 1045 11/01/18 1055  BP: (!) 137/49 (!) 144/45  Pulse: (!) 51 (!) 54  Resp: 14 13  Temp:    SpO2: 98% 95%    Last Pain:  Vitals:   11/01/18 1008  TempSrc: Oral  PainSc: 0-No pain                 Kyliah Deanda L Desia Saban

## 2018-11-01 NOTE — Discharge Instructions (Signed)
Hold Coumadin for 72 hours.  Start no sooner than the evening of September 28th to decrease risk of post sphincterotomy bleeding.  YOU HAD AN ENDOSCOPIC PROCEDURE TODAY: Refer to the procedure report and other information in the discharge instructions given to you for any specific questions about what was found during the examination. If this information does not answer your questions, please call [place] office at [phone] to clarify.   YOU SHOULD EXPECT: Some feelings of bloating in the abdomen. Passage of more gas than usual. Walking can help get rid of the air that was put into your GI tract during the procedure and reduce the bloating. If you had a lower endoscopy (such as a colonoscopy or flexible sigmoidoscopy) you may notice spotting of blood in your stool or on the toilet paper. Some abdominal soreness may be present for a day or two, also.  DIET: Your first meal following the procedure should be a light meal and then it is ok to progress to your normal diet. A half-sandwich or bowl of soup is an example of a good first meal. Heavy or fried foods are harder to digest and may make you feel nauseous or bloated. Drink plenty of fluids but you should avoid alcoholic beverages for 24 hours. If you had a esophageal dilation, please see attached instructions for diet.    ACTIVITY: Your care partner should take you home directly after the procedure. You should plan to take it easy, moving slowly for the rest of the day. You can resume normal activity the day after the procedure however YOU SHOULD NOT DRIVE, use power tools, machinery or perform tasks that involve climbing or major physical exertion for 24 hours (because of the sedation medicines used during the test).   SYMPTOMS TO REPORT IMMEDIATELY: A gastroenterologist can be reached at any hour. Please call [phone]  for any of the following symptoms:  Following lower endoscopy (colonoscopy, flexible sigmoidoscopy) Excessive amounts of  blood in the stool  Significant tenderness, worsening of abdominal pains  Swelling of the abdomen that is new, acute  Fever of 100 or higher  Following upper endoscopy (EGD, EUS, ERCP, esophageal dilation) Vomiting of blood or coffee ground material  New, significant abdominal pain  New, significant chest pain or pain under the shoulder blades  Painful or persistently difficult swallowing  New shortness of breath  Black, tarry-looking or red, bloody stools  FOLLOW UP:  If any biopsies were taken you will be contacted by phone or by letter within the next 1-3 weeks. Call [phone]  if you have not heard about the biopsies in 3 weeks.  Please also call with any specific questions about appointments or follow up tests. YOU HAD AN ENDOSCOPIC PROCEDURE TODAY: Refer to the procedure report and other information in the discharge instructions given to you for any specific questions about what was found during the examination. If this information does not answer your questions, please call [place] office at [phone] to clarify.   YOU SHOULD EXPECT: Some feelings of bloating in the abdomen. Passage of more gas than usual. Walking can help get rid of the air that was put into your GI tract during the procedure and reduce the bloating. If you had a lower endoscopy (such as a colonoscopy or flexible sigmoidoscopy) you may notice spotting of blood in your stool or on the toilet paper. Some abdominal soreness may be present for a day or two, also.  DIET: Your first meal following the procedure should be a  light meal and then it is ok to progress to your normal diet. A half-sandwich or bowl of soup is an example of a good first meal. Heavy or fried foods are harder to digest and may make you feel nauseous or bloated. Drink plenty of fluids but you should avoid alcoholic beverages for 24 hours. If you had a esophageal dilation, please see attached instructions for diet.    ACTIVITY: Your care partner  should take you home directly after the procedure. You should plan to take it easy, moving slowly for the rest of the day. You can resume normal activity the day after the procedure however YOU SHOULD NOT DRIVE, use power tools, machinery or perform tasks that involve climbing or major physical exertion for 24 hours (because of the sedation medicines used during the test).   SYMPTOMS TO REPORT IMMEDIATELY: A gastroenterologist can be reached at any hour. Please call [phone]  for any of the following symptoms:  Following lower endoscopy (colonoscopy, flexible sigmoidoscopy) Excessive amounts of blood in the stool  Significant tenderness, worsening of abdominal pains  Swelling of the abdomen that is new, acute  Fever of 100 or higher  Following upper endoscopy (EGD, EUS, ERCP, esophageal dilation) Vomiting of blood or coffee ground material  New, significant abdominal pain  New, significant chest pain or pain under the shoulder blades  Painful or persistently difficult swallowing  New shortness of breath  Black, tarry-looking or red, bloody stools  FOLLOW UP:  If any biopsies were taken you will be contacted by phone or by letter within the next 1-3 weeks. Call [phone]  if you have not heard about the biopsies in 3 weeks.  Please also call with any specific questions about appointments or follow up tests.

## 2018-11-02 ENCOUNTER — Encounter (HOSPITAL_BASED_OUTPATIENT_CLINIC_OR_DEPARTMENT_OTHER): Payer: Self-pay | Admitting: Emergency Medicine

## 2018-11-02 ENCOUNTER — Inpatient Hospital Stay (HOSPITAL_BASED_OUTPATIENT_CLINIC_OR_DEPARTMENT_OTHER)
Admission: EM | Admit: 2018-11-02 | Discharge: 2018-11-07 | DRG: 439 | Disposition: A | Discharge status: Home / Self Care | Payer: Medicare Other | Attending: Family Medicine | Admitting: Family Medicine

## 2018-11-02 ENCOUNTER — Other Ambulatory Visit: Payer: Self-pay

## 2018-11-02 ENCOUNTER — Emergency Department (HOSPITAL_BASED_OUTPATIENT_CLINIC_OR_DEPARTMENT_OTHER): Payer: Medicare Other

## 2018-11-02 DIAGNOSIS — K219 Gastro-esophageal reflux disease without esophagitis: Secondary | ICD-10-CM | POA: Diagnosis present

## 2018-11-02 DIAGNOSIS — Z96642 Presence of left artificial hip joint: Secondary | ICD-10-CM | POA: Diagnosis present

## 2018-11-02 DIAGNOSIS — Z03818 Encounter for observation for suspected exposure to other biological agents ruled out: Secondary | ICD-10-CM | POA: Diagnosis not present

## 2018-11-02 DIAGNOSIS — Z953 Presence of xenogenic heart valve: Secondary | ICD-10-CM

## 2018-11-02 DIAGNOSIS — K838 Other specified diseases of biliary tract: Secondary | ICD-10-CM | POA: Diagnosis not present

## 2018-11-02 DIAGNOSIS — I110 Hypertensive heart disease with heart failure: Secondary | ICD-10-CM | POA: Diagnosis present

## 2018-11-02 DIAGNOSIS — Z885 Allergy status to narcotic agent status: Secondary | ICD-10-CM

## 2018-11-02 DIAGNOSIS — Z20828 Contact with and (suspected) exposure to other viral communicable diseases: Secondary | ICD-10-CM | POA: Diagnosis present

## 2018-11-02 DIAGNOSIS — Z6829 Body mass index (BMI) 29.0-29.9, adult: Secondary | ICD-10-CM

## 2018-11-02 DIAGNOSIS — I739 Peripheral vascular disease, unspecified: Secondary | ICD-10-CM | POA: Diagnosis present

## 2018-11-02 DIAGNOSIS — K859 Acute pancreatitis without necrosis or infection, unspecified: Principal | ICD-10-CM

## 2018-11-02 DIAGNOSIS — I872 Venous insufficiency (chronic) (peripheral): Secondary | ICD-10-CM | POA: Diagnosis present

## 2018-11-02 DIAGNOSIS — Z881 Allergy status to other antibiotic agents status: Secondary | ICD-10-CM

## 2018-11-02 DIAGNOSIS — N200 Calculus of kidney: Secondary | ICD-10-CM | POA: Diagnosis not present

## 2018-11-02 DIAGNOSIS — Z86718 Personal history of other venous thrombosis and embolism: Secondary | ICD-10-CM | POA: Diagnosis not present

## 2018-11-02 DIAGNOSIS — R1013 Epigastric pain: Secondary | ICD-10-CM | POA: Diagnosis not present

## 2018-11-02 DIAGNOSIS — I480 Paroxysmal atrial fibrillation: Secondary | ICD-10-CM | POA: Diagnosis not present

## 2018-11-02 DIAGNOSIS — Z90710 Acquired absence of both cervix and uterus: Secondary | ICD-10-CM

## 2018-11-02 DIAGNOSIS — Y848 Other medical procedures as the cause of abnormal reaction of the patient, or of later complication, without mention of misadventure at the time of the procedure: Secondary | ICD-10-CM | POA: Diagnosis present

## 2018-11-02 DIAGNOSIS — I10 Essential (primary) hypertension: Secondary | ICD-10-CM | POA: Diagnosis present

## 2018-11-02 DIAGNOSIS — H3530 Unspecified macular degeneration: Secondary | ICD-10-CM | POA: Diagnosis present

## 2018-11-02 DIAGNOSIS — K9189 Other postprocedural complications and disorders of digestive system: Secondary | ICD-10-CM

## 2018-11-02 DIAGNOSIS — R112 Nausea with vomiting, unspecified: Secondary | ICD-10-CM | POA: Diagnosis not present

## 2018-11-02 DIAGNOSIS — Z809 Family history of malignant neoplasm, unspecified: Secondary | ICD-10-CM

## 2018-11-02 DIAGNOSIS — Z79891 Long term (current) use of opiate analgesic: Secondary | ICD-10-CM | POA: Diagnosis not present

## 2018-11-02 DIAGNOSIS — E876 Hypokalemia: Secondary | ICD-10-CM | POA: Diagnosis present

## 2018-11-02 DIAGNOSIS — R269 Unspecified abnormalities of gait and mobility: Secondary | ICD-10-CM | POA: Diagnosis present

## 2018-11-02 DIAGNOSIS — Z9049 Acquired absence of other specified parts of digestive tract: Secondary | ICD-10-CM | POA: Diagnosis not present

## 2018-11-02 DIAGNOSIS — K858 Other acute pancreatitis without necrosis or infection: Secondary | ICD-10-CM | POA: Diagnosis not present

## 2018-11-02 DIAGNOSIS — E785 Hyperlipidemia, unspecified: Secondary | ICD-10-CM | POA: Diagnosis present

## 2018-11-02 DIAGNOSIS — Z7901 Long term (current) use of anticoagulants: Secondary | ICD-10-CM

## 2018-11-02 DIAGNOSIS — I5032 Chronic diastolic (congestive) heart failure: Secondary | ICD-10-CM

## 2018-11-02 DIAGNOSIS — K8050 Calculus of bile duct without cholangitis or cholecystitis without obstruction: Secondary | ICD-10-CM | POA: Diagnosis present

## 2018-11-02 DIAGNOSIS — Z8249 Family history of ischemic heart disease and other diseases of the circulatory system: Secondary | ICD-10-CM

## 2018-11-02 DIAGNOSIS — Z9689 Presence of other specified functional implants: Secondary | ICD-10-CM | POA: Diagnosis present

## 2018-11-02 DIAGNOSIS — N281 Cyst of kidney, acquired: Secondary | ICD-10-CM | POA: Diagnosis not present

## 2018-11-02 DIAGNOSIS — Z79899 Other long term (current) drug therapy: Secondary | ICD-10-CM

## 2018-11-02 DIAGNOSIS — E669 Obesity, unspecified: Secondary | ICD-10-CM | POA: Diagnosis present

## 2018-11-02 DIAGNOSIS — I2510 Atherosclerotic heart disease of native coronary artery without angina pectoris: Secondary | ICD-10-CM | POA: Diagnosis present

## 2018-11-02 LAB — URINALYSIS, ROUTINE W REFLEX MICROSCOPIC
Bilirubin Urine: NEGATIVE
Glucose, UA: NEGATIVE mg/dL
Ketones, ur: NEGATIVE mg/dL
Leukocytes,Ua: NEGATIVE
Nitrite: NEGATIVE
Protein, ur: NEGATIVE mg/dL
Specific Gravity, Urine: 1.015 (ref 1.005–1.030)
pH: 5.5 (ref 5.0–8.0)

## 2018-11-02 LAB — COMPREHENSIVE METABOLIC PANEL
ALT: 64 U/L — ABNORMAL HIGH (ref 0–44)
AST: 28 U/L (ref 15–41)
Albumin: 3.7 g/dL (ref 3.5–5.0)
Alkaline Phosphatase: 131 U/L — ABNORMAL HIGH (ref 38–126)
Anion gap: 11 (ref 5–15)
BUN: 18 mg/dL (ref 8–23)
CO2: 25 mmol/L (ref 22–32)
Calcium: 8.9 mg/dL (ref 8.9–10.3)
Chloride: 103 mmol/L (ref 98–111)
Creatinine, Ser: 0.76 mg/dL (ref 0.44–1.00)
GFR calc Af Amer: >60 mL/min (ref >60)
GFR calc non Af Amer: >60 mL/min (ref >60)
Glucose, Bld: 135 mg/dL — ABNORMAL HIGH (ref 70–99)
Potassium: 3.9 mmol/L (ref 3.5–5.1)
Sodium: 139 mmol/L (ref 135–145)
Total Bilirubin: 0.9 mg/dL (ref 0.3–1.2)
Total Protein: 7 g/dL (ref 6.5–8.1)

## 2018-11-02 LAB — CBC WITH DIFFERENTIAL/PLATELET
Abs Immature Granulocytes: 0.07 10*3/uL (ref 0.00–0.07)
Basophils Absolute: 0 10*3/uL (ref 0.0–0.1)
Basophils Relative: 0 %
Eosinophils Absolute: 0 10*3/uL (ref 0.0–0.5)
Eosinophils Relative: 0 %
HCT: 41.9 % (ref 36.0–46.0)
Hemoglobin: 13.1 g/dL (ref 12.0–15.0)
Immature Granulocytes: 1 %
Lymphocytes Relative: 7 %
Lymphs Abs: 1 10*3/uL (ref 0.7–4.0)
MCH: 29.7 pg (ref 26.0–34.0)
MCHC: 31.3 g/dL (ref 30.0–36.0)
MCV: 95 fL (ref 80.0–100.0)
Monocytes Absolute: 0.7 10*3/uL (ref 0.1–1.0)
Monocytes Relative: 5 %
Neutro Abs: 12.4 10*3/uL — ABNORMAL HIGH (ref 1.7–7.7)
Neutrophils Relative %: 87 %
Platelets: 273 10*3/uL (ref 150–400)
RBC: 4.41 MIL/uL (ref 3.87–5.11)
RDW: 13.8 % (ref 11.5–15.5)
WBC: 14.2 10*3/uL — ABNORMAL HIGH (ref 4.0–10.5)
nRBC: 0 % (ref 0.0–0.2)

## 2018-11-02 LAB — URINALYSIS, MICROSCOPIC (REFLEX)

## 2018-11-02 LAB — PROTIME-INR
INR: 1.2 (ref 0.8–1.2)
Prothrombin Time: 15 seconds (ref 11.4–15.2)

## 2018-11-02 LAB — LIPASE, BLOOD: Lipase: 5859 U/L — ABNORMAL HIGH (ref 11–51)

## 2018-11-02 LAB — SARS CORONAVIRUS 2 BY RT PCR (HOSPITAL ORDER, PERFORMED IN ~~LOC~~ HOSPITAL LAB): SARS Coronavirus 2: NEGATIVE

## 2018-11-02 MED ORDER — ACETAMINOPHEN 325 MG PO TABS
650.0000 mg | ORAL_TABLET | Freq: Four times a day (QID) | ORAL | Status: DC | PRN
  Administered 2018-11-04: 650 mg via ORAL
  Filled 2018-11-02: qty 2

## 2018-11-02 MED ORDER — PROMETHAZINE HCL 25 MG/ML IJ SOLN: 12.5000 mg | Freq: Four times a day (QID) | INTRAMUSCULAR | Status: DC | PRN

## 2018-11-02 MED ORDER — IOHEXOL 300 MG/ML  SOLN
80.0000 mL | Freq: Once | INTRAMUSCULAR | Status: AC | PRN
  Administered 2018-11-02: 05:00:00 80 mL via INTRAVENOUS

## 2018-11-02 MED ORDER — METOPROLOL TARTRATE 5 MG/5ML IV SOLN
5.0000 mg | Freq: Four times a day (QID) | INTRAVENOUS | Status: DC
  Administered 2018-11-02 – 2018-11-05 (×11): 5 mg via INTRAVENOUS
  Filled 2018-11-02 (×10): qty 5

## 2018-11-02 MED ORDER — FENTANYL CITRATE (PF) 100 MCG/2ML IJ SOLN
50.0000 ug | INTRAMUSCULAR | Status: DC | PRN
  Administered 2018-11-02 – 2018-11-05 (×5): 50 ug via INTRAVENOUS
  Filled 2018-11-02 (×5): qty 2

## 2018-11-02 MED ORDER — DICYCLOMINE HCL 10 MG PO CAPS
10.0000 mg | ORAL_CAPSULE | Freq: Once | ORAL | Status: AC
  Administered 2018-11-02: 10 mg via ORAL
  Filled 2018-11-02: qty 1

## 2018-11-02 MED ORDER — ACETAMINOPHEN 500 MG PO TABS: 1000.0000 mg | ORAL_TABLET | Freq: Once | ORAL | Status: DC

## 2018-11-02 MED ORDER — ACETAMINOPHEN 650 MG RE SUPP: 650.0000 mg | Freq: Four times a day (QID) | RECTAL | Status: DC | PRN

## 2018-11-02 MED ORDER — ONDANSETRON HCL 4 MG/2ML IJ SOLN
4.0000 mg | Freq: Once | INTRAMUSCULAR | Status: AC
  Administered 2018-11-02: 16:00:00 4 mg via INTRAVENOUS
  Filled 2018-11-02: qty 2

## 2018-11-02 MED ORDER — FENTANYL CITRATE (PF) 100 MCG/2ML IJ SOLN
100.0000 ug | INTRAMUSCULAR | Status: DC | PRN
  Administered 2018-11-02 (×5): 100 ug via INTRAVENOUS
  Filled 2018-11-02 (×6): qty 2

## 2018-11-02 MED ORDER — SODIUM CHLORIDE 0.9 % IV SOLN
INTRAVENOUS | Status: DC
  Administered 2018-11-02 – 2018-11-05 (×7): via INTRAVENOUS

## 2018-11-02 MED ORDER — ONDANSETRON HCL 4 MG/2ML IJ SOLN: 4.0000 mg | Freq: Four times a day (QID) | INTRAMUSCULAR | Status: DC | PRN

## 2018-11-02 MED ORDER — FENTANYL CITRATE (PF) 100 MCG/2ML IJ SOLN
100.0000 ug | Freq: Once | INTRAMUSCULAR | Status: AC
  Administered 2018-11-02: 06:00:00 100 ug via INTRAVENOUS
  Filled 2018-11-02: qty 2

## 2018-11-02 MED ORDER — SIMETHICONE 40 MG/0.6ML PO SUSP (UNIT DOSE): 40.0000 mg | Freq: Once | ORAL | Status: DC

## 2018-11-02 MED ORDER — SODIUM CHLORIDE 0.9% FLUSH
3.0000 mL | Freq: Two times a day (BID) | INTRAVENOUS | Status: DC
  Administered 2018-11-03 – 2018-11-07 (×3): 3 mL via INTRAVENOUS

## 2018-11-02 MED ORDER — ONDANSETRON HCL 4 MG/2ML IJ SOLN
4.0000 mg | Freq: Once | INTRAMUSCULAR | Status: AC
  Administered 2018-11-02: 04:00:00 4 mg via INTRAVENOUS
  Filled 2018-11-02: qty 2

## 2018-11-02 NOTE — Progress Notes (Signed)
Aware of patient, but please call/page our service once patient arrives at [facility].  Thanks Ellouise Newer, PA-C [facility]
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
## 2018-11-02 NOTE — Progress Notes (Signed)
Pt is worried about not having gabapentin 800mg  and omeprazole 40mg  ordered for hospital admission. Discussed with pt that she is NPO and not taking oral medications. Wanted page sent to MD just in case. On-call MD notified for pt. Pt states she understands why she wouldn't take PO meds at this time.
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
## 2018-11-02 NOTE — ED Triage Notes (Signed)
Patient arrived via POV c/o abdominal pain post ERCP. Patient has experienced 5 episodes of emesis with continuous belching. Patient was awakened from sleep at approximately 1900. Patient is AO x 4, with VS WDL, normal gait.

## 2018-11-02 NOTE — ED Notes (Signed)
Date and time results received: 11/02/18 0513 (use smartphrase ".now" to insert current time)  Test: Lipase Critical Value: [place]  Name of Provider Notified: April Palumbo  Orders Received? Or Actions Taken?: Orders Received - See Orders for details
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
## 2018-11-02 NOTE — ED Notes (Signed)
Contacted Dr. Loletha Carrow @ [phone]
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
## 2018-11-03 ENCOUNTER — Encounter (HOSPITAL_COMMUNITY): Payer: Self-pay | Admitting: Gastroenterology

## 2018-11-03 DIAGNOSIS — K859 Acute pancreatitis without necrosis or infection, unspecified: Principal | ICD-10-CM

## 2018-11-03 LAB — CBC
HCT: 33.6 % — ABNORMAL LOW (ref 36.0–46.0)
Hemoglobin: 10.3 g/dL — ABNORMAL LOW (ref 12.0–15.0)
MCH: 30 pg (ref 26.0–34.0)
MCHC: 30.7 g/dL (ref 30.0–36.0)
MCV: 98 fL (ref 80.0–100.0)
Platelets: 191 10*3/uL (ref 150–400)
RBC: 3.43 MIL/uL — ABNORMAL LOW (ref 3.87–5.11)
RDW: 14.2 % (ref 11.5–15.5)
WBC: 12.6 10*3/uL — ABNORMAL HIGH (ref 4.0–10.5)
nRBC: 0 % (ref 0.0–0.2)

## 2018-11-03 LAB — COMPREHENSIVE METABOLIC PANEL
ALT: 44 U/L (ref 0–44)
AST: 20 U/L (ref 15–41)
Albumin: 2.9 g/dL — ABNORMAL LOW (ref 3.5–5.0)
Alkaline Phosphatase: 97 U/L (ref 38–126)
Anion gap: 7 (ref 5–15)
BUN: 16 mg/dL (ref 8–23)
CO2: 22 mmol/L (ref 22–32)
Calcium: 7.5 mg/dL — ABNORMAL LOW (ref 8.9–10.3)
Chloride: 110 mmol/L (ref 98–111)
Creatinine, Ser: 0.46 mg/dL (ref 0.44–1.00)
GFR calc Af Amer: >60 mL/min (ref >60)
GFR calc non Af Amer: >60 mL/min (ref >60)
Glucose, Bld: 74 mg/dL (ref 70–99)
Potassium: 4 mmol/L (ref 3.5–5.1)
Sodium: 139 mmol/L (ref 135–145)
Total Bilirubin: 1.2 mg/dL (ref 0.3–1.2)
Total Protein: 5.3 g/dL — ABNORMAL LOW (ref 6.5–8.1)

## 2018-11-03 LAB — LIPASE, BLOOD: Lipase: 939 U/L — ABNORMAL HIGH (ref 11–51)

## 2018-11-03 NOTE — Progress Notes (Addendum)
Progress Note   Subjective  Chief Complaint: Post ERCP pancreatitis  Patient tells me this morning that she "aches all over".  She thinks this is due to retching and vomiting over the past few days, no further nausea or vomiting last night.  Continues with a generalized abdominal pain, somewhat worse in her epigastrium.  Patient tells me her mouth is dry she has not been able to eat or drink anything.   Objective   Vital signs in last 24 hours: Temp:  [98.4 F (36.9 C)-99 F (37.2 C)] 98.8 F (37.1 C) (09/27 0522) Pulse Rate:  [64-73] 73 (09/27 0522) Resp:  [14-21] 14 (09/27 0522) BP: (139-162)/(52-93) 144/93 (09/27 0522) SpO2:  [92 %-100 %] 92 % (09/27 0522) Last BM Date: 10/31/18 General:   Elderly white female in NAD Heart:  Regular rate and rhythm; no murmurs Lungs: Respirations even and unlabored, lungs CTA bilaterally Abdomen:  Soft, moderate epigastric ttp and nondistended. Normal bowel sounds. Extremities:  Without edema. Neurologic:  Alert and oriented,  grossly normal neurologically. Psych:  Cooperative. Normal mood and affect.  Lab Results: Recent Labs    11/02/18 0358 11/03/18 0603  WBC 14.2* 12.6*  HGB 13.1 10.3*  HCT 41.9 33.6*  PLT 273 191   BMET Recent Labs    11/02/18 0358 11/03/18 0603  NA 139 139  K 3.9 4.0  CL 103 110  CO2 25 22  GLUCOSE 135* 74  BUN 18 16  CREATININE 0.76 0.46  CALCIUM 8.9 7.5*   LFT Recent Labs    11/03/18 0603  PROT 5.3*  ALBUMIN 2.9*  AST 20  ALT 44  ALKPHOS 97  BILITOT 1.2   PT/INR Recent Labs    11/02/18 0358  LABPROT 15.0  INR 1.2    Studies/Results: Ct Abdomen Pelvis W Contrast  Result Date: 11/02/2018 CLINICAL DATA:  Abdominal pain.  ERCP yesterday. EXAM: CT ABDOMEN AND PELVIS WITH CONTRAST TECHNIQUE: Multidetector CT imaging of the abdomen and pelvis was performed using the standard protocol following bolus administration of intravenous contrast. CONTRAST:  73mL OMNIPAQUE IOHEXOL 300 MG/ML   SOLN COMPARISON:  MRI abdomen 10/25/2018 FINDINGS: Lower chest: Status post AVR. Coronary artery calcification is evident. Hepatobiliary: Multiple hepatic cysts are noted. Intra hepatic biliary duct dilatation is similar to recent MRI with pneumobilia on today's exam compatible with the presence of a common bile duct stent. Gallbladder is surgically absent. Pancreas: Pancreatic parenchyma is diffusely edematous with diffuse peripancreatic edema evident. Main pancreatic duct measures up to 4-5 mm diameter. Pancreatic parenchyma enhances throughout. Spleen: No splenomegaly. No focal mass lesion. Adrenals/Urinary Tract: No adrenal nodule or mass. 2.9 cm simple cyst identified interpolar right kidney. Tiny nonobstructing stone identified lower pole left kidney (coronal 41/5). No evidence for hydroureter. The urinary bladder appears normal for the degree of distention. Stomach/Bowel: Stomach is unremarkable. No gastric wall thickening. No evidence of outlet obstruction. Duodenum is normally positioned as is the ligament of Treitz. No small bowel wall thickening. No small bowel dilatation. The terminal ileum is normal. The appendix is not visualized, but there is no edema or inflammation in the region of the cecum. No gross colonic mass. No colonic wall thickening. Diverticuli are seen scattered along the entire length of the colon without CT findings of diverticulitis. Vascular/Lymphatic: There is abdominal aortic atherosclerosis without aneurysm. Celiac axis and SMA have normal imaging features. Portal vein, superior mesenteric vein, and splenic vein are patent. There is no gastrohepatic or hepatoduodenal ligament lymphadenopathy. No intraperitoneal or  retroperitoneal lymphadenopathy. No pelvic sidewall lymphadenopathy. Reproductive: The uterus is surgically absent. There is no adnexal mass. Other: No intraperitoneal free fluid. Edema is identified in the transverse mesocolon compatible with the pancreatitis.  Musculoskeletal: Status post left hip replacement. No worrisome lytic or sclerotic osseous abnormality. IMPRESSION: 1. Edema identified in and diffusely around the pancreas consistent with acute pancreatitis. Pancreatic parenchyma shows enhancement throughout with no features to raise concern for pancreatic necrosis at this time. No evidence for vascular complication. 2. Common bile duct stent appears appropriately positioned with residual intra and extrahepatic biliary duct dilatation. Pneumobilia on today's study is compatible with the presence of the stent. 3. Right renal cysts with tiny nonobstructing left renal stone. 4.  Aortic Atherosclerois (ICD10-170.0) Electronically Signed   By: Misty Stanley M.D.   On: 11/02/2018 06:36   Dg Ercp Biliary & Pancreatic Ducts  Result Date: 11/01/2018 CLINICAL DATA:  78 year old female with a history of choledocholithiasis EXAM: ERCP TECHNIQUE: Multiple spot images obtained with the fluoroscopic device and submitted for interpretation post-procedure. FLUOROSCOPY TIME:  Fluoroscopy Time:  4 minutes 50 seconds COMPARISON:  MR October 25, 2018 FINDINGS: Limited intraoperative fluoroscopic spot images during ERCP. Initial image demonstrates endoscope projecting over the upper abdomen with surgical changes of cholecystectomy. There is then cannulation of the ampulla with partial opacification of the extrahepatic biliary system. Balloon angioplasty performed, with placement of a plastic biliary stent. IMPRESSION: Limited images during ERCP demonstrates angioplasty of the ampulla with placement of a plastic biliary stent, for treatment of choledocholithiasis. Please refer to the dictated operative report for full details of intraoperative findings and procedure. Electronically Signed   By: Corrie Mckusick D.O.   On: 11/01/2018 10:14     Assessment / Plan:   Assessment: 1.  Post ERCP pancreatitis: ERCP 11/01/2018, continues with epigastric pain today 2.  Epigastric pain:  From above  Plan: 1.  Continue IV fluid support, analgesics, antiemetics, bowel rest with only ice chips and water 2.  Please await further recommendations from Dr. Loletha Carrow later today  Thank you for kind consultation, we will continue to follow.    LOS: 1 day   Levin Erp  11/03/2018, 9:19 AM  I have discussed the case with the PA, and that is the plan I formulated. I personally interviewed and examined the patient.   She has more epigastric tenderness today.,  But that is not unusual in the couple days after the initial onset of pancreatitis. Otherwise she looks well, without volume overload chest discomfort or ileus.  She is passing gas but no bowel movements, and has not eaten in a few days.  She is making copious urine. No lipase done today, but that is fine as we will get one with tomorrow's labs.  Continue current management plan.  Dr. Rush Landmark will be updated, and my partner Dr. Elmo Putt will assume the consult service tomorrow.  Nelida Meuse III Office: [phone]

## 2018-11-03 NOTE — Progress Notes (Signed)
PROGRESS NOTE    Mikayla [name]  [id number] DOB: 18-Oct-1940 DOA: 11/02/2018 PCP: Mosie Lukes, MD     Brief Narrative:  Mikayla [name] is a 78 y.o. female with medical history significant of paroxysmal A fib, HTN, HLD, AS s/p TAVR who underwent ERCP with stent placement for choledocholithiasis on 9/25 with Dr. Rush Landmark. Now returns with abdominal pain, nausea, vomiting. She states that post procedure, she had a milkshake, and few hours later started to have epigastric abdominal pain that was a "band-like" pressure. Soon afterward, she started vomiting, about 5 times last night. She presented to [facility] for evaluation. Currently, after receiving pain medication and antiemetic, her symptoms have improved. She denies any fevers, chills, CP, worsening SOB from baseline, diarrhea, edema. Work up revealed lipase 5859, WBC 14.2, CT A/P with edema around pancreas, common bile duct stent appropriately positioned. GI consulted and patient transferred to [facility].   New events last 24 hours / Subjective: States that she is sore on her chest and upper abdomen, from vomiting the other day.  Continues to have bandlike pain in her epigastric abdomen.  No further nausea or vomiting.  Complains of dry mouth.  Assessment & Plan:   Principal Problem:   Acute pancreatitis Active Problems:   Benign essential HTN   Paroxysmal atrial fibrillation (HCC)   Chronic diastolic HF (heart failure) (HCC)   Acute pancreatitis post ERCP -NPO, pain control, antiemetic, IVF -GI following   Essential HTN -Hold Lopressor while NPO. Will order IV metoprolol  Paroxysmal A Fib  -Coumadin on hold for now -Telemetry   Chronic diastolic HF -Stable, echo 10/08/2018 with EF 60-65%   DVT prophylaxis: Continue to hold Coumadin, continue SCD Code Status: Full code Family Communication: None Disposition Plan: Pending clinical improvement   Consultants:   GI  Procedures:   None  Antimicrobials:  Anti-infectives (From  admission, onward)   None        Objective: Vitals:   11/02/18 1244 11/02/18 1700 11/02/18 2048 11/03/18 0522  BP: (!) 145/52 (!) 140/52 (!) 139/53 (!) 144/93  Pulse: 66 64 66 73  Resp: (!) 21 18 16 14   Temp:  98.4 F (36.9 C) 99 F (37.2 C) 98.8 F (37.1 C)  TempSrc:  Oral Oral Oral  SpO2: 99% 100% 94% 92%  Weight:      Height:       No intake or output data in the 24 hours ending 11/03/18 1100 Filed Weights   11/02/18 0342  Weight: 79.4 kg    Examination:  General exam: Appears calm and comfortable  Respiratory system: Clear to auscultation. Respiratory effort normal. No respiratory distress. No conversational dyspnea.  Cardiovascular system: S1 & S2 heard, irregular rhythm. No murmurs. No pedal edema. Gastrointestinal system: Abdomen is nondistended, soft and tender to palpation epigastric Central nervous system: Alert and oriented. No focal neurological deficits. Speech clear.  Extremities: Symmetric in appearance  Skin: No rashes, lesions or ulcers on exposed skin  Psychiatry: Judgement and insight appear normal. Mood & affect appropriate.   Data Reviewed: I have personally reviewed following labs and imaging studies  CBC: Recent Labs  Lab 10/29/18 1442 11/02/18 0358 11/03/18 0603  WBC 7.7 14.2* 12.6*  NEUTROABS 5.2 12.4*  --   HGB 12.7 13.1 10.3*  HCT 38.9 41.9 33.6*  MCV 92.1 95.0 98.0  PLT 250.0 273 [id number]   Basic Metabolic Panel: Recent Labs  Lab 10/29/18 1442 11/02/18 0358 11/03/18 0603  NA 141 139 139  K 4.2 3.9 4.0  CL 104 103 110  CO2 31 25 22   GLUCOSE 109* 135* 74  BUN 13 18 16   CREATININE 0.86 0.76 0.46  CALCIUM 9.3 8.9 7.5*   GFR: Estimated Creatinine Clearance: 58.4 mL/min (by C-G formula based on SCr of 0.46 mg/dL). Liver Function Tests: Recent Labs  Lab 10/29/18 1442 11/02/18 0358 11/03/18 0603  AST 26 28 20   ALT 125* 64* 44  ALKPHOS 165* 131* 97  BILITOT 0.7 0.9 1.2  PROT 6.8 7.0 5.3*  ALBUMIN 3.7 3.7 2.9*   Recent  Labs  Lab 11/02/18 0358  LIPASE 5,859*   No results for input(s): AMMONIA in the last 168 hours. Coagulation Profile: Recent Labs  Lab 10/29/18 10/31/18 11/02/18 0358  INR 2.6 1.8* 1.2   Cardiac Enzymes: No results for input(s): CKTOTAL, CKMB, CKMBINDEX, TROPONINI in the last 168 hours. BNP (last 3 results) No results for input(s): PROBNP in the last 8760 hours. HbA1C: No results for input(s): HGBA1C in the last 72 hours. CBG: No results for input(s): GLUCAP in the last 168 hours. Lipid Profile: No results for input(s): CHOL, HDL, LDLCALC, TRIG, CHOLHDL, LDLDIRECT in the last 72 hours. Thyroid Function Tests: No results for input(s): TSH, T4TOTAL, FREET4, T3FREE, THYROIDAB in the last 72 hours. Anemia Panel: No results for input(s): VITAMINB12, FOLATE, FERRITIN, TIBC, IRON, RETICCTPCT in the last 72 hours. Sepsis Labs: No results for input(s): PROCALCITON, LATICACIDVEN in the last 168 hours.  Recent Results (from the past 240 hour(s))  Novel Coronavirus, NAA (Hosp order, Send-out to Ref Lab; TAT 18-24 hrs     Status: None   Collection Time: 10/29/18 11:05 AM   Specimen: Nasopharyngeal Swab; Respiratory  Result Value Ref Range Status   SARS-CoV-2, NAA NOT DETECTED NOT DETECTED Final    Comment: (NOTE) This nucleic acid amplification test was developed and its performance characteristics determined by [employer]. Nucleic acid amplification tests include PCR and TMA. This test has not been FDA cleared or approved. This test has been authorized by FDA under an Emergency Use Authorization (EUA). This test is only authorized for the duration of time the declaration that circumstances exist justifying the authorization of the emergency use of in vitro diagnostic tests for detection of SARS-CoV-2 virus and/or diagnosis of COVID-19 infection under section 564(b)(1) of the Act, 21 U.S.C. [id number]) (1), unless the authorization is terminated or revoked sooner. When  diagnostic testing is negative, the possibility of a false negative result should be considered in the context of a patient's recent exposures and the presence of clinical signs and symptoms consistent with COVID-19. An individual without symptoms of COVID- 19 and who is not shedding SARS-CoV-2 vi rus would expect to have a negative (not detected) result in this assay. Performed At: [facility] [address] [place], [place] [id number] Rush Farmer MD [id number]    [place]  Final    Comment: Performed at [facility], [place] [address] [place],  [address]  SARS Coronavirus 2 [facility] order, Performed in [facility] hospital lab) Nasopharyngeal Nasopharyngeal Swab     Status: None   Collection Time: 11/02/18  3:58 AM   Specimen: Nasopharyngeal Swab  Result Value Ref Range Status   SARS Coronavirus 2 NEGATIVE NEGATIVE Final    Comment: (NOTE) If result is NEGATIVE SARS-CoV-2 target nucleic acids are NOT DETECTED. The SARS-CoV-2 RNA is generally detectable in upper and lower  respiratory specimens during the acute phase of infection. The lowest  concentration of SARS-CoV-2 viral copies this assay  can detect is 250  copies / mL. A negative result does not preclude SARS-CoV-2 infection  and should not be used as the sole basis for treatment or other  patient management decisions.  A negative result may occur with  improper specimen collection / handling, submission of specimen other  than nasopharyngeal swab, presence of viral mutation(s) within the  areas targeted by this assay, and inadequate number of viral copies  (<250 copies / mL). A negative result must be combined with clinical  observations, patient history, and epidemiological information. If result is POSITIVE SARS-CoV-2 target nucleic acids are DETECTED. The SARS-CoV-2 RNA is generally detectable in upper and lower  respiratory specimens dur ing the acute phase of  infection.  Positive  results are indicative of active infection with SARS-CoV-2.  Clinical  correlation with patient history and other diagnostic information is  necessary to determine patient infection status.  Positive results do  not rule out bacterial infection or co-infection with other viruses. If result is PRESUMPTIVE POSTIVE SARS-CoV-2 nucleic acids MAY BE PRESENT.   A presumptive positive result was obtained on the submitted specimen  and confirmed on repeat testing.  While 2019 novel coronavirus  (SARS-CoV-2) nucleic acids may be present in the submitted sample  additional confirmatory testing may be necessary for epidemiological  and / or clinical management purposes  to differentiate between  SARS-CoV-2 and other Sarbecovirus currently known to infect humans.  If clinically indicated additional testing with an alternate test  methodology [phone]) is advised. The SARS-CoV-2 RNA is generally  detectable in upper and lower respiratory sp ecimens during the acute  phase of infection. The expected result is Negative. Fact Sheet for Patients:  StrictlyIdeas.no Fact Sheet for Healthcare Providers: BankingDealers.co.za This test is not yet approved or cleared by the [place] FDA and has been authorized for detection and/or diagnosis of SARS-CoV-2 by FDA under an Emergency Use Authorization (EUA).  This EUA will remain in effect (meaning this test can be used) for the duration of the COVID-19 declaration under Section 564(b)(1) of the Act, 21 U.S.C. section 360bbb-3(b)(1), unless the authorization is terminated or revoked sooner. Performed at [facility], [address] [place], [place] [address]       Radiology Studies: Ct Abdomen Pelvis W Contrast  Result Date: 11/02/2018 CLINICAL DATA:  Abdominal pain.  ERCP yesterday. EXAM: CT ABDOMEN AND PELVIS WITH CONTRAST TECHNIQUE: Multidetector CT imaging of the  abdomen and pelvis was performed using the standard protocol following bolus administration of intravenous contrast. CONTRAST:  22mL OMNIPAQUE IOHEXOL 300 MG/ML  SOLN COMPARISON:  MRI abdomen 10/25/2018 FINDINGS: Lower chest: Status post AVR. Coronary artery calcification is evident. Hepatobiliary: Multiple hepatic cysts are noted. Intra hepatic biliary duct dilatation is similar to recent MRI with pneumobilia on today's exam compatible with the presence of a common bile duct stent. Gallbladder is surgically absent. Pancreas: Pancreatic parenchyma is diffusely edematous with diffuse peripancreatic edema evident. Main pancreatic duct measures up to 4-5 mm diameter. Pancreatic parenchyma enhances throughout. Spleen: No splenomegaly. No focal mass lesion. Adrenals/Urinary Tract: No adrenal nodule or mass. 2.9 cm simple cyst identified interpolar right kidney. Tiny nonobstructing stone identified lower pole left kidney (coronal 41/5). No evidence for hydroureter. The urinary bladder appears normal for the degree of distention. Stomach/Bowel: Stomach is unremarkable. No gastric wall thickening. No evidence of outlet obstruction. Duodenum is normally positioned as is the ligament of Treitz. No small bowel wall thickening. No small bowel dilatation. The terminal ileum is  normal. The appendix is not visualized, but there is no edema or inflammation in the region of the cecum. No gross colonic mass. No colonic wall thickening. Diverticuli are seen scattered along the entire length of the colon without CT findings of diverticulitis. Vascular/Lymphatic: There is abdominal aortic atherosclerosis without aneurysm. Celiac axis and SMA have normal imaging features. Portal vein, superior mesenteric vein, and splenic vein are patent. There is no gastrohepatic or hepatoduodenal ligament lymphadenopathy. No intraperitoneal or retroperitoneal lymphadenopathy. No pelvic sidewall lymphadenopathy. Reproductive: The uterus is surgically  absent. There is no adnexal mass. Other: No intraperitoneal free fluid. Edema is identified in the transverse mesocolon compatible with the pancreatitis. Musculoskeletal: Status post left hip replacement. No worrisome lytic or sclerotic osseous abnormality. IMPRESSION: 1. Edema identified in and diffusely around the pancreas consistent with acute pancreatitis. Pancreatic parenchyma shows enhancement throughout with no features to raise concern for pancreatic necrosis at this time. No evidence for vascular complication. 2. Common bile duct stent appears appropriately positioned with residual intra and extrahepatic biliary duct dilatation. Pneumobilia on today's study is compatible with the presence of the stent. 3. Right renal cysts with tiny nonobstructing left renal stone. 4.  Aortic Atherosclerois (ICD10-170.0) Electronically Signed   By: Misty Stanley M.D.   On: 11/02/2018 06:36      Scheduled Meds: . metoprolol tartrate  5 mg Intravenous Q6H  . sodium chloride flush  3 mL Intravenous Q12H   Continuous Infusions: . sodium chloride 100 mL/hr at 11/02/18 1151     LOS: 1 day      Time spent: 25 minutes   Dessa Phi, DO [facility] 11/03/2018, 11:00 AM   Available via Epic secure chat 7am-7pm After these hours, please refer to coverage provider listed on [email or web address]

## 2018-11-04 DIAGNOSIS — K9189 Other postprocedural complications and disorders of digestive system: Secondary | ICD-10-CM

## 2018-11-04 LAB — COMPREHENSIVE METABOLIC PANEL
ALT: 37 U/L (ref 0–44)
AST: 20 U/L (ref 15–41)
Albumin: 2.7 g/dL — ABNORMAL LOW (ref 3.5–5.0)
Alkaline Phosphatase: 98 U/L (ref 38–126)
Anion gap: 12 (ref 5–15)
BUN: 17 mg/dL (ref 8–23)
CO2: 15 mmol/L — ABNORMAL LOW (ref 22–32)
Calcium: 7.8 mg/dL — ABNORMAL LOW (ref 8.9–10.3)
Chloride: 113 mmol/L — ABNORMAL HIGH (ref 98–111)
Creatinine, Ser: 0.52 mg/dL (ref 0.44–1.00)
GFR calc Af Amer: >60 mL/min (ref >60)
GFR calc non Af Amer: >60 mL/min (ref >60)
Glucose, Bld: 50 mg/dL — ABNORMAL LOW (ref 70–99)
Potassium: 3.8 mmol/L (ref 3.5–5.1)
Sodium: 140 mmol/L (ref 135–145)
Total Bilirubin: 1.2 mg/dL (ref 0.3–1.2)
Total Protein: 5.4 g/dL — ABNORMAL LOW (ref 6.5–8.1)

## 2018-11-04 LAB — LIPASE, BLOOD: Lipase: 48 U/L (ref 11–51)

## 2018-11-04 LAB — GLUCOSE, CAPILLARY
Glucose-Capillary: 39 mg/dL — CL (ref 70–99)
Glucose-Capillary: 62 mg/dL — ABNORMAL LOW (ref 70–99)
Glucose-Capillary: 115 mg/dL — ABNORMAL HIGH (ref 70–99)
Glucose-Capillary: 103 mg/dL — ABNORMAL HIGH (ref 70–99)

## 2018-11-04 LAB — PHOSPHORUS: Phosphorus: 2.5 mg/dL (ref 2.5–4.6)

## 2018-11-04 LAB — CBC
HCT: 34.4 % — ABNORMAL LOW (ref 36.0–46.0)
Hemoglobin: 10.7 g/dL — ABNORMAL LOW (ref 12.0–15.0)
MCH: 30.7 pg (ref 26.0–34.0)
MCHC: 31.1 g/dL (ref 30.0–36.0)
MCV: 98.9 fL (ref 80.0–100.0)
Platelets: 171 10*3/uL (ref 150–400)
RBC: 3.48 MIL/uL — ABNORMAL LOW (ref 3.87–5.11)
RDW: 14.1 % (ref 11.5–15.5)
WBC: 13.3 10*3/uL — ABNORMAL HIGH (ref 4.0–10.5)
nRBC: 0 % (ref 0.0–0.2)

## 2018-11-04 LAB — MAGNESIUM: Magnesium: 2.2 mg/dL (ref 1.7–2.4)

## 2018-11-04 MED ORDER — WARFARIN SODIUM 5 MG PO TABS
5.0000 mg | ORAL_TABLET | Freq: Once | ORAL | Status: AC
  Administered 2018-11-04: 19:00:00 5 mg via ORAL
  Filled 2018-11-04: qty 1

## 2018-11-04 MED ORDER — LIP MEDEX EX OINT
TOPICAL_OINTMENT | CUTANEOUS | Status: AC
  Administered 2018-11-04: 13:00:00
  Filled 2018-11-04: qty 7

## 2018-11-04 MED ORDER — DEXTROSE 50 % IV SOLN
INTRAVENOUS | Status: AC
  Administered 2018-11-04: 25 mL
  Filled 2018-11-04: qty 50

## 2018-11-04 MED ORDER — WARFARIN - PHARMACIST DOSING INPATIENT: Freq: Every day | Status: DC

## 2018-11-04 NOTE — Progress Notes (Addendum)
Hypoglycemic Event  CBG: 39  Treatment: 8 oz juice/soda  Symptoms: None  Follow-up CBG: Time:1315 CBG Result: 62  Possible Reasons for Event: Inadequate meal intake and Other: Pt not know to have Diabetes  Comments/MD notified:Choi MD paged via Amion  After rechecking CBG, pt was given 44mls of Dextrose per eMAR. Will follow up and recheck CBG, MD made aware.  [name] A [employer]
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
## 2018-11-04 NOTE — Progress Notes (Signed)
PROGRESS NOTE    Mikayla [name]  [id number] DOB: [birth year] DOA: 11/02/2018 PCP: Mosie Lukes, MD     Brief Narrative:  Mikayla [name] is a 78 y.o. female with medical history significant of paroxysmal A fib, HTN, HLD, AS s/p TAVR who underwent ERCP with stent placement for choledocholithiasis on 9/25 with Dr. Rush Landmark. Now returns with abdominal pain, nausea, vomiting. She states that post procedure, she had a milkshake, and few hours later started to have epigastric abdominal pain that was a "band-like" pressure. Soon afterward, she started vomiting, about 5 times last night. She presented to [facility] for evaluation. Currently, after receiving pain medication and antiemetic, her symptoms have improved. She denies any fevers, chills, CP, worsening SOB from baseline, diarrhea, edema. Work up revealed lipase 5859, WBC 14.2, CT A/P with edema around pancreas, common bile duct stent appropriately positioned. GI consulted and patient transferred to [facility].   New events last 24 hours / Subjective: No new complaints, worried about p.o. intake.  Pain has not been any worse, complains of some soreness still.  Denies any further nausea or vomiting since hospitalization.  Assessment & Plan:   Principal Problem:   Acute pancreatitis Active Problems:   Benign essential HTN   Paroxysmal atrial fibrillation (HCC)   Chronic diastolic HF (heart failure) (HCC)   Acute pancreatitis post ERCP -GI following -Lipase 5859 --> 939 --> 48  -Advance to clear liquids today   Essential HTN -Hold Lopressor while NPO. Will order IV metoprolol  Paroxysmal A Fib  -Resume Coumadin -Telemetry   Status post TAVR -Resume Coumadin  Chronic diastolic HF -Stable, echo 10/08/2018 with EF 60-65%   DVT prophylaxis: Coumadin Code Status: Full code Family Communication: None Disposition Plan: Pending clinical improvement   Consultants:   GI  Procedures:   None  Antimicrobials:  Anti-infectives (From  admission, onward)   None       Objective: Vitals:   11/03/18 1429 11/03/18 1731 11/03/18 2048 11/04/18 0414  BP: (!) 147/64 (!) 146/60 124/73 (!) 155/67  Pulse: 78 77 (!) 106 70  Resp: 20 20 19 19   Temp: 98.4 F (36.9 C) 97.7 F (36.5 C) 99 F (37.2 C) 98.5 F (36.9 C)  TempSrc: Oral  Oral Oral  SpO2: 94% 93% 91% 96%  Weight:      Height:        Intake/Output Summary (Last 24 hours) at 11/04/2018 1238 Last data filed at 11/04/2018 0453 Gross per 24 hour  Intake 2363.53 ml  Output -  Net 2363.53 ml   Filed Weights   11/02/18 0342  Weight: 79.4 kg    Examination: General exam: Appears calm and comfortable  Respiratory system: Clear to auscultation. Respiratory effort normal. Cardiovascular system: S1 & S2 heard, RRR. No JVD, murmurs, rubs, gallops or clicks. No pedal edema. Gastrointestinal system: Abdomen is nondistended, soft and mildly TTP epigastric. No organomegaly or masses felt. Normal bowel sounds heard. Central nervous system: Alert and oriented. No focal neurological deficits. Extremities: Symmetric 5 x 5 power. Skin: No rashes, lesions or ulcers Psychiatry: Judgement and insight appear normal. Mood & affect appropriate.    Data Reviewed: I have personally reviewed following labs and imaging studies  CBC: Recent Labs  Lab 10/29/18 1442 11/02/18 0358 11/03/18 0603 11/04/18 0558  WBC 7.7 14.2* 12.6* 13.3*  NEUTROABS 5.2 12.4*  --   --   HGB 12.7 13.1 10.3* 10.7*  HCT 38.9 41.9 33.6* 34.4*  MCV 92.1 95.0 98.0 98.9  PLT 250.0 273 191  [id number]   Basic Metabolic Panel: Recent Labs  Lab 10/29/18 1442 11/02/18 0358 11/03/18 0603 11/04/18 0554  NA 141 139 139 140  K 4.2 3.9 4.0 3.8  CL 104 103 110 113*  CO2 31 25 22  15*  GLUCOSE 109* 135* 74 50*  BUN 13 18 16 17   CREATININE 0.86 0.76 0.46 0.52  CALCIUM 9.3 8.9 7.5* 7.8*  MG  --   --   --  2.2  PHOS  --   --   --  2.5   GFR: Estimated Creatinine Clearance: 58.4 mL/min (by C-G formula based on  SCr of 0.52 mg/dL). Liver Function Tests: Recent Labs  Lab 10/29/18 1442 11/02/18 0358 11/03/18 0603 11/04/18 0554  AST 26 28 20 20   ALT 125* 64* 44 37  ALKPHOS 165* 131* 97 98  BILITOT 0.7 0.9 1.2 1.2  PROT 6.8 7.0 5.3* 5.4*  ALBUMIN 3.7 3.7 2.9* 2.7*   Recent Labs  Lab 11/02/18 0358 11/03/18 0603 11/04/18 0558  LIPASE 5,859* 939* 48   No results for input(s): AMMONIA in the last 168 hours. Coagulation Profile: Recent Labs  Lab 10/29/18 10/31/18 11/02/18 0358  INR 2.6 1.8* 1.2   Cardiac Enzymes: No results for input(s): CKTOTAL, CKMB, CKMBINDEX, TROPONINI in the last 168 hours. BNP (last 3 results) No results for input(s): PROBNP in the last 8760 hours. HbA1C: No results for input(s): HGBA1C in the last 72 hours. CBG: No results for input(s): GLUCAP in the last 168 hours. Lipid Profile: No results for input(s): CHOL, HDL, LDLCALC, TRIG, CHOLHDL, LDLDIRECT in the last 72 hours. Thyroid Function Tests: No results for input(s): TSH, T4TOTAL, FREET4, T3FREE, THYROIDAB in the last 72 hours. Anemia Panel: No results for input(s): VITAMINB12, FOLATE, FERRITIN, TIBC, IRON, RETICCTPCT in the last 72 hours. Sepsis Labs: No results for input(s): PROCALCITON, LATICACIDVEN in the last 168 hours.  Recent Results (from the past 240 hour(s))  Novel Coronavirus, NAA (Hosp order, Send-out to Ref Lab; TAT 18-24 hrs     Status: None   Collection Time: 10/29/18 11:05 AM   Specimen: Nasopharyngeal Swab; Respiratory  Result Value Ref Range Status   SARS-CoV-2, NAA NOT DETECTED NOT DETECTED Final    Comment: (NOTE) This nucleic acid amplification test was developed and its performance characteristics determined by [employer]. Nucleic acid amplification tests include PCR and TMA. This test has not been FDA cleared or approved. This test has been authorized by FDA under an Emergency Use Authorization (EUA). This test is only authorized for the duration of time the  declaration that circumstances exist justifying the authorization of the emergency use of in vitro diagnostic tests for detection of SARS-CoV-2 virus and/or diagnosis of COVID-19 infection under section 564(b)(1) of the Act, 21 U.S.C. [id number]) (1), unless the authorization is terminated or revoked sooner. When diagnostic testing is negative, the possibility of a false negative result should be considered in the context of a patient's recent exposures and the presence of clinical signs and symptoms consistent with COVID-19. An individual without symptoms of COVID- 19 and who is not shedding SARS-CoV-2 vi rus would expect to have a negative (not detected) result in this assay. Performed At: [facility] [address] [place], [place] [id number] Rush Farmer MD [id number]    [place]  Final    Comment: Performed at [facility], [place] [address] [place], [place] [address]  SARS Coronavirus 2 [facility] order, Performed in [facility] hospital lab) Nasopharyngeal Nasopharyngeal Swab  Status: None   Collection Time: 11/02/18  3:58 AM   Specimen: Nasopharyngeal Swab  Result Value Ref Range Status   SARS Coronavirus 2 NEGATIVE NEGATIVE Final    Comment: (NOTE) If result is NEGATIVE SARS-CoV-2 target nucleic acids are NOT DETECTED. The SARS-CoV-2 RNA is generally detectable in upper and lower  respiratory specimens during the acute phase of infection. The lowest  concentration of SARS-CoV-2 viral copies this assay can detect is 250  copies / mL. A negative result does not preclude SARS-CoV-2 infection  and should not be used as the sole basis for treatment or other  patient management decisions.  A negative result may occur with  improper specimen collection / handling, submission of specimen other  than nasopharyngeal swab, presence of viral mutation(s) within the  areas targeted by this assay, and inadequate number of viral copies  (<250  copies / mL). A negative result must be combined with clinical  observations, patient history, and epidemiological information. If result is POSITIVE SARS-CoV-2 target nucleic acids are DETECTED. The SARS-CoV-2 RNA is generally detectable in upper and lower  respiratory specimens dur ing the acute phase of infection.  Positive  results are indicative of active infection with SARS-CoV-2.  Clinical  correlation with patient history and other diagnostic information is  necessary to determine patient infection status.  Positive results do  not rule out bacterial infection or co-infection with other viruses. If result is PRESUMPTIVE POSTIVE SARS-CoV-2 nucleic acids MAY BE PRESENT.   A presumptive positive result was obtained on the submitted specimen  and confirmed on repeat testing.  While 2019 novel coronavirus  (SARS-CoV-2) nucleic acids may be present in the submitted sample  additional confirmatory testing may be necessary for epidemiological  and / or clinical management purposes  to differentiate between  SARS-CoV-2 and other Sarbecovirus currently known to infect humans.  If clinically indicated additional testing with an alternate test  methodology [phone]) is advised. The SARS-CoV-2 RNA is generally  detectable in upper and lower respiratory sp ecimens during the acute  phase of infection. The expected result is Negative. Fact Sheet for Patients:  StrictlyIdeas.no Fact Sheet for Healthcare Providers: BankingDealers.co.za This test is not yet approved or cleared by the [place] FDA and has been authorized for detection and/or diagnosis of SARS-CoV-2 by FDA under an Emergency Use Authorization (EUA).  This EUA will remain in effect (meaning this test can be used) for the duration of the COVID-19 declaration under Section 564(b)(1) of the Act, 21 U.S.C. section 360bbb-3(b)(1), unless the authorization is terminated or revoked  sooner. Performed at [facility], [address], [place], [place] [address]       Radiology Studies: No results found.    Scheduled Meds: . metoprolol tartrate  5 mg Intravenous Q6H  . sodium chloride flush  3 mL Intravenous Q12H   Continuous Infusions: . sodium chloride 100 mL/hr at 11/04/18 0038     LOS: 2 days      Time spent: 25 minutes   Dessa Phi, DO [facility] 11/04/2018, 12:38 PM   Available via Epic secure chat 7am-7pm After these hours, please refer to coverage provider listed on [email or web address]

## 2018-11-04 NOTE — Progress Notes (Signed)
Patient ID: Mikayla [name], female   DOB: 05-Feb-1941, 78 y.o.   MRN: [id number]    Progress Note   Subjective   Day # 3 CC: Post ERCP pancreatitis.  Status post ERCP with stone extraction and stent placement 11/01/2018  Feeling better, has not been requiring any pain medicine.  Not much appetite but would like to try something.  Has had some intermittent nausea no vomiting.   Lipase normalized at 48 LFTs within normal limits Creatinine 0.5 WBC 13.3   Objective   Vital signs in last 24 hours: Temp:  [97.7 F (36.5 C)-99 F (37.2 C)] 98.5 F (36.9 C) (09/28 0414) Pulse Rate:  [70-106] 70 (09/28 0414) Resp:  [19-20] 19 (09/28 0414) BP: (124-155)/(60-73) 155/67 (09/28 0414) SpO2:  [91 %-96 %] 96 % (09/28 0414) Last BM Date: 10/31/18 General:   Elderly white female in NAD Heart:  Regular rate and rhythm; no murmurs Lungs: Respirations even and unlabored, lungs CTA bilaterally Abdomen:  Soft, mildly tender across upper abdomen ,and nondistended. Normal bowel sounds. Extremities:  Without edema. Neurologic:  Alert and oriented,  grossly normal neurologically. Psych:  Cooperative. Normal mood and affect.  Intake/Output from previous day: 09/27 0701 - 09/28 0700 In: 2363.5 [I.V.:2363.5] Out: -  Intake/Output this shift: No intake/output data recorded.  Lab Results: Recent Labs    11/02/18 0358 11/03/18 0603 11/04/18 0558  WBC 14.2* 12.6* 13.3*  HGB 13.1 10.3* 10.7*  HCT 41.9 33.6* 34.4*  PLT 273 191 171   BMET Recent Labs    11/02/18 0358 11/03/18 0603 11/04/18 0554  NA 139 139 140  K 3.9 4.0 3.8  CL 103 110 113*  CO2 25 22 15*  GLUCOSE 135* 74 50*  BUN 18 16 17   CREATININE 0.76 0.46 0.52  CALCIUM 8.9 7.5* 7.8*   LFT Recent Labs    11/04/18 0554  PROT 5.4*  ALBUMIN 2.7*  AST 20  ALT 37  ALKPHOS 98  BILITOT 1.2   PT/INR Recent Labs    11/02/18 0358  LABPROT 15.0  INR 1.2         Assessment / Plan:    #31 78 year old white female, admitted  with post ERCP pancreatitis status post ERCP with stone extraction and stent placement 11/01/2018.  She is improving, lipase is normalized, LFTs normalized- has persistent mild leukocytosis.  #2 chronic anticoagulation-on Coumadin, history of atrial fibrillation  Plan; we will start clear liquid diet today, advance to full liquids as tolerated. Patient was asking about being discharged today.  We discussed potential discharge tomorrow if she can keep down p.o.'s. Repeat CBC in a.m. Urged patient to stay out of bed and ambulate in room. Okay to resume Coumadin today She will need repeat ERCP in 6 weeks with Dr. Rush Landmark    Principal Problem:   Acute pancreatitis Active Problems:   Benign essential HTN   Paroxysmal atrial fibrillation (HCC)   Chronic diastolic HF (heart failure) ([place])     LOS: 2 days   Mikayla Castille EsterwoodPA-C  11/04/2018, 11:51 AM

## 2018-11-05 ENCOUNTER — Encounter: Payer: Self-pay | Admitting: Gastroenterology

## 2018-11-05 LAB — SURGICAL PATHOLOGY

## 2018-11-05 LAB — GLUCOSE, CAPILLARY
Glucose-Capillary: 88 mg/dL (ref 70–99)
Glucose-Capillary: 114 mg/dL — ABNORMAL HIGH (ref 70–99)

## 2018-11-05 LAB — CBC
HCT: 33.2 % — ABNORMAL LOW (ref 36.0–46.0)
Hemoglobin: 10.6 g/dL — ABNORMAL LOW (ref 12.0–15.0)
MCH: 30.1 pg (ref 26.0–34.0)
MCHC: 31.9 g/dL (ref 30.0–36.0)
MCV: 94.3 fL (ref 80.0–100.0)
Platelets: 198 10*3/uL (ref 150–400)
RBC: 3.52 MIL/uL — ABNORMAL LOW (ref 3.87–5.11)
RDW: 14.1 % (ref 11.5–15.5)
WBC: 16.1 10*3/uL — ABNORMAL HIGH (ref 4.0–10.5)
nRBC: 0 % (ref 0.0–0.2)

## 2018-11-05 LAB — PROTIME-INR
INR: 2.2 — ABNORMAL HIGH (ref 0.8–1.2)
Prothrombin Time: 24.2 seconds — ABNORMAL HIGH (ref 11.4–15.2)

## 2018-11-05 MED ORDER — METOPROLOL TARTRATE 5 MG/5ML IV SOLN
2.5000 mg | INTRAVENOUS | Status: DC | PRN
  Administered 2018-11-05: 2.5 mg via INTRAVENOUS
  Filled 2018-11-05: qty 5

## 2018-11-05 MED ORDER — CALCIUM CARBONATE ANTACID 500 MG PO CHEW
1.0000 | CHEWABLE_TABLET | Freq: Three times a day (TID) | ORAL | Status: DC | PRN
  Administered 2018-11-05: 03:00:00 200 mg via ORAL
  Filled 2018-11-05: qty 1

## 2018-11-05 MED ORDER — FENTANYL CITRATE (PF) 100 MCG/2ML IJ SOLN
100.0000 ug | INTRAMUSCULAR | Status: DC | PRN
  Administered 2018-11-05 (×5): 100 ug via INTRAVENOUS
  Filled 2018-11-05 (×5): qty 2

## 2018-11-05 MED ORDER — DIGOXIN 0.25 MG/ML IJ SOLN
0.5000 mg | Freq: Once | INTRAMUSCULAR | Status: DC
  Filled 2018-11-05: qty 2

## 2018-11-05 MED ORDER — WARFARIN 0.5 MG HALF TABLET
0.5000 mg | ORAL_TABLET | Freq: Once | ORAL | Status: AC
  Administered 2018-11-05: 18:00:00 0.5 mg via ORAL
  Filled 2018-11-05: qty 1

## 2018-11-05 MED ORDER — METOPROLOL TARTRATE 5 MG/5ML IV SOLN
2.5000 mg | Freq: Once | INTRAVENOUS | Status: AC
  Administered 2018-11-05: 03:00:00 2.5 mg via INTRAVENOUS
  Filled 2018-11-05: qty 5

## 2018-11-05 MED ORDER — GABAPENTIN 400 MG PO CAPS
800.0000 mg | ORAL_CAPSULE | Freq: Two times a day (BID) | ORAL | Status: DC
  Administered 2018-11-05 – 2018-11-07 (×4): 800 mg via ORAL
  Filled 2018-11-05 (×4): qty 2

## 2018-11-05 MED ORDER — METOPROLOL TARTRATE 5 MG/5ML IV SOLN: 5.0000 mg | Freq: Once | INTRAVENOUS | Status: DC

## 2018-11-05 MED ORDER — METOPROLOL TARTRATE 12.5 MG HALF TABLET
12.5000 mg | ORAL_TABLET | Freq: Two times a day (BID) | ORAL | Status: DC
  Administered 2018-11-05 – 2018-11-07 (×5): 12.5 mg via ORAL
  Filled 2018-11-05 (×5): qty 1

## 2018-11-05 NOTE — Progress Notes (Signed)
Pt in Afib for past 1-2 hours following NSR. HR ranging 120's-140's. Pt c/o of frequent uncontrollable belching and abdominal pain at 9/10 unrelieved by current PRN Fentanyl. Provider paged, awaiting further instruction/ orders.

## 2018-11-05 NOTE — Evaluation (Signed)
Physical Therapy Evaluation Patient Details Name: Mikayla [name] MRN: [id number] DOB: [birth year] Today's Date: 11/05/2018   History of Present Illness  Patient is a 78 y.o. female with medical history significant of paroxysmal A fib, HTN, HLD, AS s/p TAVR who underwent ERCP with stent placement for choledocholithiasis on 9/25 with Dr. Rush Landmark. She returned on 11/02/18 with abdominal pain, nausea, vomiting. Pt has been admitted with primary diagnosis of Acute pancreatitis after ERCP.    Clinical Impression  Mikayla [name] is a 78 y.o. female admitted with above HPI and diagnosis. She reports modified independence at baseline with mobility using SBQC. She lives with her daughter who is working from home and can assist her as needed throughout the day. She currently is limited by functional impairments (see PT Problem list below) and requires min guard/assist for transfers and gait with cane. She was able to ambulate ~ 80 feet and required 2 standing rest breaks due to fatigue and SOB, she denied chest pain and dizziness with activity. She will benefit from additional skilled PT at below setting to address impairments and improve functional independence and activity tolerance. Acute PT will continue to progress as able throughout stay. Anticipate she will be safe for discharge home when medically ready as long as her daughter can provide assistance/supervision with all mobility.    Follow Up Recommendations Home health PT    Equipment Recommendations  None recommended by PT    Recommendations for Other Services       Precautions / Restrictions Precautions Precautions: Fall Restrictions Weight Bearing Restrictions: No      Mobility  Bed Mobility Overal bed mobility: Needs Assistance Bed Mobility: Supine to Sit     Supine to sit: Supervision;HOB elevated     General bed mobility comments: cues for sequencing and technique to roll to Rt side and press up to sit EOB, min assist for LE  movement and to initiate press up  Transfers Overall transfer level: Needs assistance Equipment used: Quad cane Transfers: Sit to/from Stand Sit to Stand: Min guard         General transfer comment: min guard for safety to stand from EOB, no cues needed  Ambulation/Gait Ambulation/Gait assistance: Min assist;Min guard Gait Distance (Feet): 80 Feet Assistive device: Quad cane   Gait velocity: decreased and labored   General Gait Details: pt using cane in Rt hand with good sequencing to keep with Lt stepping. Pt required 2 standing rest breaks throughout ambulation and required min assist intermitently to steady balance, cues throughout for pursed lip breathing to prevent SOB, pt denied chest pain throughout but did report SOB and fatigue  Stairs            Wheelchair Mobility    Modified Rankin (Stroke Patients Only)       Balance Overall balance assessment: Needs assistance Sitting-balance support: No upper extremity supported;Feet supported Sitting balance-Leahy Scale: Good     Standing balance support: Single extremity supported;During functional activity Standing balance-Leahy Scale: Fair Standing balance comment: pt requires UE support for dynamic standing and gait                Pertinent Vitals/Pain Pain Assessment: Faces Faces Pain Scale: Hurts even more Pain Location: abdomen Pain Descriptors / Indicators: Aching;Discomfort;Grimacing;Guarding Pain Intervention(s): Limited activity within patient's tolerance;Monitored during session;Repositioned    Home Living Family/patient expects to be discharged to:: Private residence Living Arrangements: Children(pt lives with daughter) Available Help at Discharge: Family Type of Home: House Home Access: Stairs to enter  Entrance Stairs-Rails: None Entrance Stairs-Number of Steps: pt has a step down to the deck and then straight into her house, no railing Home Layout: One level Home Equipment: Walker - 2  wheels;Cane - quad;Bedside commode;Tub bench      Prior Function Level of Independence: Independent with assistive device(s)         Comments: pt reports she uses a small based quad cane at home     Hand Dominance   Dominant Hand: Right    Extremity/Trunk Assessment   Upper Extremity Assessment Upper Extremity Assessment: Generalized weakness    Lower Extremity Assessment Lower Extremity Assessment: Generalized weakness       Communication   Communication: No difficulties  Cognition Arousal/Alertness: Awake/alert Behavior During Therapy: WFL for tasks assessed/performed Overall Cognitive Status: Within Functional Limits for tasks assessed               General Comments      Exercises     Assessment/Plan    PT Assessment Patient needs continued PT services  PT Problem List Decreased strength;Decreased balance;Decreased mobility;Decreased activity tolerance       PT Treatment Interventions DME instruction;Functional mobility training;Balance training;Patient/family education;Gait training;Therapeutic activities;Therapeutic exercise;Stair training    PT Goals (Current goals can be found in the Care Plan section)  Acute Rehab PT Goals Patient Stated Goal: to return home with improved strength and independence PT Goal Formulation: With patient Time For Goal Achievement: 11/19/18 Potential to Achieve Goals: Good    Frequency Min 3X/week        AM-PAC PT "6 Clicks" Mobility  Outcome Measure Help needed turning from your back to your side while in a flat bed without using bedrails?: A Little Help needed moving from lying on your back to sitting on the side of a flat bed without using bedrails?: A Little Help needed moving to and from a bed to a chair (including a wheelchair)?: A Little Help needed standing up from a chair using your arms (e.g., wheelchair or bedside chair)?: A Little Help needed to walk in hospital room?: A Little Help needed climbing  3-5 steps with a railing? : A Little 6 Click Score: 18    End of Session Equipment Utilized During Treatment: Gait belt Activity Tolerance: Patient tolerated treatment well Patient left: in bed;with call bell/phone within reach;with bed alarm set Nurse Communication: Mobility status PT Visit Diagnosis: Unsteadiness on feet (R26.81);Muscle weakness (generalized) (M62.81);Other abnormalities of gait and mobility (R26.89);Difficulty in walking, not elsewhere classified (R26.2)    Time: [id number] PT Time Calculation (min) (ACUTE ONLY): 22 min   Charges:   PT Evaluation $PT Eval Moderate Complexity: 1 Mod         Kipp Brood, PT, DPT, [facility] Physical Therapist with [facility]  11/05/2018 4:52 PM
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
## 2018-11-05 NOTE — Progress Notes (Signed)
Patient ID: Mikayla [name], female   DOB: [birth year], 78 y.o.   MRN: [id number]    Progress Note   Subjective  Day # 4  CC; post ERCP pancreatitis  WBC up to 16.1, HGB 10.6  Patient says she had a rough night last night, she had some abdominal pain after clear liquids yesterday and increased abdominal pain after attempting dinner last night which lasted for several hours.  She did have to use an analgesic in order to rest.  She says she still feels weak.  She did take some small amounts of full liquids this morning with mild increase in discomfort, no nausea, no vomiting Also mentions that she was up every couple of hours last night urinating    Objective   Vital signs in last 24 hours: Temp:  [98.9 F (37.2 C)-99.4 F (37.4 C)] 99.4 F (37.4 C) (09/29 0449) Pulse Rate:  [62-90] 76 (09/29 0449) Resp:  [15-18] 16 (09/29 0449) BP: (139-174)/(50-76) 168/63 (09/29 0449) SpO2:  [92 %-96 %] 94 % (09/29 0449) Last BM Date: 11/04/18 General:   Elderly white female in NAD Heart:  irRegular rate and rhythm; no murmurs Lungs: Respirations even and unlabored, lungs CTA bilaterally Abdomen:  Soft, mild tenderness upper abdomen, no guarding ,and nondistended. Normal bowel sounds. Extremities:  Without edema. Neurologic:  Alert and oriented,  grossly normal neurologically. Psych:  Cooperative. Normal mood and affect.  Intake/Output from previous day: 09/28 0701 - 09/29 0700 In: 384.2 [P.O.:240; I.V.:144.2] Out: -  Intake/Output this shift: No intake/output data recorded.  Lab Results: Recent Labs    11/03/18 0603 11/04/18 0558 11/05/18 0519  WBC 12.6* 13.3* 16.1*  HGB 10.3* 10.7* 10.6*  HCT 33.6* 34.4* 33.2*  PLT 191 171 198   BMET Recent Labs    11/03/18 0603 11/04/18 0554  NA 139 140  K 4.0 3.8  CL 110 113*  CO2 22 15*  GLUCOSE 74 50*  BUN 16 17  CREATININE 0.46 0.52  CALCIUM 7.5* 7.8*   LFT Recent Labs    11/04/18 0554  PROT 5.4*  ALBUMIN 2.7*  AST 20  ALT 37   ALKPHOS 98  BILITOT 1.2   PT/INR No results for input(s): LABPROT, INR in the last 72 hours.  Studies/Results: No results found.     Assessment / Plan:    #19 78 year old female with acute post ERCP pancreatitis status post ERCP, sphincterotomy stone extraction and stent placement on 11/01/2018  Patient is stable, and parameters look good other than persistent leukocytosis. She is not ready to advance diet, or be discharged today  #2 atrial fibrillation-on chronic Coumadin-restarted yesterday #3 impaired mobility-we will try to get her ambulating a little with assistance  Plan; CBC and c-Met in a.m. Continue full liquids today not ready to advance Possible discharge tomorrow,      Principal Problem:   Acute pancreatitis after endoscopic retrograde cholangiopancreatography (ERCP) Active Problems:   Benign essential HTN   Paroxysmal atrial fibrillation (HCC)   Chronic diastolic HF (heart failure) ([facility])     LOS: 3 days   Mikayla [name] EsterwoodPA-C  11/05/2018, 9:00 AM

## 2018-11-05 NOTE — Progress Notes (Signed)
PROGRESS NOTE    Mikayla [name]  [id number] DOB: Sep 24, 1940 DOA: 11/02/2018 PCP: Mosie Lukes, MD     Brief Narrative:  Mikayla [name] is a 78 y.o. female with medical history significant of paroxysmal A fib, HTN, HLD, AS s/p TAVR who underwent ERCP with stent placement for choledocholithiasis on 9/25 with Dr. Rush Landmark. Now returns with abdominal pain, nausea, vomiting. She states that post procedure, she had a milkshake, and few hours later started to have epigastric abdominal pain that was a "band-like" pressure. Soon afterward, she started vomiting, about 5 times last night. She presented to [facility] for [facility]. Currently, after receiving pain medication and antiemetic, her symptoms have improved. She denies any fevers, chills, CP, worsening SOB from baseline, diarrhea, edema. Work up revealed lipase 5859, WBC 14.2, CT A/P with edema around pancreas, common bile duct stent appropriately positioned. GI consulted and patient transferred to [facility].   New events last 24 hours / Subjective: Last night, patient had increase in abdominal pain, went into A. fib RVR with heart rate in the 120-140s.  Currently, she had just received fentanyl prior to my examination and had no abdominal pain  Assessment & Plan:   Principal Problem:   Acute pancreatitis after endoscopic retrograde cholangiopancreatography (ERCP) Active Problems:   Benign essential HTN   Paroxysmal atrial fibrillation (HCC)   Chronic diastolic HF (heart failure) (HCC)   Acute pancreatitis post ERCP -GI following -Lipase 5859 --> 939 --> 48  -Remains on full liquid diet -IV fluid -Pain control, antiemetic   Essential HTN -Resume home Lopressor  Paroxysmal A Fib  -Resume Coumadin -Resume home Lopressor -Telemetry   Status post TAVR -Continue Coumadin  Chronic diastolic HF -Stable, echo 10/08/2018 with EF 60-65%   DVT prophylaxis: Coumadin Code Status: Full code Family Communication: None Disposition Plan: Pending  clinical improvement   Consultants:   GI  Procedures:   None  Antimicrobials:  Anti-infectives (From admission, onward)   None       Objective: Vitals:   11/05/18 0215 11/05/18 0449 11/05/18 1004 11/05/18 1039  BP: 139/76 (!) 168/63 (!) 157/93 (!) 144/65  Pulse: 90 76 (!) 117 66  Resp: 16 16  12   Temp: 99.2 F (37.3 C) 99.4 F (37.4 C)  98.8 F (37.1 C)  TempSrc: Oral Oral  Oral  SpO2: 92% 94%  95%  Weight:      Height:        Intake/Output Summary (Last 24 hours) at 11/05/2018 1235 Last data filed at 11/04/2018 1600 Gross per 24 hour  Intake 144.17 ml  Output -  Net 144.17 ml   Filed Weights   11/02/18 0342  Weight: 79.4 kg    Examination: General exam: Appears calm and comfortable  Respiratory system: Clear to auscultation. Respiratory effort normal. Cardiovascular system: S1 & S2 heard, irregular rhythm, tachycardic. No JVD, murmurs, rubs, gallops or clicks. No pedal edema. Gastrointestinal system: Abdomen is nondistended, soft and nontender (had just received fentanyl prior to my exam). No organomegaly or masses felt. Normal bowel sounds heard. Central nervous system: Alert and oriented. No focal neurological deficits. Extremities: Symmetric 5 x 5 power. Skin: No rashes, lesions or ulcers Psychiatry: Judgement and insight appear normal. Mood & affect appropriate.    Data Reviewed: I have personally reviewed following labs and imaging studies  CBC: Recent Labs  Lab 10/29/18 1442 11/02/18 0358 11/03/18 0603 11/04/18 0558 11/05/18 0519  WBC 7.7 14.2* 12.6* 13.3* 16.1*  NEUTROABS 5.2 12.4*  --   --   --  HGB 12.7 13.1 10.3* 10.7* 10.6*  HCT 38.9 41.9 33.6* 34.4* 33.2*  MCV 92.1 95.0 98.0 98.9 94.3  PLT 250.0 273 191 171 [id number]   Basic Metabolic Panel: Recent Labs  Lab 10/29/18 1442 11/02/18 0358 11/03/18 0603 11/04/18 0554  NA 141 139 139 140  K 4.2 3.9 4.0 3.8  CL 104 103 110 113*  CO2 31 25 22  15*  GLUCOSE 109* 135* 74 50*  BUN 13 18  16 17   CREATININE 0.86 0.76 0.46 0.52  CALCIUM 9.3 8.9 7.5* 7.8*  MG  --   --   --  2.2  PHOS  --   --   --  2.5   GFR: Estimated Creatinine Clearance: 58.4 mL/min (by C-G formula based on SCr of 0.52 mg/dL). Liver Function Tests: Recent Labs  Lab 10/29/18 1442 11/02/18 0358 11/03/18 0603 11/04/18 0554  AST 26 28 20 20   ALT 125* 64* 44 37  ALKPHOS 165* 131* 97 98  BILITOT 0.7 0.9 1.2 1.2  PROT 6.8 7.0 5.3* 5.4*  ALBUMIN 3.7 3.7 2.9* 2.7*   Recent Labs  Lab 11/02/18 0358 11/03/18 0603 11/04/18 0558  LIPASE 5,859* 939* 48   No results for input(s): AMMONIA in the last 168 hours. Coagulation Profile: Recent Labs  Lab 10/31/18 11/02/18 0358 11/05/18 1122  INR 1.8* 1.2 2.2*   Cardiac Enzymes: No results for input(s): CKTOTAL, CKMB, CKMBINDEX, TROPONINI in the last 168 hours. BNP (last 3 results) No results for input(s): PROBNP in the last 8760 hours. HbA1C: No results for input(s): HGBA1C in the last 72 hours. CBG: Recent Labs  Lab 11/04/18 1237 11/04/18 1317 11/04/18 1425 11/04/18 2129 11/05/18 0610  GLUCAP 39* 62* 115* 103* 88   Lipid Profile: No results for input(s): CHOL, HDL, LDLCALC, TRIG, CHOLHDL, LDLDIRECT in the last 72 hours. Thyroid Function Tests: No results for input(s): TSH, T4TOTAL, FREET4, T3FREE, THYROIDAB in the last 72 hours. Anemia Panel: No results for input(s): VITAMINB12, FOLATE, FERRITIN, TIBC, IRON, RETICCTPCT in the last 72 hours. Sepsis Labs: No results for input(s): PROCALCITON, LATICACIDVEN in the last 168 hours.  Recent Results (from the past 240 hour(s))  Novel Coronavirus, NAA (Hosp order, Send-out to Ref Lab; TAT 18-24 hrs     Status: None   Collection Time: 10/29/18 11:05 AM   Specimen: Nasopharyngeal Swab; Respiratory  Result Value Ref Range Status   SARS-CoV-2, NAA NOT DETECTED NOT DETECTED Final    Comment: (NOTE) This nucleic acid amplification test was developed and its performance characteristics determined by  [employer]. Nucleic acid amplification tests include PCR and TMA. This test has not been FDA cleared or approved. This test has been authorized by FDA under an Emergency Use Authorization (EUA). This test is only authorized for the duration of time the declaration that circumstances exist justifying the authorization of the emergency use of in vitro diagnostic tests for detection of SARS-CoV-2 virus and/or diagnosis of COVID-19 infection under section 564(b)(1) of the Act, 21 U.S.C. [id number]) (1), unless the authorization is terminated or revoked sooner. When diagnostic testing is negative, the possibility of a false negative result should be considered in the context of a patient's recent exposures and the presence of clinical signs and symptoms consistent with COVID-19. An individual without symptoms of COVID- 19 and who is not shedding SARS-CoV-2 vi rus would expect to have a negative (not detected) result in this assay. Performed At: [facility] [place], [place] [id number] Rush Farmer MD [id number]  Coronavirus Source NASOPHARYNGEAL  Final    Comment: Performed at [facility], [place] [address] [place], [place] [address]  SARS Coronavirus 2 [facility] order, Performed in [facility] hospital lab) Nasopharyngeal Nasopharyngeal Swab     Status: None   Collection Time: 11/02/18  3:58 AM   Specimen: Nasopharyngeal Swab  Result Value Ref Range Status   SARS Coronavirus 2 NEGATIVE NEGATIVE Final    Comment: (NOTE) If result is NEGATIVE SARS-CoV-2 target nucleic acids are NOT DETECTED. The SARS-CoV-2 RNA is generally detectable in upper and lower  respiratory specimens during the acute phase of infection. The lowest  concentration of SARS-CoV-2 viral copies this assay can detect is 250  copies / mL. A negative result does not preclude SARS-CoV-2 infection  and should not be used as the sole basis for treatment or other  patient  management decisions.  A negative result may occur with  improper specimen collection / handling, submission of specimen other  than nasopharyngeal swab, presence of viral mutation(s) within the  areas targeted by this assay, and inadequate number of viral copies  (<250 copies / mL). A negative result must be combined with clinical  observations, patient history, and epidemiological information. If result is POSITIVE SARS-CoV-2 target nucleic acids are DETECTED. The SARS-CoV-2 RNA is generally detectable in upper and lower  respiratory specimens dur ing the acute phase of infection.  Positive  results are indicative of active infection with SARS-CoV-2.  Clinical  correlation with patient history and other diagnostic information is  necessary to determine patient infection status.  Positive results do  not rule out bacterial infection or co-infection with other viruses. If result is PRESUMPTIVE POSTIVE SARS-CoV-2 nucleic acids MAY BE PRESENT.   A presumptive positive result was obtained on the submitted specimen  and confirmed on repeat testing.  While 2019 novel coronavirus  (SARS-CoV-2) nucleic acids may be present in the submitted sample  additional confirmatory testing may be necessary for epidemiological  and / or clinical management purposes  to differentiate between  SARS-CoV-2 and other Sarbecovirus currently known to infect humans.  If clinically indicated additional testing with an alternate test  methodology [phone]) is advised. The SARS-CoV-2 RNA is generally  detectable in upper and lower respiratory sp ecimens during the acute  phase of infection. The expected result is Negative. Fact Sheet for Patients:  StrictlyIdeas.no Fact Sheet for Healthcare Providers: BankingDealers.co.za This test is not yet approved or cleared by the [place] FDA and has been authorized for detection and/or diagnosis of SARS-CoV-2 by FDA under  an Emergency Use Authorization (EUA).  This EUA will remain in effect (meaning this test can be used) for the duration of the COVID-19 declaration under Section 564(b)(1) of the Act, 21 U.S.C. section 360bbb-3(b)(1), unless the authorization is terminated or revoked sooner. Performed at [facility], [address] [place], [place] [address]       Radiology Studies: No results found.    Scheduled Meds: . metoprolol tartrate  12.5 mg Oral BID  . sodium chloride flush  3 mL Intravenous Q12H  . Warfarin - Pharmacist Dosing Inpatient   Does not apply q1800   Continuous Infusions: . sodium chloride 50 mL/hr at 11/05/18 0106     LOS: 3 days      Time spent: 25 minutes   Dessa Phi, DO [facility] 11/05/2018, 12:35 PM   Available via Epic secure chat 7am-7pm After these hours, please refer to coverage provider listed on [email or web address]

## 2018-11-06 DIAGNOSIS — I480 Paroxysmal atrial fibrillation: Secondary | ICD-10-CM

## 2018-11-06 DIAGNOSIS — I5032 Chronic diastolic (congestive) heart failure: Secondary | ICD-10-CM

## 2018-11-06 DIAGNOSIS — E876 Hypokalemia: Secondary | ICD-10-CM

## 2018-11-06 LAB — COMPREHENSIVE METABOLIC PANEL
ALT: 23 U/L (ref 0–44)
AST: 15 U/L (ref 15–41)
Albumin: 2.6 g/dL — ABNORMAL LOW (ref 3.5–5.0)
Alkaline Phosphatase: 91 U/L (ref 38–126)
Anion gap: 9 (ref 5–15)
BUN: 6 mg/dL — ABNORMAL LOW (ref 8–23)
CO2: 21 mmol/L — ABNORMAL LOW (ref 22–32)
Calcium: 7.7 mg/dL — ABNORMAL LOW (ref 8.9–10.3)
Chloride: 111 mmol/L (ref 98–111)
Creatinine, Ser: 0.36 mg/dL — ABNORMAL LOW (ref 0.44–1.00)
GFR calc Af Amer: >60 mL/min (ref >60)
GFR calc non Af Amer: >60 mL/min (ref >60)
Glucose, Bld: 91 mg/dL (ref 70–99)
Potassium: 2.9 mmol/L — ABNORMAL LOW (ref 3.5–5.1)
Sodium: 141 mmol/L (ref 135–145)
Total Bilirubin: 1.2 mg/dL (ref 0.3–1.2)
Total Protein: 5.3 g/dL — ABNORMAL LOW (ref 6.5–8.1)

## 2018-11-06 LAB — CBC
HCT: 33.7 % — ABNORMAL LOW (ref 36.0–46.0)
Hemoglobin: 10.8 g/dL — ABNORMAL LOW (ref 12.0–15.0)
MCH: 30.2 pg (ref 26.0–34.0)
MCHC: 32 g/dL (ref 30.0–36.0)
MCV: 94.1 fL (ref 80.0–100.0)
Platelets: 223 10*3/uL (ref 150–400)
RBC: 3.58 MIL/uL — ABNORMAL LOW (ref 3.87–5.11)
RDW: 14.2 % (ref 11.5–15.5)
WBC: 11.8 10*3/uL — ABNORMAL HIGH (ref 4.0–10.5)
nRBC: 0 % (ref 0.0–0.2)

## 2018-11-06 LAB — PROTIME-INR
INR: 2.3 — ABNORMAL HIGH (ref 0.8–1.2)
Prothrombin Time: 24.8 seconds — ABNORMAL HIGH (ref 11.4–15.2)

## 2018-11-06 LAB — MAGNESIUM: Magnesium: 1.7 mg/dL (ref 1.7–2.4)

## 2018-11-06 LAB — SEDIMENTATION RATE: Sed Rate: 32 mm/hr — ABNORMAL HIGH (ref 0–22)

## 2018-11-06 LAB — C-REACTIVE PROTEIN: CRP: 10.4 mg/dL — ABNORMAL HIGH (ref <1.0)

## 2018-11-06 LAB — PHOSPHORUS: Phosphorus: 2.2 mg/dL — ABNORMAL LOW (ref 2.5–4.6)

## 2018-11-06 MED ORDER — WARFARIN SODIUM 1 MG PO TABS
1.0000 mg | ORAL_TABLET | Freq: Once | ORAL | Status: AC
  Administered 2018-11-06: 17:00:00 1 mg via ORAL
  Filled 2018-11-06: qty 1

## 2018-11-06 MED ORDER — MAGNESIUM SULFATE IN D5W 1-5 GM/100ML-% IV SOLN
1.0000 g | Freq: Once | INTRAVENOUS | Status: AC
  Administered 2018-11-06: 11:00:00 1 g via INTRAVENOUS
  Filled 2018-11-06: qty 100

## 2018-11-06 MED ORDER — POTASSIUM PHOSPHATES 15 MMOLE/5ML IV SOLN
15.0000 mmol | Freq: Once | INTRAVENOUS | Status: AC
  Administered 2018-11-06: 12:00:00 15 mmol via INTRAVENOUS
  Filled 2018-11-06: qty 5

## 2018-11-06 MED ORDER — POTASSIUM CHLORIDE 10 MEQ/100ML IV SOLN
10.0000 meq | INTRAVENOUS | Status: DC
  Administered 2018-11-06: 10 meq via INTRAVENOUS
  Filled 2018-11-06 (×3): qty 100

## 2018-11-06 MED ORDER — POTASSIUM CHLORIDE 10 MEQ/100ML IV SOLN
10.0000 meq | INTRAVENOUS | Status: AC
  Administered 2018-11-06 (×2): 10 meq via INTRAVENOUS

## 2018-11-06 MED ORDER — TRAMADOL HCL 50 MG PO TABS: 50.0000 mg | ORAL_TABLET | Freq: Four times a day (QID) | ORAL | Status: DC | PRN

## 2018-11-06 MED ORDER — POTASSIUM PHOSPHATES 15 MMOLE/5ML IV SOLN
15.0000 mmol | Freq: Once | INTRAVENOUS | Status: DC
  Filled 2018-11-06: qty 5
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
## 2018-11-06 NOTE — Progress Notes (Addendum)
Patient ID: Mikayla [name], female   DOB: 1940-07-25, 78 y.o.   MRN: [id number]    Progress Note   Subjective  Day # 5 CC: post ERCP pancreatitis  Feeling much better, up in chair, wants to try solid food, and hoping to go home, and hoping to go home CRP 10  Phos-2.2, MG 1.7, K 2.9 WBC down to 11.8 LFT's WNL INR 2.3- Coumadin    Objective   Vital signs in last 24 hours: Temp:  [97.9 F (36.6 C)-98.8 F (37.1 C)] 98.3 F (36.8 C) (09/30 0549) Pulse Rate:  [66-117] 87 (09/30 0549) Resp:  [12-19] 16 (09/30 0549) BP: (144-173)/(65-99) 154/99 (09/30 0549) SpO2:  [95 %-97 %] 95 % (09/30 0549) Last BM Date: 11/05/18 General:   Elderly white female in NAD Heart:  iregular rate and rhythm; no murmurs Lungs: Respirations even and unlabored, lungs CTA bilaterally Abdomen:  Soft, mildly tender and nondistended. Normal bowel sounds. Extremities:  Without edema. Neurologic:  Alert and oriented,  grossly normal neurologically. Psych:  Cooperative. Normal mood and affect.  Intake/Output from previous day: 09/29 0701 - 09/30 0700 In: 300 [P.O.:300] Out: -  Intake/Output this shift: No intake/output data recorded.  Lab Results: Recent Labs    11/04/18 0558 11/05/18 0519 11/06/18 0537  WBC 13.3* 16.1* 11.8*  HGB 10.7* 10.6* 10.8*  HCT 34.4* 33.2* 33.7*  PLT 171 198 223   BMET Recent Labs    11/04/18 0554 11/06/18 0537  NA 140 141  K 3.8 2.9*  CL 113* 111  CO2 15* 21*  GLUCOSE 50* 91  BUN 17 6*  CREATININE 0.52 0.36*  CALCIUM 7.8* 7.7*   LFT Recent Labs    11/06/18 0537  PROT 5.3*  ALBUMIN 2.6*  AST 15  ALT 23  ALKPHOS 91  BILITOT 1.2   PT/INR Recent Labs    11/05/18 1122 11/06/18 0537  LABPROT 24.2* 24.8*  INR 2.2* 2.3*        Assessment / Plan:    #35 78 year old white female with post ERCP pancreatitis, status post ERCP, sphincterotomy and stone extraction and stent placement 11/01/2018.  She is much better, advancing to low-fat diet  #2  electrolyte derangement with hypokalemia and hypophosphatemia-receiving replacement  #3 history of atrial fibrillation-Coumadin restarted  Plan; once potassium and phosphorus have been repleted, she is okay from GI standpoint to be discharged home. Low-fat diet Start Ultram 50 mg p.o. every 6 hours as needed pain-she has taken previously and has some at home. [place]  in about 3 weeks.  Patient knows to call in the interim for any problems.    LOS: 4 days   Amy EsterwoodPA-C  11/06/2018, 9:35 AM     [facility] Attending   I have taken an interval history, reviewed the chart and examined the patient. I agree with the Advanced Practitioner's note, impression and recommendations.   Improved and appears ready for dc.  Solid food not necessary to dc but she wants to see if she can tolerate.   F/U w/ GI as outpatient will be arranged  Does have a 10/26 w/ Dr. Lyndel Safe but we will work on something sooner.  Gatha Mayer, MD, [facility] 11/06/2018 10:53 AM Pager [phone]

## 2018-11-06 NOTE — Progress Notes (Signed)
[facility]  PROGRESS NOTE  Mikayla [name] [id number] DOB: [birth year] DOA: 11/02/2018 PCP: Mosie Lukes, MD   Brief HPI:   78 year old female with a history of paroxysmal atrial fibrillation, hypertension, hyperlipidemia aortic stenosis status post TAVR who underwent ERCP with stent placement for choledocholithiasis on 11/01/2018.  Return with abdominal pain, nausea vomiting.  Lipase elevated 5859.  WBC 14.2.  CT showed edema around pancreas, common bile duct stent appropriately positioned.  GI was consulted.    Subjective   Patient seen and examined, denies abdominal pain.  Tolerating low-fat diet.   Assessment/Plan:     1. Post ERCP pancreatitis-resolved, lipase was elevated 5859, down to 48.  Diet advanced to low-fat.  GI has signed off.  2. Hypokalemia-potassium is 2.9, will replace potassium.  Check BMP in a.m. 3.  4. Hypomagnesemia-magnesium 1.7 this morning, will give 1 dose of 1 g magnesium sulfate x1.  5. Hypophosphatemia-we will replace with K-Phos 15 mmol IV x1.  6. Hypertension-blood pressure stable continue Lopressor.  7. Paroxysmal atrial fibrillation-continue Coumadin, Lopressor.  8. Status post TAVR-stable.  9. Current diastolic CHF-euvolemic    CBG: Recent Labs  Lab 11/04/18 1317 11/04/18 1425 11/04/18 2129 11/05/18 0610 11/05/18 1813  GLUCAP 62* 115* 103* 88 114*    CBC: Recent Labs  Lab 11/02/18 0358 11/03/18 0603 11/04/18 0558 11/05/18 0519 11/06/18 0537  WBC 14.2* 12.6* 13.3* 16.1* 11.8*  NEUTROABS 12.4*  --   --   --   --   HGB 13.1 10.3* 10.7* 10.6* 10.8*  HCT 41.9 33.6* 34.4* 33.2* 33.7*  MCV 95.0 98.0 98.9 94.3 94.1  PLT 273 191 171 198 [id number]    Basic Metabolic Panel: Recent Labs  Lab 11/02/18 0358 11/03/18 0603 11/04/18 0554 11/06/18 0537  NA 139 139 140 141  K 3.9 4.0 3.8 2.9*  CL 103 110 113* 111  CO2 25 22 15* 21*  GLUCOSE 135* 74 50* 91  BUN 18 16 17  6*  CREATININE 0.76 0.46 0.52 0.36*  CALCIUM 8.9 7.5*  7.8* 7.7*  MG  --   --  2.2 1.7  PHOS  --   --  2.5 2.2*     DVT prophylaxis: Coumadin  Code Status: Full code  Family Communication: No family at bedside  Disposition Plan: likely home  In am    BMI  Estimated body mass index is 29.12 kg/m as calculated from the following:   Height as of this encounter: 5\' 5"  (1.651 m).   Weight as of this encounter: 79.4 kg.  Scheduled medications:  . gabapentin  800 mg Oral BID  . metoprolol tartrate  12.5 mg Oral BID  . sodium chloride flush  3 mL Intravenous Q12H  . warfarin  1 mg Oral ONCE-1800  . Warfarin - Pharmacist Dosing Inpatient   Does not apply q1800    Consultants:  GI   Antibiotics:   Anti-infectives (From admission, onward)   None      Objective   Vitals:   11/05/18 2104 11/06/18 0549 11/06/18 1300 11/06/18 1334  BP: (!) 173/66 (!) 154/99  133/69  Pulse: 70 87 67 63  Resp: 19 16  12   Temp: 97.9 F (36.6 C) 98.3 F (36.8 C)  98.5 F (36.9 C)  TempSrc: Oral Oral  Oral  SpO2: 97% 95%  95%  Weight:      Height:        Intake/Output Summary (Last 24 hours) at 11/06/2018 1530 Last data filed at 11/06/2018 1500 Gross per  24 hour  Intake 2588.87 ml  Output -  Net 2588.87 ml   Filed Weights   11/02/18 0342  Weight: 79.4 kg     Physical Examination:    General: Appears in no acute distress  Cardiovascular: S1-S2, regular, no murmur auscultated  Respiratory: Clear to auscultation bilaterally  Abdomen: Abdomen is soft, nontender, no organomegaly  Extremities: No edema in the lower extremities  Neurologic: No focal deficit noted     Data Reviewed: I have personally reviewed following labs and imaging studies   Recent Results (from the past 240 hour(s))  Novel Coronavirus, NAA (Hosp order, Send-out to Ref Lab; TAT 18-24 hrs     Status: None   Collection Time: 10/29/18 11:05 AM   Specimen: Nasopharyngeal Swab; Respiratory  Result Value Ref Range Status   SARS-CoV-2, NAA NOT DETECTED  NOT DETECTED Final    Comment: (NOTE) This nucleic acid amplification test was developed and its performance characteristics determined by [employer]. Nucleic acid amplification tests include PCR and TMA. This test has not been FDA cleared or approved. This test has been authorized by FDA under an Emergency Use Authorization (EUA). This test is only authorized for the duration of time the declaration that circumstances exist justifying the authorization of the emergency use of in vitro diagnostic tests for detection of SARS-CoV-2 virus and/or diagnosis of COVID-19 infection under section 564(b)(1) of the Act, 21 U.S.C. [id number]) (1), unless the authorization is terminated or revoked sooner. When diagnostic testing is negative, the possibility of a false negative result should be considered in the context of a patient's recent exposures and the presence of clinical signs and symptoms consistent with COVID-19. An individual without symptoms of COVID- 19 and who is not shedding SARS-CoV-2 vi rus would expect to have a negative (not detected) result in this assay. Performed At: [facility] [address] [place], [place] [id number] Rush Farmer MD [id number]    [place]  Final    Comment: Performed at [facility], [place] [address], [place], [place] [address]  SARS Coronavirus 2 [facility] order, Performed in [facility] hospital lab) Nasopharyngeal Nasopharyngeal Swab     Status: None   Collection Time: 11/02/18  3:58 AM   Specimen: Nasopharyngeal Swab  Result Value Ref Range Status   SARS Coronavirus 2 NEGATIVE NEGATIVE Final    Comment: (NOTE) If result is NEGATIVE SARS-CoV-2 target nucleic acids are NOT DETECTED. The SARS-CoV-2 RNA is generally detectable in upper and lower  respiratory specimens during the acute phase of infection. The lowest  concentration of SARS-CoV-2 viral copies this assay can detect is 250  copies / mL.  A negative result does not preclude SARS-CoV-2 infection  and should not be used as the sole basis for treatment or other  patient management decisions.  A negative result may occur with  improper specimen collection / handling, submission of specimen other  than nasopharyngeal swab, presence of viral mutation(s) within the  areas targeted by this assay, and inadequate number of viral copies  (<250 copies / mL). A negative result must be combined with clinical  observations, patient history, and epidemiological information. If result is POSITIVE SARS-CoV-2 target nucleic acids are DETECTED. The SARS-CoV-2 RNA is generally detectable in upper and lower  respiratory specimens dur ing the acute phase of infection.  Positive  results are indicative of active infection with SARS-CoV-2.  Clinical  correlation with patient history and other diagnostic information is  necessary to determine patient infection status.  Positive results do  not rule out bacterial infection or co-infection with other viruses. If result is PRESUMPTIVE POSTIVE SARS-CoV-2 nucleic acids MAY BE PRESENT.   A presumptive positive result was obtained on the submitted specimen  and confirmed on repeat testing.  While 2019 novel coronavirus  (SARS-CoV-2) nucleic acids may be present in the submitted sample  additional confirmatory testing may be necessary for epidemiological  and / or clinical management purposes  to differentiate between  SARS-CoV-2 and other Sarbecovirus currently known to infect humans.  If clinically indicated additional testing with an alternate test  methodology [phone]) is advised. The SARS-CoV-2 RNA is generally  detectable in upper and lower respiratory sp ecimens during the acute  phase of infection. The expected result is Negative. Fact Sheet for Patients:  StrictlyIdeas.no Fact Sheet for Healthcare Providers: BankingDealers.co.za This test is not  yet approved or cleared by the [place] FDA and has been authorized for detection and/or diagnosis of SARS-CoV-2 by FDA under an Emergency Use Authorization (EUA).  This EUA will remain in effect (meaning this test can be used) for the duration of the COVID-19 declaration under Section 564(b)(1) of the Act, 21 U.S.C. section 360bbb-3(b)(1), unless the authorization is terminated or revoked sooner. Performed at [facility], [address] [place], [place] [address]      Liver Function Tests: Recent Labs  Lab 11/02/18 [phone] 11/03/18 0603 11/04/18 0554 11/06/18 0537  AST 28 20 20 15   ALT 64* 44 37 23  ALKPHOS 131* 97 98 91  BILITOT 0.9 1.2 1.2 1.2  PROT 7.0 5.3* 5.4* 5.3*  ALBUMIN 3.7 2.9* 2.7* 2.6*   Recent Labs  Lab 11/02/18 0358 11/03/18 0603 11/04/18 0558  LIPASE 5,859* 939* 48   No results for input(s): AMMONIA in the last 168 hours.  Cardiac Enzymes: No results for input(s): CKTOTAL, CKMB, CKMBINDEX, TROPONINI in the last 168 hours. BNP (last 3 results) Recent Labs    11/09/17 1519  BNP 216.7*      Admission status: Inpatient: Based on patients clinical presentation and evaluation of above clinical data, I have made determination that patient meets Inpatient criteria at this time.  Time spent: 20 min  [facility] Pager [phone]. If 7PM-7AM, please contact night-coverage at [email or web address], Office  [phone]  password [email or web address]  11/06/2018, 3:30 PM  LOS: 4 days

## 2018-11-07 DIAGNOSIS — I10 Essential (primary) hypertension: Secondary | ICD-10-CM

## 2018-11-07 LAB — PHOSPHORUS: Phosphorus: 4 mg/dL (ref 2.5–4.6)

## 2018-11-07 LAB — BASIC METABOLIC PANEL
Anion gap: 8 (ref 5–15)
BUN: 10 mg/dL (ref 8–23)
CO2: 23 mmol/L (ref 22–32)
Calcium: 8 mg/dL — ABNORMAL LOW (ref 8.9–10.3)
Chloride: 110 mmol/L (ref 98–111)
Creatinine, Ser: 0.61 mg/dL (ref 0.44–1.00)
GFR calc Af Amer: >60 mL/min (ref >60)
GFR calc non Af Amer: >60 mL/min (ref >60)
Glucose, Bld: 111 mg/dL — ABNORMAL HIGH (ref 70–99)
Potassium: 3.6 mmol/L (ref 3.5–5.1)
Sodium: 141 mmol/L (ref 135–145)

## 2018-11-07 LAB — PROTIME-INR
INR: 1.9 — ABNORMAL HIGH (ref 0.8–1.2)
Prothrombin Time: 21.2 seconds — ABNORMAL HIGH (ref 11.4–15.2)

## 2018-11-07 MED ORDER — WARFARIN SODIUM 4 MG PO TABS: 4.0000 mg | ORAL_TABLET | Freq: Once | ORAL | Status: DC

## 2018-11-07 MED ORDER — TRAMADOL HCL 50 MG PO TABS: 50.0000 mg | ORAL_TABLET | Freq: Four times a day (QID) | ORAL | 0 refills | Status: DC | PRN

## 2018-11-07 NOTE — Progress Notes (Signed)
Physical Therapy Treatment Patient Details Name: Mikayla [name] MRN: [id number] DOB: [birth year] Today's Date: 11/07/2018    History of Present Illness Patient is a 78 y.o. female with medical history significant of paroxysmal A fib, HTN, HLD, AS s/p TAVR who underwent ERCP with stent placement for choledocholithiasis on 9/25 with Dr. Rush Landmark. She returned on 11/02/18 with abdominal pain, nausea, vomiting. Pt has been admitted with primary diagnosis of Acute pancreatitis after ERCP.    PT Comments    Patient progressing well with mobility and no longer requires assist for bed mobility. She demonstrated improved steadiness with transfers and gait using cane. Patient instructed on exercise for LE's in supine and seated position to perform at home and she demonstrated good understanding of form for each. Pt is currently safe to discharge home when medically ready.    Follow Up Recommendations  Home health PT;Supervision for mobility/OOB     Equipment Recommendations  None recommended by PT    Recommendations for Other Services       Precautions / Restrictions      Mobility  Bed Mobility Overal bed mobility: Needs Assistance Bed Mobility: Supine to Sit     Supine to sit: HOB elevated;Modified independent (Device/Increase time) Sit to supine: Modified independent (Device/Increase time)   General bed mobility comments: pt with improved bed mobility sequecning and no longer limited by pain is able to complete without assistance  Transfers Overall transfer level: Needs assistance Equipment used: Quad cane Transfers: Sit to/from Stand Sit to Stand: Supervision;From elevated surface         General transfer comment: pt with good safety awareness and technique for sit<>stand from EOB slightly elevated  Ambulation/Gait Ambulation/Gait assistance: Min guard Gait Distance (Feet): 100 Feet Assistive device: Quad cane Gait Pattern/deviations: Step-through pattern;Decreased stride  length Gait velocity: decreased and labored   General Gait Details: pt using cane in Rt hand with good sequencing to keep with Lt stepping. No standing rest breaks required this session and pt able to ambulate greater distance. no overt LOB noted   Stairs             Wheelchair Mobility    Modified Rankin (Stroke Patients Only)       Balance Overall balance assessment: Needs assistance Sitting-balance support: No upper extremity supported;Feet supported Sitting balance-Leahy Scale: Good     Standing balance support: Single extremity supported;During functional activity Standing balance-Leahy Scale: Good             Cognition Arousal/Alertness: Awake/alert Behavior During Therapy: WFL for tasks assessed/performed Overall Cognitive Status: Within Functional Limits for tasks assessed             Exercises General Exercises - Lower Extremity Quad Sets: AROM;Both;5 reps;Supine Long Arc Quad: AROM;Both;5 reps;Seated Hip ABduction/ADduction: AROM;Both;5 reps;Supine Heel Raises: AROM;Both;5 reps;Supine Other Exercises Other Exercises: Sit<>Stands: 5 reps, from elevated EOB, pt using bil UE to initaite power up, no unsteadiness noted    General Comments        Pertinent Vitals/Pain Pain Assessment: No/denies pain           PT Goals (current goals can now be found in the care plan section) Acute Rehab PT Goals Patient Stated Goal: to return home with improved strength and independence PT Goal Formulation: With patient Time For Goal Achievement: 11/19/18 Potential to Achieve Goals: Good Progress towards PT goals: Progressing toward goals    Frequency    Min 3X/week      PT Plan Current plan remains appropriate  AM-PAC PT "6 Clicks" Mobility   Outcome Measure  Help needed turning from your back to your side while in a flat bed without using bedrails?: None Help needed moving from lying on your back to sitting on the side of a flat bed  without using bedrails?: None Help needed moving to and from a bed to a chair (including a wheelchair)?: A Little Help needed standing up from a chair using your arms (e.g., wheelchair or bedside chair)?: A Little Help needed to walk in hospital room?: A Little Help needed climbing 3-5 steps with a railing? : A Little 6 Click Score: 20    End of Session Equipment Utilized During Treatment: Gait belt Activity Tolerance: Patient tolerated treatment well Patient left: with call bell/phone within reach;with bed alarm set;in bed Nurse Communication: Mobility status PT Visit Diagnosis: Unsteadiness on feet (R26.81);Muscle weakness (generalized) (M62.81);Other abnormalities of gait and mobility (R26.89);Difficulty in walking, not elsewhere classified (R26.2)     Time: [id number] PT Time Calculation (min) (ACUTE ONLY): 16 min  Charges:  $Therapeutic Exercise: 8-22 mins                    Kipp Brood, PT, DPT, [facility] Physical Therapist with [facility]  11/07/2018 1:31 PM

## 2018-11-07 NOTE — Progress Notes (Signed)
Pt given discharge instructions and all questions answered. Pt will discharge home with daughter.

## 2018-11-07 NOTE — Care Management Important Message (Signed)
Important Message  Patient Details IM Letter given to Sharren Bridge SW to present to the Patient Name: Mikayla [name] MRN: [id number] Date of Birth: 13-Aug-1940   Medicare Important Message Given:  Yes     Kerin Salen 11/07/2018, 11:59 AM

## 2018-11-07 NOTE — Discharge Summary (Signed)
Physician Discharge Summary  Mikayla [name] [id number] DOB: 04-11-1940 DOA: 11/02/2018  PCP: Mosie Lukes, MD  Admit date: 11/02/2018 Discharge date: 11/07/2018  Time spent: 40 minutes  Recommendations for Outpatient Follow-up:  1. Follow-up PCP in 2 weeks 2. Follow-up GI as outpatient in 2 to 3 weeks.  GI working on getting an appointment sooner.   Discharge Diagnoses:  Principal Problem:   Acute pancreatitis after endoscopic retrograde cholangiopancreatography (ERCP) Active Problems:   Benign essential HTN   Paroxysmal atrial fibrillation (HCC)   Chronic diastolic HF (heart failure) ([place])   Discharge Condition: Stable  Diet recommendation: Low-fat diet  Filed Weights   11/02/18 0342  Weight: 79.4 kg    History of present illness:  78 year old female with a history of paroxysmal atrial fibrillation, hypertension, hyperlipidemia aortic stenosis status post TAVR who underwent ERCP with stent placement for choledocholithiasis on 11/01/2018.  Return with abdominal pain, nausea vomiting.  Lipase elevated 5859.  WBC 14.2.  CT showed edema around pancreas, common bile duct stent appropriately positioned.  GI was consulted.   Hospital Course:   1. Post ERCP pancreatitis-resolved, lipase was elevated 5859, down to 48.  Diet advanced to low-fat.  GI has signed off.  2. Hypokalemia-replete.  Today potassium is 3.6 3.  4. Hypomagnesemia-magnesium 1.7 yesterday.  Received 1 g of IV mag sulfate .  5. Hypophosphatemia-replete  6. Hypertension-blood pressure stable continue Lopressor.  7. Paroxysmal atrial fibrillation-continue Coumadin, Lopressor.  8. Status post TAVR-stable.  9. Current diastolic CHF-euvolemic  Procedures:  None  Consultations:  GI  Discharge Exam: Vitals:   11/07/18 0439 11/07/18 0904  BP: (!) 145/92 140/87  Pulse: 98 95  Resp: 16   Temp: 98.7 F (37.1 C)   SpO2: 95%     General: Appears in no acute distress Cardiovascular: S1-S2,  regular Respiratory: Clear to auscultation bilaterally  Discharge Instructions   Discharge Instructions    Diet - low sodium heart healthy   Complete by: As directed    Increase activity slowly   Complete by: As directed      Allergies as of 11/07/2018      Reactions   Indomethacin Nausea And Vomiting, Other (See Comments)   Caused chest pain.   Codeine Nausea And Vomiting      Medication List    TAKE these medications   acetaminophen 500 MG tablet Commonly known as: TYLENOL Take 500 mg by mouth every 8 (eight) hours as needed for mild pain or headache.   BENEFIBER PO Take 1 Dose by mouth daily.   beta carotene w/minerals tablet Take 1 tablet by mouth daily with supper.   CAL MAG ZINC +D3 PO Take 2 tablets by mouth daily.   Culturelle Digestive Health Caps Take 1 capsule by mouth every 3 (three) days.   Fish Oil 1200 MG Caps Take 1,200 mg by mouth daily. W/ Omega-3 360 mg   gabapentin 800 MG tablet Commonly known as: NEURONTIN TAKE 1 TABLET BY MOUTH 2 TIMES DAILY   metoprolol tartrate 25 MG tablet Commonly known as: LOPRESSOR Take 0.5 tablets (12.5 mg total) by mouth 2 (two) times daily.   multivitamin with minerals Tabs tablet Take 1 tablet by mouth daily with supper.   omeprazole 40 MG capsule Commonly known as: PRILOSEC TAKE ONE CAPSULE BY MOUTH ONCE DAILY What changed: when to take this   SYSTANE OP Place 1 drop into both eyes 2 (two) times daily as needed (for dry eyes).   traMADol 50 MG tablet Commonly  known as: ULTRAM Take 1 tablet (50 mg total) by mouth every 6 (six) hours as needed for moderate pain. What changed: See the new instructions.   Vitamin D3 25 MCG (1000 UT) Caps Take 1,000 Units by mouth daily.   warfarin 5 MG tablet Commonly known as: COUMADIN Take as directed. If you are unsure how to take this medication, talk to your nurse or doctor. Original instructions: TAKE 1/2 TO 1 TABLET DAILY AS DIRECTED BY COUMADIN CLINIC What  changed:   how much to take  how to take this  when to take this  additional instructions      Allergies  Allergen Reactions  . Indomethacin Nausea And Vomiting and Other (See Comments)    Caused chest pain.  . Codeine Nausea And Vomiting   Follow-up Information    Mosie Lukes, MD Follow up in 2 week(s).   Specialty: Family Medicine Contact information: [place] [address] [phone]        Minus Breeding, MD .   Specialty: Cardiology Contact information: [address] [place] [address] [phone]            The results of significant diagnostics from this hospitalization (including imaging, microbiology, ancillary and laboratory) are listed below for reference.    Significant Diagnostic Studies: Ct Abdomen Pelvis W Contrast  Result Date: 11/02/2018 CLINICAL DATA:  Abdominal pain.  ERCP yesterday. EXAM: CT ABDOMEN AND PELVIS WITH CONTRAST TECHNIQUE: Multidetector CT imaging of the abdomen and pelvis was performed using the standard protocol following bolus administration of intravenous contrast. CONTRAST:  74mL OMNIPAQUE IOHEXOL 300 MG/ML  SOLN COMPARISON:  MRI abdomen 10/25/2018 FINDINGS: Lower chest: Status post AVR. Coronary artery calcification is evident. Hepatobiliary: Multiple hepatic cysts are noted. Intra hepatic biliary duct dilatation is similar to recent MRI with pneumobilia on today's exam compatible with the presence of a common bile duct stent. Gallbladder is surgically absent. Pancreas: Pancreatic parenchyma is diffusely edematous with diffuse peripancreatic edema evident. Main pancreatic duct measures up to 4-5 mm diameter. Pancreatic parenchyma enhances throughout. Spleen: No splenomegaly. No focal mass lesion. Adrenals/Urinary Tract: No adrenal nodule or mass. 2.9 cm simple cyst identified interpolar right kidney. Tiny nonobstructing stone identified lower pole left kidney (coronal 41/5). No  evidence for hydroureter. The urinary bladder appears normal for the degree of distention. Stomach/Bowel: Stomach is unremarkable. No gastric wall thickening. No evidence of outlet obstruction. Duodenum is normally positioned as is the ligament of Treitz. No small bowel wall thickening. No small bowel dilatation. The terminal ileum is normal. The appendix is not visualized, but there is no edema or inflammation in the region of the cecum. No gross colonic mass. No colonic wall thickening. Diverticuli are seen scattered along the entire length of the colon without CT findings of diverticulitis. Vascular/Lymphatic: There is abdominal aortic atherosclerosis without aneurysm. Celiac axis and SMA have normal imaging features. Portal vein, superior mesenteric vein, and splenic vein are patent. There is no gastrohepatic or hepatoduodenal ligament lymphadenopathy. No intraperitoneal or retroperitoneal lymphadenopathy. No pelvic sidewall lymphadenopathy. Reproductive: The uterus is surgically absent. There is no adnexal mass. Other: No intraperitoneal free fluid. Edema is identified in the transverse mesocolon compatible with the pancreatitis. Musculoskeletal: Status post left hip replacement. No worrisome lytic or sclerotic osseous abnormality. IMPRESSION: 1. Edema identified in and diffusely around the pancreas consistent with acute pancreatitis. Pancreatic parenchyma shows enhancement throughout with no features to raise concern for pancreatic necrosis at this time.  No evidence for vascular complication. 2. Common bile duct stent appears appropriately positioned with residual intra and extrahepatic biliary duct dilatation. Pneumobilia on today's study is compatible with the presence of the stent. 3. Right renal cysts with tiny nonobstructing left renal stone. 4.  Aortic Atherosclerois (ICD10-170.0) Electronically Signed   By: Misty Stanley M.D.   On: 11/02/2018 06:36   Mr 3d Recon At Scanner  Result Date:  10/25/2018 CLINICAL DATA:  Abdominal pain with elevated liver function studies and leukocytosis. Cholelithiasis. EXAM: MRI ABDOMEN WITHOUT AND WITH CONTRAST (INCLUDING MRCP) TECHNIQUE: Multiplanar multisequence MR imaging of the abdomen was performed both before and after the administration of intravenous contrast. Heavily T2-weighted images of the biliary and pancreatic ducts were obtained, and three-dimensional MRCP images were rendered by post processing. CONTRAST:  68mL GADAVIST GADOBUTROL 1 MMOL/ML IV SOLN COMPARISON:  Abdominal CTA 10/22/2017. FINDINGS: Lower chest: Patient is status post TAVR. The visualized chest otherwise appears unremarkable. Hepatobiliary: There are stable cysts within the right hepatic lobe, measuring up to 2.1 cm in the dome of the right lobe. No enhancing hepatic lesions are demonstrated. The patient is status post cholecystectomy. There is chronic intra and extrahepatic biliary dilatation with dilatation of the cystic duct remnant. The common hepatic duct measures up to 2.1 cm in diameter, slightly improved from previous CT. There are calculi in the distal common bile duct, best seen on coronal images 8 and 9 of series 11. No biliary ductal wall thickening or abnormal enhancement identified to suggest cholangitis. Pancreas: Unremarkable. No pancreatic ductal dilatation or surrounding inflammatory changes. Spleen: Normal in size without focal abnormality. Adrenals/Urinary Tract: Both adrenal glands appear normal. Stable cyst posteriorly in the mid right kidney. No evidence of renal mass or hydronephrosis. Stomach/Bowel: No evidence of bowel wall thickening, distention or surrounding inflammatory change. Vascular/Lymphatic: There are no enlarged abdominal lymph nodes. Aortic and branch vessel atherosclerosis and tortuosity, better seen on CT. Other: No ascites. The visualized anterior abdominal wall appears intact. Musculoskeletal: Convex left thoracolumbar scoliosis with associated  spondylosis. No acute osseous findings. IMPRESSION: 1. Choledocholithiasis with chronic intra and extrahepatic biliary dilatation, mildly improved from previous CTA 10/22/2017. 2. No biliary ductal wall thickening or abnormal enhancement identified to suggest cholangitis. 3. The pancreas appears normal, without inflammation or ductal dilatation. 4. Stable hepatic and renal cysts, lumbar spondylosis and Aortic Atherosclerosis (ICD10-I70.0). Electronically Signed   By: Richardean Sale M.D.   On: 10/25/2018 15:33   Dg Ercp Biliary & Pancreatic Ducts  Result Date: 11/01/2018 CLINICAL DATA:  78 year old female with a history of choledocholithiasis EXAM: ERCP TECHNIQUE: Multiple spot images obtained with the fluoroscopic device and submitted for interpretation post-procedure. FLUOROSCOPY TIME:  Fluoroscopy Time:  4 minutes 50 seconds COMPARISON:  MR October 25, 2018 FINDINGS: Limited intraoperative fluoroscopic spot images during ERCP. Initial image demonstrates endoscope projecting over the upper abdomen with surgical changes of cholecystectomy. There is then cannulation of the ampulla with partial opacification of the extrahepatic biliary system. Balloon angioplasty performed, with placement of a plastic biliary stent. IMPRESSION: Limited images during ERCP demonstrates angioplasty of the ampulla with placement of a plastic biliary stent, for treatment of choledocholithiasis. Please refer to the dictated operative report for full details of intraoperative findings and procedure. Electronically Signed   By: Corrie Mckusick D.O.   On: 11/01/2018 10:14   Mr Abdomen Mrcp Moise Boring Contast  Result Date: 10/25/2018 CLINICAL DATA:  Abdominal pain with elevated liver function studies and leukocytosis. Cholelithiasis. EXAM: MRI ABDOMEN WITHOUT AND WITH  CONTRAST (INCLUDING MRCP) TECHNIQUE: Multiplanar multisequence MR imaging of the abdomen was performed both before and after the administration of intravenous contrast. Heavily  T2-weighted images of the biliary and pancreatic ducts were obtained, and three-dimensional MRCP images were rendered by post processing. CONTRAST:  54mL GADAVIST GADOBUTROL 1 MMOL/ML IV SOLN COMPARISON:  Abdominal CTA 10/22/2017. FINDINGS: Lower chest: Patient is status post TAVR. The visualized chest otherwise appears unremarkable. Hepatobiliary: There are stable cysts within the right hepatic lobe, measuring up to 2.1 cm in the dome of the right lobe. No enhancing hepatic lesions are demonstrated. The patient is status post cholecystectomy. There is chronic intra and extrahepatic biliary dilatation with dilatation of the cystic duct remnant. The common hepatic duct measures up to 2.1 cm in diameter, slightly improved from previous CT. There are calculi in the distal common bile duct, best seen on coronal images 8 and 9 of series 11. No biliary ductal wall thickening or abnormal enhancement identified to suggest cholangitis. Pancreas: Unremarkable. No pancreatic ductal dilatation or surrounding inflammatory changes. Spleen: Normal in size without focal abnormality. Adrenals/Urinary Tract: Both adrenal glands appear normal. Stable cyst posteriorly in the mid right kidney. No evidence of renal mass or hydronephrosis. Stomach/Bowel: No evidence of bowel wall thickening, distention or surrounding inflammatory change. Vascular/Lymphatic: There are no enlarged abdominal lymph nodes. Aortic and branch vessel atherosclerosis and tortuosity, better seen on CT. Other: No ascites. The visualized anterior abdominal wall appears intact. Musculoskeletal: Convex left thoracolumbar scoliosis with associated spondylosis. No acute osseous findings. IMPRESSION: 1. Choledocholithiasis with chronic intra and extrahepatic biliary dilatation, mildly improved from previous CTA 10/22/2017. 2. No biliary ductal wall thickening or abnormal enhancement identified to suggest cholangitis. 3. The pancreas appears normal, without inflammation or  ductal dilatation. 4. Stable hepatic and renal cysts, lumbar spondylosis and Aortic Atherosclerosis (ICD10-I70.0). Electronically Signed   By: Richardean Sale M.D.   On: 10/25/2018 15:33    Microbiology: Recent Results (from the past 240 hour(s))  Novel Coronavirus, NAA (Hosp order, Send-out to Ref Lab; TAT 18-24 hrs     Status: None   Collection Time: 10/29/18 11:05 AM   Specimen: Nasopharyngeal Swab; Respiratory  Result Value Ref Range Status   SARS-CoV-2, NAA NOT DETECTED NOT DETECTED Final    Comment: (NOTE) This nucleic acid amplification test was developed and its performance characteristics determined by [employer]. Nucleic acid amplification tests include PCR and TMA. This test has not been FDA cleared or approved. This test has been authorized by FDA under an Emergency Use Authorization (EUA). This test is only authorized for the duration of time the declaration that circumstances exist justifying the authorization of the emergency use of in vitro diagnostic tests for detection of SARS-CoV-2 virus and/or diagnosis of COVID-19 infection under section 564(b)(1) of the Act, 21 U.S.C. [id number]) (1), unless the authorization is terminated or revoked sooner. When diagnostic testing is negative, the possibility of a false negative result should be considered in the context of a patient's recent exposures and the presence of clinical signs and symptoms consistent with COVID-19. An individual without symptoms of COVID- 19 and who is not shedding SARS-CoV-2 vi rus would expect to have a negative (not detected) result in this assay. Performed At: [facility] [address] [place], [place] [id number] Rush Farmer MD [id number]    [place]  Final    Comment: Performed at [facility], [employer] [address] [place], [place] [address]  SARS Coronavirus 2 [facility] order, Performed in [facility]  hospital lab) Nasopharyngeal  Nasopharyngeal Swab     Status: None   Collection Time: 11/02/18  3:58 AM   Specimen: Nasopharyngeal Swab  Result Value Ref Range Status   SARS Coronavirus 2 NEGATIVE NEGATIVE Final    Comment: (NOTE) If result is NEGATIVE SARS-CoV-2 target nucleic acids are NOT DETECTED. The SARS-CoV-2 RNA is generally detectable in upper and lower  respiratory specimens during the acute phase of infection. The lowest  concentration of SARS-CoV-2 viral copies this assay can detect is 250  copies / mL. A negative result does not preclude SARS-CoV-2 infection  and should not be used as the sole basis for treatment or other  patient management decisions.  A negative result may occur with  improper specimen collection / handling, submission of specimen other  than nasopharyngeal swab, presence of viral mutation(s) within the  areas targeted by this assay, and inadequate number of viral copies  (<250 copies / mL). A negative result must be combined with clinical  observations, patient history, and epidemiological information. If result is POSITIVE SARS-CoV-2 target nucleic acids are DETECTED. The SARS-CoV-2 RNA is generally detectable in upper and lower  respiratory specimens dur ing the acute phase of infection.  Positive  results are indicative of active infection with SARS-CoV-2.  Clinical  correlation with patient history and other diagnostic information is  necessary to determine patient infection status.  Positive results do  not rule out bacterial infection or co-infection with other viruses. If result is PRESUMPTIVE POSTIVE SARS-CoV-2 nucleic acids MAY BE PRESENT.   A presumptive positive result was obtained on the submitted specimen  and confirmed on repeat testing.  While 2019 novel coronavirus  (SARS-CoV-2) nucleic acids may be present in the submitted sample  additional confirmatory testing may be necessary for epidemiological  and / or clinical management purposes  to differentiate between   SARS-CoV-2 and other Sarbecovirus currently known to infect humans.  If clinically indicated additional testing with an alternate test  methodology [phone]) is advised. The SARS-CoV-2 RNA is generally  detectable in upper and lower respiratory sp ecimens during the acute  phase of infection. The expected result is Negative. Fact Sheet for Patients:  StrictlyIdeas.no Fact Sheet for Healthcare Providers: BankingDealers.co.za This test is not yet approved or cleared by the [place] FDA and has been authorized for detection and/or diagnosis of SARS-CoV-2 by FDA under an Emergency Use Authorization (EUA).  This EUA will remain in effect (meaning this test can be used) for the duration of the COVID-19 declaration under Section 564(b)(1) of the Act, 21 U.S.C. section 360bbb-3(b)(1), unless the authorization is terminated or revoked sooner. Performed at [facility], [address] [place], [place] [address]      Labs: Basic Metabolic Panel: Recent Labs  Lab 11/02/18 0358 11/03/18 0603 11/04/18 0554 11/06/18 0537 11/07/18 0542  NA 139 139 140 141 141  K 3.9 4.0 3.8 2.9* 3.6  CL 103 110 113* 111 110  CO2 25 22 15* 21* 23  GLUCOSE 135* 74 50* 91 111*  BUN 18 16 17  6* 10  CREATININE 0.76 0.46 0.52 0.36* 0.61  CALCIUM 8.9 7.5* 7.8* 7.7* 8.0*  MG  --   --  2.2 1.7  --   PHOS  --   --  2.5 2.2* 4.0   Liver Function Tests: Recent Labs  Lab 11/02/18 0358 11/03/18 0603 11/04/18 0554 11/06/18 0537  AST 28 20 20 15   ALT 64* 44 37 23  ALKPHOS 131* 97  98 91  BILITOT 0.9 1.2 1.2 1.2  PROT 7.0 5.3* 5.4* 5.3*  ALBUMIN 3.7 2.9* 2.7* 2.6*   Recent Labs  Lab 11/02/18 0358 11/03/18 0603 11/04/18 0558  LIPASE 5,859* 939* 48   No results for input(s): AMMONIA in the last 168 hours. CBC: Recent Labs  Lab 11/02/18 0358 11/03/18 0603 11/04/18 0558 11/05/18 0519 11/06/18 0537  WBC 14.2* 12.6* 13.3* 16.1* 11.8*   NEUTROABS 12.4*  --   --   --   --   HGB 13.1 10.3* 10.7* 10.6* 10.8*  HCT 41.9 33.6* 34.4* 33.2* 33.7*  MCV 95.0 98.0 98.9 94.3 94.1  PLT 273 191 171 198 223   Cardiac Enzymes: No results for input(s): CKTOTAL, CKMB, CKMBINDEX, TROPONINI in the last 168 hours. BNP: BNP (last 3 results) Recent Labs    11/09/17 1519  BNP 216.7*    ProBNP (last 3 results) No results for input(s): PROBNP in the last 8760 hours.  CBG: Recent Labs  Lab 11/04/18 1317 11/04/18 1425 11/04/18 2129 11/05/18 0610 11/05/18 1813  GLUCAP 62* 115* 103* 88 114*       Signed:  Oswald Hillock MD.  [facility] 11/07/2018, 9:51 AM
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
## 2018-11-08 ENCOUNTER — Telehealth: Payer: Self-pay | Admitting: *Deleted

## 2018-11-08 ENCOUNTER — Ambulatory Visit (INDEPENDENT_AMBULATORY_CARE_PROVIDER_SITE_OTHER): Payer: Medicare Other | Admitting: Pharmacist

## 2018-11-08 ENCOUNTER — Telehealth: Payer: Self-pay

## 2018-11-08 DIAGNOSIS — Z7901 Long term (current) use of anticoagulants: Secondary | ICD-10-CM | POA: Diagnosis not present

## 2018-11-08 DIAGNOSIS — I480 Paroxysmal atrial fibrillation: Secondary | ICD-10-CM | POA: Diagnosis not present

## 2018-11-08 DIAGNOSIS — Z86718 Personal history of other venous thrombosis and embolism: Secondary | ICD-10-CM

## 2018-11-08 LAB — POCT INR: INR: 1.3 — AB (ref 2.0–3.0)

## 2018-11-08 NOTE — Telephone Encounter (Signed)
Staff message to contact pt in about 6 weeks to set up ERCP

## 2018-11-08 NOTE — Telephone Encounter (Signed)
-----   Message from Timothy Lasso, RN sent at 11/05/2018  9:25 AM EDT ----- Mansouraty, Telford Nab., MD  Timothy Lasso, RN    Please send letter when able. Patient remains hospitalized with post ERCP pancreatitis. He is reached out at the end of this week or early next week to talk with daughter and get on the books for follow-up ERCP in 6 to 8 weeks. Thank you.

## 2018-11-08 NOTE — Telephone Encounter (Signed)
Pt's daughter answered stating that pt is doing well, but currently sleeping. Reports she will have pt call next week to schedule hospital follow up appt.

## 2018-11-11 ENCOUNTER — Telehealth: Payer: Self-pay | Admitting: *Deleted

## 2018-11-11 ENCOUNTER — Ambulatory Visit (INDEPENDENT_AMBULATORY_CARE_PROVIDER_SITE_OTHER): Payer: Medicare Other | Admitting: Cardiovascular Disease

## 2018-11-11 DIAGNOSIS — I480 Paroxysmal atrial fibrillation: Secondary | ICD-10-CM | POA: Diagnosis not present

## 2018-11-11 DIAGNOSIS — R109 Unspecified abdominal pain: Secondary | ICD-10-CM

## 2018-11-11 DIAGNOSIS — R945 Abnormal results of liver function studies: Secondary | ICD-10-CM

## 2018-11-11 DIAGNOSIS — Z7901 Long term (current) use of anticoagulants: Secondary | ICD-10-CM

## 2018-11-11 DIAGNOSIS — K861 Other chronic pancreatitis: Secondary | ICD-10-CM

## 2018-11-11 DIAGNOSIS — K858 Other acute pancreatitis without necrosis or infection: Secondary | ICD-10-CM

## 2018-11-11 DIAGNOSIS — K8050 Calculus of bile duct without cholangitis or cholecystitis without obstruction: Secondary | ICD-10-CM

## 2018-11-11 DIAGNOSIS — Z86718 Personal history of other venous thrombosis and embolism: Secondary | ICD-10-CM | POA: Diagnosis not present

## 2018-11-11 DIAGNOSIS — R7982 Elevated C-reactive protein (CRP): Secondary | ICD-10-CM

## 2018-11-11 LAB — POCT INR: INR: 1.9 — AB (ref 2.0–3.0)

## 2018-11-11 NOTE — Telephone Encounter (Signed)
Pt requested a call back to discuss appointment.

## 2018-11-11 NOTE — Telephone Encounter (Signed)
If she is doing well she can do her appt with me on 10/22 but we should set her up for repeat las next week if she is willing. If she says yes or needs me sooner let me know so we can order

## 2018-11-11 NOTE — Telephone Encounter (Signed)
Copied from [place] [id number]. Topic: Appointment Scheduling - Scheduling Inquiry for Clinic >> Nov 11, 2018 10:20 AM Alanda Slim E wrote: Reason for CRM: Pt was discharged from the hospital on 10.01.20 for Pancreatitis and was told to follow up with PCP in 2 weeks/ But Pt has an appt on 10.22.20 and wants to know if that is fine to also do her hosp f/u/ please advise

## 2018-11-13 NOTE — Telephone Encounter (Signed)
Yes I am willing to order all. Order her a hepatic, renal, cbc with diff and crp for abdominal pain and abnormal liver functions. At [facility]

## 2018-11-13 NOTE — Telephone Encounter (Signed)
Notified pt. She voices understanding. States she continues to feel well. Pt is going to [facility] on Monday to complete a hepatic function panel for Dr Lyndel Safe. She is hoping to get all labs done at the same time. [facility] will not be able to release both doctors orders in the same lab visit. Would you be willing to order the hepatic function panel as well as any other labs that may be needed for [facility]? Advised pt we would let her know the outcome.
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
## 2018-11-14 ENCOUNTER — Telehealth (INDEPENDENT_AMBULATORY_CARE_PROVIDER_SITE_OTHER): Payer: Medicare Other | Admitting: Cardiology

## 2018-11-14 ENCOUNTER — Encounter: Payer: Self-pay | Admitting: Cardiology

## 2018-11-14 ENCOUNTER — Telehealth: Payer: Self-pay | Admitting: Gastroenterology

## 2018-11-14 VITALS — BP 114/80 | HR 67 | Ht 65.0 in | Wt 171.0 lb

## 2018-11-14 DIAGNOSIS — I480 Paroxysmal atrial fibrillation: Secondary | ICD-10-CM

## 2018-11-14 DIAGNOSIS — Z952 Presence of prosthetic heart valve: Secondary | ICD-10-CM | POA: Diagnosis not present

## 2018-11-14 NOTE — Telephone Encounter (Signed)
I have pended the lab orders below. I am getting a flag that insurance will not cover the CRP for either of the diagnoses below. Is there another code we can use?

## 2018-11-14 NOTE — Patient Instructions (Signed)
Medication Instructions:  Your physician recommends that you continue on your current medications as directed. Please refer to the Current Medication list given to you today.  If you need a refill on your cardiac medications before your next appointment, please call your pharmacy.   Lab work: NONE  Testing/Procedures: NONE  Follow-Up: You will receive a call about a follow-up appointment in January 2021.

## 2018-11-14 NOTE — Telephone Encounter (Signed)
I have add below tests and dx codes but they still aren't covering the CRP.

## 2018-11-14 NOTE — Telephone Encounter (Signed)
FYI: Mikayla [name] / [facility]  For whatever reason, I am still getting a flag in Epic that CRP is a limited coverage test and insurance may not pay for it at this time. Estimated cost in Epic is $40. I spoke with pt and she is willing to pay for test if insurance does not cover it. Orders signed. Printed ABN and faxed to [facility] to have pt sign on Monday.

## 2018-11-14 NOTE — Telephone Encounter (Signed)
Crazy. So she had an elevated CRP see if that covers it

## 2018-11-14 NOTE — Telephone Encounter (Signed)
Add a lipase and amylase and use acute pancreatitis and choledocholithiasis.

## 2018-11-14 NOTE — Telephone Encounter (Signed)
Pt reported that Dr. Charlett Blake has ordered lab work for pt to be done at [place], [place].

## 2018-11-15 ENCOUNTER — Other Ambulatory Visit (HOSPITAL_COMMUNITY): Payer: Self-pay | Admitting: Physician Assistant

## 2018-11-18 ENCOUNTER — Ambulatory Visit (INDEPENDENT_AMBULATORY_CARE_PROVIDER_SITE_OTHER): Payer: Medicare Other | Admitting: Cardiology

## 2018-11-18 ENCOUNTER — Other Ambulatory Visit (INDEPENDENT_AMBULATORY_CARE_PROVIDER_SITE_OTHER): Payer: Medicare Other

## 2018-11-18 ENCOUNTER — Telehealth: Payer: Self-pay

## 2018-11-18 DIAGNOSIS — I480 Paroxysmal atrial fibrillation: Secondary | ICD-10-CM

## 2018-11-18 DIAGNOSIS — R945 Abnormal results of liver function studies: Secondary | ICD-10-CM | POA: Diagnosis not present

## 2018-11-18 DIAGNOSIS — Z86718 Personal history of other venous thrombosis and embolism: Secondary | ICD-10-CM | POA: Diagnosis not present

## 2018-11-18 DIAGNOSIS — R7982 Elevated C-reactive protein (CRP): Secondary | ICD-10-CM | POA: Diagnosis not present

## 2018-11-18 DIAGNOSIS — R109 Unspecified abdominal pain: Secondary | ICD-10-CM | POA: Diagnosis not present

## 2018-11-18 DIAGNOSIS — Z7901 Long term (current) use of anticoagulants: Secondary | ICD-10-CM | POA: Diagnosis not present

## 2018-11-18 DIAGNOSIS — K8050 Calculus of bile duct without cholangitis or cholecystitis without obstruction: Secondary | ICD-10-CM | POA: Diagnosis not present

## 2018-11-18 DIAGNOSIS — K858 Other acute pancreatitis without necrosis or infection: Secondary | ICD-10-CM | POA: Diagnosis not present

## 2018-11-18 LAB — CBC WITH DIFFERENTIAL/PLATELET
Basophils Absolute: 0.1 10*3/uL (ref 0.0–0.1)
Basophils Relative: 1.2 % (ref 0.0–3.0)
Eosinophils Absolute: 0.3 10*3/uL (ref 0.0–0.7)
Eosinophils Relative: 4.2 % (ref 0.0–5.0)
HCT: 39.9 % (ref 36.0–46.0)
Hemoglobin: 13 g/dL (ref 12.0–15.0)
Lymphocytes Relative: 14.3 % (ref 12.0–46.0)
Lymphs Abs: 1.1 10*3/uL (ref 0.7–4.0)
MCHC: 32.5 g/dL (ref 30.0–36.0)
MCV: 91.9 fl (ref 78.0–100.0)
Monocytes Absolute: 0.6 10*3/uL (ref 0.1–1.0)
Monocytes Relative: 7.6 % (ref 3.0–12.0)
Neutro Abs: 5.4 10*3/uL (ref 1.4–7.7)
Neutrophils Relative %: 72.7 % (ref 43.0–77.0)
Platelets: 285 10*3/uL (ref 150.0–400.0)
RBC: 4.34 Mil/uL (ref 3.87–5.11)
RDW: 14.5 % (ref 11.5–15.5)
WBC: 7.4 10*3/uL (ref 4.0–10.5)

## 2018-11-18 LAB — RENAL FUNCTION PANEL
Albumin: 3.9 g/dL (ref 3.5–5.2)
BUN: 16 mg/dL (ref 6–23)
CO2: 28 mEq/L (ref 19–32)
Calcium: 9.4 mg/dL (ref 8.4–10.5)
Chloride: 105 mEq/L (ref 96–112)
Creatinine, Ser: 0.75 mg/dL (ref 0.40–1.20)
GFR: 74.33 mL/min (ref >60.00)
Glucose, Bld: 85 mg/dL (ref 70–99)
Phosphorus: 3.7 mg/dL (ref 2.3–4.6)
Potassium: 4.6 mEq/L (ref 3.5–5.1)
Sodium: 142 mEq/L (ref 135–145)

## 2018-11-18 LAB — HEPATIC FUNCTION PANEL
ALT: 12 U/L (ref 0–35)
AST: 17 U/L (ref 0–37)
Albumin: 3.9 g/dL (ref 3.5–5.2)
Alkaline Phosphatase: 85 U/L (ref 39–117)
Bilirubin, Direct: 0.2 mg/dL (ref 0.0–0.3)
Total Bilirubin: 0.5 mg/dL (ref 0.2–1.2)
Total Protein: 6.9 g/dL (ref 6.0–8.3)

## 2018-11-18 LAB — HIGH SENSITIVITY CRP: CRP, High Sensitivity: 2.77 mg/L (ref 0.000–5.000)

## 2018-11-18 LAB — LIPASE: Lipase: 26 U/L (ref 11.0–59.0)

## 2018-11-18 LAB — POCT INR: INR: 3.2 — AB (ref 2.0–3.0)

## 2018-11-18 LAB — AMYLASE: Amylase: 60 U/L (ref 27–131)

## 2018-11-18 NOTE — Telephone Encounter (Signed)
-----   Message from Timothy Lasso, RN sent at 11/01/2018  3:50 PM EDT ----- Very difficult cannulation but after precut fistulotomy was able to access the biliary tree.  Suspect stone hiding in the much larger upper portion of the bile duct tree.  With the amount of time required to just enter the biliary tree decided to leave a stent to hopefully break down stone a bit and plan for likely [facility] in the coming weeks.  She did well post procedure and I have discharged her. Reyansh Kushnir, please set up a hepatic function panel to be drawn in the next 1 to 1.5 weeks and have that return to myself and/or Dr. Lyndel Safe. Please schedule a ERCP in 4 to 6 weeks with [facility].  Patient will need to be off of Coumadin for 5 days prior to procedure with stat INR to be done that morning. She can be set up in follow-up with Dr. Lyndel Safe or myself for clinic follow-up prior to procedure. Thanks. Gave

## 2018-11-18 NOTE — Progress Notes (Signed)
Pt wsa

## 2018-11-18 NOTE — Telephone Encounter (Signed)
Very difficult cannulation but after precut fistulotomy was able to access the biliary tree.  Suspect stone hiding in the much larger upper portion of the bile duct tree.  With the amount of time required to just enter the biliary tree decided to leave a stent to hopefully break down stone a bit and plan for likely EHL in the coming weeks.  She did well post procedure and I have discharged her. Dicy Smigel, please set up a hepatic function panel to be drawn in the next 1 to 1.5 weeks and have that return to myself and/or Dr. Lyndel Safe. Please schedule a ERCP in 4 to 6 weeks with [facility].  Patient will need to be off of Coumadin for 5 days prior to procedure with stat INR to be done that morning. She can be set up in follow-up with Dr. Lyndel Safe or myself for clinic follow-up prior to procedure. Thanks. Gave

## 2018-11-19 ENCOUNTER — Telehealth: Payer: Self-pay | Admitting: *Deleted

## 2018-11-19 ENCOUNTER — Encounter: Payer: Self-pay | Admitting: Cardiology

## 2018-11-19 ENCOUNTER — Other Ambulatory Visit: Payer: Self-pay

## 2018-11-19 ENCOUNTER — Ambulatory Visit (INDEPENDENT_AMBULATORY_CARE_PROVIDER_SITE_OTHER): Payer: Medicare Other | Admitting: Cardiology

## 2018-11-19 VITALS — BP 138/66 | HR 62 | Ht 65.0 in | Wt 168.0 lb

## 2018-11-19 DIAGNOSIS — I4819 Other persistent atrial fibrillation: Secondary | ICD-10-CM | POA: Diagnosis not present
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
## 2018-11-19 NOTE — Telephone Encounter (Signed)
Dr Rush Landmark do you want the ERCP to be scheduled with yourself or Dr Lyndel Safe?

## 2018-11-19 NOTE — Telephone Encounter (Signed)
[facility] lab called about ABN was not signed at lab visit.  They will fax it back to [place] for patient to sign.  Spoke with daughter Jeral Pinch and she stated patient had an appointment with Dr. Lyndel Safe tomorrow.  I will take form up and get patient to fill out.

## 2018-11-19 NOTE — Patient Instructions (Addendum)
Medication Instructions:  Your physician recommends that you continue on your current medications as directed. Please refer to the Current Medication list given to you today.  * If you need a refill on your cardiac medications before your next appointment, please call your pharmacy.   Labwork: None ordered  Testing/Procedures: None ordered  Follow-Up: You are scheduled for a 3 month follow up with Dr. Curt Bears on 03/03/2019 @ 11:30 am   Thank you for choosing [facility]!!   Trinidad Curet, RN [phone]  Any Other Special Instructions Will Be Listed Below (If Applicable).

## 2018-11-20 ENCOUNTER — Ambulatory Visit (INDEPENDENT_AMBULATORY_CARE_PROVIDER_SITE_OTHER): Payer: Medicare Other | Admitting: Gastroenterology

## 2018-11-20 ENCOUNTER — Telehealth: Payer: Self-pay

## 2018-11-20 ENCOUNTER — Encounter: Payer: Self-pay | Admitting: Gastroenterology

## 2018-11-20 VITALS — BP 128/82 | HR 64 | Temp 98.5°F | Ht 65.0 in | Wt 171.4 lb

## 2018-11-20 DIAGNOSIS — K8050 Calculus of bile duct without cholangitis or cholecystitis without obstruction: Secondary | ICD-10-CM

## 2018-11-20 NOTE — Telephone Encounter (Signed)
First case available when December schedule comes out is fine with me. Already have 3 procedures on for the week. Thanks. GM

## 2018-11-20 NOTE — Telephone Encounter (Signed)
Dr Rush Landmark you have no available appointments until the December hospital schedule comes out.  Do you want me to add another case to your hospital week?

## 2018-11-20 NOTE — Telephone Encounter (Signed)
[facility] Pre-operative Risk Assessment     Request for surgical clearance:     Endoscopy Procedure  What type of surgery is being performed?     ERCP  When is this surgery scheduled?     1st week of November 2020  What type of clearance is required ?   Pharmacy  Are there any medications that need to be held prior to surgery and how long? Coumadin 5 days  Practice name and name of physician performing surgery?      [facility]  What is your office phone and fax number?      Phone- [phone]  [phone]  Anesthesia type (None, local, MAC, general) ?       MAC
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
## 2018-11-20 NOTE — Telephone Encounter (Signed)
I have put the pt on my list to call when the December schedule is out.

## 2018-11-20 NOTE — Addendum Note (Signed)
Addended by: Rose Phi on: 11/20/2018 04:54 PM   Modules accepted: Orders

## 2018-11-20 NOTE — Telephone Encounter (Signed)
Should be scheduled with me for Cholangioscopy and EHL if needed. Thanks. GM

## 2018-11-20 NOTE — Telephone Encounter (Signed)
Clinical pharmacist to review coumadin 

## 2018-11-20 NOTE — Telephone Encounter (Signed)
Thanks Patty  Pl inform the pt.  Also please tell her-that she can eat on Thanksgiving without any problems.  Stent is functioning well  RG

## 2018-11-20 NOTE — Patient Instructions (Signed)
If you are age 80 or older, your body mass index should be between 23-30. Your Body mass index is 28.52 kg/m. If this is out of the aforementioned range listed, please consider follow up with your Primary Care Provider.  If you are age 68 or younger, your body mass index should be between 19-25. Your Body mass index is 28.52 kg/m. If this is out of the aformentioned range listed, please consider follow up with your Primary Care Provider.   To help prevent the possible spread of infection to our patients, communities, and staff; we will be implementing the following measures:  As of now we are not allowing any visitors/family members to accompany you to any upcoming appointments with [facility]. If you have any concerns about this please contact our office to discuss prior to the appointment.   Our office will be contacting you to set up your ERCP with Dr. Rush Landmark soon.  Thank you,  Dr. Jackquline Denmark

## 2018-11-21 NOTE — Telephone Encounter (Signed)
   Primary Cardiologist: Minus Breeding, MD  Clinical pharmacist has reviewed the chart. Per office protocol, patient can hold warfarin for 5 days prior to procedure. Patient does NOT need bridging with Lovenox.  I will route this recommendation to the requesting party via Epic fax function and remove from pre-op pool.  Please call with questions.  [name], PA-C 11/21/2018, 9:44 AM     [facility] [place]  What is your office phone and fax number?       Phone- [phone]  Fax- [phone]

## 2018-11-21 NOTE — Telephone Encounter (Signed)
I have spoken with the pt and she states that she will call back if her symptoms of bloating and soreness do not improve.  She states she did speak with Dr Lyndel Safe yesterday about this and he told her she should begin to improve.

## 2018-11-21 NOTE — Telephone Encounter (Addendum)
Patient with diagnosis of afib on warfarin for anticoagulation.    Procedure:  ERCP Date of procedure: 1st week of Sept  CHADS2-VASc score of  6 (CHF, HTN, AGE, CAD, AGE, female)  Of note patient does have a history of a single remote DVT in 2010  Per office protocol, patient can hold warfarin for 5 days prior to procedure.    Patient does NOT need bridging with lovenox

## 2018-11-25 ENCOUNTER — Ambulatory Visit (INDEPENDENT_AMBULATORY_CARE_PROVIDER_SITE_OTHER): Payer: Medicare Other | Admitting: Pharmacist Clinician (PhC)/ Clinical Pharmacy Specialist

## 2018-11-25 ENCOUNTER — Ambulatory Visit: Payer: Medicare Other | Admitting: Cardiology

## 2018-11-25 DIAGNOSIS — I480 Paroxysmal atrial fibrillation: Secondary | ICD-10-CM

## 2018-11-25 DIAGNOSIS — Z7901 Long term (current) use of anticoagulants: Secondary | ICD-10-CM | POA: Diagnosis not present

## 2018-11-25 DIAGNOSIS — Z86718 Personal history of other venous thrombosis and embolism: Secondary | ICD-10-CM | POA: Diagnosis not present

## 2018-11-25 LAB — POCT INR: INR: 3.6 — AB (ref 2.0–3.0)

## 2018-11-28 ENCOUNTER — Encounter: Payer: Self-pay | Admitting: Family Medicine

## 2018-11-28 ENCOUNTER — Other Ambulatory Visit: Payer: Self-pay

## 2018-11-28 ENCOUNTER — Ambulatory Visit (INDEPENDENT_AMBULATORY_CARE_PROVIDER_SITE_OTHER): Payer: Medicare Other | Admitting: Family Medicine

## 2018-11-28 DIAGNOSIS — I480 Paroxysmal atrial fibrillation: Secondary | ICD-10-CM

## 2018-11-28 DIAGNOSIS — R609 Edema, unspecified: Secondary | ICD-10-CM | POA: Diagnosis not present

## 2018-11-28 DIAGNOSIS — K9189 Other postprocedural complications and disorders of digestive system: Secondary | ICD-10-CM | POA: Diagnosis not present

## 2018-11-28 DIAGNOSIS — K219 Gastro-esophageal reflux disease without esophagitis: Secondary | ICD-10-CM

## 2018-11-28 DIAGNOSIS — I10 Essential (primary) hypertension: Secondary | ICD-10-CM

## 2018-11-28 DIAGNOSIS — K859 Acute pancreatitis without necrosis or infection, unspecified: Secondary | ICD-10-CM

## 2018-11-28 MED ORDER — FUROSEMIDE 20 MG PO TABS: 20.0000 mg | ORAL_TABLET | Freq: Every day | ORAL | 1 refills | Status: DC | PRN

## 2018-11-28 NOTE — Progress Notes (Signed)
Needs lasix, does not use it much just wants to have some on hand.

## 2018-12-01 ENCOUNTER — Other Ambulatory Visit: Payer: Self-pay | Admitting: Family Medicine

## 2018-12-01 DIAGNOSIS — R609 Edema, unspecified: Secondary | ICD-10-CM | POA: Insufficient documentation

## 2018-12-01 NOTE — Assessment & Plan Note (Signed)
Still struggling with episodes but improved some. She has had the Amiodarone discontinued and is now on Metoprolol.

## 2018-12-01 NOTE — Assessment & Plan Note (Signed)
Still has stent in place but is hoping to have it removed soon. Is improved some but not completely

## 2018-12-01 NOTE — Assessment & Plan Note (Signed)
Monitor bp at home and let us know if concerns arise. no changes to meds. Encouraged heart healthy diet such as the DASH diet and exercise as tolerated.
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
## 2018-12-01 NOTE — Assessment & Plan Note (Signed)
Is mild and only happens occasionally. Allowed refill on Furosemide to use prn

## 2018-12-01 NOTE — Assessment & Plan Note (Signed)
Avoid offending foods, start probiotics. Do not eat large meals in late evening and consider raising head of bed. Continue Omeprazole 

## 2018-12-02 ENCOUNTER — Ambulatory Visit (INDEPENDENT_AMBULATORY_CARE_PROVIDER_SITE_OTHER): Payer: Medicare Other | Admitting: Cardiology

## 2018-12-02 ENCOUNTER — Ambulatory Visit: Payer: Medicare Other | Admitting: Gastroenterology

## 2018-12-02 DIAGNOSIS — Z7901 Long term (current) use of anticoagulants: Secondary | ICD-10-CM

## 2018-12-02 DIAGNOSIS — Z86718 Personal history of other venous thrombosis and embolism: Secondary | ICD-10-CM

## 2018-12-02 DIAGNOSIS — I480 Paroxysmal atrial fibrillation: Secondary | ICD-10-CM

## 2018-12-02 LAB — POCT INR: INR: 3.1 — AB (ref 2.0–3.0)

## 2018-12-04 ENCOUNTER — Telehealth: Payer: Self-pay

## 2018-12-04 ENCOUNTER — Other Ambulatory Visit: Payer: Self-pay

## 2018-12-04 DIAGNOSIS — K8050 Calculus of bile duct without cholangitis or cholecystitis without obstruction: Secondary | ICD-10-CM

## 2018-12-04 NOTE — Telephone Encounter (Signed)
-----   Message from Villa Herb, [place] sent at 12/03/2018  5:03 PM EDT ----- Regarding: RE: Cardia Clearance Patient was seen on the office on 10/14 when she was instructed that we would schedule her ERCP and call her with a date and time. Its in the note so do she need another ov? ----- Message ----- From: Timothy Lasso, RN Sent: 12/03/2018   4:53 PM EDT To: Villa Herb, CMA Subject: RE: Cardia Clearance                           He needs an appt with Dr Lyndel Safe to discuss and get set up at that appt.  So she just needs an appt with Dr Lyndel Safe.  ----- Message ----- From: Villa Herb, CMA Sent: 12/03/2018   4:49 PM EDT To: Timothy Lasso, RN Subject: RE: Cardia Clearance                           So does that mean I need to call the patient and set her up for an appointment 4 to 6 weeks from 10/14? I can do that if you want me to. I am just not following ----- Message ----- From: Timothy Lasso, RN Sent: 12/03/2018   3:04 PM EDT To: Villa Herb, CMA Subject: RE: Cardia Clearance                           This is the note from Dr Rush Landmark.  He wants to wait 4-6 weeks and have an appt with Dr Lyndel Safe first.     "Please schedule a ERCP in 4 to 6 weeks with [facility].  Patient will need to be off of Coumadin for 5 days prior to procedure with stat INR to be done that morning. She can be set up in follow-up with myself for clinic follow-up prior to procedure." ----- Message ----- From: Villa Herb, CMA Sent: 12/03/2018   1:36 PM EDT To: Timothy Lasso, RN Subject: Cardia Clearance                               Hey we have the cardiac clearance for this patient and it looks like she hasn't been scheduled for her ERCP with Dr. Janace Aris yet. I didn't call the patient about when to hold her warfarin

## 2018-12-04 NOTE — Telephone Encounter (Signed)
Thanks Makayia Duplessis  Pl inform the pt.  Also please tell her-that she can eat on Thanksgiving without any problems.  Stent is functioning well  RG    Me     11:26 AM Note   I have put the pt on my list to call when the December schedule is out.     Mansouraty, Telford Nab., MD to Me     11:06 AM Note   First case available when December schedule comes out is fine with me. Already have 3 procedures on for the week. Thanks. GM

## 2018-12-04 NOTE — Telephone Encounter (Signed)
The pt has been scheduled for 01/20/19 ERCP at [facility] with Dr Rush Landmark at 930 am.  COVID test on 12/10 at 945 am.  She has been ok'd to hold her coumadin 5 days prior per Dr Warren Lacy.  Information given to the pt over the phone and also mailed to her home.

## 2018-12-09 ENCOUNTER — Ambulatory Visit (INDEPENDENT_AMBULATORY_CARE_PROVIDER_SITE_OTHER): Payer: Medicare Other | Admitting: Cardiology

## 2018-12-09 DIAGNOSIS — I480 Paroxysmal atrial fibrillation: Secondary | ICD-10-CM

## 2018-12-09 DIAGNOSIS — Z7901 Long term (current) use of anticoagulants: Secondary | ICD-10-CM | POA: Diagnosis not present

## 2018-12-09 DIAGNOSIS — Z86718 Personal history of other venous thrombosis and embolism: Secondary | ICD-10-CM | POA: Diagnosis not present

## 2018-12-09 LAB — POCT INR: INR: 3.8 — AB (ref 2.0–3.0)

## 2018-12-11 ENCOUNTER — Telehealth: Payer: Medicare Other | Admitting: Cardiology

## 2018-12-16 ENCOUNTER — Ambulatory Visit (INDEPENDENT_AMBULATORY_CARE_PROVIDER_SITE_OTHER): Payer: Medicare Other | Admitting: Cardiology

## 2018-12-16 DIAGNOSIS — I480 Paroxysmal atrial fibrillation: Secondary | ICD-10-CM

## 2018-12-16 DIAGNOSIS — Z7901 Long term (current) use of anticoagulants: Secondary | ICD-10-CM | POA: Diagnosis not present

## 2018-12-16 DIAGNOSIS — Z86718 Personal history of other venous thrombosis and embolism: Secondary | ICD-10-CM | POA: Diagnosis not present

## 2018-12-16 LAB — POCT INR: INR: 2.5 (ref 2.0–3.0)

## 2018-12-23 ENCOUNTER — Ambulatory Visit (INDEPENDENT_AMBULATORY_CARE_PROVIDER_SITE_OTHER): Payer: Medicare Other | Admitting: Cardiovascular Disease

## 2018-12-23 DIAGNOSIS — Z86718 Personal history of other venous thrombosis and embolism: Secondary | ICD-10-CM | POA: Diagnosis not present

## 2018-12-23 DIAGNOSIS — Z7901 Long term (current) use of anticoagulants: Secondary | ICD-10-CM | POA: Diagnosis not present

## 2018-12-23 DIAGNOSIS — I480 Paroxysmal atrial fibrillation: Secondary | ICD-10-CM | POA: Diagnosis not present

## 2018-12-23 LAB — POCT INR: INR: 2.4 (ref 2.0–3.0)

## 2018-12-30 ENCOUNTER — Ambulatory Visit (INDEPENDENT_AMBULATORY_CARE_PROVIDER_SITE_OTHER): Payer: Medicare Other | Admitting: Pharmacist

## 2018-12-30 LAB — POCT INR: INR: 2.2 (ref 2.0–3.0)

## 2019-01-06 ENCOUNTER — Ambulatory Visit (INDEPENDENT_AMBULATORY_CARE_PROVIDER_SITE_OTHER): Payer: Medicare Other | Admitting: Internal Medicine

## 2019-01-06 DIAGNOSIS — I480 Paroxysmal atrial fibrillation: Secondary | ICD-10-CM

## 2019-01-06 DIAGNOSIS — Z86718 Personal history of other venous thrombosis and embolism: Secondary | ICD-10-CM | POA: Diagnosis not present

## 2019-01-06 DIAGNOSIS — Z7901 Long term (current) use of anticoagulants: Secondary | ICD-10-CM

## 2019-01-06 LAB — POCT INR: INR: 2.2 (ref 2.0–3.0)

## 2019-01-07 ENCOUNTER — Other Ambulatory Visit: Payer: Self-pay

## 2019-01-07 ENCOUNTER — Ambulatory Visit (INDEPENDENT_AMBULATORY_CARE_PROVIDER_SITE_OTHER): Payer: Medicare Other | Admitting: Family Medicine

## 2019-01-07 DIAGNOSIS — K219 Gastro-esophageal reflux disease without esophagitis: Secondary | ICD-10-CM

## 2019-01-07 DIAGNOSIS — K8050 Calculus of bile duct without cholangitis or cholecystitis without obstruction: Secondary | ICD-10-CM | POA: Diagnosis not present

## 2019-01-07 DIAGNOSIS — I10 Essential (primary) hypertension: Secondary | ICD-10-CM

## 2019-01-07 DIAGNOSIS — I480 Paroxysmal atrial fibrillation: Secondary | ICD-10-CM | POA: Diagnosis not present

## 2019-01-09 NOTE — Assessment & Plan Note (Signed)
Well controlled at this time. No changes. Continues to follow with cardiology

## 2019-01-09 NOTE — Assessment & Plan Note (Signed)
Avoid offending foods, start probiotics. Do not eat large meals in late evening and consider raising head of bed.  

## 2019-01-09 NOTE — Assessment & Plan Note (Signed)
Well controlled, no changes to meds. Encouraged heart healthy diet such as the DASH diet and exercise as tolerated.  °
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
## 2019-01-09 NOTE — Assessment & Plan Note (Signed)
Is scheduled for next GI procedure in 2 weeks but is doing much better in terms of appetite, abdominal pain etc.

## 2019-01-13 ENCOUNTER — Ambulatory Visit (INDEPENDENT_AMBULATORY_CARE_PROVIDER_SITE_OTHER): Payer: Medicare Other | Admitting: Cardiology

## 2019-01-13 DIAGNOSIS — I480 Paroxysmal atrial fibrillation: Secondary | ICD-10-CM | POA: Diagnosis not present

## 2019-01-13 DIAGNOSIS — Z86718 Personal history of other venous thrombosis and embolism: Secondary | ICD-10-CM

## 2019-01-13 DIAGNOSIS — Z7901 Long term (current) use of anticoagulants: Secondary | ICD-10-CM | POA: Diagnosis not present

## 2019-01-13 LAB — POCT INR: INR: 2.3 (ref 2.0–3.0)

## 2019-01-16 ENCOUNTER — Encounter (HOSPITAL_COMMUNITY): Payer: Self-pay | Admitting: Gastroenterology

## 2019-01-16 ENCOUNTER — Telehealth: Payer: Self-pay | Admitting: Gastroenterology

## 2019-01-16 ENCOUNTER — Other Ambulatory Visit (HOSPITAL_COMMUNITY)
Admission: RE | Admit: 2019-01-16 | Discharge: 2019-01-16 | Disposition: A | Discharge status: Home / Self Care | Payer: Medicare Other | Source: Ambulatory Visit | Attending: Gastroenterology | Admitting: Gastroenterology

## 2019-01-16 ENCOUNTER — Other Ambulatory Visit: Payer: Self-pay

## 2019-01-16 DIAGNOSIS — Z20828 Contact with and (suspected) exposure to other viral communicable diseases: Secondary | ICD-10-CM | POA: Diagnosis not present

## 2019-01-16 DIAGNOSIS — Z01812 Encounter for preprocedural laboratory examination: Secondary | ICD-10-CM | POA: Diagnosis present

## 2019-01-16 NOTE — Telephone Encounter (Signed)
No need to check INR prior to coming. Thanks. GM

## 2019-01-16 NOTE — Progress Notes (Addendum)
Mikayla [name] denies chest pain or shortness of breath.  Mikayla [name] was tested for Covid today and is in quarantine with her daughter. Mikayla [name] is on Coumadin and has not been instructed to stop it. Patient has a call to Dr. Gabriel Carina asking for information. I will follow up with the office.  01/13/2019- INR was 2.3.  I called the office and was read the instructions regarding Coumadin- hold  X 5 days, approval by cardiologist; I asked to have information ax to the hospital.

## 2019-01-16 NOTE — Progress Notes (Signed)
Mikayla [name] denies chest pain or shortness of breath.  Patient has been tested for Covid and is in quarantine with her daughter.  Mikayla [name] is on Coumadin, patient checks INR at home- the rest on 01/13/2019 was 2.3. Mikayla [name] is still on Coumadin, she has called Dr. Janace Aris office and asked fro instruction regarding stopping Coumandin, she has not received a call back.

## 2019-01-16 NOTE — Telephone Encounter (Signed)
I spoke with the pt and she has held her blood thinner but would like to know if she needs to check her INR Monday morning and call with result prior to traveling to the hospital for procedure.  Please advise

## 2019-01-17 LAB — NOVEL CORONAVIRUS, NAA (HOSP ORDER, SEND-OUT TO REF LAB; TAT 18-24 HRS): SARS-CoV-2, NAA: NOT DETECTED

## 2019-01-17 NOTE — Telephone Encounter (Signed)
The patient has been notified of this information and all questions answered.

## 2019-01-19 NOTE — Anesthesia Preprocedure Evaluation (Addendum)
Anesthesia Evaluation  Patient identified by MRN, date of birth, ID band Patient awake    Reviewed: Allergy & Precautions, NPO status , Patient's Chart, lab work & pertinent test results  History of Anesthesia Complications (+) PONV and history of anesthetic complications  Airway Mallampati: II  TM Distance: >3 FB Neck ROM: Full    Dental  (+) Edentulous Lower, Edentulous Upper, Dental Advisory Given   Pulmonary neg pulmonary ROS,    Pulmonary exam normal        Cardiovascular hypertension, Pt. on home beta blockers and Pt. on medications + CAD, + Peripheral Vascular Disease (carotid stenosis), +CHF and + DVT  Normal cardiovascular exam+ dysrhythmias Atrial Fibrillation + Valvular Problems/Murmurs (s/p TAVR 2019) AS   S/P TAVR TTE 10/2018 EF 60-65%, normally functioning TAVR with mean gradient of 7.8 mm Hg and no PVL  LHC 2019 Ost 2nd Mrg lesion is 20% stenosed. Ost LAD to Prox LAD lesion is 20% stenosed. Prox LAD lesion is 30% stenosed. Ost LM to Mid LM lesion is 20% stenosed. Ost 1st Diag lesion is 70% stenosed. 1. Mild non-obstructive CAD. The small caliber diagonal branch has a moderate stenosis, too small for PCI.  2. Severe aortic stenosis by echo findings. By cath mean gradient 26 mmHg, peak to peak gradient 21 mmHg, AVA 1.38 cm2.    Neuro/Psych Dementia negative neurological ROS     GI/Hepatic Neg liver ROS, GERD  ,  Endo/Other  negative endocrine ROS  Renal/GU negative Renal ROS  negative genitourinary   Musculoskeletal  (+) Arthritis ,   Abdominal   Peds  Hematology  (+) Blood dyscrasia (on coumadin), ,   Anesthesia Other Findings ERCP for CBD stone  Reproductive/Obstetrics                            Anesthesia Physical  Anesthesia Plan  ASA: III  Anesthesia Plan: General   Post-op Pain Management:    Induction: Intravenous  PONV Risk Score and Plan: 4 or  greater and Dexamethasone, Ondansetron and Treatment may vary due to age or medical condition  Airway Management Planned: Oral ETT  Additional Equipment:   Intra-op Plan:   Post-operative Plan: Extubation in OR  Informed Consent: I have reviewed the patients History and Physical, chart, labs and discussed the procedure including the risks, benefits and alternatives for the proposed anesthesia with the patient or authorized representative who has indicated his/her understanding and acceptance.     Dental advisory given  Plan Discussed with: CRNA and Anesthesiologist  Anesthesia Plan Comments:        Anesthesia Quick Evaluation

## 2019-01-20 ENCOUNTER — Other Ambulatory Visit: Payer: Self-pay

## 2019-01-20 ENCOUNTER — Ambulatory Visit (HOSPITAL_COMMUNITY): Payer: Medicare Other | Admitting: Anesthesiology

## 2019-01-20 ENCOUNTER — Encounter (HOSPITAL_COMMUNITY)
Admission: RE | Disposition: A | Discharge status: Home / Self Care | Payer: Self-pay | Source: Home / Self Care | Attending: Gastroenterology

## 2019-01-20 ENCOUNTER — Ambulatory Visit (HOSPITAL_COMMUNITY): Payer: Medicare Other

## 2019-01-20 ENCOUNTER — Ambulatory Visit (HOSPITAL_COMMUNITY)
Admission: RE | Admit: 2019-01-20 | Discharge: 2019-01-20 | Disposition: A | Discharge status: Home / Self Care | Payer: Medicare Other | Attending: Gastroenterology | Admitting: Gastroenterology

## 2019-01-20 ENCOUNTER — Encounter (HOSPITAL_COMMUNITY): Payer: Self-pay | Admitting: Gastroenterology

## 2019-01-20 DIAGNOSIS — Z86718 Personal history of other venous thrombosis and embolism: Secondary | ICD-10-CM | POA: Insufficient documentation

## 2019-01-20 DIAGNOSIS — Z96642 Presence of left artificial hip joint: Secondary | ICD-10-CM | POA: Insufficient documentation

## 2019-01-20 DIAGNOSIS — I2510 Atherosclerotic heart disease of native coronary artery without angina pectoris: Secondary | ICD-10-CM | POA: Insufficient documentation

## 2019-01-20 DIAGNOSIS — F0390 Unspecified dementia without behavioral disturbance: Secondary | ICD-10-CM | POA: Diagnosis not present

## 2019-01-20 DIAGNOSIS — K8050 Calculus of bile duct without cholangitis or cholecystitis without obstruction: Secondary | ICD-10-CM | POA: Diagnosis not present

## 2019-01-20 DIAGNOSIS — I739 Peripheral vascular disease, unspecified: Secondary | ICD-10-CM | POA: Insufficient documentation

## 2019-01-20 DIAGNOSIS — I509 Heart failure, unspecified: Secondary | ICD-10-CM | POA: Diagnosis not present

## 2019-01-20 DIAGNOSIS — Z4659 Encounter for fitting and adjustment of other gastrointestinal appliance and device: Secondary | ICD-10-CM | POA: Diagnosis not present

## 2019-01-20 DIAGNOSIS — I110 Hypertensive heart disease with heart failure: Secondary | ICD-10-CM | POA: Insufficient documentation

## 2019-01-20 DIAGNOSIS — R932 Abnormal findings on diagnostic imaging of liver and biliary tract: Secondary | ICD-10-CM | POA: Diagnosis not present

## 2019-01-20 DIAGNOSIS — R7989 Other specified abnormal findings of blood chemistry: Secondary | ICD-10-CM

## 2019-01-20 HISTORY — PX: SPYGLASS CHOLANGIOSCOPY: SHX5441

## 2019-01-20 HISTORY — DX: Cardiac arrhythmia, unspecified: I49.9

## 2019-01-20 HISTORY — PX: REMOVAL OF STONES: SHX5545

## 2019-01-20 HISTORY — PX: ENDOSCOPIC RETROGRADE CHOLANGIOPANCREATOGRAPHY (ERCP) WITH PROPOFOL: SHX5810

## 2019-01-20 HISTORY — PX: BILIARY DILATION: SHX6850

## 2019-01-20 HISTORY — PX: SPYGLASS LITHOTRIPSY: SHX5537

## 2019-01-20 HISTORY — PX: STENT REMOVAL: SHX6421

## 2019-01-20 LAB — HEPATIC FUNCTION PANEL
ALT: 12 U/L (ref 0–44)
AST: 14 U/L — ABNORMAL LOW (ref 15–41)
Albumin: 2.7 g/dL — ABNORMAL LOW (ref 3.5–5.0)
Alkaline Phosphatase: 46 U/L (ref 38–126)
Bilirubin, Direct: 0.1 mg/dL (ref 0.0–0.2)
Indirect Bilirubin: 0.7 mg/dL (ref 0.3–0.9)
Total Bilirubin: 0.8 mg/dL (ref 0.3–1.2)
Total Protein: 4.9 g/dL — ABNORMAL LOW (ref 6.5–8.1)

## 2019-01-20 SURGERY — ENDOSCOPIC RETROGRADE CHOLANGIOPANCREATOGRAPHY (ERCP) WITH PROPOFOL
Anesthesia: General

## 2019-01-20 MED ORDER — LIDOCAINE 2% (20 MG/ML) 5 ML SYRINGE
INTRAMUSCULAR | Status: DC | PRN
  Administered 2019-01-20: 60 mg via INTRAVENOUS

## 2019-01-20 MED ORDER — CIPROFLOXACIN IN D5W 400 MG/200ML IV SOLN
INTRAVENOUS | Status: AC
  Filled 2019-01-20: qty 200

## 2019-01-20 MED ORDER — PROPOFOL 10 MG/ML IV BOLUS
INTRAVENOUS | Status: DC | PRN
  Administered 2019-01-20: 100 mg via INTRAVENOUS
  Administered 2019-01-20: 20 mg via INTRAVENOUS

## 2019-01-20 MED ORDER — MIDAZOLAM HCL 5 MG/5ML IJ SOLN
INTRAMUSCULAR | Status: DC | PRN
  Administered 2019-01-20: 2 mg via INTRAVENOUS

## 2019-01-20 MED ORDER — CIPROFLOXACIN HCL 500 MG PO TABS: 500.0000 mg | ORAL_TABLET | Freq: Two times a day (BID) | ORAL | 0 refills | Status: AC

## 2019-01-20 MED ORDER — GLUCAGON HCL RDNA (DIAGNOSTIC) 1 MG IJ SOLR
INTRAMUSCULAR | Status: AC
  Filled 2019-01-20: qty 1

## 2019-01-20 MED ORDER — FENTANYL CITRATE (PF) 100 MCG/2ML IJ SOLN
INTRAMUSCULAR | Status: DC | PRN
  Administered 2019-01-20: 100 ug via INTRAVENOUS

## 2019-01-20 MED ORDER — PHENYLEPHRINE HCL-NACL 10-0.9 MG/250ML-% IV SOLN
INTRAVENOUS | Status: DC | PRN
  Administered 2019-01-20: 50 ug/min via INTRAVENOUS

## 2019-01-20 MED ORDER — SODIUM CHLORIDE 0.9 % IV SOLN: INTRAVENOUS | Status: DC

## 2019-01-20 MED ORDER — CIPROFLOXACIN IN D5W 400 MG/200ML IV SOLN
INTRAVENOUS | Status: DC | PRN
  Administered 2019-01-20: 400 mg via INTRAVENOUS

## 2019-01-20 MED ORDER — INDOMETHACIN 50 MG RE SUPP
RECTAL | Status: DC | PRN
  Administered 2019-01-20: 100 mg via RECTAL

## 2019-01-20 MED ORDER — DEXAMETHASONE SODIUM PHOSPHATE 10 MG/ML IJ SOLN
INTRAMUSCULAR | Status: DC | PRN
  Administered 2019-01-20: 4 mg via INTRAVENOUS

## 2019-01-20 MED ORDER — INDOMETHACIN 50 MG RE SUPP
RECTAL | Status: AC
  Filled 2019-01-20: qty 2

## 2019-01-20 MED ORDER — ONDANSETRON HCL 4 MG/2ML IJ SOLN
INTRAMUSCULAR | Status: DC | PRN
  Administered 2019-01-20: 4 mg via INTRAVENOUS

## 2019-01-20 MED ORDER — SUCCINYLCHOLINE CHLORIDE 200 MG/10ML IV SOSY
PREFILLED_SYRINGE | INTRAVENOUS | Status: DC | PRN
  Administered 2019-01-20: 100 mg via INTRAVENOUS

## 2019-01-20 MED ORDER — SODIUM CHLORIDE 0.9 % IV SOLN
INTRAVENOUS | Status: DC | PRN
  Administered 2019-01-20: 45 mL

## 2019-01-20 MED ORDER — EPHEDRINE SULFATE-NACL 50-0.9 MG/10ML-% IV SOSY
PREFILLED_SYRINGE | INTRAVENOUS | Status: DC | PRN
  Administered 2019-01-20: 10 mg via INTRAVENOUS

## 2019-01-20 MED ORDER — LACTATED RINGERS IV SOLN
INTRAVENOUS | Status: DC | PRN
  Administered 2019-01-20 (×2): via INTRAVENOUS

## 2019-01-20 MED ORDER — GLUCAGON HCL RDNA (DIAGNOSTIC) 1 MG IJ SOLR
INTRAMUSCULAR | Status: DC | PRN
  Administered 2019-01-20 (×2): .25 mg via INTRAVENOUS

## 2019-01-20 NOTE — Transfer of Care (Signed)
Immediate Anesthesia Transfer of Care Note  Patient: Mikayla [name]  Procedure(s) Performed: ENDOSCOPIC RETROGRADE CHOLANGIOPANCREATOGRAPHY (ERCP) WITH PROPOFOL (N/A ) SPYGLASS CHOLANGIOSCOPY (N/A ) SPYGLASS LITHOTRIPSY (N/A ) STENT REMOVAL REMOVAL OF STONES BILIARY DILATION  Patient Location: PACU  Anesthesia Type:General  Level of Consciousness: drowsy and patient cooperative  Airway & Oxygen Therapy: Patient Spontanous Breathing and Patient connected to nasal cannula oxygen  Post-op Assessment: Report given to RN, Post -op Vital signs reviewed and stable and Patient moving all extremities  Post vital signs: Reviewed and stable  Last Vitals:  Vitals Value Taken Time  BP 122/49 01/20/19 1139  Temp    Pulse 61 01/20/19 1141  Resp 15 01/20/19 1141  SpO2 99 % 01/20/19 1141  Vitals shown include unvalidated device data.  Last Pain:  Vitals:   01/20/19 0750  TempSrc: Oral  PainSc: 0-No pain         Complications: No apparent anesthesia complications

## 2019-01-20 NOTE — Progress Notes (Signed)
Order for lab work placed in [place] at this time;

## 2019-01-20 NOTE — Progress Notes (Signed)
Left message for patient's daughter-Shahri-verified DPR- to call back to the office to speak about repeat lab work and need for f/u appt with Dr. Lyndel Safe in 4-6 weeks;
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
## 2019-01-20 NOTE — Anesthesia Procedure Notes (Signed)
Procedure Name: Intubation Date/Time: 01/20/2019 9:41 AM Performed by: Moshe Salisbury, CRNA Pre-anesthesia Checklist: Patient identified, Emergency Drugs available, Suction available and Patient being monitored Patient Re-evaluated:Patient Re-evaluated prior to induction Oxygen Delivery Method: Circle System Utilized Preoxygenation: Pre-oxygenation with 100% oxygen Induction Type: IV induction Ventilation: Mask ventilation without difficulty Laryngoscope Size: Mac and 4 Grade View: Grade I Tube type: Oral Tube size: 7.5 mm Number of attempts: 1 Airway Equipment and Method: Stylet Placement Confirmation: ETT inserted through vocal cords under direct vision,  positive ETCO2 and breath sounds checked- equal and bilateral Secured at: 20 cm Tube secured with: Tape Dental Injury: Teeth and Oropharynx as per pre-operative assessment

## 2019-01-20 NOTE — Anesthesia Postprocedure Evaluation (Signed)
Anesthesia Post Note  Patient: Shequitta Raths  Procedure(s) Performed: ENDOSCOPIC RETROGRADE CHOLANGIOPANCREATOGRAPHY (ERCP) WITH PROPOFOL (N/A ) SPYGLASS CHOLANGIOSCOPY (N/A ) SPYGLASS LITHOTRIPSY (N/A ) STENT REMOVAL REMOVAL OF STONES BILIARY DILATION     Patient location during evaluation: PACU Anesthesia Type: General Level of consciousness: sedated Pain management: pain level controlled Vital Signs Assessment: post-procedure vital signs reviewed and stable Respiratory status: spontaneous breathing and respiratory function stable Cardiovascular status: stable Postop Assessment: no apparent nausea or vomiting Anesthetic complications: no    Last Vitals:  Vitals:   01/20/19 1210 01/20/19 1211  BP: (!) 130/56   Pulse: (!) 55   Resp: 15   Temp:  36.4 C  SpO2: 93%     Last Pain:  Vitals:   01/20/19 1211  TempSrc:   PainSc: 0-No pain                 Kirston Luty DANIEL

## 2019-01-20 NOTE — Op Note (Addendum)
[facility] Patient Name: Mikayla [name] Procedure Date : 01/20/2019 MRN: [id number] Attending MD: Justice Britain , MD Date of Birth: August 06, 1940 CSN: [id number] Age: 78 Admit Type: Outpatient Procedure:                ERCP Indications:              Bile duct stone(s), Abnormal MRCP, Biliary stent                            removal Providers:                Justice Britain, MD, Vista Lawman, RN, Elspeth Cho Tech., Technician, Claybon Jabs CRNA, CRNA Referring MD:             Jackquline Denmark, MD Medicines:                General Anesthesia, Cipro 400 mg IV, Glucagon 0.5                            mg IV, Indomethacin 100 mg PR (previously tolerated                            with last ERCP and had post-ERCP pancreatitis, so                            gave even though allergy noted in chart from                            previous Oral intake) Complications:            No immediate complications. Estimated Blood Loss:     Estimated blood loss was minimal. Procedure:                Pre-Anesthesia Assessment:                           - Prior to the procedure, a History and Physical                            was performed, and patient medications and                            allergies were reviewed. The patient's tolerance of                            previous anesthesia was also reviewed. The risks                            and benefits of the procedure and the sedation                            options and risks were discussed with the patient.  All questions were answered, and informed consent                            was obtained. Prior Anticoagulants: The patient has                            taken Coumadin (warfarin), last dose was 5 days                            prior to procedure. ASA Grade Assessment: III - A                            patient with severe systemic disease. After                             reviewing the risks and benefits, the patient was                            deemed in satisfactory condition to undergo the                            procedure.                           After obtaining informed consent, the scope was                            passed under direct vision. Throughout the                            procedure, the patient's blood pressure, pulse, and                            oxygen saturations were monitored continuously. The                            TJF-Q180V ([id number]) Olympus duodenoscope was                            introduced through the mouth, and used to inject                            contrast into and used for direct visualization of                            the bile duct. The ERCP was accomplished without                            difficulty. The patient tolerated the procedure. Scope In: Scope Out: Findings:      A scout film of the abdomen was obtained. Surgical clips, consistent       with previous cholecystectomy, were seen in the area of the right upper       quadrant of the abdomen. One stent ending in the  main bile duct was seen.      The esophagus was successfully intubated under direct vision without       detailed examination of the pharynx, larynx, and associated structures,       and upper GI tract. A biliary sphincterotomy had been performed. The       sphincterotomy appeared open. One plastic biliary stent originating in       the biliary tree was emerging from the major papilla. The stent was       visibly patent. One stent was removed from the biliary tree using a       snare.      A short 0.035 inch Soft Jagwire was passed into the biliary tree. The       Autotome sphincterotome was passed over the guidewire and the bile duct       was then deeply cannulated. Contrast was injected. I personally       interpreted the bile duct images. Ductal flow of contrast was adequate.       Image quality was adequate. Contrast  extended to the hepatic ducts.       Opacification of the entire biliary tree except for the cystic duct and       gallbladder was successful. The middle third of the main bile duct and       upper third of the main bile duct were severely dilated. The largest       diameter was approximately 20 mm. The middle third of the main bile duct       contained a filling defect thought to be a stone. Dilation of the common       bile duct with a 11-17-10 mm balloon (to a maximum balloon size of 12       mm) dilator was successful as a sphincteroplasty for 4-minutes total. To       discover objects, the biliary tree was swept with a retrieval balloon       starting at the bifurcation. Sludge was swept from the duct. There       continued to be persistent filling defect that I could not grab due to       the enlargement in the proximal CHD prior to hepatic ducts. I decided to       proceed with Spyglass.      The bile duct was explored endoscopically using the SpyGlass direct       visualization system. The SpyGlass probe was advanced to the       bifurcation. Visibility with the probe was good. The upper third of the       main bile duct contained one stone, which was 12 mm in diameter.       Electrohydraulic lithotripsy was successful to break it up into       fragments.      A short 0.035 inch Soft Jagwire was replaced into the biliary tree. The       Autotome sphincterotome was passed over the guidewire and the bile duct       was then deeply cannulated. The biliary tree was swept with a retrieval       balloon starting at the bifurcation. A few stone fragments were removed.       No stones remained. An occlusion cholangiogram was performed that showed       no further significant biliary pathology other than the significantly       dilated biliary  tree.      A pancreatogram was not performed.      The duodenoscope was withdrawn from the patient. Impression:               - Prior biliary  sphincterotomy appeared open.                           - One visibly patent stent from the biliary tree                            was seen in the major papilla. This was removed.                           - A filling defect consistent with a stone was seen                            on the cholangiogram.                           - The upper third of the main bile duct and middle                            third of the main bile duct were severely dilated.                           - Common bile duct was successfully dilated as a                            sphincteroplasty.                           - The biliary tree was swept and sludge was found,                            unable to remove stone burden with balloon sweep.                            Decision made to pursue Cholangioscopy.                            Choledocholithiasis was found. Complete removal was                            accomplished after Lithotripsy was successful and                            subsequent balloon sweep. Recommendation:           - The patient will be observed post-procedure,                            until all discharge criteria are met.                           - Discharge patient to home.                           -  Observe patient's clinical course.                           - Check liver enzymes (AST, ALT, alkaline                            phosphatase, bilirubin) in 2 weeks.                           - Watch for pancreatitis, bleeding, perforation,                            and cholangitis.                           - Ciprofloxacin 500 mg BID x 3-days to decrease                            risk of post-infectious ERCP complications.                           - Coumadin restart in 48 hours.                           - The findings and recommendations were discussed                            with the patient.                           - The findings and recommendations were discussed                             with the patient's family. Procedure Code(s):        --- Professional ---                           [phone], Endoscopic retrograde                            cholangiopancreatography (ERCP); with destruction                            of calculi, any method (eg, mechanical,                            electrohydraulic, lithotripsy)                           [id number], Endoscopic retrograde                            cholangiopancreatography (ERCP); with removal of                            foreign body(s) or stent(s) from biliary/pancreatic  duct(s)                           [phone], Endoscopic cannulation of papilla with                            direct visualization of pancreatic/common bile                            duct(s) (List separately in addition to code(s) for                            primary procedure) Diagnosis Code(s):        --- Professional ---                           Z96.89, Presence of other specified functional                            implants                           R93.2, Abnormal findings on diagnostic imaging of                            liver and biliary tract                           K80.50, Calculus of bile duct without cholangitis                            or cholecystitis without obstruction                           Z46.59, Encounter for fitting and adjustment of                            other gastrointestinal appliance and device                           K83.8, Other specified diseases of biliary tract CPT copyright 2019 American [employer] Association. All rights reserved. The codes documented in this report are preliminary and upon coder review may  be revised to meet current compliance requirements. Justice Britain, MD 01/20/2019 11:53:04 AM Number of Addenda: 0

## 2019-01-21 ENCOUNTER — Encounter: Payer: Self-pay | Admitting: Cardiology

## 2019-01-21 LAB — PROTIME-INR: INR: 1.2 — AB (ref 0.9–1.1)

## 2019-01-21 NOTE — Progress Notes (Signed)
Patient returned call to the office to scheduled appt with [facility];
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
## 2019-01-27 ENCOUNTER — Ambulatory Visit (INDEPENDENT_AMBULATORY_CARE_PROVIDER_SITE_OTHER): Payer: Medicare Other | Admitting: *Deleted

## 2019-01-27 ENCOUNTER — Encounter: Payer: Self-pay | Admitting: *Deleted

## 2019-01-27 ENCOUNTER — Other Ambulatory Visit: Payer: Self-pay

## 2019-01-27 VITALS — BP 142/73 | HR 58 | Wt 172.0 lb

## 2019-01-27 DIAGNOSIS — Z0000 Encounter for general adult medical examination without abnormal findings: Secondary | ICD-10-CM

## 2019-01-27 NOTE — Patient Instructions (Signed)
See you next year!  Continue to eat heart healthy diet (full of fruits, vegetables, whole grains, lean protein, water--limit salt, fat, and sugar intake) and increase physical activity as tolerated.  Continue doing brain stimulating activities (puzzles, reading, adult coloring books, staying active) to keep memory sharp.    Mikayla [name] , Thank you for taking time to come for your Medicare Wellness Visit. I appreciate your ongoing commitment to your health goals. Please review the following plan we discussed and let me know if I can assist you in the future.   These are the goals we discussed: Goals    . Maintain current health and independence. (pt-stated)       This is a list of the screening recommended for you and due dates:  Health Maintenance  Topic Date Due  . Tetanus Vaccine  12/06/2021  . Flu Shot  Completed  . DEXA scan (bone density measurement)  Completed  . Pneumonia vaccines  Addressed    Preventive Care 80 Years and Older, Female Preventive care refers to lifestyle choices and visits with your health care provider that can promote health and wellness. This includes:  A yearly physical exam. This is also called an annual well check.  Regular dental and eye exams.  Immunizations.  Screening for certain conditions.  Healthy lifestyle choices, such as diet and exercise. What can I expect for my preventive care visit? Physical exam Your health care provider will check:  Height and weight. These may be used to calculate body mass index (BMI), which is a measurement that tells if you are at a healthy weight.  Heart rate and blood pressure.  Your skin for abnormal spots. Counseling Your health care provider may ask you questions about:  Alcohol, tobacco, and drug use.  Emotional well-being.  Home and relationship well-being.  Sexual activity.  Eating habits.  History of falls.  Memory and ability to understand (cognition).  Work and work  Statistician.  Pregnancy and menstrual history. What immunizations do I need?  Influenza (flu) vaccine  This is recommended every year. Tetanus, diphtheria, and pertussis (Tdap) vaccine  You may need a Td booster every 10 years. Varicella (chickenpox) vaccine  You may need this vaccine if you have not already been vaccinated. Zoster (shingles) vaccine  You may need this after age 80. Pneumococcal conjugate (PCV13) vaccine  One dose is recommended after age 80. Pneumococcal polysaccharide (PPSV23) vaccine  One dose is recommended after age 18. Measles, mumps, and rubella (MMR) vaccine  You may need at least one dose of MMR if you were born in 1957 or later. You may also need a second dose. Meningococcal conjugate (MenACWY) vaccine  You may need this if you have certain conditions. Hepatitis A vaccine  You may need this if you have certain conditions or if you travel or work in places where you may be exposed to hepatitis A. Hepatitis B vaccine  You may need this if you have certain conditions or if you travel or work in places where you may be exposed to hepatitis B. Haemophilus influenzae type b (Hib) vaccine  You may need this if you have certain conditions. You may receive vaccines as individual doses or as more than one vaccine together in one shot (combination vaccines). Talk with your health care provider about the risks and benefits of combination vaccines. What tests do I need? Blood tests  Lipid and cholesterol levels. These may be checked every 5 years, or more frequently depending on your overall  health.  Hepatitis C test.  Hepatitis B test. Screening  Lung cancer screening. You may have this screening every year starting at age 80 if you have a 30-pack-year history of smoking and currently smoke or have quit within the past 15 years.  Colorectal cancer screening. All adults should have this screening starting at age 80 and continuing until age 35. Your  health care provider may recommend screening at age 51 if you are at increased risk. You will have tests every 1-10 years, depending on your results and the type of screening test.  Diabetes screening. This is done by checking your blood sugar (glucose) after you have not eaten for a while (fasting). You may have this done every 1-3 years.  Mammogram. This may be done every 1-2 years. Talk with your health care provider about how often you should have regular mammograms.  BRCA-related cancer screening. This may be done if you have a family history of breast, ovarian, tubal, or peritoneal cancers. Other tests  Sexually transmitted disease (STD) testing.  Bone density scan. This is done to screen for osteoporosis. You may have this done starting at age 40. Follow these instructions at home: Eating and drinking  Eat a diet that includes fresh fruits and vegetables, whole grains, lean protein, and low-fat dairy products. Limit your intake of foods with high amounts of sugar, saturated fats, and salt.  Take vitamin and mineral supplements as recommended by your health care provider.  Do not drink alcohol if your health care provider tells you not to drink.  If you drink alcohol: ? Limit how much you have to 0-1 drink a day. ? Be aware of how much alcohol is in your drink. In the [place] one drink equals one 12 oz bottle of beer (355 mL), one 5 oz glass of wine (148 mL), or one 1 oz glass of hard liquor (44 mL). Lifestyle  Take daily care of your teeth and gums.  Stay active. Exercise for at least 30 minutes on 5 or more days each week.  Do not use any products that contain nicotine or tobacco, such as cigarettes, e-cigarettes, and chewing tobacco. If you need help quitting, ask your health care provider.  If you are sexually active, practice safe sex. Use a condom or other form of protection in order to prevent STIs (sexually transmitted infections).  Talk with your health care provider  about taking a low-dose aspirin or statin. What's next?  Go to your health care provider once a year for a well check visit.  Ask your health care provider how often you should have your eyes and teeth checked.  Stay up to date on all vaccines. This information is not intended to replace advice given to you by your health care provider. Make sure you discuss any questions you have with your health care provider. Document Released: 02/19/2015 Document Revised: 01/17/2018 Document Reviewed: 01/17/2018 Elsevier Patient Education  2020 [employer].

## 2019-01-29 ENCOUNTER — Other Ambulatory Visit: Payer: Self-pay | Admitting: Family Medicine

## 2019-01-29 ENCOUNTER — Ambulatory Visit (INDEPENDENT_AMBULATORY_CARE_PROVIDER_SITE_OTHER): Payer: Medicare Other | Admitting: Cardiovascular Disease

## 2019-01-29 DIAGNOSIS — Z7901 Long term (current) use of anticoagulants: Secondary | ICD-10-CM

## 2019-01-29 DIAGNOSIS — Z86718 Personal history of other venous thrombosis and embolism: Secondary | ICD-10-CM

## 2019-01-29 DIAGNOSIS — I480 Paroxysmal atrial fibrillation: Secondary | ICD-10-CM

## 2019-01-29 LAB — POCT INR: INR: 1.4 — AB (ref 2.0–3.0)

## 2019-02-04 ENCOUNTER — Other Ambulatory Visit (INDEPENDENT_AMBULATORY_CARE_PROVIDER_SITE_OTHER): Payer: Medicare Other

## 2019-02-04 DIAGNOSIS — R7989 Other specified abnormal findings of blood chemistry: Secondary | ICD-10-CM

## 2019-02-04 LAB — HEPATIC FUNCTION PANEL
ALT: 12 U/L (ref 0–35)
AST: 18 U/L (ref 0–37)
Albumin: 3.9 g/dL (ref 3.5–5.2)
Alkaline Phosphatase: 72 U/L (ref 39–117)
Bilirubin, Direct: 0.1 mg/dL (ref 0.0–0.3)
Total Bilirubin: 0.5 mg/dL (ref 0.2–1.2)
Total Protein: 6.7 g/dL (ref 6.0–8.3)

## 2019-02-05 ENCOUNTER — Ambulatory Visit (INDEPENDENT_AMBULATORY_CARE_PROVIDER_SITE_OTHER): Payer: Medicare Other | Admitting: Cardiovascular Disease

## 2019-02-05 DIAGNOSIS — I480 Paroxysmal atrial fibrillation: Secondary | ICD-10-CM

## 2019-02-05 DIAGNOSIS — Z86718 Personal history of other venous thrombosis and embolism: Secondary | ICD-10-CM

## 2019-02-05 DIAGNOSIS — Z7901 Long term (current) use of anticoagulants: Secondary | ICD-10-CM | POA: Diagnosis not present

## 2019-02-05 LAB — POCT INR: INR: 1.7 — AB (ref 2.0–3.0)

## 2019-02-12 ENCOUNTER — Ambulatory Visit (INDEPENDENT_AMBULATORY_CARE_PROVIDER_SITE_OTHER): Payer: Medicare Other | Admitting: Cardiology

## 2019-02-12 DIAGNOSIS — Z86718 Personal history of other venous thrombosis and embolism: Secondary | ICD-10-CM

## 2019-02-12 DIAGNOSIS — Z7901 Long term (current) use of anticoagulants: Secondary | ICD-10-CM | POA: Diagnosis not present

## 2019-02-12 DIAGNOSIS — I480 Paroxysmal atrial fibrillation: Secondary | ICD-10-CM | POA: Diagnosis not present

## 2019-02-12 LAB — POCT INR: INR: 1.7 — AB (ref 2.0–3.0)
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
## 2019-02-19 ENCOUNTER — Encounter: Payer: Self-pay | Admitting: Cardiology

## 2019-02-19 ENCOUNTER — Telehealth (INDEPENDENT_AMBULATORY_CARE_PROVIDER_SITE_OTHER): Payer: Medicare Other | Admitting: Cardiology

## 2019-02-19 ENCOUNTER — Ambulatory Visit (INDEPENDENT_AMBULATORY_CARE_PROVIDER_SITE_OTHER): Payer: Medicare Other | Admitting: Cardiology

## 2019-02-19 VITALS — BP 126/74 | HR 66 | Wt 175.0 lb

## 2019-02-19 DIAGNOSIS — Z7901 Long term (current) use of anticoagulants: Secondary | ICD-10-CM | POA: Diagnosis not present

## 2019-02-19 DIAGNOSIS — I480 Paroxysmal atrial fibrillation: Secondary | ICD-10-CM | POA: Diagnosis not present

## 2019-02-19 DIAGNOSIS — Z86718 Personal history of other venous thrombosis and embolism: Secondary | ICD-10-CM

## 2019-02-19 DIAGNOSIS — Z952 Presence of prosthetic heart valve: Secondary | ICD-10-CM | POA: Diagnosis not present

## 2019-02-19 LAB — POCT INR: INR: 1.7 — AB (ref 2.0–3.0)

## 2019-02-19 MED ORDER — METOPROLOL TARTRATE 25 MG PO TABS: 12.5000 mg | ORAL_TABLET | Freq: Two times a day (BID) | ORAL | 3 refills | Status: DC

## 2019-02-19 NOTE — Patient Instructions (Signed)
Medication Instructions:  No changes *If you need a refill on your cardiac medications before your next appointment, please call your pharmacy*  Lab Work: None  Testing/Procedures: None  Follow-Up: At [facility], you and your health needs are our priority.  As part of our continuing mission to provide you with exceptional heart care, we have created designated Provider Care Teams.  These Care Teams include your primary Cardiologist (physician) and Advanced Practice Providers (APPs -  Physician Assistants and Nurse Practitioners) who all work together to provide you with the care you need, when you need it.  Your next appointment:   6 month(s)  The format for your next appointment:   Either In Person or Virtual  Provider:   Minus Breeding, MD

## 2019-02-25 ENCOUNTER — Telehealth: Payer: Self-pay | Admitting: Cardiology

## 2019-02-25 ENCOUNTER — Telehealth (INDEPENDENT_AMBULATORY_CARE_PROVIDER_SITE_OTHER): Payer: Medicare Other | Admitting: Gastroenterology

## 2019-02-25 ENCOUNTER — Other Ambulatory Visit: Payer: Self-pay

## 2019-02-25 DIAGNOSIS — K8050 Calculus of bile duct without cholangitis or cholecystitis without obstruction: Secondary | ICD-10-CM

## 2019-02-25 NOTE — Patient Instructions (Signed)
If you are age 81 or older, your body mass index should be between 23-30. Your Body mass index is 29.12 kg/m. If this is out of the aforementioned range listed, please consider follow up with your Primary Care Provider.  If you are age 26 or younger, your body mass index should be between 19-25. Your Body mass index is 29.12 kg/m. If this is out of the aformentioned range listed, please consider follow up with your Primary Care Provider.   Follow up as needed.   Thank you,  Dr. Jackquline Denmark
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
## 2019-02-25 NOTE — Telephone Encounter (Signed)
  Patient would like to know if she can do her appointment on 03/03/19 with Dr Curt Bears as a virtual appointment instead of coming in. Please advise

## 2019-02-26 ENCOUNTER — Ambulatory Visit (INDEPENDENT_AMBULATORY_CARE_PROVIDER_SITE_OTHER): Payer: Medicare Other | Admitting: Pharmacist Clinician (PhC)/ Clinical Pharmacy Specialist

## 2019-02-26 DIAGNOSIS — I480 Paroxysmal atrial fibrillation: Secondary | ICD-10-CM | POA: Diagnosis not present

## 2019-02-26 DIAGNOSIS — Z86718 Personal history of other venous thrombosis and embolism: Secondary | ICD-10-CM | POA: Diagnosis not present

## 2019-02-26 DIAGNOSIS — Z7901 Long term (current) use of anticoagulants: Secondary | ICD-10-CM

## 2019-02-26 LAB — POCT INR: INR: 2.3 (ref 2.0–3.0)

## 2019-02-26 NOTE — Telephone Encounter (Signed)
OK for virtual appointment.

## 2019-02-26 NOTE — Telephone Encounter (Signed)
I spoke with patient and changed visit to virtual. Cell phone with video capability--[phone]

## 2019-02-27 DIAGNOSIS — Z23 Encounter for immunization: Secondary | ICD-10-CM | POA: Diagnosis not present

## 2019-03-02 ENCOUNTER — Other Ambulatory Visit: Payer: Self-pay | Admitting: Family Medicine

## 2019-03-03 ENCOUNTER — Encounter: Payer: Self-pay | Admitting: Cardiology

## 2019-03-03 ENCOUNTER — Telehealth (INDEPENDENT_AMBULATORY_CARE_PROVIDER_SITE_OTHER): Payer: Medicare Other | Admitting: Cardiology

## 2019-03-03 ENCOUNTER — Other Ambulatory Visit: Payer: Self-pay

## 2019-03-03 DIAGNOSIS — I480 Paroxysmal atrial fibrillation: Secondary | ICD-10-CM

## 2019-03-03 NOTE — Patient Instructions (Signed)
Medication Instructions:  Your physician recommends that you continue on your current medications as directed. Please refer to the Current Medication list given to you today.  * If you need a refill on your cardiac medications before your next appointment, please call your pharmacy.   Labwork: None ordered If you have labs (blood work) drawn today and your tests are completely normal, you will receive your results only by:  [place] (if you have MyChart) OR  A paper copy in the mail If you have any lab test that is abnormal or we need to change your treatment, we will call you to review the results.  Testing/Procedures: None ordered  Follow-Up: At [facility], you and your health needs are our priority.  As part of our continuing mission to provide you with exceptional heart care, we have created designated Provider Care Teams.  These Care Teams include your primary Cardiologist (physician) and Advanced Practice Providers (APPs -  Physician Assistants and Nurse Practitioners) who all work together to provide you with the care you need, when you need it.  You will need a follow up appointment in 6 months.  Please call our office 2 months in advance to schedule this appointment.  You may see Dr Curt Bears or one of the following Advanced Practice Providers on your designated Care Team:    Chanetta Marshall, NP  Tommye Standard, PA-C  Oda Kilts, [place]  Thank you for choosing [facility]!!   Trinidad Curet, RN [phone]  Any Other Special Instructions Will Be Listed Below (If Applicable).
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
## 2019-03-05 ENCOUNTER — Ambulatory Visit (INDEPENDENT_AMBULATORY_CARE_PROVIDER_SITE_OTHER): Payer: Medicare Other | Admitting: Cardiology

## 2019-03-05 DIAGNOSIS — I480 Paroxysmal atrial fibrillation: Secondary | ICD-10-CM | POA: Diagnosis not present

## 2019-03-05 DIAGNOSIS — Z86718 Personal history of other venous thrombosis and embolism: Secondary | ICD-10-CM

## 2019-03-05 DIAGNOSIS — Z7901 Long term (current) use of anticoagulants: Secondary | ICD-10-CM

## 2019-03-05 LAB — POCT INR: INR: 2.6 (ref 2.0–3.0)

## 2019-03-10 ENCOUNTER — Ambulatory Visit (INDEPENDENT_AMBULATORY_CARE_PROVIDER_SITE_OTHER): Payer: Medicare Other | Admitting: Family Medicine

## 2019-03-10 ENCOUNTER — Other Ambulatory Visit: Payer: Self-pay

## 2019-03-10 ENCOUNTER — Encounter: Payer: Self-pay | Admitting: Family Medicine

## 2019-03-10 DIAGNOSIS — I480 Paroxysmal atrial fibrillation: Secondary | ICD-10-CM | POA: Diagnosis not present

## 2019-03-10 DIAGNOSIS — E785 Hyperlipidemia, unspecified: Secondary | ICD-10-CM | POA: Diagnosis not present

## 2019-03-10 DIAGNOSIS — I10 Essential (primary) hypertension: Secondary | ICD-10-CM | POA: Diagnosis not present

## 2019-03-10 DIAGNOSIS — I350 Nonrheumatic aortic (valve) stenosis: Secondary | ICD-10-CM

## 2019-03-10 DIAGNOSIS — I5032 Chronic diastolic (congestive) heart failure: Secondary | ICD-10-CM | POA: Diagnosis not present

## 2019-03-10 NOTE — Assessment & Plan Note (Signed)
Still struggles with some sob and disequilibrium at times. Discussed possibility of referral to pulmonology to rule out a pulmonary diagnosis being contributory. For now she declines as she feels her symptoms are stable. Denies CP/palp/HA/congestion/fevers/GI or GU c/o. Taking meds as prescribed

## 2019-03-10 NOTE — Assessment & Plan Note (Signed)
Tolerating statin, encouraged heart healthy diet, avoid trans fats, minimize simple carbs and saturated fats. Increase exercise as tolerated 

## 2019-03-10 NOTE — Assessment & Plan Note (Signed)
Is now following with cardiology and no recent exacerbation

## 2019-03-10 NOTE — Assessment & Plan Note (Signed)
Well controlled, no changes to meds. Encouraged heart healthy diet such as the DASH diet and exercise as tolerated.  °
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
## 2019-03-10 NOTE — Progress Notes (Signed)
SOB at times, dizzy at times, weak at times. Not everyday

## 2019-03-10 NOTE — Assessment & Plan Note (Signed)
She has a [employer] and she has been downloading when she feels palpitations or is worried she is in Afib but she generally has not been. She will continue to follow with cardiology and still send strips if something is different.

## 2019-03-12 ENCOUNTER — Ambulatory Visit (INDEPENDENT_AMBULATORY_CARE_PROVIDER_SITE_OTHER): Payer: Medicare Other | Admitting: Cardiology

## 2019-03-12 DIAGNOSIS — Z7901 Long term (current) use of anticoagulants: Secondary | ICD-10-CM

## 2019-03-12 DIAGNOSIS — I480 Paroxysmal atrial fibrillation: Secondary | ICD-10-CM

## 2019-03-12 DIAGNOSIS — Z86718 Personal history of other venous thrombosis and embolism: Secondary | ICD-10-CM | POA: Diagnosis not present

## 2019-03-12 LAB — POCT INR: INR: 2.4 (ref 2.0–3.0)

## 2019-03-19 ENCOUNTER — Ambulatory Visit (INDEPENDENT_AMBULATORY_CARE_PROVIDER_SITE_OTHER): Payer: Medicare Other | Admitting: Cardiovascular Disease

## 2019-03-19 DIAGNOSIS — Z86718 Personal history of other venous thrombosis and embolism: Secondary | ICD-10-CM

## 2019-03-19 DIAGNOSIS — Z7901 Long term (current) use of anticoagulants: Secondary | ICD-10-CM | POA: Diagnosis not present

## 2019-03-19 DIAGNOSIS — I480 Paroxysmal atrial fibrillation: Secondary | ICD-10-CM

## 2019-03-19 LAB — POCT INR: INR: 2.4 (ref 2.0–3.0)

## 2019-03-20 DIAGNOSIS — Z23 Encounter for immunization: Secondary | ICD-10-CM | POA: Diagnosis not present

## 2019-03-24 NOTE — Telephone Encounter (Signed)
Followed up with pt per Dr. Curt Bears request. She reports doing well.  No issues to report.  The only thing to reports is she did take an extra Metoprolol (12.5 mg extra)(Per pt:  Dr. Percival Spanish said I could if had AFib issues) yesterday and w/i 30 min her HR returned to normal. She reports this is the first incident she has had in a long time, she states it was "months and months ago last issue". She is not interested in taking more medications at this time unless absolutely necessary.  Pt aware I will discuss this w/ Camnitz and let her know further recommendation/s. Marland KitchenPatient verbalized understanding and agreeable to plan.

## 2019-03-26 ENCOUNTER — Encounter: Payer: Self-pay | Admitting: Cardiology

## 2019-03-26 ENCOUNTER — Ambulatory Visit (INDEPENDENT_AMBULATORY_CARE_PROVIDER_SITE_OTHER): Payer: Medicare Other | Admitting: Pharmacist

## 2019-03-26 DIAGNOSIS — Z86718 Personal history of other venous thrombosis and embolism: Secondary | ICD-10-CM

## 2019-03-26 DIAGNOSIS — Z7901 Long term (current) use of anticoagulants: Secondary | ICD-10-CM | POA: Diagnosis not present

## 2019-03-26 DIAGNOSIS — I480 Paroxysmal atrial fibrillation: Secondary | ICD-10-CM

## 2019-03-26 LAB — POCT INR
INR: 2.4 (ref 2.0–3.0)
INR: 2.4 (ref 2.0–3.0)

## 2019-03-26 NOTE — Progress Notes (Signed)
This encounter was created in error - please disregard.

## 2019-04-02 ENCOUNTER — Ambulatory Visit (INDEPENDENT_AMBULATORY_CARE_PROVIDER_SITE_OTHER): Payer: Medicare Other | Admitting: Pharmacist Clinician (PhC)/ Clinical Pharmacy Specialist

## 2019-04-02 DIAGNOSIS — I480 Paroxysmal atrial fibrillation: Secondary | ICD-10-CM

## 2019-04-02 DIAGNOSIS — Z86718 Personal history of other venous thrombosis and embolism: Secondary | ICD-10-CM

## 2019-04-02 DIAGNOSIS — Z7901 Long term (current) use of anticoagulants: Secondary | ICD-10-CM | POA: Diagnosis not present

## 2019-04-02 LAB — POCT INR: INR: 2.1 (ref 2.0–3.0)

## 2019-04-04 ENCOUNTER — Other Ambulatory Visit: Payer: Self-pay | Admitting: Cardiology

## 2019-04-09 ENCOUNTER — Telehealth: Payer: Self-pay | Admitting: Family Medicine

## 2019-04-09 ENCOUNTER — Ambulatory Visit (INDEPENDENT_AMBULATORY_CARE_PROVIDER_SITE_OTHER): Payer: Medicare Other | Admitting: Internal Medicine

## 2019-04-09 DIAGNOSIS — Z7901 Long term (current) use of anticoagulants: Secondary | ICD-10-CM

## 2019-04-09 DIAGNOSIS — Z86718 Personal history of other venous thrombosis and embolism: Secondary | ICD-10-CM | POA: Diagnosis not present

## 2019-04-09 DIAGNOSIS — I480 Paroxysmal atrial fibrillation: Secondary | ICD-10-CM

## 2019-04-09 LAB — POCT INR: INR: 2 (ref 2.0–3.0)

## 2019-04-09 NOTE — Telephone Encounter (Signed)
Just Fyi   Patient would like Dr Charlett Blake to know that she got her covid vaccines   1st Vaccine given on Date : 02/27/2019 2nd Vaccines given on Date : 03/20/2019  Pfrizer Vaccine, Given by [facility].

## 2019-04-10 NOTE — Telephone Encounter (Signed)
Noted, added to there chart

## 2019-04-16 ENCOUNTER — Ambulatory Visit (INDEPENDENT_AMBULATORY_CARE_PROVIDER_SITE_OTHER): Payer: Medicare Other | Admitting: Cardiovascular Disease

## 2019-04-16 DIAGNOSIS — Z7901 Long term (current) use of anticoagulants: Secondary | ICD-10-CM | POA: Diagnosis not present

## 2019-04-16 DIAGNOSIS — I480 Paroxysmal atrial fibrillation: Secondary | ICD-10-CM

## 2019-04-16 DIAGNOSIS — Z86718 Personal history of other venous thrombosis and embolism: Secondary | ICD-10-CM | POA: Diagnosis not present

## 2019-04-16 LAB — POCT INR: INR: 2.6 (ref 2.0–3.0)

## 2019-04-23 ENCOUNTER — Ambulatory Visit (INDEPENDENT_AMBULATORY_CARE_PROVIDER_SITE_OTHER): Payer: Medicare Other | Admitting: Cardiovascular Disease

## 2019-04-23 DIAGNOSIS — Z7901 Long term (current) use of anticoagulants: Secondary | ICD-10-CM

## 2019-04-23 DIAGNOSIS — I480 Paroxysmal atrial fibrillation: Secondary | ICD-10-CM | POA: Diagnosis not present

## 2019-04-23 DIAGNOSIS — Z86718 Personal history of other venous thrombosis and embolism: Secondary | ICD-10-CM | POA: Diagnosis not present

## 2019-04-23 LAB — POCT INR: INR: 2.2 (ref 2.0–3.0)

## 2019-04-30 ENCOUNTER — Ambulatory Visit (INDEPENDENT_AMBULATORY_CARE_PROVIDER_SITE_OTHER): Payer: Medicare Other | Admitting: Cardiology

## 2019-04-30 DIAGNOSIS — Z86718 Personal history of other venous thrombosis and embolism: Secondary | ICD-10-CM | POA: Diagnosis not present

## 2019-04-30 DIAGNOSIS — Z7901 Long term (current) use of anticoagulants: Secondary | ICD-10-CM | POA: Diagnosis not present

## 2019-04-30 DIAGNOSIS — I480 Paroxysmal atrial fibrillation: Secondary | ICD-10-CM | POA: Diagnosis not present

## 2019-04-30 LAB — POCT INR: INR: 2.2 (ref 2.0–3.0)

## 2019-05-02 NOTE — Telephone Encounter (Signed)
Pt reports doing fine. No more episodes since those readings sent in several weeks ago. She does states she knows when she is in AFib vs NSR.  Pt will continue to monitor and notify us if she experiences further episodes and/or experiences any symptoms.

## 2019-05-07 ENCOUNTER — Ambulatory Visit (INDEPENDENT_AMBULATORY_CARE_PROVIDER_SITE_OTHER): Payer: Medicare Other | Admitting: Internal Medicine

## 2019-05-07 DIAGNOSIS — I480 Paroxysmal atrial fibrillation: Secondary | ICD-10-CM

## 2019-05-07 DIAGNOSIS — Z86718 Personal history of other venous thrombosis and embolism: Secondary | ICD-10-CM | POA: Diagnosis not present

## 2019-05-07 DIAGNOSIS — Z7901 Long term (current) use of anticoagulants: Secondary | ICD-10-CM

## 2019-05-07 LAB — POCT INR: INR: 2 (ref 2.0–3.0)

## 2019-05-08 DIAGNOSIS — I671 Cerebral aneurysm, nonruptured: Secondary | ICD-10-CM | POA: Diagnosis not present

## 2019-05-08 DIAGNOSIS — R519 Headache, unspecified: Secondary | ICD-10-CM | POA: Diagnosis not present

## 2019-05-14 ENCOUNTER — Ambulatory Visit (INDEPENDENT_AMBULATORY_CARE_PROVIDER_SITE_OTHER): Payer: Medicare Other | Admitting: Pharmacist Clinician (PhC)/ Clinical Pharmacy Specialist

## 2019-05-14 DIAGNOSIS — Z86718 Personal history of other venous thrombosis and embolism: Secondary | ICD-10-CM

## 2019-05-14 DIAGNOSIS — Z7901 Long term (current) use of anticoagulants: Secondary | ICD-10-CM

## 2019-05-14 DIAGNOSIS — I480 Paroxysmal atrial fibrillation: Secondary | ICD-10-CM

## 2019-05-14 LAB — POCT INR: INR: 1.4 — AB (ref 2.0–3.0)

## 2019-05-15 ENCOUNTER — Telehealth: Payer: Self-pay | Admitting: Cardiology

## 2019-05-15 NOTE — Telephone Encounter (Signed)
I spoke to the patient who feels like she is in A fib with a HR over 100.  She has taken multiple doses of Metoprolol this morning and so far has not reduced the HR.  Her BP is 123/93 and her only symptom is SOB.  I told her to not take any more Metoprolol and let it take action.    She would like a Virtual visit with Dr Curt Bears next week.  She knows to go to the ED, if symptoms worsen.

## 2019-05-15 NOTE — Telephone Encounter (Signed)
Followed up w/ pt Stressed of not taking anymore extra Metoprolol today as previously advised (she took 4 extras this morning over the course of 4 hours) She is back in NSR. Her dtr is at home with her in case she experiences any SE from extra Metoprolol. Pt advised that in-person visit w/ an EKG would be preferable.  Explained that we can get ekg and review kardia readings sent in to determine best course of action. Pt agreeable.  Scheduled to see Oda Kilts, PA on Monday.  She is aware that Dr. Curt Bears will be in the office that day but has no openings on his schedule.

## 2019-05-15 NOTE — Telephone Encounter (Signed)
New Message  Patient c/o Palpitations:  High priority if patient c/o lightheadedness, shortness of breath, or chest pain  1) How long have you had palpitations/irregular HR/ Afib? Are you having the symptoms now? Yes  2) Are you currently experiencing lightheadedness, SOB or CP? SOB  3) Do you have a history of afib (atrial fibrillation) or irregular heart rhythm? Yes  4) Have you checked your BP or HR? (document readings if available): HR 130  5) Are you experiencing any other symptoms? Racing heart beat and SOB (afib symptoms per patient)

## 2019-05-19 ENCOUNTER — Encounter: Payer: Self-pay | Admitting: Student

## 2019-05-19 ENCOUNTER — Other Ambulatory Visit: Payer: Self-pay

## 2019-05-19 ENCOUNTER — Ambulatory Visit (INDEPENDENT_AMBULATORY_CARE_PROVIDER_SITE_OTHER): Payer: Medicare Other | Admitting: Student

## 2019-05-19 VITALS — BP 140/60 | HR 58 | Ht 65.0 in | Wt 177.1 lb

## 2019-05-19 DIAGNOSIS — I5032 Chronic diastolic (congestive) heart failure: Secondary | ICD-10-CM

## 2019-05-19 DIAGNOSIS — R001 Bradycardia, unspecified: Secondary | ICD-10-CM

## 2019-05-19 DIAGNOSIS — I10 Essential (primary) hypertension: Secondary | ICD-10-CM

## 2019-05-19 DIAGNOSIS — I480 Paroxysmal atrial fibrillation: Secondary | ICD-10-CM
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
## 2019-05-19 NOTE — Patient Instructions (Addendum)
Medication Instructions:  Metoprolol Tartrate 12.5 mg 1/2 tablet in AM and 25 mg 1 tablet in the PM.   *If you need a refill on your cardiac medications before your next appointment, please call your pharmacy*   Lab Work:  TODAY BMET CBC TSH If you have labs (blood work) drawn today and your tests are completely normal, you will receive your results only by: Marland Kitchen MyChart Message (if you have MyChart) OR . A paper copy in the mail If you have any lab test that is abnormal or we need to change your treatment, we will call you to review the results.   Testing/Procedures: none   Follow-Up: At [facility], you and your health needs are our priority.  As part of our continuing mission to provide you with exceptional heart care, we have created designated Provider Care Teams.  These Care Teams include your primary Cardiologist (physician) and Advanced Practice Providers (APPs -  Physician Assistants and Nurse Practitioners) who all work together to provide you with the care you need, when you need it.   Your next appointment:   6 WEEKS  The format for your next appointment:   In Person  Provider:   Oda Kilts, PA   Other Instructions

## 2019-05-20 ENCOUNTER — Other Ambulatory Visit: Payer: Self-pay

## 2019-05-20 DIAGNOSIS — I480 Paroxysmal atrial fibrillation: Secondary | ICD-10-CM

## 2019-05-20 LAB — BASIC METABOLIC PANEL WITH GFR
BUN/Creatinine Ratio: 27 (ref 12–28)
BUN: 17 mg/dL (ref 8–27)
CO2: 22 mmol/L (ref 20–29)
Calcium: 9.4 mg/dL (ref 8.7–10.3)
Chloride: 105 mmol/L (ref 96–106)
Creatinine, Ser: 0.62 mg/dL (ref 0.57–1.00)
GFR calc Af Amer: 98 mL/min/1.73 (ref >59)
GFR calc non Af Amer: 85 mL/min/1.73 (ref >59)
Glucose: 83 mg/dL (ref 65–99)
Potassium: 4.4 mmol/L (ref 3.5–5.2)
Sodium: 147 mmol/L — ABNORMAL HIGH (ref 134–144)

## 2019-05-20 LAB — CBC
Hematocrit: 39.2 % (ref 34.0–46.6)
Hemoglobin: 13.1 g/dL (ref 11.1–15.9)
MCH: 30.5 pg (ref 26.6–33.0)
MCHC: 33.4 g/dL (ref 31.5–35.7)
MCV: 91 fL (ref 79–97)
Platelets: 202 10*3/uL (ref 150–450)
RBC: 4.29 x10E6/uL (ref 3.77–5.28)
RDW: 14.2 % (ref 11.7–15.4)
WBC: 7.5 10*3/uL (ref 3.4–10.8)

## 2019-05-20 LAB — TSH: TSH: 2.15 u[IU]/mL (ref 0.450–4.500)

## 2019-05-20 MED ORDER — DRONEDARONE HCL 400 MG PO TABS: 400.0000 mg | ORAL_TABLET | Freq: Two times a day (BID) | ORAL | 0 refills | Status: DC

## 2019-05-21 ENCOUNTER — Ambulatory Visit (INDEPENDENT_AMBULATORY_CARE_PROVIDER_SITE_OTHER): Payer: Medicare Other | Admitting: Cardiovascular Disease

## 2019-05-21 DIAGNOSIS — Z7901 Long term (current) use of anticoagulants: Secondary | ICD-10-CM

## 2019-05-21 DIAGNOSIS — Z86718 Personal history of other venous thrombosis and embolism: Secondary | ICD-10-CM | POA: Diagnosis not present

## 2019-05-21 DIAGNOSIS — I480 Paroxysmal atrial fibrillation: Secondary | ICD-10-CM

## 2019-05-21 LAB — POCT INR: INR: 2.3 (ref 2.0–3.0)

## 2019-05-27 ENCOUNTER — Telehealth: Payer: Self-pay | Admitting: Student

## 2019-05-27 NOTE — Telephone Encounter (Signed)
Patient called and wanted to know about her recent lab results. She also was told that someone from [place] office was going to let her know how much a new medication was going to cost her with her insurance, but she has not heard about that either.

## 2019-05-27 NOTE — Telephone Encounter (Signed)
I spoke with patient and reviewed recent lab results with her.  Multaq is listed on medication list but with note to pharmacy for price check only. I called pharmacy and they have not received this.They need prescription sent before they can quote insurance price. Will check with Oda Kilts, PA to see if prescription should be sent to pharmacy.

## 2019-05-28 ENCOUNTER — Ambulatory Visit (INDEPENDENT_AMBULATORY_CARE_PROVIDER_SITE_OTHER): Payer: Medicare Other | Admitting: Cardiology

## 2019-05-28 DIAGNOSIS — Z86718 Personal history of other venous thrombosis and embolism: Secondary | ICD-10-CM

## 2019-05-28 DIAGNOSIS — I480 Paroxysmal atrial fibrillation: Secondary | ICD-10-CM

## 2019-05-28 DIAGNOSIS — Z7901 Long term (current) use of anticoagulants: Secondary | ICD-10-CM | POA: Diagnosis not present

## 2019-05-28 DIAGNOSIS — I671 Cerebral aneurysm, nonruptured: Secondary | ICD-10-CM | POA: Diagnosis not present

## 2019-05-28 LAB — POCT INR: INR: 1.8 — AB (ref 2.0–3.0)

## 2019-05-28 MED ORDER — DRONEDARONE HCL 400 MG PO TABS: 400.0000 mg | ORAL_TABLET | Freq: Two times a day (BID) | ORAL | 0 refills | Status: DC

## 2019-05-28 NOTE — Telephone Encounter (Signed)
  Yes, please send prescription to pharmacy; as long as patient knows not to pick it up at this time!  Thank you  Lollie Marrow, [place]  05/28/2019 8:53 AM

## 2019-05-28 NOTE — Telephone Encounter (Signed)
Prescription sent to pharmacy with note for price check only. I spoke with pharmacist at [facility]. Prescription has been received but is not on patient's formulary.  Will probably need prior auth.  Rejection letter to be faxed to our office. Pharmacist at [facility] is aware not to fill prescription at this time

## 2019-05-30 ENCOUNTER — Telehealth: Payer: Self-pay

## 2019-05-30 NOTE — Telephone Encounter (Signed)
**Note De-Identified  Obfuscation** I started a non-formulary Multaq PA through covermymeds as received the following message: [name] Key: [facility] - PA Case ID: [id number]  Outcome  Approved today  Case Id: [id number] Prior Auth;Coverage Start Date:04/30/2019;Coverage End Date:05/29/2020 Drug Multaq 400MG  tablets  Form[employer] Scripts Electronic PA Form (2017 NCPDP  Per message from [facility] they are notifying Mikayla [name] of this approval.

## 2019-06-04 ENCOUNTER — Ambulatory Visit (INDEPENDENT_AMBULATORY_CARE_PROVIDER_SITE_OTHER): Payer: Medicare Other | Admitting: Pharmacist Clinician (PhC)/ Clinical Pharmacy Specialist

## 2019-06-04 DIAGNOSIS — Z7901 Long term (current) use of anticoagulants: Secondary | ICD-10-CM | POA: Diagnosis not present

## 2019-06-04 DIAGNOSIS — I480 Paroxysmal atrial fibrillation: Secondary | ICD-10-CM

## 2019-06-04 DIAGNOSIS — Z86718 Personal history of other venous thrombosis and embolism: Secondary | ICD-10-CM | POA: Diagnosis not present

## 2019-06-04 LAB — POCT INR: INR: 2.1 (ref 2.0–3.0)

## 2019-06-11 ENCOUNTER — Ambulatory Visit (INDEPENDENT_AMBULATORY_CARE_PROVIDER_SITE_OTHER): Payer: Medicare Other | Admitting: Pharmacist Clinician (PhC)/ Clinical Pharmacy Specialist

## 2019-06-11 DIAGNOSIS — Z7901 Long term (current) use of anticoagulants: Secondary | ICD-10-CM

## 2019-06-11 DIAGNOSIS — Z86718 Personal history of other venous thrombosis and embolism: Secondary | ICD-10-CM

## 2019-06-11 DIAGNOSIS — I480 Paroxysmal atrial fibrillation: Secondary | ICD-10-CM | POA: Diagnosis not present

## 2019-06-11 LAB — POCT INR: INR: 2.2 (ref 2.0–3.0)

## 2019-06-17 ENCOUNTER — Other Ambulatory Visit: Payer: Self-pay | Admitting: Cardiology

## 2019-06-18 ENCOUNTER — Ambulatory Visit (INDEPENDENT_AMBULATORY_CARE_PROVIDER_SITE_OTHER): Payer: Medicare Other | Admitting: Cardiovascular Disease

## 2019-06-18 DIAGNOSIS — Z7901 Long term (current) use of anticoagulants: Secondary | ICD-10-CM

## 2019-06-18 DIAGNOSIS — Z86718 Personal history of other venous thrombosis and embolism: Secondary | ICD-10-CM

## 2019-06-18 DIAGNOSIS — I480 Paroxysmal atrial fibrillation: Secondary | ICD-10-CM | POA: Diagnosis not present

## 2019-06-18 LAB — POCT INR: INR: 2.2 (ref 2.0–3.0)

## 2019-06-19 NOTE — Telephone Encounter (Signed)
Followed up with pt per Dr. Curt Bears request (she may need a medication change if symptomatic) Pt reports she sends these reports b/c she wants [place] to know how frequently she is experiencing AFib.  She does report SOB/racing heart with episodes.  She also reports ongoing dizziness, but it can also occur when "her heart is not racing".  (I don't know that she reported this at last OV on 4/12)  She tells me that Multaq will cost her over $300.00/month (per inquiry after OV w/ Oda Kilts on 4/12) - she canNOT afford this. Pt has scheduled f/u w/ Jonni Sanger on 6/1.  Made aware that I would send this information to [facility] to review/advise.  Aware that we may try for Multaq cost assistance prior to appt f/u in case she needs to start it. Pt is agreeable.

## 2019-06-20 ENCOUNTER — Telehealth: Payer: Self-pay

## 2019-06-20 MED ORDER — METOPROLOL TARTRATE 25 MG PO TABS: 25.0000 mg | ORAL_TABLET | Freq: Two times a day (BID) | ORAL | 3 refills | Status: DC

## 2019-06-20 NOTE — Telephone Encounter (Signed)
I tried calling patient, but call could not go through at this time.  Will try later. 5/14

## 2019-06-20 NOTE — Telephone Encounter (Signed)
lpmtcb 5/14

## 2019-06-20 NOTE — Telephone Encounter (Signed)
I spoke to the patient who was taking Metoprolol Tartrate as previously prescribed at last OV with Jonni Sanger.  She will increase to 25 mg bid and monitor HR/BP.

## 2019-06-25 ENCOUNTER — Encounter: Payer: Self-pay | Admitting: Pharmacist

## 2019-06-25 ENCOUNTER — Ambulatory Visit (INDEPENDENT_AMBULATORY_CARE_PROVIDER_SITE_OTHER): Payer: Medicare Other | Admitting: Cardiology

## 2019-06-25 DIAGNOSIS — I480 Paroxysmal atrial fibrillation: Secondary | ICD-10-CM | POA: Diagnosis not present

## 2019-06-25 DIAGNOSIS — Z7901 Long term (current) use of anticoagulants: Secondary | ICD-10-CM | POA: Diagnosis not present

## 2019-06-25 DIAGNOSIS — Z86718 Personal history of other venous thrombosis and embolism: Secondary | ICD-10-CM | POA: Diagnosis not present

## 2019-06-25 LAB — POCT INR: INR: 2.2 (ref 2.0–3.0)

## 2019-06-25 NOTE — Telephone Encounter (Signed)
This encounter was created in error - please disregard.

## 2019-06-25 NOTE — Patient Instructions (Signed)
Description   Continue with 1 tablet daily except 1/2 tablet each Monday, Wednesday and Friday.  Repeat INR in 1 week - per insurance request.   (dose is per pt - we had MWF at 1/2 tablet)

## 2019-07-02 LAB — POCT INR: INR: 2.2 (ref 2.0–3.0)

## 2019-07-04 ENCOUNTER — Ambulatory Visit (INDEPENDENT_AMBULATORY_CARE_PROVIDER_SITE_OTHER): Payer: Medicare Other | Admitting: Pharmacist

## 2019-07-04 DIAGNOSIS — I480 Paroxysmal atrial fibrillation: Secondary | ICD-10-CM

## 2019-07-04 DIAGNOSIS — Z7901 Long term (current) use of anticoagulants: Secondary | ICD-10-CM

## 2019-07-04 DIAGNOSIS — Z86718 Personal history of other venous thrombosis and embolism: Secondary | ICD-10-CM

## 2019-07-08 ENCOUNTER — Encounter: Payer: Self-pay | Admitting: Student

## 2019-07-08 ENCOUNTER — Other Ambulatory Visit: Payer: Self-pay

## 2019-07-08 ENCOUNTER — Ambulatory Visit (INDEPENDENT_AMBULATORY_CARE_PROVIDER_SITE_OTHER): Payer: Medicare Other | Admitting: Student

## 2019-07-08 ENCOUNTER — Telehealth: Payer: Self-pay | Admitting: Student

## 2019-07-08 VITALS — BP 126/68 | HR 53 | Ht 65.0 in | Wt 184.0 lb

## 2019-07-08 DIAGNOSIS — I5032 Chronic diastolic (congestive) heart failure: Secondary | ICD-10-CM | POA: Diagnosis not present

## 2019-07-08 DIAGNOSIS — I480 Paroxysmal atrial fibrillation: Secondary | ICD-10-CM | POA: Diagnosis not present

## 2019-07-08 DIAGNOSIS — I10 Essential (primary) hypertension: Secondary | ICD-10-CM

## 2019-07-08 DIAGNOSIS — Z952 Presence of prosthetic heart valve: Secondary | ICD-10-CM | POA: Diagnosis not present

## 2019-07-08 MED ORDER — DILTIAZEM HCL 30 MG PO TABS: 30.0000 mg | ORAL_TABLET | ORAL | 3 refills | Status: DC | PRN

## 2019-07-08 NOTE — Telephone Encounter (Signed)
I spoke to the patient and allowed her daughter to accompany her to the appointment, because of mobility/memory issues.

## 2019-07-08 NOTE — Telephone Encounter (Signed)
Mikayla [name] is calling requesting her daughter attend her appointment scheduled for today at 11:45 AM to assist her getting in and out of the building as well as making sure she remembers all the information discussed at the appointment. Please advise.
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
## 2019-07-08 NOTE — Patient Instructions (Addendum)
Medication Instructions:   TAKE DILTIAZEM 30 mg tablet as needed for heart rhythm *If you need a refill on your cardiac medications before your next appointment, please call your pharmacy*   Lab Work: none If you have labs (blood work) drawn today and your tests are completely normal, you will receive your results only by:  [place] (if you have MyChart) OR  A paper copy in the mail If you have any lab test that is abnormal or we need to change your treatment, we will call you to review the results.   Testing/Procedures: none   Follow-Up: 3 months with Dr Curt Bears [facility], you and your health needs are our priority.  As part of our continuing mission to provide you with exceptional heart care, we have created designated Provider Care Teams.  These Care Teams include your primary Cardiologist (physician) and Advanced Practice Providers (APPs -  Physician Assistants and Nurse Practitioners) who all work together to provide you with the care you need, when you need it.   Other Instructions  Diltiazem Oral Tablets What is this medicine? DILTIAZEM (dil TYE a zem) is a calcium channel blocker. It relaxes your blood vessels and decreases the amount of work the heart has to do. It treats and/or prevents chest pain (also called angina). This medicine may be used for other purposes; ask your health care provider or pharmacist if you have questions. COMMON BRAND NAME(S): Cardizem What should I tell my health care provider before I take this medicine? They need to know if you have any of these conditions:  heart attack  heart disease  irregular heartbeat or rhythm  low blood pressure  an unusual or allergic reaction to diltiazem, other drugs, foods, dyes, or preservatives  pregnant or trying to get pregnant  breast-feeding How should I use this medicine? Take this drug by mouth. Take it as directed on the prescription label at the same time every day. Keep taking it  unless your health care provider tells you to stop. Talk to your health care provider about the use of this drug in children. Special care may be needed. Overdosage: If you think you have taken too much of this medicine contact a poison control center or emergency room at once. NOTE: This medicine is only for you. Do not share this medicine with others. What if I miss a dose? If you miss a dose, take it as soon as you can. If it is almost time for your next dose, take only that dose. Do not take double or extra doses. What may interact with this medicine? Do not take this medicine with any of the following:  cisapride  hawthorn  pimozide  ranolazine  red yeast rice This medicine may also interact with the following medications:  buspirone  carbamazepine  cimetidine  cyclosporine  digoxin  local anesthetics or general anesthetics  lovastatin  medicines for anxiety or difficulty sleeping like midazolam and triazolam  medicines for high blood pressure or heart problems  quinidine  rifampin, rifabutin, or rifapentine This list may not describe all possible interactions. Give your health care provider a list of all the medicines, herbs, non-prescription drugs, or dietary supplements you use. Also tell them if you smoke, drink alcohol, or use illegal drugs. Some items may interact with your medicine. What should I watch for while using this medicine? Visit your health care provider for regular checks on your progress. Check your blood pressure as directed. Ask your health care provider what  your blood pressure should be. Also, find out when you should contact him or her. Do not treat yourself for coughs, colds, or pain while you are using this drug without asking your health care provider for advice. Some drugs may increase your blood pressure. This drug may cause serious skin reactions. They can happen weeks to months after starting the drug. Contact your health care provider  right away if you notice fevers or flu-like symptoms with a rash. The rash may be red or purple and then turn into blisters or peeling of the skin. Or, you might notice a red rash with swelling of the face, lips or lymph nodes in your neck or under your arms. You may get drowsy or dizzy. Do not drive, use machinery, or do anything that needs mental alertness until you know how this drug affects you. Do not stand up or sit up quickly, especially if you are an older patient. This reduces the risk of dizzy or fainting spells. What side effects may I notice from receiving this medicine? Side effects that you should report to your doctor or health care provider as soon as possible:  allergic reactions (skin rash, itching or hives; swelling of the face, lips, or tongue)  heart failure (trouble breathing; fast, irregular heartbeat; sudden weight gain; swelling of the ankles, feet, hands; unusually weak or tired)  heartbeat rhythm changes (trouble breathing; chest pain; dizziness; fast, irregular heartbeat; feeling faint or lightheaded, falls)  liver injury (dark yellow or brown urine; general ill feeling or flu-like symptoms; loss of appetite, right upper belly pain; unusually weak or tired, yellowing of the eyes or skin)  low blood pressure (dizziness; feeling faint or lightheaded, falls; unusually weak or tired)  redness, blistering, peeling, or loosening of the skin, including inside the mouth Side effects that usually do not require medical attention (report to your doctor or health care provider if they continue or are bothersome):  changes in sex drive or performance  depressed mood  headache  sudden weight gain  nausea  sudden weight gain  trouble sleeping This list may not describe all possible side effects. Call your doctor for medical advice about side effects. You may report side effects to FDA at 1-800-FDA-1088. Where should I keep my medicine? Keep out of the reach of children  and pets. Store at room temperature between 15 and 30 degrees C (59 and 86 degrees F). Protect from moisture. Keep the container tightly closed. Throw away any unused drug after the expiration date. NOTE: This sheet is a summary. It may not cover all possible information. If you have questions about this medicine, talk to your doctor, pharmacist, or health care provider.  2020 Elsevier/Gold Standard (2018-10-29 18:13:24)

## 2019-07-09 ENCOUNTER — Ambulatory Visit (INDEPENDENT_AMBULATORY_CARE_PROVIDER_SITE_OTHER): Payer: Medicare Other | Admitting: Cardiology

## 2019-07-09 DIAGNOSIS — I480 Paroxysmal atrial fibrillation: Secondary | ICD-10-CM | POA: Diagnosis not present

## 2019-07-09 DIAGNOSIS — Z7901 Long term (current) use of anticoagulants: Secondary | ICD-10-CM | POA: Diagnosis not present

## 2019-07-09 DIAGNOSIS — H01009 Unspecified blepharitis unspecified eye, unspecified eyelid: Secondary | ICD-10-CM | POA: Insufficient documentation

## 2019-07-09 DIAGNOSIS — H18519 Endothelial corneal dystrophy, unspecified eye: Secondary | ICD-10-CM | POA: Insufficient documentation

## 2019-07-09 DIAGNOSIS — H02886 Meibomian gland dysfunction of left eye, unspecified eyelid: Secondary | ICD-10-CM | POA: Insufficient documentation

## 2019-07-09 DIAGNOSIS — H35033 Hypertensive retinopathy, bilateral: Secondary | ICD-10-CM | POA: Insufficient documentation

## 2019-07-09 DIAGNOSIS — Z86718 Personal history of other venous thrombosis and embolism: Secondary | ICD-10-CM

## 2019-07-09 DIAGNOSIS — H43813 Vitreous degeneration, bilateral: Secondary | ICD-10-CM | POA: Insufficient documentation

## 2019-07-09 DIAGNOSIS — H02883 Meibomian gland dysfunction of right eye, unspecified eyelid: Secondary | ICD-10-CM | POA: Insufficient documentation

## 2019-07-09 DIAGNOSIS — H353131 Nonexudative age-related macular degeneration, bilateral, early dry stage: Secondary | ICD-10-CM | POA: Insufficient documentation

## 2019-07-09 LAB — POCT INR: INR: 1.9 — AB (ref 2.0–3.0)

## 2019-07-16 ENCOUNTER — Ambulatory Visit (INDEPENDENT_AMBULATORY_CARE_PROVIDER_SITE_OTHER): Payer: Medicare Other | Admitting: Pharmacist Clinician (PhC)/ Clinical Pharmacy Specialist

## 2019-07-16 DIAGNOSIS — Z86718 Personal history of other venous thrombosis and embolism: Secondary | ICD-10-CM | POA: Diagnosis not present

## 2019-07-16 DIAGNOSIS — Z7901 Long term (current) use of anticoagulants: Secondary | ICD-10-CM

## 2019-07-16 DIAGNOSIS — I480 Paroxysmal atrial fibrillation: Secondary | ICD-10-CM

## 2019-07-16 LAB — POCT INR: INR: 2 (ref 2.0–3.0)

## 2019-07-23 ENCOUNTER — Ambulatory Visit (INDEPENDENT_AMBULATORY_CARE_PROVIDER_SITE_OTHER): Payer: Medicare Other | Admitting: Cardiovascular Disease

## 2019-07-23 DIAGNOSIS — Z7901 Long term (current) use of anticoagulants: Secondary | ICD-10-CM | POA: Diagnosis not present

## 2019-07-23 DIAGNOSIS — I480 Paroxysmal atrial fibrillation: Secondary | ICD-10-CM | POA: Diagnosis not present

## 2019-07-23 DIAGNOSIS — Z86718 Personal history of other venous thrombosis and embolism: Secondary | ICD-10-CM | POA: Diagnosis not present

## 2019-07-23 LAB — POCT INR: INR: 2.4 (ref 2.0–3.0)

## 2019-07-23 NOTE — Patient Instructions (Signed)
Description   Continue with 1 tablet daily except 1/2 tablet each Monday, Wednesday and Friday.  Repeat INR in 1 week - per insurance request.

## 2019-07-30 ENCOUNTER — Ambulatory Visit (INDEPENDENT_AMBULATORY_CARE_PROVIDER_SITE_OTHER): Payer: Medicare Other | Admitting: Cardiology

## 2019-07-30 DIAGNOSIS — I480 Paroxysmal atrial fibrillation: Secondary | ICD-10-CM | POA: Diagnosis not present

## 2019-07-30 DIAGNOSIS — Z7901 Long term (current) use of anticoagulants: Secondary | ICD-10-CM

## 2019-07-30 DIAGNOSIS — Z86718 Personal history of other venous thrombosis and embolism: Secondary | ICD-10-CM

## 2019-07-30 LAB — POCT INR: INR: 2.2 (ref 2.0–3.0)

## 2019-08-05 ENCOUNTER — Other Ambulatory Visit: Payer: Self-pay

## 2019-08-05 MED ORDER — METOPROLOL TARTRATE 25 MG PO TABS: 25.0000 mg | ORAL_TABLET | Freq: Two times a day (BID) | ORAL | 3 refills | Status: DC

## 2019-08-05 NOTE — Telephone Encounter (Signed)
Pt's medication was sent to pt's pharmacy as requested. Confirmation received.  °

## 2019-08-06 ENCOUNTER — Ambulatory Visit (INDEPENDENT_AMBULATORY_CARE_PROVIDER_SITE_OTHER): Payer: Medicare Other | Admitting: Cardiovascular Disease

## 2019-08-06 DIAGNOSIS — I480 Paroxysmal atrial fibrillation: Secondary | ICD-10-CM | POA: Diagnosis not present

## 2019-08-06 DIAGNOSIS — Z7901 Long term (current) use of anticoagulants: Secondary | ICD-10-CM

## 2019-08-06 DIAGNOSIS — Z86718 Personal history of other venous thrombosis and embolism: Secondary | ICD-10-CM

## 2019-08-06 LAB — POCT INR: INR: 2.1 (ref 2.0–3.0)

## 2019-08-11 ENCOUNTER — Other Ambulatory Visit: Payer: Self-pay | Admitting: Family Medicine

## 2019-08-13 ENCOUNTER — Ambulatory Visit (INDEPENDENT_AMBULATORY_CARE_PROVIDER_SITE_OTHER): Payer: Medicare Other | Admitting: Cardiology

## 2019-08-13 DIAGNOSIS — I480 Paroxysmal atrial fibrillation: Secondary | ICD-10-CM | POA: Diagnosis not present

## 2019-08-13 DIAGNOSIS — Z86718 Personal history of other venous thrombosis and embolism: Secondary | ICD-10-CM | POA: Diagnosis not present

## 2019-08-13 DIAGNOSIS — Z7901 Long term (current) use of anticoagulants: Secondary | ICD-10-CM | POA: Diagnosis not present

## 2019-08-13 LAB — POCT INR: INR: 2.1 (ref 2.0–3.0)

## 2019-08-20 ENCOUNTER — Telehealth (HOSPITAL_COMMUNITY): Payer: Self-pay

## 2019-08-20 ENCOUNTER — Emergency Department (HOSPITAL_COMMUNITY): Payer: Medicare Other

## 2019-08-20 ENCOUNTER — Emergency Department (HOSPITAL_COMMUNITY)
Admission: EM | Admit: 2019-08-20 | Discharge: 2019-08-20 | Disposition: A | Discharge status: Home / Self Care | Payer: Medicare Other | Attending: Emergency Medicine | Admitting: Emergency Medicine

## 2019-08-20 DIAGNOSIS — R000 Tachycardia, unspecified: Secondary | ICD-10-CM | POA: Diagnosis not present

## 2019-08-20 DIAGNOSIS — I4891 Unspecified atrial fibrillation: Secondary | ICD-10-CM | POA: Diagnosis not present

## 2019-08-20 DIAGNOSIS — Z9689 Presence of other specified functional implants: Secondary | ICD-10-CM | POA: Insufficient documentation

## 2019-08-20 DIAGNOSIS — I10 Essential (primary) hypertension: Secondary | ICD-10-CM | POA: Diagnosis not present

## 2019-08-20 DIAGNOSIS — R0602 Shortness of breath: Secondary | ICD-10-CM | POA: Diagnosis not present

## 2019-08-20 DIAGNOSIS — I119 Hypertensive heart disease without heart failure: Secondary | ICD-10-CM | POA: Diagnosis not present

## 2019-08-20 LAB — BASIC METABOLIC PANEL
Anion gap: 15 (ref 5–15)
BUN: 16 mg/dL (ref 8–23)
CO2: 21 mmol/L — ABNORMAL LOW (ref 22–32)
Calcium: 9.4 mg/dL (ref 8.9–10.3)
Chloride: 106 mmol/L (ref 98–111)
Creatinine, Ser: 0.65 mg/dL (ref 0.44–1.00)
GFR calc Af Amer: >60 mL/min (ref >60)
GFR calc non Af Amer: >60 mL/min (ref >60)
Glucose, Bld: 112 mg/dL — ABNORMAL HIGH (ref 70–99)
Potassium: 4.1 mmol/L (ref 3.5–5.1)
Sodium: 142 mmol/L (ref 135–145)

## 2019-08-20 LAB — CBC
HCT: 45.1 % (ref 36.0–46.0)
Hemoglobin: 14.4 g/dL (ref 12.0–15.0)
MCH: 29.6 pg (ref 26.0–34.0)
MCHC: 31.9 g/dL (ref 30.0–36.0)
MCV: 92.6 fL (ref 80.0–100.0)
Platelets: 223 10*3/uL (ref 150–400)
RBC: 4.87 MIL/uL (ref 3.87–5.11)
RDW: 13.2 % (ref 11.5–15.5)
WBC: 7.4 10*3/uL (ref 4.0–10.5)
nRBC: 0 % (ref 0.0–0.2)

## 2019-08-20 LAB — PROTIME-INR
INR: 1.8 — ABNORMAL HIGH (ref 0.8–1.2)
Prothrombin Time: 19.9 seconds — ABNORMAL HIGH (ref 11.4–15.2)

## 2019-08-20 LAB — TROPONIN I (HIGH SENSITIVITY)
Troponin I (High Sensitivity): 13 ng/L (ref <18)
Troponin I (High Sensitivity): 16 ng/L (ref <18)

## 2019-08-20 MED ORDER — METOPROLOL TARTRATE 5 MG/5ML IV SOLN
5.0000 mg | Freq: Once | INTRAVENOUS | Status: AC
  Administered 2019-08-20: 5 mg via INTRAVENOUS
  Filled 2019-08-20: qty 5

## 2019-08-20 MED ORDER — WARFARIN SODIUM 5 MG PO TABS
5.0000 mg | ORAL_TABLET | Freq: Once | ORAL | Status: DC
  Filled 2019-08-20: qty 1

## 2019-08-20 MED ORDER — METOPROLOL TARTRATE 25 MG PO TABS: 25.0000 mg | ORAL_TABLET | Freq: Two times a day (BID) | ORAL | Status: DC

## 2019-08-20 MED ORDER — METOPROLOL TARTRATE 25 MG PO TABS
25.0000 mg | ORAL_TABLET | Freq: Once | ORAL | Status: AC
  Administered 2019-08-20: 25 mg via ORAL
  Filled 2019-08-20: qty 1

## 2019-08-20 MED ORDER — SODIUM CHLORIDE 0.9 % IV BOLUS
1000.0000 mL | Freq: Once | INTRAVENOUS | Status: AC
  Administered 2019-08-20: 1000 mL via INTRAVENOUS

## 2019-08-20 MED ORDER — SODIUM CHLORIDE 0.9% FLUSH
3.0000 mL | Freq: Once | INTRAVENOUS | Status: AC
  Administered 2019-08-20: 3 mL via INTRAVENOUS

## 2019-08-20 NOTE — Telephone Encounter (Signed)
Reached out to patient to schedule ED follow-up. No answer left voicemail. 

## 2019-08-20 NOTE — ED Notes (Signed)
HR 145 upon arrival to triage.

## 2019-08-20 NOTE — Discharge Instructions (Signed)
Take your medications as normal.  As we discussed, we want you to follow-up with [facility].  Call their office and your cardiologist tomorrow to arrange follow-up.  Return to emergency department for any chest pain, palpitations, fever, vomiting or any other worsening concerning symptoms.

## 2019-08-20 NOTE — ED Triage Notes (Signed)
Pt arrives via [facility] from home with c/o palpitations and sob that began last night around 2300, took cardizem to try and control symptoms without relief. Pt found to be in afib rvr upon EMS arrival. Pt received cardizem 10mg  at 604 and then another 10mg  at 611 by ems with improvement of HR from 150s down to 90s. Known hx of afib. 20g iv R hand. Pt a/ox4, resp e/u, nad.

## 2019-08-20 NOTE — ED Notes (Signed)
Purewick applied to pt °

## 2019-08-20 NOTE — ED Notes (Signed)
Charge RN made aware of need for room.
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
## 2019-08-22 ENCOUNTER — Other Ambulatory Visit: Payer: Self-pay

## 2019-08-22 ENCOUNTER — Ambulatory Visit (HOSPITAL_COMMUNITY)
Admission: RE | Admit: 2019-08-22 | Discharge: 2019-08-22 | Disposition: A | Discharge status: Home / Self Care | Payer: Medicare Other | Source: Ambulatory Visit | Attending: Nurse Practitioner | Admitting: Nurse Practitioner

## 2019-08-22 ENCOUNTER — Encounter (HOSPITAL_COMMUNITY): Payer: Self-pay | Admitting: Nurse Practitioner

## 2019-08-22 VITALS — BP 134/90 | HR 120 | Ht 65.0 in | Wt 176.4 lb

## 2019-08-22 DIAGNOSIS — E785 Hyperlipidemia, unspecified: Secondary | ICD-10-CM | POA: Insufficient documentation

## 2019-08-22 DIAGNOSIS — D6869 Other thrombophilia: Secondary | ICD-10-CM | POA: Diagnosis not present

## 2019-08-22 DIAGNOSIS — M1990 Unspecified osteoarthritis, unspecified site: Secondary | ICD-10-CM | POA: Insufficient documentation

## 2019-08-22 DIAGNOSIS — Z96642 Presence of left artificial hip joint: Secondary | ICD-10-CM | POA: Insufficient documentation

## 2019-08-22 DIAGNOSIS — I10 Essential (primary) hypertension: Secondary | ICD-10-CM | POA: Insufficient documentation

## 2019-08-22 DIAGNOSIS — I480 Paroxysmal atrial fibrillation: Secondary | ICD-10-CM | POA: Insufficient documentation

## 2019-08-22 DIAGNOSIS — Z888 Allergy status to other drugs, medicaments and biological substances status: Secondary | ICD-10-CM | POA: Insufficient documentation

## 2019-08-22 DIAGNOSIS — Z86718 Personal history of other venous thrombosis and embolism: Secondary | ICD-10-CM | POA: Insufficient documentation

## 2019-08-22 DIAGNOSIS — Z7901 Long term (current) use of anticoagulants: Secondary | ICD-10-CM | POA: Insufficient documentation

## 2019-08-22 DIAGNOSIS — Z8249 Family history of ischemic heart disease and other diseases of the circulatory system: Secondary | ICD-10-CM | POA: Insufficient documentation

## 2019-08-22 DIAGNOSIS — I6529 Occlusion and stenosis of unspecified carotid artery: Secondary | ICD-10-CM | POA: Insufficient documentation

## 2019-08-22 DIAGNOSIS — Z8669 Personal history of other diseases of the nervous system and sense organs: Secondary | ICD-10-CM | POA: Insufficient documentation

## 2019-08-22 DIAGNOSIS — Z885 Allergy status to narcotic agent status: Secondary | ICD-10-CM | POA: Insufficient documentation

## 2019-08-22 DIAGNOSIS — Z79899 Other long term (current) drug therapy: Secondary | ICD-10-CM | POA: Insufficient documentation

## 2019-08-22 DIAGNOSIS — I2510 Atherosclerotic heart disease of native coronary artery without angina pectoris: Secondary | ICD-10-CM | POA: Insufficient documentation

## 2019-08-22 DIAGNOSIS — K219 Gastro-esophageal reflux disease without esophagitis: Secondary | ICD-10-CM | POA: Diagnosis not present

## 2019-08-22 DIAGNOSIS — Z952 Presence of prosthetic heart valve: Secondary | ICD-10-CM | POA: Insufficient documentation
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
## 2019-08-26 ENCOUNTER — Ambulatory Visit (HOSPITAL_COMMUNITY): Payer: Medicare Other | Admitting: Nurse Practitioner

## 2019-08-27 ENCOUNTER — Ambulatory Visit (INDEPENDENT_AMBULATORY_CARE_PROVIDER_SITE_OTHER): Payer: Medicare Other | Admitting: Cardiology

## 2019-08-27 DIAGNOSIS — Z86718 Personal history of other venous thrombosis and embolism: Secondary | ICD-10-CM

## 2019-08-27 DIAGNOSIS — Z7901 Long term (current) use of anticoagulants: Secondary | ICD-10-CM

## 2019-08-27 DIAGNOSIS — I480 Paroxysmal atrial fibrillation: Secondary | ICD-10-CM | POA: Diagnosis not present

## 2019-08-27 LAB — POCT INR: INR: 2.3 (ref 2.0–3.0)

## 2019-09-03 ENCOUNTER — Ambulatory Visit (INDEPENDENT_AMBULATORY_CARE_PROVIDER_SITE_OTHER): Payer: Medicare Other | Admitting: Pharmacist

## 2019-09-03 DIAGNOSIS — Z86718 Personal history of other venous thrombosis and embolism: Secondary | ICD-10-CM

## 2019-09-03 DIAGNOSIS — Z7901 Long term (current) use of anticoagulants: Secondary | ICD-10-CM | POA: Diagnosis not present

## 2019-09-03 DIAGNOSIS — I480 Paroxysmal atrial fibrillation: Secondary | ICD-10-CM | POA: Diagnosis not present

## 2019-09-03 LAB — POCT INR: INR: 2.1 (ref 2.0–3.0)

## 2019-09-09 ENCOUNTER — Ambulatory Visit (INDEPENDENT_AMBULATORY_CARE_PROVIDER_SITE_OTHER): Payer: Medicare Other | Admitting: Cardiology

## 2019-09-09 ENCOUNTER — Encounter: Payer: Self-pay | Admitting: Cardiology

## 2019-09-09 ENCOUNTER — Other Ambulatory Visit: Payer: Self-pay

## 2019-09-09 VITALS — BP 164/70 | HR 56 | Ht 65.0 in | Wt 180.0 lb

## 2019-09-09 DIAGNOSIS — I495 Sick sinus syndrome: Secondary | ICD-10-CM | POA: Diagnosis not present

## 2019-09-09 DIAGNOSIS — I480 Paroxysmal atrial fibrillation: Secondary | ICD-10-CM | POA: Diagnosis not present

## 2019-09-09 NOTE — Patient Instructions (Addendum)
Medication Instructions:  Your physician recommends that you continue on your current medications as directed. Please refer to the Current Medication list given to you today.  Labwork: None ordered.  Testing/Procedures: None ordered.  Follow-Up:  Your physician wants you to follow-up in: 3 months with Dr. Curt Bears.  You will receive a reminder letter in the mail two months in advance. If you don't receive a letter, please call our office to schedule the follow-up appointment.   Any Other Special Instructions Will Be Listed Below (If Applicable).  If you need a refill on your cardiac medications before your next appointment, please call your pharmacy.    Pacemaker Implantation, Adult Pacemaker implantation is a procedure to place a pacemaker inside your chest. A pacemaker is a small computer that sends electrical signals to the heart and helps your heart beat normally. A pacemaker also stores information about your heart rhythms. You may need pacemaker implantation if you:  Have a slow heartbeat (bradycardia).  Faint (syncope).  Have shortness of breath (dyspnea) due to heart problems. The pacemaker attaches to your heart through a wire, called a lead. Sometimes just one lead is needed. Other times, there will be two leads. There are two types of pacemakers:  Transvenous pacemaker. This type is placed under the skin or muscle of your chest. The lead goes through a vein in the chest area to reach the inside of the heart.  Epicardial pacemaker. This type is placed under the skin or muscle of your chest or belly. The lead goes through your chest to the outside of the heart. Tell a health care provider about:  Any allergies you have.  All medicines you are taking, including vitamins, herbs, eye drops, creams, and over-the-counter medicines.  Any problems you or family members have had with anesthetic medicines.  Any blood or bone disorders you have.  Any surgeries you have  had.  Any medical conditions you have.  Whether you are pregnant or may be pregnant. What are the risks? Generally, this is a safe procedure. However, problems may occur, including:  Infection.  Bleeding.  Failure of the pacemaker or the lead.  Collapse of a lung or bleeding into a lung.  Blood clot inside a blood vessel with a lead.  Damage to the heart.  Infection inside the heart (endocarditis).  Allergic reactions to medicines. What happens before the procedure? Staying hydrated Follow instructions from your health care provider about hydration, which may include:  Up to 2 hours before the procedure - you may continue to drink clear liquids, such as water, clear fruit juice, black coffee, and plain tea. Eating and drinking restrictions Follow instructions from your health care provider about eating and drinking, which may include:  8 hours before the procedure - stop eating heavy meals or foods such as meat, fried foods, or fatty foods.  6 hours before the procedure - stop eating light meals or foods, such as toast or cereal.  6 hours before the procedure - stop drinking milk or drinks that contain milk.  2 hours before the procedure - stop drinking clear liquids. Medicines  Ask your health care provider about: ? Changing or stopping your regular medicines. This is especially important if you are taking diabetes medicines or blood thinners. ? Taking medicines such as aspirin and ibuprofen. These medicines can thin your blood. Do not take these medicines before your procedure if your health care provider instructs you not to.  You may be given antibiotic medicine to help  prevent infection. General instructions  You will have a heart evaluation. This may include an electrocardiogram (ECG), chest X-ray, and heart imaging (echocardiogram,  or echo) tests.  You will have blood tests.  Do not use any products that contain nicotine or tobacco, such as cigarettes and  e-cigarettes. If you need help quitting, ask your health care provider.  Plan to have someone take you home from the hospital or clinic.  If you will be going home right after the procedure, plan to have someone with you for 24 hours.  Ask your health care provider how your surgical site will be marked or identified. What happens during the procedure?  To reduce your risk of infection: ? Your health care team will wash or sanitize their hands. ? Your skin will be washed with soap. ? Hair may be removed from the surgical area.  An IV tube will be inserted into one of your veins.  You will be given one or more of the following: ? A medicine to help you relax (sedative). ? A medicine to numb the area (local anesthetic). ? A medicine to make you fall asleep (general anesthetic).  If you are getting a transvenous pacemaker: ? An incision will be made in your upper chest. ? A pocket will be made for the pacemaker. It may be placed under the skin or between layers of muscle. ? The lead will be inserted into a blood vessel that returns to the heart. ? While X-rays are taken by an imaging machine (fluoroscopy), the lead will be advanced through the vein to the inside of your heart. ? The other end of the lead will be tunneled under the skin and attached to the pacemaker.  If you are getting an epicardial pacemaker: ? An incision will be made near your ribs or breastbone (sternum) for the lead. ? The lead will be attached to the outside of your heart. ? Another incision will be made in your chest or upper belly to create a pocket for the pacemaker. ? The free end of the lead will be tunneled under the skin and attached to the pacemaker.  The transvenous or epicardial pacemaker will be tested. Imaging studies may be done to check the lead position.  The incisions will be closed with stitches (sutures), adhesive strips, or skin glue.  Bandages (dressing) will be placed over the  incisions. The procedure may vary among health care providers and hospitals. What happens after the procedure?  Your blood pressure, heart rate, breathing rate, and blood oxygen level will be monitored until the medicines you were given have worn off.  You will be given antibiotics and pain medicine.  ECG and chest x-rays will be done.  You will wear a continuous type of ECG (Holter monitor) to check your heart rhythm.  Your health care provider will program the pacemaker.  Do not drive for 24 hours if you received a sedative. This information is not intended to replace advice given to you by your health care provider. Make sure you discuss any questions you have with your health care provider. Document Revised: 10/12/2017 Document Reviewed: 07/07/2015 Elsevier Patient Education  2020 [place].   Cardiac Ablation Cardiac ablation is a procedure to disable (ablate) a small amount of heart tissue in very specific places. The heart has many electrical connections. Sometimes these connections are abnormal and can cause the heart to beat very fast or irregularly. Ablating some of the problem areas can improve the heart rhythm or  return it to normal. Ablation may be done for people who:  Have Wolff-Parkinson-White syndrome.  Have fast heart rhythms (tachycardia).  Have taken medicines for an abnormal heart rhythm (arrhythmia) that were not effective or caused side effects.  Have a high-risk heartbeat that may be life-threatening. During the procedure, a small incision is made in the neck or the groin, and a long, thin, flexible tube (catheter) is inserted into the incision and moved to the heart. Small devices (electrodes) on the tip of the catheter will send out electrical currents. A type of X-ray (fluoroscopy) will be used to help guide the catheter and to provide images of the heart. Tell a health care provider about:  Any allergies you have.  All medicines you are taking,  including vitamins, herbs, eye drops, creams, and over-the-counter medicines.  Any problems you or family members have had with anesthetic medicines.  Any blood disorders you have.  Any surgeries you have had.  Any medical conditions you have, such as kidney failure.  Whether you are pregnant or may be pregnant. What are the risks? Generally, this is a safe procedure. However, problems may occur, including:  Infection.  Bruising and bleeding at the catheter insertion site.  Bleeding into the chest, especially into the sac that surrounds the heart. This is a serious complication.  Stroke or blood clots.  Damage to other structures or organs.  Allergic reaction to medicines or dyes.  Need for a permanent pacemaker if the normal electrical system is damaged. A pacemaker is a small computer that sends electrical signals to the heart and helps your heart beat normally.  The procedure not being fully effective. This may not be recognized until months later. Repeat ablation procedures are sometimes required. What happens before the procedure?  Follow instructions from your health care provider about eating or drinking restrictions.  Ask your health care provider about: ? Changing or stopping your regular medicines. This is especially important if you are taking diabetes medicines or blood thinners. ? Taking medicines such as aspirin and ibuprofen. These medicines can thin your blood. Do not take these medicines before your procedure if your health care provider instructs you not to.  Plan to have someone take you home from the hospital or clinic.  If you will be going home right after the procedure, plan to have someone with you for 24 hours. What happens during the procedure?  To lower your risk of infection: ? Your health care team will wash or sanitize their hands. ? Your skin will be washed with soap. ? Hair may be removed from the incision area.  An IV tube will be  inserted into one of your veins.  You will be given a medicine to help you relax (sedative).  The skin on your neck or groin will be numbed.  An incision will be made in your neck or your groin.  A needle will be inserted through the incision and into a large vein in your neck or groin.  A catheter will be inserted into the needle and moved to your heart.  Dye may be injected through the catheter to help your surgeon see the area of the heart that needs treatment.  Electrical currents will be sent from the catheter to ablate heart tissue in desired areas. There are three types of energy that may be used to ablate heart tissue: ? Heat (radiofrequency energy). ? Laser energy. ? Extreme cold (cryoablation).  When the necessary tissue has been ablated, the  catheter will be removed.  Pressure will be held on the catheter insertion area to prevent excessive bleeding.  A bandage (dressing) will be placed over the catheter insertion area. The procedure may vary among health care providers and hospitals. What happens after the procedure?  Your blood pressure, heart rate, breathing rate, and blood oxygen level will be monitored until the medicines you were given have worn off.  Your catheter insertion area will be monitored for bleeding. You will need to lie still for a few hours to ensure that you do not bleed from the catheter insertion area.  Do not drive for 24 hours or as long as directed by your health care provider. Summary  Cardiac ablation is a procedure to disable (ablate) a small amount of heart tissue in very specific places. Ablating some of the problem areas can improve the heart rhythm or return it to normal.  During the procedure, electrical currents will be sent from the catheter to ablate heart tissue in desired areas. This information is not intended to replace advice given to you by your health care provider. Make sure you discuss any questions you have with your health  care provider. Document Revised: 07/16/2017 Document Reviewed: 12/13/2015 Elsevier Patient Education  [place].
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
## 2019-09-10 ENCOUNTER — Telehealth: Payer: Self-pay | Admitting: Cardiology

## 2019-09-10 ENCOUNTER — Ambulatory Visit (INDEPENDENT_AMBULATORY_CARE_PROVIDER_SITE_OTHER): Payer: Medicare Other | Admitting: Pharmacist

## 2019-09-10 ENCOUNTER — Telehealth: Payer: Self-pay | Admitting: Cardiovascular Disease

## 2019-09-10 DIAGNOSIS — I4891 Unspecified atrial fibrillation: Secondary | ICD-10-CM

## 2019-09-10 DIAGNOSIS — Z01810 Encounter for preprocedural cardiovascular examination: Secondary | ICD-10-CM

## 2019-09-10 DIAGNOSIS — Z86718 Personal history of other venous thrombosis and embolism: Secondary | ICD-10-CM

## 2019-09-10 DIAGNOSIS — I4819 Other persistent atrial fibrillation: Secondary | ICD-10-CM

## 2019-09-10 DIAGNOSIS — Z7901 Long term (current) use of anticoagulants: Secondary | ICD-10-CM | POA: Diagnosis not present

## 2019-09-10 DIAGNOSIS — I480 Paroxysmal atrial fibrillation: Secondary | ICD-10-CM | POA: Diagnosis not present

## 2019-09-10 DIAGNOSIS — I4811 Longstanding persistent atrial fibrillation: Secondary | ICD-10-CM

## 2019-09-10 LAB — POCT INR: INR: 1.8 — AB (ref 2.0–3.0)

## 2019-09-10 NOTE — Telephone Encounter (Signed)
This is a [facility] pt, will route to appropriate Clinic.

## 2019-09-10 NOTE — Telephone Encounter (Signed)
New Message:       Please call, pt wants to discuss her taking Eliquis instead of Coumadin.g

## 2019-09-10 NOTE — Telephone Encounter (Signed)
1.  Paroxysmal atrial fibrillation: On warfarin with a CHA2DS2-VASc of 3.  Amiodarone has been stopped due to elevated LFTs, though she did have a common bile duct stone.  Unfortunately she is also failed dofetilide and Multaq.  Increases in her beta-blocker because significant sinus bradycardia.  I spoke with her about the possibility of ablation for atrial fibrillation versus pacemaker implant and adjustment of medications for tachybradycardia syndrome.  We did discuss the risks and benefits of both procedures.  She would like to further discuss this with her family and will get back to [place] with any answer.   Called patient. She would like to schedule ablation.

## 2019-09-10 NOTE — Telephone Encounter (Signed)
Agree. Thanks

## 2019-09-10 NOTE — Telephone Encounter (Signed)
Spoke with patient and let her know that Eliquis is not a problem with the TAVR valve.

## 2019-09-10 NOTE — Telephone Encounter (Signed)
I already talked to patient about Eliquis this morning She will like input from the DR [facility] in relation to her aortic valve.

## 2019-09-10 NOTE — Telephone Encounter (Signed)
Follow Up:   Pt said she discussed having a procedure with Dr Curt Bears on yesterday(09-09-19).  She is ready to schedule that procedure.

## 2019-09-11 MED ORDER — APIXABAN 5 MG PO TABS
5.0000 mg | ORAL_TABLET | Freq: Two times a day (BID) | ORAL | 3 refills | Status: DC
Start: 2019-09-11 — End: 2020-03-02

## 2019-09-11 NOTE — Telephone Encounter (Addendum)
After talking with the [facility] in the Anticoagulation Clinic.the patient to D/C Coumadin (last dose evening of 09/10/19) and to start Eliquis 5 mg po bid starting this evening. Advised pt no missed doses to take everyday but if any problems to please call let us know. Pt verbalized understanding and agreed.  Pt agreed to Afib Ablation with CT prior. Scheduled for 10/10/19 at 10:30 am.

## 2019-09-12 ENCOUNTER — Other Ambulatory Visit: Payer: Self-pay

## 2019-09-12 DIAGNOSIS — I4811 Longstanding persistent atrial fibrillation: Secondary | ICD-10-CM

## 2019-09-12 NOTE — Progress Notes (Signed)
Engineer, production

## 2019-09-15 NOTE — Telephone Encounter (Signed)
Pt wants to make sure her instructions are going to be sent through the mail for her. She said she does not always get her messages on Mychart.   She also wants to know if she needs to continue checking her INR on Wednesday with her machine, since her medication got changed to eliquis.

## 2019-09-16 NOTE — Telephone Encounter (Signed)
Call returned to Pt.  Pt will come for lab work on 09/18/2019.  Need to give samples for new start Eliquis and 30 day free card.    Need to correct instruction letters and give to Pt.  Advised Pt she does not need to check INR anymore on Eliquis.

## 2019-09-18 ENCOUNTER — Other Ambulatory Visit: Payer: Self-pay

## 2019-09-18 ENCOUNTER — Other Ambulatory Visit: Payer: Medicare Other

## 2019-09-18 DIAGNOSIS — Z01810 Encounter for preprocedural cardiovascular examination: Secondary | ICD-10-CM

## 2019-09-18 DIAGNOSIS — I4819 Other persistent atrial fibrillation: Secondary | ICD-10-CM

## 2019-09-18 DIAGNOSIS — I4891 Unspecified atrial fibrillation: Secondary | ICD-10-CM

## 2019-09-18 LAB — CBC WITH DIFFERENTIAL/PLATELET
Basophils Absolute: 0.1 10*3/uL (ref 0.0–0.2)
Basos: 1 %
EOS (ABSOLUTE): 0.1 10*3/uL (ref 0.0–0.4)
Eos: 2 %
Hematocrit: 39.5 % (ref 34.0–46.6)
Hemoglobin: 12.9 g/dL (ref 11.1–15.9)
Immature Grans (Abs): 0 10*3/uL (ref 0.0–0.1)
Immature Granulocytes: 0 %
Lymphocytes Absolute: 1.3 10*3/uL (ref 0.7–3.1)
Lymphs: 17 %
MCH: 30.4 pg (ref 26.6–33.0)
MCHC: 32.7 g/dL (ref 31.5–35.7)
MCV: 93 fL (ref 79–97)
Monocytes Absolute: 0.7 10*3/uL (ref 0.1–0.9)
Monocytes: 9 %
Neutrophils Absolute: 5.6 10*3/uL (ref 1.4–7.0)
Neutrophils: 71 %
Platelets: 217 10*3/uL (ref 150–450)
RBC: 4.24 x10E6/uL (ref 3.77–5.28)
RDW: 13.1 % (ref 11.7–15.4)
WBC: 7.8 10*3/uL (ref 3.4–10.8)

## 2019-09-18 LAB — BASIC METABOLIC PANEL
BUN/Creatinine Ratio: 25 (ref 12–28)
BUN: 16 mg/dL (ref 8–27)
CO2: 27 mmol/L (ref 20–29)
Calcium: 9.2 mg/dL (ref 8.7–10.3)
Chloride: 102 mmol/L (ref 96–106)
Creatinine, Ser: 0.65 mg/dL (ref 0.57–1.00)
GFR calc Af Amer: 97 mL/min/{1.73_m2} (ref >59)
GFR calc non Af Amer: 84 mL/min/{1.73_m2} (ref >59)
Glucose: 80 mg/dL (ref 65–99)
Potassium: 4.5 mmol/L (ref 3.5–5.2)
Sodium: 141 mmol/L (ref 134–144)

## 2019-09-30 ENCOUNTER — Telehealth: Payer: Self-pay

## 2019-09-30 NOTE — Telephone Encounter (Signed)
I called as instructed by raquel pharmd to schedule inr and the pt stated that she is on eliquis prescribed by Dr. Curt Bears and even got verbal approval from a pharmd to take it.

## 2019-10-03 ENCOUNTER — Telehealth (HOSPITAL_COMMUNITY): Payer: Self-pay | Admitting: Emergency Medicine

## 2019-10-03 NOTE — Telephone Encounter (Signed)
Reaching out to patient to offer assistance regarding upcoming cardiac imaging study; pt verbalizes understanding of appt date/time, parking situation and where to check in, pre-test NPO status and medications ordered, and verified current allergies; name and call back number provided for further questions should they arise Sowmya [place] RN Navigator Cardiac Imaging [facility] [phone] office [phone] cell 

## 2019-10-06 ENCOUNTER — Encounter (HOSPITAL_COMMUNITY): Payer: Self-pay

## 2019-10-06 ENCOUNTER — Other Ambulatory Visit: Payer: Self-pay

## 2019-10-06 ENCOUNTER — Ambulatory Visit (HOSPITAL_COMMUNITY)
Admission: RE | Admit: 2019-10-06 | Discharge: 2019-10-06 | Disposition: A | Discharge status: Home / Self Care | Payer: Medicare Other | Source: Ambulatory Visit | Attending: Cardiology | Admitting: Cardiology

## 2019-10-06 DIAGNOSIS — I4811 Longstanding persistent atrial fibrillation: Secondary | ICD-10-CM | POA: Diagnosis not present

## 2019-10-06 MED ORDER — IOHEXOL 350 MG/ML SOLN
80.0000 mL | Freq: Once | INTRAVENOUS | Status: AC | PRN
  Administered 2019-10-06: 80 mL via INTRAVENOUS

## 2019-10-08 ENCOUNTER — Other Ambulatory Visit (HOSPITAL_COMMUNITY)
Admission: RE | Admit: 2019-10-08 | Discharge: 2019-10-08 | Disposition: A | Discharge status: Home / Self Care | Payer: Medicare Other | Source: Ambulatory Visit | Attending: Cardiology | Admitting: Cardiology

## 2019-10-08 ENCOUNTER — Other Ambulatory Visit: Payer: Medicare Other

## 2019-10-08 DIAGNOSIS — Z20822 Contact with and (suspected) exposure to covid-19: Secondary | ICD-10-CM | POA: Insufficient documentation

## 2019-10-08 DIAGNOSIS — Z01812 Encounter for preprocedural laboratory examination: Secondary | ICD-10-CM | POA: Diagnosis not present

## 2019-10-08 LAB — SARS CORONAVIRUS 2 (TAT 6-24 HRS): SARS Coronavirus 2: NEGATIVE

## 2019-10-09 NOTE — Progress Notes (Signed)
Instructed patient on the following items: Arrival time 0830 Nothing to eat or drink after midnight No meds AM of procedure Responsible person to drive you home and stay with you for 24 hrs  Have you missed any doses of anti-coagulant on Eliquis- no doses missed

## 2019-10-10 ENCOUNTER — Ambulatory Visit (HOSPITAL_COMMUNITY)
Admission: RE | Disposition: A | Discharge status: Home / Self Care | Payer: Medicare Other | Source: Home / Self Care | Attending: Cardiology

## 2019-10-10 ENCOUNTER — Ambulatory Visit (HOSPITAL_COMMUNITY): Payer: Medicare Other | Admitting: Anesthesiology

## 2019-10-10 ENCOUNTER — Other Ambulatory Visit: Payer: Self-pay

## 2019-10-10 ENCOUNTER — Encounter (HOSPITAL_COMMUNITY): Payer: Self-pay | Admitting: Cardiology

## 2019-10-10 ENCOUNTER — Ambulatory Visit (HOSPITAL_COMMUNITY)
Admission: RE | Admit: 2019-10-10 | Discharge: 2019-10-10 | Disposition: A | Discharge status: Home / Self Care | Payer: Medicare Other | Attending: Cardiology | Admitting: Cardiology

## 2019-10-10 DIAGNOSIS — R0609 Other forms of dyspnea: Secondary | ICD-10-CM | POA: Insufficient documentation

## 2019-10-10 DIAGNOSIS — I480 Paroxysmal atrial fibrillation: Secondary | ICD-10-CM | POA: Diagnosis not present

## 2019-10-10 DIAGNOSIS — I6529 Occlusion and stenosis of unspecified carotid artery: Secondary | ICD-10-CM | POA: Insufficient documentation

## 2019-10-10 DIAGNOSIS — I872 Venous insufficiency (chronic) (peripheral): Secondary | ICD-10-CM | POA: Insufficient documentation

## 2019-10-10 DIAGNOSIS — D649 Anemia, unspecified: Secondary | ICD-10-CM | POA: Diagnosis not present

## 2019-10-10 DIAGNOSIS — Z952 Presence of prosthetic heart valve: Secondary | ICD-10-CM | POA: Insufficient documentation

## 2019-10-10 DIAGNOSIS — I2510 Atherosclerotic heart disease of native coronary artery without angina pectoris: Secondary | ICD-10-CM | POA: Insufficient documentation

## 2019-10-10 DIAGNOSIS — Z86718 Personal history of other venous thrombosis and embolism: Secondary | ICD-10-CM | POA: Diagnosis not present

## 2019-10-10 DIAGNOSIS — E785 Hyperlipidemia, unspecified: Secondary | ICD-10-CM | POA: Insufficient documentation

## 2019-10-10 DIAGNOSIS — Z885 Allergy status to narcotic agent status: Secondary | ICD-10-CM | POA: Insufficient documentation

## 2019-10-10 DIAGNOSIS — I10 Essential (primary) hypertension: Secondary | ICD-10-CM | POA: Insufficient documentation

## 2019-10-10 DIAGNOSIS — I5032 Chronic diastolic (congestive) heart failure: Secondary | ICD-10-CM | POA: Diagnosis not present

## 2019-10-10 DIAGNOSIS — I350 Nonrheumatic aortic (valve) stenosis: Secondary | ICD-10-CM | POA: Diagnosis not present

## 2019-10-10 DIAGNOSIS — I110 Hypertensive heart disease with heart failure: Secondary | ICD-10-CM | POA: Diagnosis not present

## 2019-10-10 DIAGNOSIS — K219 Gastro-esophageal reflux disease without esophagitis: Secondary | ICD-10-CM | POA: Diagnosis not present

## 2019-10-10 HISTORY — PX: ATRIAL FIBRILLATION ABLATION: EP1191

## 2019-10-10 LAB — POCT ACTIVATED CLOTTING TIME
Activated Clotting Time: 290 seconds
Activated Clotting Time: 345 seconds

## 2019-10-10 SURGERY — ATRIAL FIBRILLATION ABLATION
Anesthesia: General

## 2019-10-10 MED ORDER — LIDOCAINE 2% (20 MG/ML) 5 ML SYRINGE
INTRAMUSCULAR | Status: DC | PRN
  Administered 2019-10-10: 100 mg via INTRAVENOUS

## 2019-10-10 MED ORDER — PROPOFOL 10 MG/ML IV BOLUS
INTRAVENOUS | Status: DC | PRN
  Administered 2019-10-10: 150 mg via INTRAVENOUS

## 2019-10-10 MED ORDER — CEFAZOLIN SODIUM-DEXTROSE 2-4 GM/100ML-% IV SOLN
INTRAVENOUS | Status: AC
  Filled 2019-10-10: qty 100

## 2019-10-10 MED ORDER — DEXAMETHASONE SODIUM PHOSPHATE 10 MG/ML IJ SOLN
INTRAMUSCULAR | Status: DC | PRN
  Administered 2019-10-10: 8 mg via INTRAVENOUS

## 2019-10-10 MED ORDER — SODIUM CHLORIDE 0.9% FLUSH: 3.0000 mL | INTRAVENOUS | Status: DC | PRN

## 2019-10-10 MED ORDER — HEPARIN SODIUM (PORCINE) 1000 UNIT/ML IJ SOLN
INTRAMUSCULAR | Status: AC
  Filled 2019-10-10: qty 1

## 2019-10-10 MED ORDER — HEPARIN (PORCINE) IN NACL 1000-0.9 UT/500ML-% IV SOLN
INTRAVENOUS | Status: AC
  Filled 2019-10-10: qty 500

## 2019-10-10 MED ORDER — CEFAZOLIN SODIUM-DEXTROSE 1-4 GM/50ML-% IV SOLN
1.0000 g | Freq: Three times a day (TID) | INTRAVENOUS | Status: DC
  Administered 2019-10-10: 2 g via INTRAVENOUS

## 2019-10-10 MED ORDER — ROCURONIUM BROMIDE 10 MG/ML (PF) SYRINGE
PREFILLED_SYRINGE | INTRAVENOUS | Status: DC | PRN
  Administered 2019-10-10: 70 mg via INTRAVENOUS

## 2019-10-10 MED ORDER — SUGAMMADEX SODIUM 200 MG/2ML IV SOLN
INTRAVENOUS | Status: DC | PRN
  Administered 2019-10-10: 180 mg via INTRAVENOUS

## 2019-10-10 MED ORDER — PROTAMINE SULFATE 10 MG/ML IV SOLN
INTRAVENOUS | Status: DC | PRN
  Administered 2019-10-10: 40 mg via INTRAVENOUS

## 2019-10-10 MED ORDER — ONDANSETRON HCL 4 MG/2ML IJ SOLN: 4.0000 mg | Freq: Four times a day (QID) | INTRAMUSCULAR | Status: DC | PRN

## 2019-10-10 MED ORDER — SODIUM CHLORIDE 0.9 % IV SOLN: INTRAVENOUS | Status: DC

## 2019-10-10 MED ORDER — HEPARIN SODIUM (PORCINE) 1000 UNIT/ML IJ SOLN
INTRAMUSCULAR | Status: DC | PRN
  Administered 2019-10-10: 13000 [IU] via INTRAVENOUS
  Administered 2019-10-10: 3000 [IU] via INTRAVENOUS
  Administered 2019-10-10: 1000 [IU] via INTRAVENOUS

## 2019-10-10 MED ORDER — SODIUM CHLORIDE 0.9 % IV SOLN: 250.0000 mL | INTRAVENOUS | Status: DC | PRN

## 2019-10-10 MED ORDER — PHENYLEPHRINE HCL-NACL 10-0.9 MG/250ML-% IV SOLN
INTRAVENOUS | Status: DC | PRN
  Administered 2019-10-10: 20 ug/min via INTRAVENOUS

## 2019-10-10 MED ORDER — FENTANYL CITRATE (PF) 250 MCG/5ML IJ SOLN
INTRAMUSCULAR | Status: DC | PRN
Start: 2019-10-10 — End: 2019-10-10
  Administered 2019-10-10: 50 ug via INTRAVENOUS

## 2019-10-10 MED ORDER — DOBUTAMINE IN D5W 4-5 MG/ML-% IV SOLN
INTRAVENOUS | Status: AC
  Filled 2019-10-10: qty 250

## 2019-10-10 MED ORDER — ACETAMINOPHEN 325 MG PO TABS
650.0000 mg | ORAL_TABLET | ORAL | Status: DC | PRN
  Filled 2019-10-10: qty 2

## 2019-10-10 MED ORDER — HEPARIN SODIUM (PORCINE) 1000 UNIT/ML IJ SOLN
INTRAMUSCULAR | Status: DC | PRN
  Administered 2019-10-10: 1000 [IU] via INTRAVENOUS

## 2019-10-10 MED ORDER — ONDANSETRON HCL 4 MG/2ML IJ SOLN
INTRAMUSCULAR | Status: DC | PRN
  Administered 2019-10-10: 4 mg via INTRAVENOUS

## 2019-10-10 MED ORDER — HEPARIN (PORCINE) IN NACL 2-0.9 UNITS/ML
INTRAMUSCULAR | Status: AC | PRN
  Administered 2019-10-10 (×5): 500 mL

## 2019-10-10 MED ORDER — DOBUTAMINE IN D5W 4-5 MG/ML-% IV SOLN
INTRAVENOUS | Status: DC | PRN
  Administered 2019-10-10: 20 ug/kg/min via INTRAVENOUS

## 2019-10-10 SURGICAL SUPPLY — 22 items
BLANKET WARM UNDERBOD FULL ACC (MISCELLANEOUS) ×3 IMPLANT
CATH 8FR REPROCESSED SOUNDSTAR (CATHETERS) ×3 IMPLANT
CATH 8FR SOUNDSTAR REPROCESSED (CATHETERS) IMPLANT
CATH MAPPNG PENTARAY F 2-6-2MM (CATHETERS) IMPLANT
CATH S CIRCA THERM PROBE 10F (CATHETERS) ×2 IMPLANT
CATH SMTCH THERMOCOOL SF DF (CATHETERS) ×2 IMPLANT
CATH WEBSTER BI DIR CS D-F CRV (CATHETERS) ×2 IMPLANT
COVER SWIFTLINK CONNECTOR (BAG) ×3 IMPLANT
DEVICE CLOSURE PERCLS PRGLD 6F (VASCULAR PRODUCTS) IMPLANT
KIT CATH VERSACROSS STEERABLE (CATHETERS) ×2 IMPLANT
PACK EP LATEX FREE (CUSTOM PROCEDURE TRAY) ×3
PACK EP LF (CUSTOM PROCEDURE TRAY) ×1 IMPLANT
PAD PRO RADIOLUCENT 2001M-C (PAD) ×3 IMPLANT
PATCH CARTO3 (PAD) ×2 IMPLANT
PENTARAY F 2-6-2MM (CATHETERS) ×3
PERCLOSE PROGLIDE 6F (VASCULAR PRODUCTS) ×12
SHEATH CARTO VIZIGO SM CVD (SHEATH) ×2 IMPLANT
SHEATH PINNACLE 7F 10CM (SHEATH) ×2 IMPLANT
SHEATH PINNACLE 8F 10CM (SHEATH) ×4 IMPLANT
SHEATH PINNACLE 9F 10CM (SHEATH) ×2 IMPLANT
SHEATH PROBE COVER 6X72 (BAG) ×2 IMPLANT
TUBING SMART ABLATE COOLFLOW (TUBING) ×2 IMPLANT

## 2019-10-10 NOTE — Anesthesia Preprocedure Evaluation (Addendum)
Anesthesia Evaluation  Patient identified by MRN, date of birth, ID band Patient awake    Reviewed: Allergy & Precautions, NPO status , Patient's Chart, lab work & pertinent test results  History of Anesthesia Complications (+) PONV and history of anesthetic complications  Airway Mallampati: II  TM Distance: >3 FB Neck ROM: Full    Dental  (+) Edentulous Lower, Edentulous Upper   Pulmonary neg pulmonary ROS,    Pulmonary exam normal        Cardiovascular hypertension, Pt. on home beta blockers and Pt. on medications + CAD, + Peripheral Vascular Disease (carotid stenosis), +CHF and + DVT  Normal cardiovascular exam+ dysrhythmias (on Coumadin for years, switched to eliquis a few weeks ago) Atrial Fibrillation + Valvular Problems/Murmurs (s/p TAVR 2019) AS   Atrial fibrillation with rapid ventricular response Left axis deviation Left ventricular hypertrophy ( R in aVL , Cornell product , Romhilt-Estes ) Nonspecific ST abnormality Abnormal ECG Since previous tracing Atrial fibrillation Confirmed by [employer], Will [phone]) on 08/22/2019 9:22:16 PM  S/P TAVR TTE 10/2018 EF 60-65%, normally functioning TAVR with mean gradient of 7.8 mm Hg and no PVL    Neuro/Psych Dementia negative neurological ROS     GI/Hepatic Neg liver ROS, GERD  ,  Endo/Other  negative endocrine ROS  Renal/GU negative Renal ROS  negative genitourinary   Musculoskeletal  (+) Arthritis ,   Abdominal   Peds  Hematology  (+) Blood dyscrasia (on coumadin), ,   Anesthesia Other Findings   Reproductive/Obstetrics                            Anesthesia Physical  Anesthesia Plan  ASA: III  Anesthesia Plan: General   Post-op Pain Management:    Induction: Intravenous  PONV Risk Score and Plan: 4 or greater and Dexamethasone, Ondansetron and Treatment may vary due to age or medical condition  Airway Management Planned: Oral  ETT  Additional Equipment:   Intra-op Plan:   Post-operative Plan: Extubation in OR  Informed Consent: I have reviewed the patients History and Physical, chart, labs and discussed the procedure including the risks, benefits and alternatives for the proposed anesthesia with the patient or authorized representative who has indicated his/her understanding and acceptance.     Dental advisory given  Plan Discussed with: CRNA and Anesthesiologist  Anesthesia Plan Comments:         Anesthesia Quick Evaluation
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
## 2019-10-10 NOTE — Discharge Instructions (Signed)

## 2019-10-10 NOTE — Anesthesia Procedure Notes (Signed)
Procedure Name: Intubation Date/Time: 10/10/2019 11:18 AM Performed by: Verdie Drown, CRNA Pre-anesthesia Checklist: Patient identified, Emergency Drugs available, Suction available and Patient being monitored Patient Re-evaluated:Patient Re-evaluated prior to induction Oxygen Delivery Method: Circle System Utilized Preoxygenation: Pre-oxygenation with 100% oxygen Induction Type: IV induction Ventilation: Mask ventilation without difficulty Laryngoscope Size: Mac and 3 Grade View: Grade I Tube type: Oral Tube size: 7.0 mm Number of attempts: 1 Airway Equipment and Method: Stylet and Oral airway Placement Confirmation: ETT inserted through vocal cords under direct vision,  positive ETCO2 and breath sounds checked- equal and bilateral Secured at: 21 cm Tube secured with: Tape Dental Injury: Teeth and Oropharynx as per pre-operative assessment

## 2019-10-10 NOTE — Transfer of Care (Signed)
Immediate Anesthesia Transfer of Care Note  Patient: Mikayla [name]  Procedure(s) Performed: ATRIAL FIBRILLATION ABLATION (N/A )  Patient Location: Cath Lab  Anesthesia Type:General  Level of Consciousness: awake, alert  and oriented  Airway & Oxygen Therapy: Patient Spontanous Breathing  Post-op Assessment: Report given to RN, Post -op Vital signs reviewed and stable and Patient moving all extremities  Post vital signs: Reviewed and stable  Last Vitals:  Vitals Value Taken Time  BP    Temp    Pulse    Resp    SpO2      Last Pain:  Vitals:   10/10/19 0926  TempSrc:   PainSc: 0-No pain         Complications: No complications documented.

## 2019-10-12 DIAGNOSIS — R0602 Shortness of breath: Secondary | ICD-10-CM | POA: Diagnosis not present

## 2019-10-13 MED FILL — Cefazolin Sodium-Dextrose IV Solution 2 GM/100ML-4%: INTRAVENOUS | Qty: 100 | Status: AC

## 2019-10-14 ENCOUNTER — Encounter (HOSPITAL_COMMUNITY): Payer: Self-pay | Admitting: Cardiology

## 2019-10-14 ENCOUNTER — Telehealth: Payer: Self-pay | Admitting: Cardiology

## 2019-10-14 NOTE — Telephone Encounter (Signed)
Patient following up. She would like to speak with anyone.

## 2019-10-14 NOTE — Telephone Encounter (Signed)
Pt c/o Shortness Of Breath: STAT if SOB developed within the last 24 hours or pt is noticeably SOB on the phone  1. Are you currently SOB (can you hear that pt is SOB on the phone)? no  2. How long have you been experiencing SOB? A while, patient had an ablation on Friday 10/10/19 for afib that was accompanied by SOB.  3. Are you SOB when sitting or when up moving around? Up moving around   4. Are you currently experiencing any other symptoms? No - patient states she had an episode Sunday morning around 3am where she had extreme SOB and she called 911. She had an ablation on 10/10/19 and was advised to call if any symptoms arised so she is calling now.

## 2019-10-14 NOTE — Telephone Encounter (Signed)
Reports doing better today, she simply was told to inform us if any issues post procedure and that is why she is calling. Reports she awoke SOB Sunday morning, anytime she got up and moved around Sunday she would experience SOB. She awoke to chest feeling heavy and it hurt to breathe. She called EMS who "checked her out and she was fine".  Denies current chest pain, sob or edema. She reports she is doing better today and has not experienced since Saturday. Aware this is not abnormal occurrence post procedure.  Advised to call back if she experiences again. Patient verbalized understanding and agreeable to plan.

## 2019-10-15 ENCOUNTER — Telehealth: Payer: Self-pay | Admitting: Cardiology

## 2019-10-15 NOTE — Telephone Encounter (Signed)
Pt reports waking up this morning in AFib.  Last about 2 hours, HR went up to 153. Pt currently back in NSR (she has a kardia device), feeling fine.. Pt advised to continue to monitor.  Advised if reoccurs to call AFib clinic to further discuss.  Again reviewed post op breakthrough AFib. Patient verbalized understanding and agreeable to plan.

## 2019-10-15 NOTE — Telephone Encounter (Signed)
     Pt said she woke up this morning with afib, she was on afib for 2 hours, she took metoprolol and now feeling ok. She would like to speak with RN to discuss

## 2019-10-15 NOTE — Anesthesia Postprocedure Evaluation (Signed)
Anesthesia Post Note  Patient: Mikayla [name]  Procedure(s) Performed: ATRIAL FIBRILLATION ABLATION (N/A )     Patient location during evaluation: PACU Anesthesia Type: General Level of consciousness: awake and alert Pain management: pain level controlled Vital Signs Assessment: post-procedure vital signs reviewed and stable Respiratory status: spontaneous breathing, nonlabored ventilation, respiratory function stable and patient connected to nasal cannula oxygen Cardiovascular status: blood pressure returned to baseline and stable Postop Assessment: no apparent nausea or vomiting Anesthetic complications: no   No complications documented.  Last Vitals:  Vitals:   10/10/19 1830 10/10/19 1920  BP: (!) 146/70 (!) 140/55  Pulse: 70 60  Resp: 14 16  Temp:    SpO2: 100% 99%    Last Pain:  Vitals:   10/14/19 1425  TempSrc:   PainSc: 0-No pain                 Tiajuana Amass

## 2019-11-07 ENCOUNTER — Other Ambulatory Visit: Payer: Self-pay | Admitting: Family Medicine

## 2019-11-07 NOTE — Telephone Encounter (Signed)
Patient is aware she needs an appointment however, patient can not do a virtual her daughter is out town and she normally sets that up.   Please advise

## 2019-11-07 NOTE — Telephone Encounter (Signed)
Medication: gabapentin (NEURONTIN) 800 MG tablet [[id number]]    Has the patient contacted their pharmacy? No. (If no, request that the patient contact the pharmacy for the refill.) (If yes, when and what did the pharmacy advise?)  Preferred Pharmacy (with phone number or street name): [facility] [place], [place] - [address]  [address], [place] [address]  Phone:  [phone] Fax:  [phone]  DEA #:  --  Agent: Please be advised that RX refills may take up to 3 business days. We ask that you follow-up with your pharmacy.

## 2019-11-10 ENCOUNTER — Ambulatory Visit (HOSPITAL_COMMUNITY)
Admission: RE | Admit: 2019-11-10 | Discharge: 2019-11-10 | Disposition: A | Discharge status: Home / Self Care | Payer: Medicare Other | Source: Ambulatory Visit | Attending: Nurse Practitioner | Admitting: Nurse Practitioner

## 2019-11-10 ENCOUNTER — Encounter (HOSPITAL_COMMUNITY): Payer: Self-pay | Admitting: Nurse Practitioner

## 2019-11-10 ENCOUNTER — Other Ambulatory Visit: Payer: Self-pay

## 2019-11-10 VITALS — BP 128/70 | HR 57 | Ht 65.0 in | Wt 177.6 lb

## 2019-11-10 DIAGNOSIS — D6869 Other thrombophilia: Secondary | ICD-10-CM | POA: Diagnosis not present

## 2019-11-10 DIAGNOSIS — I480 Paroxysmal atrial fibrillation: Secondary | ICD-10-CM | POA: Insufficient documentation

## 2019-11-10 DIAGNOSIS — Z79899 Other long term (current) drug therapy: Secondary | ICD-10-CM | POA: Diagnosis not present

## 2019-11-10 DIAGNOSIS — Z7901 Long term (current) use of anticoagulants: Secondary | ICD-10-CM | POA: Diagnosis not present

## 2019-11-10 DIAGNOSIS — M5137 Other intervertebral disc degeneration, lumbosacral region: Secondary | ICD-10-CM | POA: Insufficient documentation

## 2019-11-10 DIAGNOSIS — I4819 Other persistent atrial fibrillation: Secondary | ICD-10-CM

## 2019-11-10 DIAGNOSIS — R0602 Shortness of breath: Secondary | ICD-10-CM | POA: Insufficient documentation

## 2019-11-10 DIAGNOSIS — Z86718 Personal history of other venous thrombosis and embolism: Secondary | ICD-10-CM | POA: Insufficient documentation

## 2019-11-10 DIAGNOSIS — K219 Gastro-esophageal reflux disease without esophagitis: Secondary | ICD-10-CM | POA: Diagnosis not present

## 2019-11-10 DIAGNOSIS — Z8249 Family history of ischemic heart disease and other diseases of the circulatory system: Secondary | ICD-10-CM | POA: Diagnosis not present

## 2019-11-10 DIAGNOSIS — M810 Age-related osteoporosis without current pathological fracture: Secondary | ICD-10-CM | POA: Diagnosis not present

## 2019-11-10 DIAGNOSIS — E785 Hyperlipidemia, unspecified: Secondary | ICD-10-CM | POA: Diagnosis not present

## 2019-11-10 DIAGNOSIS — R011 Cardiac murmur, unspecified: Secondary | ICD-10-CM | POA: Insufficient documentation

## 2019-11-10 DIAGNOSIS — I10 Essential (primary) hypertension: Secondary | ICD-10-CM | POA: Insufficient documentation

## 2019-11-10 DIAGNOSIS — I2510 Atherosclerotic heart disease of native coronary artery without angina pectoris: Secondary | ICD-10-CM | POA: Insufficient documentation
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
## 2019-11-10 NOTE — Telephone Encounter (Signed)
Would you mind scheduling Virtual Visit please?  Dr. Charlett Blake will see her, for Rx Refill Request.  If not , medication will not be able to be filled.  Thanks.

## 2019-11-11 ENCOUNTER — Telehealth: Payer: Self-pay | Admitting: Family Medicine

## 2019-11-11 DIAGNOSIS — Z23 Encounter for immunization: Secondary | ICD-10-CM | POA: Diagnosis not present

## 2019-11-11 MED ORDER — GABAPENTIN 800 MG PO TABS: 800.0000 mg | ORAL_TABLET | Freq: Two times a day (BID) | ORAL | 0 refills | Status: DC

## 2019-11-11 NOTE — Telephone Encounter (Signed)
Medication: gabapentin (NEURONTIN) 800 MG tablet [[id number]]       Has the patient contacted their pharmacy?  (If no, request that the patient contact the pharmacy for the refill.) (If yes, when and what did the pharmacy advise?)     Preferred Pharmacy (with phone number or street name): [facility] [place], [place] - [address]  [address], [place] [address]  Phone:  [phone] Fax:  [phone]      Agent: Please be advised that RX refills may take up to 3 business days. We ask that you follow-up with your pharmacy.

## 2019-11-11 NOTE — Telephone Encounter (Signed)
rx sent in 

## 2019-11-11 NOTE — Telephone Encounter (Signed)
Patient has schedule appt for 12/29/19 (your first available) Patient only has few pills left.

## 2019-11-11 NOTE — Telephone Encounter (Signed)
Patient states is calling to talk to [facility]

## 2019-11-12 NOTE — Telephone Encounter (Signed)
Appt scheduled

## 2019-11-12 NOTE — Telephone Encounter (Signed)
Returned patient's call and medications has been sent to pharmacy and scheduled appointment for follow-up with pcp has been done.

## 2019-11-26 DIAGNOSIS — Z23 Encounter for immunization: Secondary | ICD-10-CM | POA: Diagnosis not present

## 2019-12-09 ENCOUNTER — Other Ambulatory Visit: Payer: Self-pay | Admitting: Family Medicine

## 2019-12-29 ENCOUNTER — Telehealth (INDEPENDENT_AMBULATORY_CARE_PROVIDER_SITE_OTHER): Payer: Medicare Other | Admitting: Family Medicine

## 2019-12-29 ENCOUNTER — Encounter: Payer: Self-pay | Admitting: Family Medicine

## 2019-12-29 ENCOUNTER — Other Ambulatory Visit: Payer: Self-pay

## 2019-12-29 DIAGNOSIS — R7989 Other specified abnormal findings of blood chemistry: Secondary | ICD-10-CM

## 2019-12-29 DIAGNOSIS — R519 Headache, unspecified: Secondary | ICD-10-CM

## 2019-12-29 DIAGNOSIS — I10 Essential (primary) hypertension: Secondary | ICD-10-CM

## 2019-12-29 DIAGNOSIS — E785 Hyperlipidemia, unspecified: Secondary | ICD-10-CM | POA: Diagnosis not present

## 2019-12-29 DIAGNOSIS — M549 Dorsalgia, unspecified: Secondary | ICD-10-CM | POA: Diagnosis not present

## 2019-12-29 DIAGNOSIS — M545 Low back pain, unspecified: Secondary | ICD-10-CM | POA: Insufficient documentation

## 2019-12-29 DIAGNOSIS — I480 Paroxysmal atrial fibrillation: Secondary | ICD-10-CM | POA: Diagnosis not present

## 2019-12-29 MED ORDER — TRAMADOL HCL 50 MG PO TABS
50.0000 mg | ORAL_TABLET | Freq: Four times a day (QID) | ORAL | 0 refills | Status: DC | PRN
Start: 2019-12-29 — End: 2020-01-21

## 2019-12-29 NOTE — Assessment & Plan Note (Signed)
Monitor and report any concerns, no changes to meds. Encouraged heart healthy diet such as the DASH diet and exercise as tolerated.  ?

## 2019-12-29 NOTE — Assessment & Plan Note (Addendum)
Tolerating eliquis and rate controlled, she has mostly felt well since her ablation in September but starting this weekend she is feeling worn out and sob again. Her pulse is still staying in the 50s and 60s so she has not taken the prn Diltiazem but if her pulse goes higher she will. She will monitor her rhythm with her [facility] and report to cardiology if persists or worsens.

## 2019-12-29 NOTE — Assessment & Plan Note (Signed)
Minimize simple carbohydrates and processed foods and continue to monitor

## 2019-12-29 NOTE — Assessment & Plan Note (Signed)
Encouraged moist heat and gentle stretching as tolerated. May try NSAIDs and prescription meds as directed and report if symptoms worsen or seek immediate care. Has flared a little recently has used Tramadol with good results is allowed a refill today
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
## 2019-12-29 NOTE — Assessment & Plan Note (Signed)
Encouraged increased hydration, 64 ounces of clear fluids daily. Minimize alcohol and caffeine. Eat small frequent meals with lean proteins and complex carbs. Avoid high and low blood sugars. Get adequate sleep, 7-8 hours a night. Needs exercise daily preferably in the morning.  

## 2019-12-29 NOTE — Assessment & Plan Note (Signed)
encouraged heart healthy diet, avoid trans fats, minimize simple carbs and saturated fats. Increase exercise as tolerated 

## 2019-12-30 ENCOUNTER — Encounter (HOSPITAL_COMMUNITY): Payer: Self-pay

## 2020-01-13 ENCOUNTER — Ambulatory Visit: Payer: Medicare Other | Admitting: Cardiology

## 2020-01-19 ENCOUNTER — Emergency Department (HOSPITAL_BASED_OUTPATIENT_CLINIC_OR_DEPARTMENT_OTHER)
Admission: EM | Admit: 2020-01-19 | Discharge: 2020-01-19 | Disposition: A | Discharge status: Home / Self Care | Payer: Medicare Other | Attending: Emergency Medicine | Admitting: Emergency Medicine

## 2020-01-19 ENCOUNTER — Emergency Department (HOSPITAL_BASED_OUTPATIENT_CLINIC_OR_DEPARTMENT_OTHER): Payer: Medicare Other

## 2020-01-19 ENCOUNTER — Encounter (HOSPITAL_BASED_OUTPATIENT_CLINIC_OR_DEPARTMENT_OTHER): Payer: Self-pay

## 2020-01-19 ENCOUNTER — Other Ambulatory Visit: Payer: Self-pay

## 2020-01-19 DIAGNOSIS — M791 Myalgia, unspecified site: Secondary | ICD-10-CM | POA: Diagnosis not present

## 2020-01-19 DIAGNOSIS — Z7901 Long term (current) use of anticoagulants: Secondary | ICD-10-CM | POA: Diagnosis not present

## 2020-01-19 DIAGNOSIS — M419 Scoliosis, unspecified: Secondary | ICD-10-CM | POA: Diagnosis not present

## 2020-01-19 DIAGNOSIS — S199XXA Unspecified injury of neck, initial encounter: Secondary | ICD-10-CM | POA: Insufficient documentation

## 2020-01-19 DIAGNOSIS — Z9689 Presence of other specified functional implants: Secondary | ICD-10-CM | POA: Diagnosis not present

## 2020-01-19 DIAGNOSIS — Z96643 Presence of artificial hip joint, bilateral: Secondary | ICD-10-CM | POA: Diagnosis not present

## 2020-01-19 DIAGNOSIS — Z952 Presence of prosthetic heart valve: Secondary | ICD-10-CM | POA: Insufficient documentation

## 2020-01-19 DIAGNOSIS — M545 Low back pain, unspecified: Secondary | ICD-10-CM | POA: Insufficient documentation

## 2020-01-19 DIAGNOSIS — I110 Hypertensive heart disease with heart failure: Secondary | ICD-10-CM | POA: Insufficient documentation

## 2020-01-19 DIAGNOSIS — Y92018 Other place in single-family (private) house as the place of occurrence of the external cause: Secondary | ICD-10-CM | POA: Diagnosis not present

## 2020-01-19 DIAGNOSIS — M2550 Pain in unspecified joint: Secondary | ICD-10-CM | POA: Diagnosis not present

## 2020-01-19 DIAGNOSIS — Z79899 Other long term (current) drug therapy: Secondary | ICD-10-CM | POA: Insufficient documentation

## 2020-01-19 DIAGNOSIS — J0130 Acute sphenoidal sinusitis, unspecified: Secondary | ICD-10-CM | POA: Diagnosis not present

## 2020-01-19 DIAGNOSIS — J323 Chronic sphenoidal sinusitis: Secondary | ICD-10-CM | POA: Diagnosis not present

## 2020-01-19 DIAGNOSIS — J3489 Other specified disorders of nose and nasal sinuses: Secondary | ICD-10-CM | POA: Diagnosis not present

## 2020-01-19 DIAGNOSIS — I5032 Chronic diastolic (congestive) heart failure: Secondary | ICD-10-CM | POA: Insufficient documentation

## 2020-01-19 DIAGNOSIS — Z86718 Personal history of other venous thrombosis and embolism: Secondary | ICD-10-CM | POA: Diagnosis not present

## 2020-01-19 DIAGNOSIS — M25512 Pain in left shoulder: Secondary | ICD-10-CM

## 2020-01-19 DIAGNOSIS — I2510 Atherosclerotic heart disease of native coronary artery without angina pectoris: Secondary | ICD-10-CM | POA: Diagnosis not present

## 2020-01-19 DIAGNOSIS — W1789XA Other fall from one level to another, initial encounter: Secondary | ICD-10-CM | POA: Diagnosis not present

## 2020-01-19 DIAGNOSIS — I700 Atherosclerosis of aorta: Secondary | ICD-10-CM | POA: Diagnosis not present

## 2020-01-19 DIAGNOSIS — R079 Chest pain, unspecified: Secondary | ICD-10-CM | POA: Diagnosis not present

## 2020-01-19 MED ORDER — OXYCODONE HCL 5 MG PO TABS
2.5000 mg | ORAL_TABLET | Freq: Once | ORAL | Status: AC
  Administered 2020-01-19: 14:00:00 2.5 mg via ORAL
  Filled 2020-01-19: qty 1

## 2020-01-19 MED ORDER — DICLOFENAC SODIUM 1 % EX GEL: 4.0000 g | Freq: Four times a day (QID) | CUTANEOUS | 0 refills | Status: DC

## 2020-01-19 MED ORDER — ACETAMINOPHEN 500 MG PO TABS
1000.0000 mg | ORAL_TABLET | Freq: Once | ORAL | Status: AC
  Administered 2020-01-19: 13:00:00 1000 mg via ORAL
  Filled 2020-01-19: qty 2

## 2020-01-19 NOTE — TOC Initial Note (Addendum)
Transition of Care [facility]) - Initial/Assessment Note    Patient Details  Name: Mikayla [name] MRN: [id number] Date of Birth: November 15, 1940  Transition of [facility] CM/SW Contact:    Erenest Rasher, RN Phone Number: [phone] 01/19/2020, 3:11 PM  Clinical Narrative:                 TOC contacted pt and states she lives with adult dtr. States she was independent at home until fall. Offered choice for [facility]. Pt agreeable to [facility] agency that has a reasonable start of care. Most agencies are 4-6 days out. Contacted [facility] with new referral. Waiting call back.   01/19/2020 4 pm Received call from [facility] and they can accept referral with soc within 1-2 days. Will contact pt to make her aware.   01/20/2020 145 pm Spoke to pt and states she did receive call from [facility] and they left message. She just got in from her Cardiologist appt. Dtr at home to assist patient.   Expected Discharge Plan: [place] Barriers to Discharge: No Barriers Identified   Patient Goals and CMS Choice Patient states their goals for this hospitalization and ongoing recovery are:: prefers to go home with Home Health CMS Medicare.gov Compare Post Acute Care list provided to:: Patient Choice offered to / list presented to : Patient  Expected Discharge Plan and Services Expected Discharge Plan: [place] In-house Referral: [facility] Discharge Planning Services: CM Consult Post Acute Care Choice: [place] arrangements for the past 2 months: Single Family Home                                      Prior Living Arrangements/Services Living arrangements for the past 2 months: Single Family Home Lives with:: Adult Children Patient language and need for interpreter reviewed:: Yes Do you feel safe going back to the place where you live?: Yes      Need for Family Participation in Patient Care: Yes (Comment) Care giver support system in  place?: Yes (comment)   Criminal Activity/Legal Involvement Pertinent to Current Situation/Hospitalization: No - Comment as needed  Activities of Daily Living      Permission Sought/Granted Permission sought to share information with : Case Manager,Facility Contact Representative,PCP,Family Supports Permission granted to share information with : Yes, Verbal Permission Granted  Share Information with NAME: Parke Simmers  Permission granted to share info w AGENCY: [place] granted to share info w Relationship: Daughter  Permission granted to share info w Contact Information: [phone]  Emotional Assessment       Orientation: : Oriented to Self,Oriented to Place,Oriented to  Time,Oriented to Situation   Psych Involvement: No (comment)  Admission diagnosis:  FALL/  LEFT SHOULDER PAIN Patient Active Problem List   Diagnosis Date Noted  . Headache 12/29/2019  . Back pain 12/29/2019  . Edema 12/01/2018  . Acute pancreatitis after endoscopic retrograde cholangiopancreatography (ERCP) 11/02/2018  . Chronic diastolic HF (heart failure) ([place]) 11/02/2018  . Abnormal MRI of abdomen 11/01/2018  . Choledocholithiasis 11/01/2018  . Elevated LFTs 11/01/2018  . Paroxysmal atrial fibrillation ([place]) 09/23/2018  . Tachycardia-bradycardia ([place]) 07/17/2018  . Oral ulcer 07/01/2018  . Educated about COVID-19 virus infection 05/28/2018  . 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 Hyperlipidemia   . Macular degeneration   . Severe aortic stenosis   .  Brain aneurysm 01/16/2015  . History of DVT (deep vein thrombosis) 08/14/2014  . GERD (gastroesophageal reflux disease)   . Benign essential HTN 04/17/2006  . Chronic venous insufficiency 05/21/2002   PCP:  Mosie Lukes, MD Pharmacy:   [facility] [place], [address] [place] [address] Phone: [phone] Fax:  [phone]     Social Determinants of Health (SDOH) Interventions    Readmission Risk Interventions No flowsheet data found.

## 2020-01-19 NOTE — ED Notes (Signed)
Pt placed in room in gown, still in wheelchair, pt requesting to stay in wheelchair at this time. Reports taking tylenol and Tramadol at home for pain with minimal relief.

## 2020-01-19 NOTE — ED Notes (Signed)
Pt speaking with social worker on the phone about setting up home services.
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
## 2020-01-19 NOTE — ED Notes (Signed)
ED provider made aware of patients BP prior to DC, advises to give pain medication and have patient have BP rechecked at her already scheduled appointment tomorrow. Patient agrees, driven home by daughter.

## 2020-01-19 NOTE — Discharge Instructions (Addendum)
Take tylenol 1000mg (2 extra strength) four times a day.   Use the gel as prescribed.  Follow up with your family doc.

## 2020-01-19 NOTE — ED Notes (Signed)
Patient transported to X-ray 

## 2020-01-19 NOTE — ED Triage Notes (Addendum)
Pt reports L shoulder pain after a mechanical fall on 12/4. Pt states she landed on her L shoulder and the pain has not gotten any better. Pt denies hitting her head but is on blood thinners.

## 2020-01-20 ENCOUNTER — Encounter: Payer: Self-pay | Admitting: Cardiology

## 2020-01-20 ENCOUNTER — Ambulatory Visit (INDEPENDENT_AMBULATORY_CARE_PROVIDER_SITE_OTHER): Payer: Medicare Other | Admitting: Cardiology

## 2020-01-20 ENCOUNTER — Telehealth: Payer: Self-pay | Admitting: Family Medicine

## 2020-01-20 VITALS — BP 140/80 | HR 66 | Ht 65.0 in | Wt 176.0 lb

## 2020-01-20 DIAGNOSIS — I480 Paroxysmal atrial fibrillation: Secondary | ICD-10-CM
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
## 2020-01-20 NOTE — Telephone Encounter (Signed)
Patient states she fell last week on her shoulder and was seen in the emergency room yesterday and they asked her to follow up with her pcp. Patient states she would like to know if she can take  traMADol (ULTRAM) 50 MG tablet [[id number]] for her pain   Please Advise

## 2020-01-20 NOTE — Patient Instructions (Signed)
Medication Instructions:  Your physician recommends that you continue on your current medications as directed. Please refer to the Current Medication list given to you today.  *If you need a refill on your cardiac medications before your next appointment, please call your pharmacy*   Lab Work: None ordered   Testing/Procedures: None ordered   Follow-Up: At [facility], you and your health needs are our priority.  As part of our continuing mission to provide you with exceptional heart care, we have created designated Provider Care Teams.  These Care Teams include your primary Cardiologist (physician) and Advanced Practice Providers (APPs -  Physician Assistants and Nurse Practitioners) who all work together to provide you with the care you need, when you need it.  Your next appointment:   3 month(s)  The format for your next appointment:   In Person  Provider:   Will Camnitz, MD    Thank you for choosing [facility]!!   Devantae Babe, RN [phone]     

## 2020-01-21 ENCOUNTER — Other Ambulatory Visit: Payer: Self-pay | Admitting: Family Medicine

## 2020-01-21 MED ORDER — TRAMADOL HCL 50 MG PO TABS: 50.0000 mg | ORAL_TABLET | Freq: Four times a day (QID) | ORAL | 0 refills | Status: DC | PRN

## 2020-01-21 NOTE — Telephone Encounter (Signed)
I have sent a Tramadol refill but if she does not get better over the next 1-2 weeks she will need to be seen.

## 2020-01-21 NOTE — Telephone Encounter (Signed)
Patient notified and that rx was sent in.

## 2020-01-27 ENCOUNTER — Telehealth: Payer: Self-pay | Admitting: Family Medicine

## 2020-01-27 DIAGNOSIS — R7401 Elevation of levels of liver transaminase levels: Secondary | ICD-10-CM | POA: Diagnosis not present

## 2020-01-27 DIAGNOSIS — H3530 Unspecified macular degeneration: Secondary | ICD-10-CM | POA: Diagnosis not present

## 2020-01-27 DIAGNOSIS — Z9181 History of falling: Secondary | ICD-10-CM | POA: Diagnosis not present

## 2020-01-27 DIAGNOSIS — Z7901 Long term (current) use of anticoagulants: Secondary | ICD-10-CM | POA: Diagnosis not present

## 2020-01-27 DIAGNOSIS — M549 Dorsalgia, unspecified: Secondary | ICD-10-CM | POA: Diagnosis not present

## 2020-01-27 DIAGNOSIS — K219 Gastro-esophageal reflux disease without esophagitis: Secondary | ICD-10-CM | POA: Diagnosis not present

## 2020-01-27 DIAGNOSIS — R519 Headache, unspecified: Secondary | ICD-10-CM | POA: Diagnosis not present

## 2020-01-27 DIAGNOSIS — I671 Cerebral aneurysm, nonruptured: Secondary | ICD-10-CM | POA: Diagnosis not present

## 2020-01-27 DIAGNOSIS — I110 Hypertensive heart disease with heart failure: Secondary | ICD-10-CM | POA: Diagnosis not present

## 2020-01-27 DIAGNOSIS — I5032 Chronic diastolic (congestive) heart failure: Secondary | ICD-10-CM | POA: Diagnosis not present

## 2020-01-27 DIAGNOSIS — Z86718 Personal history of other venous thrombosis and embolism: Secondary | ICD-10-CM | POA: Diagnosis not present

## 2020-01-27 DIAGNOSIS — G8911 Acute pain due to trauma: Secondary | ICD-10-CM | POA: Diagnosis not present

## 2020-01-27 DIAGNOSIS — Z9689 Presence of other specified functional implants: Secondary | ICD-10-CM | POA: Diagnosis not present

## 2020-01-27 DIAGNOSIS — M47812 Spondylosis without myelopathy or radiculopathy, cervical region: Secondary | ICD-10-CM | POA: Diagnosis not present

## 2020-01-27 DIAGNOSIS — E785 Hyperlipidemia, unspecified: Secondary | ICD-10-CM | POA: Diagnosis not present

## 2020-01-27 DIAGNOSIS — I495 Sick sinus syndrome: Secondary | ICD-10-CM | POA: Diagnosis not present

## 2020-01-27 DIAGNOSIS — I480 Paroxysmal atrial fibrillation: Secondary | ICD-10-CM | POA: Diagnosis not present

## 2020-01-27 DIAGNOSIS — K859 Acute pancreatitis without necrosis or infection, unspecified: Secondary | ICD-10-CM | POA: Diagnosis not present

## 2020-01-27 DIAGNOSIS — I872 Venous insufficiency (chronic) (peripheral): Secondary | ICD-10-CM | POA: Diagnosis not present

## 2020-01-27 DIAGNOSIS — Z79891 Long term (current) use of opiate analgesic: Secondary | ICD-10-CM | POA: Diagnosis not present

## 2020-01-27 DIAGNOSIS — M25512 Pain in left shoulder: Secondary | ICD-10-CM | POA: Diagnosis not present

## 2020-01-27 DIAGNOSIS — Z952 Presence of prosthetic heart valve: Secondary | ICD-10-CM | POA: Diagnosis not present

## 2020-01-27 DIAGNOSIS — I739 Peripheral vascular disease, unspecified: Secondary | ICD-10-CM | POA: Diagnosis not present

## 2020-01-27 DIAGNOSIS — K8050 Calculus of bile duct without cholangitis or cholecystitis without obstruction: Secondary | ICD-10-CM | POA: Diagnosis not present

## 2020-01-27 DIAGNOSIS — I700 Atherosclerosis of aorta: Secondary | ICD-10-CM | POA: Diagnosis not present

## 2020-01-27 NOTE — Telephone Encounter (Signed)
OK to give VO as requested 

## 2020-01-27 NOTE — Telephone Encounter (Signed)
Looks like patient went to ED for a fall.  New order from [facility].

## 2020-01-27 NOTE — Telephone Encounter (Signed)
Caller/Agency: [facility] Number: [phone]  Requesting OT Frequency:  2x1    1x1  Patient declined nurse and social referral, Patient's ot will begin tomorrow.

## 2020-01-28 ENCOUNTER — Telehealth: Payer: Self-pay | Admitting: Family Medicine

## 2020-01-28 MED ORDER — FAMCICLOVIR 500 MG PO TABS: 500.0000 mg | ORAL_TABLET | Freq: Two times a day (BID) | ORAL | 1 refills | Status: AC

## 2020-01-28 NOTE — Telephone Encounter (Signed)
Medication: famciclovir (FAMVIR) 500 MG tablet [[id number]   Has the patient contacted their pharmacy? No. (If no, request that the patient contact the pharmacy for the refill.) (If yes, when and what did the pharmacy advise?)  Preferred Pharmacy (with phone number or street name): [facility] [place], [place] - [address]  [address], [place] [address]  Phone:  [phone] Fax:  [phone]  DEA #:  --  Agent: Please be advised that RX refills may take up to 3 business days. We ask that you follow-up with your pharmacy.

## 2020-01-28 NOTE — Telephone Encounter (Signed)
Patient notified that rx has been sent in. 

## 2020-01-28 NOTE — Telephone Encounter (Signed)
Verbal orders given.  Roselie Awkward stated that is for PT not OT.

## 2020-01-30 DIAGNOSIS — G8911 Acute pain due to trauma: Secondary | ICD-10-CM | POA: Diagnosis not present

## 2020-01-30 DIAGNOSIS — I480 Paroxysmal atrial fibrillation: Secondary | ICD-10-CM | POA: Diagnosis not present

## 2020-01-30 DIAGNOSIS — I5032 Chronic diastolic (congestive) heart failure: Secondary | ICD-10-CM | POA: Diagnosis not present

## 2020-01-30 DIAGNOSIS — M47812 Spondylosis without myelopathy or radiculopathy, cervical region: Secondary | ICD-10-CM | POA: Diagnosis not present

## 2020-01-30 DIAGNOSIS — I110 Hypertensive heart disease with heart failure: Secondary | ICD-10-CM | POA: Diagnosis not present

## 2020-01-30 DIAGNOSIS — M25512 Pain in left shoulder: Secondary | ICD-10-CM | POA: Diagnosis not present

## 2020-02-02 ENCOUNTER — Other Ambulatory Visit (INDEPENDENT_AMBULATORY_CARE_PROVIDER_SITE_OTHER): Payer: Medicare Other

## 2020-02-02 ENCOUNTER — Other Ambulatory Visit: Payer: Self-pay

## 2020-02-02 ENCOUNTER — Ambulatory Visit: Payer: Self-pay | Admitting: *Deleted

## 2020-02-02 DIAGNOSIS — E785 Hyperlipidemia, unspecified: Secondary | ICD-10-CM | POA: Diagnosis not present

## 2020-02-02 DIAGNOSIS — R7989 Other specified abnormal findings of blood chemistry: Secondary | ICD-10-CM | POA: Diagnosis not present

## 2020-02-02 DIAGNOSIS — I10 Essential (primary) hypertension: Secondary | ICD-10-CM

## 2020-02-02 LAB — CBC
HCT: 37.4 % (ref 36.0–46.0)
Hemoglobin: 12.3 g/dL (ref 12.0–15.0)
MCHC: 32.9 g/dL (ref 30.0–36.0)
MCV: 92.2 fl (ref 78.0–100.0)
Platelets: 204 10*3/uL (ref 150.0–400.0)
RBC: 4.05 Mil/uL (ref 3.87–5.11)
RDW: 14.5 % (ref 11.5–15.5)
WBC: 6.9 10*3/uL (ref 4.0–10.5)

## 2020-02-02 LAB — COMPREHENSIVE METABOLIC PANEL
ALT: 11 U/L (ref 0–35)
AST: 16 U/L (ref 0–37)
Albumin: 3.9 g/dL (ref 3.5–5.2)
Alkaline Phosphatase: 74 U/L (ref 39–117)
BUN: 19 mg/dL (ref 6–23)
CO2: 30 mEq/L (ref 19–32)
Calcium: 8.9 mg/dL (ref 8.4–10.5)
Chloride: 104 mEq/L (ref 96–112)
Creatinine, Ser: 0.61 mg/dL (ref 0.40–1.20)
GFR: 83.92 mL/min (ref >60.00)
Glucose, Bld: 93 mg/dL (ref 70–99)
Potassium: 4.5 mEq/L (ref 3.5–5.1)
Sodium: 140 mEq/L (ref 135–145)
Total Bilirubin: 0.6 mg/dL (ref 0.2–1.2)
Total Protein: 6.3 g/dL (ref 6.0–8.3)

## 2020-02-02 LAB — LIPID PANEL
Cholesterol: 187 mg/dL (ref 0–200)
HDL: 52 mg/dL (ref >39.00)
LDL Cholesterol: 110 mg/dL — ABNORMAL HIGH (ref 0–99)
NonHDL: 135.03
Total CHOL/HDL Ratio: 4
Triglycerides: 123 mg/dL (ref 0.0–149.0)
VLDL: 24.6 mg/dL (ref 0.0–40.0)

## 2020-02-02 LAB — TSH: TSH: 1.26 u[IU]/mL (ref 0.35–4.50)

## 2020-02-03 DIAGNOSIS — M47812 Spondylosis without myelopathy or radiculopathy, cervical region: Secondary | ICD-10-CM | POA: Diagnosis not present

## 2020-02-03 DIAGNOSIS — M25512 Pain in left shoulder: Secondary | ICD-10-CM | POA: Diagnosis not present

## 2020-02-03 DIAGNOSIS — G8911 Acute pain due to trauma: Secondary | ICD-10-CM | POA: Diagnosis not present

## 2020-02-03 DIAGNOSIS — I5032 Chronic diastolic (congestive) heart failure: Secondary | ICD-10-CM | POA: Diagnosis not present

## 2020-02-03 DIAGNOSIS — I110 Hypertensive heart disease with heart failure: Secondary | ICD-10-CM | POA: Diagnosis not present

## 2020-02-03 DIAGNOSIS — I480 Paroxysmal atrial fibrillation: Secondary | ICD-10-CM | POA: Diagnosis not present

## 2020-02-06 ENCOUNTER — Other Ambulatory Visit: Payer: Self-pay | Admitting: Family Medicine

## 2020-02-10 DIAGNOSIS — M47812 Spondylosis without myelopathy or radiculopathy, cervical region: Secondary | ICD-10-CM | POA: Diagnosis not present

## 2020-02-10 DIAGNOSIS — I480 Paroxysmal atrial fibrillation: Secondary | ICD-10-CM | POA: Diagnosis not present

## 2020-02-10 DIAGNOSIS — I5032 Chronic diastolic (congestive) heart failure: Secondary | ICD-10-CM | POA: Diagnosis not present

## 2020-02-10 DIAGNOSIS — I110 Hypertensive heart disease with heart failure: Secondary | ICD-10-CM | POA: Diagnosis not present

## 2020-02-10 DIAGNOSIS — G8911 Acute pain due to trauma: Secondary | ICD-10-CM | POA: Diagnosis not present

## 2020-02-10 DIAGNOSIS — M25512 Pain in left shoulder: Secondary | ICD-10-CM | POA: Diagnosis not present

## 2020-02-12 ENCOUNTER — Inpatient Hospital Stay: Payer: Medicare Other | Admitting: Family Medicine

## 2020-02-12 DIAGNOSIS — M47812 Spondylosis without myelopathy or radiculopathy, cervical region: Secondary | ICD-10-CM | POA: Diagnosis not present

## 2020-02-12 DIAGNOSIS — I480 Paroxysmal atrial fibrillation: Secondary | ICD-10-CM | POA: Diagnosis not present

## 2020-02-12 DIAGNOSIS — G8911 Acute pain due to trauma: Secondary | ICD-10-CM | POA: Diagnosis not present

## 2020-02-12 DIAGNOSIS — I110 Hypertensive heart disease with heart failure: Secondary | ICD-10-CM | POA: Diagnosis not present

## 2020-02-12 DIAGNOSIS — I5032 Chronic diastolic (congestive) heart failure: Secondary | ICD-10-CM | POA: Diagnosis not present

## 2020-02-12 DIAGNOSIS — M25512 Pain in left shoulder: Secondary | ICD-10-CM | POA: Diagnosis not present

## 2020-02-17 DIAGNOSIS — I480 Paroxysmal atrial fibrillation: Secondary | ICD-10-CM | POA: Diagnosis not present

## 2020-02-17 DIAGNOSIS — I110 Hypertensive heart disease with heart failure: Secondary | ICD-10-CM | POA: Diagnosis not present

## 2020-02-17 DIAGNOSIS — M47812 Spondylosis without myelopathy or radiculopathy, cervical region: Secondary | ICD-10-CM | POA: Diagnosis not present

## 2020-02-17 DIAGNOSIS — G8911 Acute pain due to trauma: Secondary | ICD-10-CM | POA: Diagnosis not present

## 2020-02-17 DIAGNOSIS — M25512 Pain in left shoulder: Secondary | ICD-10-CM | POA: Diagnosis not present

## 2020-02-17 DIAGNOSIS — I5032 Chronic diastolic (congestive) heart failure: Secondary | ICD-10-CM | POA: Diagnosis not present
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
## 2020-02-19 ENCOUNTER — Ambulatory Visit (INDEPENDENT_AMBULATORY_CARE_PROVIDER_SITE_OTHER): Payer: Medicare Other

## 2020-02-19 VITALS — BP 120/74 | Ht 64.0 in | Wt 174.0 lb

## 2020-02-19 DIAGNOSIS — Z0000 Encounter for general adult medical examination without abnormal findings: Secondary | ICD-10-CM

## 2020-02-19 NOTE — Patient Instructions (Signed)
[name] , Thank you for taking time to complete your Medicare Wellness Visit. I appreciate your ongoing commitment to your health goals. Please review the following plan we discussed and let me know if I can assist you in the future.   Screening recommendations/referrals: Colonoscopy: No longer required Mammogram: Declined at this time. Please call the office to schedule if you change your mind. Bone Density: Declined at this time. Please call the office to schedule if you change your mind. Recommended yearly ophthalmology/optometry visit for glaucoma screening and checkup Recommended yearly dental visit for hygiene and checkup  Vaccinations: Influenza vaccine: Up to date Pneumococcal vaccine: Completed vaccines Tdap vaccine: Up to date-Due-12/06/2021 Shingles vaccine: Discuss with pharmacy   Covid-19:Completed vaccines  Advanced directives: Copy in chart  Conditions/risks identified: See problem list  Next appointment: Follow up in one year for your annual wellness visit    Preventive Care 65 Years and Older, Female Preventive care refers to lifestyle choices and visits with your health care provider that can promote health and wellness. What does preventive care include?  A yearly physical exam. This is also called an annual well check.  Dental exams once or twice a year.  Routine eye exams. Ask your health care provider how often you should have your eyes checked.  Personal lifestyle choices, including:  Daily care of your teeth and gums.  Regular physical activity.  Eating a healthy diet.  Avoiding tobacco and drug use.  Limiting alcohol use.  Practicing safe sex.  Taking low-dose aspirin every day.  Taking vitamin and mineral supplements as recommended by your health care provider. What happens during an annual well check? The services and screenings done by your health care provider during your annual well check will depend on your age, overall health,  lifestyle risk factors, and family history of disease. Counseling  Your health care provider may ask you questions about your:  Alcohol use.  Tobacco use.  Drug use.  Emotional well-being.  Home and relationship well-being.  Sexual activity.  Eating habits.  History of falls.  Memory and ability to understand (cognition).  Work and work Statistician.  Reproductive health. Screening  You may have the following tests or measurements:  Height, weight, and BMI.  Blood pressure.  Lipid and cholesterol levels. These may be checked every 5 years, or more frequently if you are over 24 years old.  Skin check.  Lung cancer screening. You may have this screening every year starting at age 21 if you have a 30-pack-year history of smoking and currently smoke or have quit within the past 15 years.  Fecal occult blood test (FOBT) of the stool. You may have this test every year starting at age 56.  Flexible sigmoidoscopy or colonoscopy. You may have a sigmoidoscopy every 5 years or a colonoscopy every 10 years starting at age 60.  Hepatitis C blood test.  Hepatitis B blood test.  Sexually transmitted disease (STD) testing.  Diabetes screening. This is done by checking your blood sugar (glucose) after you have not eaten for a while (fasting). You may have this done every 1-3 years.  Bone density scan. This is done to screen for osteoporosis. You may have this done starting at age 32.  Mammogram. This may be done every 1-2 years. Talk to your health care provider about how often you should have regular mammograms. Talk with your health care provider about your test results, treatment options, and if necessary, the need for more tests. Vaccines  Your  health care provider may recommend certain vaccines, such as:  Influenza vaccine. This is recommended every year.  Tetanus, diphtheria, and acellular pertussis (Tdap, Td) vaccine. You may need a Td booster every 10 years.  Zoster  vaccine. You may need this after age 62.  Pneumococcal 13-valent conjugate (PCV13) vaccine. One dose is recommended after age 56.  Pneumococcal polysaccharide (PPSV23) vaccine. One dose is recommended after age 68. Talk to your health care provider about which screenings and vaccines you need and how often you need them. This information is not intended to replace advice given to you by your health care provider. Make sure you discuss any questions you have with your health care provider. Document Released: 02/19/2015 Document Revised: 10/13/2015 Document Reviewed: 11/24/2014 Elsevier Interactive Patient Education  2017 [place] Prevention in the Home Falls can cause injuries. They can happen to people of all ages. There are many things you can do to make your home safe and to help prevent falls. What can I do on the outside of my home?  Regularly fix the edges of walkways and driveways and fix any cracks.  Remove anything that might make you trip as you walk through a door, such as a raised step or threshold.  Trim any bushes or trees on the path to your home.  Use bright outdoor lighting.  Clear any walking paths of anything that might make someone trip, such as rocks or tools.  Regularly check to see if handrails are loose or broken. Make sure that both sides of any steps have handrails.  Any raised decks and porches should have guardrails on the edges.  Have any leaves, snow, or ice cleared regularly.  Use sand or salt on walking paths during winter.  Clean up any spills in your garage right away. This includes oil or grease spills. What can I do in the bathroom?  Use night lights.  Install grab bars by the toilet and in the tub and shower. Do not use towel bars as grab bars.  Use non-skid mats or decals in the tub or shower.  If you need to sit down in the shower, use a plastic, non-slip stool.  Keep the floor dry. Clean up any water that spills on the  floor as soon as it happens.  Remove soap buildup in the tub or shower regularly.  Attach bath mats securely with double-sided non-slip rug tape.  Do not have throw rugs and other things on the floor that can make you trip. What can I do in the bedroom?  Use night lights.  Make sure that you have a light by your bed that is easy to reach.  Do not use any sheets or blankets that are too big for your bed. They should not hang down onto the floor.  Have a firm chair that has side arms. You can use this for support while you get dressed.  Do not have throw rugs and other things on the floor that can make you trip. What can I do in the kitchen?  Clean up any spills right away.  Avoid walking on wet floors.  Keep items that you use a lot in easy-to-reach places.  If you need to reach something above you, use a strong step stool that has a grab bar.  Keep electrical cords out of the way.  Do not use floor polish or wax that makes floors slippery. If you must use wax, use non-skid floor wax.  Do not  have throw rugs and other things on the floor that can make you trip. What can I do with my stairs?  Do not leave any items on the stairs.  Make sure that there are handrails on both sides of the stairs and use them. Fix handrails that are broken or loose. Make sure that handrails are as long as the stairways.  Check any carpeting to make sure that it is firmly attached to the stairs. Fix any carpet that is loose or worn.  Avoid having throw rugs at the top or bottom of the stairs. If you do have throw rugs, attach them to the floor with carpet tape.  Make sure that you have a light switch at the top of the stairs and the bottom of the stairs. If you do not have them, ask someone to add them for you. What else can I do to help prevent falls?  Wear shoes that:  Do not have high heels.  Have rubber bottoms.  Are comfortable and fit you well.  Are closed at the toe. Do not wear  sandals.  If you use a stepladder:  Make sure that it is fully opened. Do not climb a closed stepladder.  Make sure that both sides of the stepladder are locked into place.  Ask someone to hold it for you, if possible.  Clearly mark and make sure that you can see:  Any grab bars or handrails.  First and last steps.  Where the edge of each step is.  Use tools that help you move around (mobility aids) if they are needed. These include:  Canes.  Walkers.  Scooters.  Crutches.  Turn on the lights when you go into a dark area. Replace any light bulbs as soon as they burn out.  Set up your furniture so you have a clear path. Avoid moving your furniture around.  If any of your floors are uneven, fix them.  If there are any pets around you, be aware of where they are.  Review your medicines with your doctor. Some medicines can make you feel dizzy. This can increase your chance of falling. Ask your doctor what other things that you can do to help prevent falls. This information is not intended to replace advice given to you by your health care provider. Make sure you discuss any questions you have with your health care provider. Document Released: 11/19/2008 Document Revised: 07/01/2015 Document Reviewed: 02/27/2014 Elsevier Interactive Patient Education  2017 [employer].

## 2020-02-24 DIAGNOSIS — M47812 Spondylosis without myelopathy or radiculopathy, cervical region: Secondary | ICD-10-CM | POA: Diagnosis not present

## 2020-02-24 DIAGNOSIS — I110 Hypertensive heart disease with heart failure: Secondary | ICD-10-CM | POA: Diagnosis not present

## 2020-02-24 DIAGNOSIS — I480 Paroxysmal atrial fibrillation: Secondary | ICD-10-CM | POA: Diagnosis not present

## 2020-02-24 DIAGNOSIS — M25512 Pain in left shoulder: Secondary | ICD-10-CM | POA: Diagnosis not present

## 2020-02-24 DIAGNOSIS — I5032 Chronic diastolic (congestive) heart failure: Secondary | ICD-10-CM | POA: Diagnosis not present

## 2020-02-24 DIAGNOSIS — G8911 Acute pain due to trauma: Secondary | ICD-10-CM | POA: Diagnosis not present

## 2020-02-26 DIAGNOSIS — M47812 Spondylosis without myelopathy or radiculopathy, cervical region: Secondary | ICD-10-CM | POA: Diagnosis not present

## 2020-02-26 DIAGNOSIS — I671 Cerebral aneurysm, nonruptured: Secondary | ICD-10-CM | POA: Diagnosis not present

## 2020-02-26 DIAGNOSIS — M25512 Pain in left shoulder: Secondary | ICD-10-CM | POA: Diagnosis not present

## 2020-02-26 DIAGNOSIS — Z9689 Presence of other specified functional implants: Secondary | ICD-10-CM | POA: Diagnosis not present

## 2020-02-26 DIAGNOSIS — M549 Dorsalgia, unspecified: Secondary | ICD-10-CM | POA: Diagnosis not present

## 2020-02-26 DIAGNOSIS — R7401 Elevation of levels of liver transaminase levels: Secondary | ICD-10-CM | POA: Diagnosis not present

## 2020-02-26 DIAGNOSIS — H3530 Unspecified macular degeneration: Secondary | ICD-10-CM | POA: Diagnosis not present

## 2020-02-26 DIAGNOSIS — I110 Hypertensive heart disease with heart failure: Secondary | ICD-10-CM | POA: Diagnosis not present

## 2020-02-26 DIAGNOSIS — I5032 Chronic diastolic (congestive) heart failure: Secondary | ICD-10-CM | POA: Diagnosis not present

## 2020-02-26 DIAGNOSIS — K219 Gastro-esophageal reflux disease without esophagitis: Secondary | ICD-10-CM | POA: Diagnosis not present

## 2020-02-26 DIAGNOSIS — I872 Venous insufficiency (chronic) (peripheral): Secondary | ICD-10-CM | POA: Diagnosis not present

## 2020-02-26 DIAGNOSIS — G8911 Acute pain due to trauma: Secondary | ICD-10-CM | POA: Diagnosis not present

## 2020-02-26 DIAGNOSIS — I495 Sick sinus syndrome: Secondary | ICD-10-CM | POA: Diagnosis not present

## 2020-02-26 DIAGNOSIS — I700 Atherosclerosis of aorta: Secondary | ICD-10-CM | POA: Diagnosis not present

## 2020-02-26 DIAGNOSIS — Z79891 Long term (current) use of opiate analgesic: Secondary | ICD-10-CM | POA: Diagnosis not present

## 2020-02-26 DIAGNOSIS — I739 Peripheral vascular disease, unspecified: Secondary | ICD-10-CM | POA: Diagnosis not present

## 2020-02-26 DIAGNOSIS — Z86718 Personal history of other venous thrombosis and embolism: Secondary | ICD-10-CM | POA: Diagnosis not present

## 2020-02-26 DIAGNOSIS — E785 Hyperlipidemia, unspecified: Secondary | ICD-10-CM | POA: Diagnosis not present

## 2020-02-26 DIAGNOSIS — R519 Headache, unspecified: Secondary | ICD-10-CM | POA: Diagnosis not present

## 2020-02-26 DIAGNOSIS — K8050 Calculus of bile duct without cholangitis or cholecystitis without obstruction: Secondary | ICD-10-CM | POA: Diagnosis not present

## 2020-02-26 DIAGNOSIS — K859 Acute pancreatitis without necrosis or infection, unspecified: Secondary | ICD-10-CM | POA: Diagnosis not present

## 2020-02-26 DIAGNOSIS — Z7901 Long term (current) use of anticoagulants: Secondary | ICD-10-CM | POA: Diagnosis not present

## 2020-02-26 DIAGNOSIS — I480 Paroxysmal atrial fibrillation: Secondary | ICD-10-CM | POA: Diagnosis not present

## 2020-02-26 DIAGNOSIS — Z952 Presence of prosthetic heart valve: Secondary | ICD-10-CM | POA: Diagnosis not present

## 2020-02-26 DIAGNOSIS — Z9181 History of falling: Secondary | ICD-10-CM | POA: Diagnosis not present

## 2020-03-01 DIAGNOSIS — H35363 Drusen (degenerative) of macula, bilateral: Secondary | ICD-10-CM | POA: Diagnosis not present

## 2020-03-01 DIAGNOSIS — H353131 Nonexudative age-related macular degeneration, bilateral, early dry stage: Secondary | ICD-10-CM | POA: Diagnosis not present

## 2020-03-01 DIAGNOSIS — H18513 Endothelial corneal dystrophy, bilateral: Secondary | ICD-10-CM | POA: Diagnosis not present

## 2020-03-02 ENCOUNTER — Other Ambulatory Visit: Payer: Self-pay | Admitting: *Deleted

## 2020-03-02 DIAGNOSIS — I5032 Chronic diastolic (congestive) heart failure: Secondary | ICD-10-CM | POA: Diagnosis not present

## 2020-03-02 DIAGNOSIS — M25512 Pain in left shoulder: Secondary | ICD-10-CM | POA: Diagnosis not present

## 2020-03-02 DIAGNOSIS — M47812 Spondylosis without myelopathy or radiculopathy, cervical region: Secondary | ICD-10-CM | POA: Diagnosis not present

## 2020-03-02 DIAGNOSIS — G8911 Acute pain due to trauma: Secondary | ICD-10-CM | POA: Diagnosis not present

## 2020-03-02 DIAGNOSIS — I480 Paroxysmal atrial fibrillation: Secondary | ICD-10-CM | POA: Diagnosis not present

## 2020-03-02 DIAGNOSIS — I110 Hypertensive heart disease with heart failure: Secondary | ICD-10-CM | POA: Diagnosis not present

## 2020-03-02 MED ORDER — APIXABAN 5 MG PO TABS: 5.0000 mg | ORAL_TABLET | Freq: Two times a day (BID) | ORAL | 1 refills | Status: DC

## 2020-03-02 NOTE — Telephone Encounter (Signed)
Prescription refill request for Eliquis received from [facility].   Indication: afib Last office visit: [place], 01/20/2020 Scr: 0. 61, 02/02/2020 Age:  80 yo  Weight: 78.9 kg   Prescription refill sent.

## 2020-03-06 ENCOUNTER — Other Ambulatory Visit: Payer: Self-pay | Admitting: Family Medicine

## 2020-05-04 ENCOUNTER — Other Ambulatory Visit: Payer: Self-pay

## 2020-05-04 ENCOUNTER — Ambulatory Visit (INDEPENDENT_AMBULATORY_CARE_PROVIDER_SITE_OTHER): Payer: Medicare Other | Admitting: Cardiology

## 2020-05-04 ENCOUNTER — Encounter: Payer: Self-pay | Admitting: Cardiology

## 2020-05-04 VITALS — BP 138/66 | HR 51 | Ht 65.0 in | Wt 174.0 lb

## 2020-05-04 DIAGNOSIS — I480 Paroxysmal atrial fibrillation: Secondary | ICD-10-CM

## 2020-05-04 NOTE — Patient Instructions (Addendum)
Medication Instructions:  Your physician recommends that you continue on your current medications as directed. Please refer to the Current Medication list given to you today.  *If you need a refill on your cardiac medications before your next appointment, please call your pharmacy*   Lab Work: None ordered   Testing/Procedures: None ordered   Follow-Up: At [facility], you and your health needs are our priority.  As part of our continuing mission to provide you with exceptional heart care, we have created designated Provider Care Teams.  These Care Teams include your primary Cardiologist (physician) and Advanced Practice Providers (APPs -  Physician Assistants and Nurse Practitioners) who all work together to provide you with the care you need, when you need it.  Your physician recommends that you schedule a follow-up appointment in: 3 months with Dr. Percival Spanish.   Your next appointment:   6 month(s)  The format for your next appointment:   In Person  Provider:   You may see one of the following Advanced Practice Providers on your designated Care Team:    Chanetta Marshall, NP  Tommye Standard, PA-C  Legrand Como "[place]" [place], [place]     Thank you for choosing [facility]!!   Trinidad Curet, RN [phone]
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
## 2020-05-10 ENCOUNTER — Other Ambulatory Visit: Payer: Self-pay

## 2020-05-11 ENCOUNTER — Ambulatory Visit (INDEPENDENT_AMBULATORY_CARE_PROVIDER_SITE_OTHER): Payer: Medicare Other | Admitting: Family Medicine

## 2020-05-11 ENCOUNTER — Encounter: Payer: Self-pay | Admitting: Family Medicine

## 2020-05-11 DIAGNOSIS — I10 Essential (primary) hypertension: Secondary | ICD-10-CM

## 2020-05-11 DIAGNOSIS — I350 Nonrheumatic aortic (valve) stenosis: Secondary | ICD-10-CM

## 2020-05-11 DIAGNOSIS — M25512 Pain in left shoulder: Secondary | ICD-10-CM | POA: Diagnosis not present

## 2020-05-11 DIAGNOSIS — I480 Paroxysmal atrial fibrillation: Secondary | ICD-10-CM | POA: Diagnosis not present

## 2020-05-11 DIAGNOSIS — K219 Gastro-esophageal reflux disease without esophagitis: Secondary | ICD-10-CM | POA: Diagnosis not present

## 2020-05-11 DIAGNOSIS — E785 Hyperlipidemia, unspecified: Secondary | ICD-10-CM | POA: Diagnosis not present

## 2020-05-11 NOTE — Patient Instructions (Signed)
Shingrix is the new shingles shot 2 shots over 2-6 months Preventive Care 65 Years and Older, Female Preventive care refers to lifestyle choices and visits with your health care provider that can promote health and wellness. This includes:  A yearly physical exam. This is also called an annual wellness visit.  Regular dental and eye exams.  Immunizations.  Screening for certain conditions.  Healthy lifestyle choices, such as: ? Eating a healthy diet. ? Getting regular exercise. ? Not using drugs or products that contain nicotine and tobacco. ? Limiting alcohol use. What can I expect for my preventive care visit? Physical exam Your health care provider will check your:  Height and weight. These may be used to calculate your BMI (body mass index). BMI is a measurement that tells if you are at a healthy weight.  Heart rate and blood pressure.  Body temperature.  Skin for abnormal spots. Counseling Your health care provider may ask you questions about your:  Past medical problems.  Family's medical history.  Alcohol, tobacco, and drug use.  Emotional well-being.  Home life and relationship well-being.  Sexual activity.  Diet, exercise, and sleep habits.  History of falls.  Memory and ability to understand (cognition).  Work and work Statistician.  Pregnancy and menstrual history.  Access to firearms. What immunizations do I need? Vaccines are usually given at various ages, according to a schedule. Your health care provider will recommend vaccines for you based on your age, medical history, and lifestyle or other factors, such as travel or where you work.   What tests do I need? Blood tests  Lipid and cholesterol levels. These may be checked every 5 years, or more often depending on your overall health.  Hepatitis C test.  Hepatitis B test. Screening  Lung cancer screening. You may have this screening every year starting at age 83 if you have a 30-pack-year  history of smoking and currently smoke or have quit within the past 15 years.  Colorectal cancer screening. ? All adults should have this screening starting at age 7 and continuing until age 78. ? Your health care provider may recommend screening at age 34 if you are at increased risk. ? You will have tests every 1-10 years, depending on your results and the type of screening test.  Diabetes screening. ? This is done by checking your blood sugar (glucose) after you have not eaten for a while (fasting). ? You may have this done every 1-3 years.  Mammogram. ? This may be done every 1-2 years. ? Talk with your health care provider about how often you should have regular mammograms.  Abdominal aortic aneurysm (AAA) screening. You may need this if you are a current or former smoker.  BRCA-related cancer screening. This may be done if you have a family history of breast, ovarian, tubal, or peritoneal cancers. Other tests  STD (sexually transmitted disease) testing, if you are at risk.  Bone density scan. This is done to screen for osteoporosis. You may have this done starting at age 29. Talk with your health care provider about your test results, treatment options, and if necessary, the need for more tests. Follow these instructions at home: Eating and drinking  Eat a diet that includes fresh fruits and vegetables, whole grains, lean protein, and low-fat dairy products. Limit your intake of foods with high amounts of sugar, saturated fats, and salt.  Take vitamin and mineral supplements as recommended by your health care provider.  Do not drink alcohol  if your health care provider tells you not to drink.  If you drink alcohol: ? Limit how much you have to 0-1 drink a day. ? Be aware of how much alcohol is in your drink. In the [place] one drink equals one 12 oz bottle of beer (355 mL), one 5 oz glass of wine (148 mL), or one 1 oz glass of hard liquor (44 mL).   Lifestyle  Take daily  care of your teeth and gums. Brush your teeth every morning and night with fluoride toothpaste. Floss one time each day.  Stay active. Exercise for at least 30 minutes 5 or more days each week.  Do not use any products that contain nicotine or tobacco, such as cigarettes, e-cigarettes, and chewing tobacco. If you need help quitting, ask your health care provider.  Do not use drugs.  If you are sexually active, practice safe sex. Use a condom or other form of protection in order to prevent STIs (sexually transmitted infections).  Talk with your health care provider about taking a low-dose aspirin or statin.  Find healthy ways to cope with stress, such as: ? Meditation, yoga, or listening to music. ? Journaling. ? Talking to a trusted person. ? Spending time with friends and family. Safety  Always wear your seat belt while driving or riding in a vehicle.  Do not drive: ? If you have been drinking alcohol. Do not ride with someone who has been drinking. ? When you are tired or distracted. ? While texting.  Wear a helmet and other protective equipment during sports activities.  If you have firearms in your house, make sure you follow all gun safety procedures. What's next?  Visit your health care provider once a year for an annual wellness visit.  Ask your health care provider how often you should have your eyes and teeth checked.  Stay up to date on all vaccines. This information is not intended to replace advice given to you by your health care provider. Make sure you discuss any questions you have with your health care provider. Document Revised: 01/14/2020 Document Reviewed: 01/17/2018 Elsevier Patient Education  2021 [employer].
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
## 2020-05-16 DIAGNOSIS — M25512 Pain in left shoulder: Secondary | ICD-10-CM | POA: Insufficient documentation

## 2020-05-16 NOTE — Assessment & Plan Note (Signed)
Well controlled, no changes to meds. Encouraged heart healthy diet such as the DASH diet and exercise as tolerated.  °

## 2020-05-16 NOTE — Assessment & Plan Note (Signed)
Encouraged moist heat and gentle stretching as tolerated. May try Tylenol prn and prescription meds as directed and report if symptoms worsen or seek immediate care

## 2020-05-16 NOTE — Assessment & Plan Note (Signed)
Avoid offending foods, start probiotics. Do not eat large meals in late evening and consider raising head of bed.  

## 2020-05-16 NOTE — Assessment & Plan Note (Signed)
Encouraged heart healthy diet, increase exercise, avoid trans fats, consider a krill oil cap daily 

## 2020-05-16 NOTE — Assessment & Plan Note (Signed)
She underwent an ablation and she notes it has been very helpful.

## 2020-05-16 NOTE — Assessment & Plan Note (Addendum)
Continues to struggle with fatigue but it is stable and multifactorial. Describes episodes of dizziness at times. Advised to hydrate more and more consistently. Eat protein every 4-5 hours and move slowly, report if no improvement.

## 2020-05-31 ENCOUNTER — Other Ambulatory Visit: Payer: Self-pay | Admitting: *Deleted

## 2020-05-31 MED ORDER — APIXABAN 5 MG PO TABS: 5.0000 mg | ORAL_TABLET | Freq: Two times a day (BID) | ORAL | 1 refills | Status: DC

## 2020-05-31 NOTE — Telephone Encounter (Signed)
Prescription refill request for Eliquis received. Indication: Afib Last office visit: 05/04/2020, [place] Scr: 0.61, 02/02/2020 Age: 80 yo  Weight: 79.3 kg   Pt is on the correct dose of Eliquis per dosing criteria, prescription refill sent for Eliquis 5mg  bid.

## 2020-06-16 DIAGNOSIS — Z23 Encounter for immunization: Secondary | ICD-10-CM | POA: Diagnosis not present

## 2020-07-27 ENCOUNTER — Other Ambulatory Visit: Payer: Self-pay | Admitting: Family Medicine

## 2020-07-27 NOTE — Telephone Encounter (Signed)
Requesting: tramadol 50mg   Contract: None UDS: None Last Visit: 05/11/2020 Next Visit: 11/23/2020 Last Refill: 01/21/2020 #20 and 0RF  Please Advise

## 2020-07-28 ENCOUNTER — Telehealth: Payer: Self-pay | Admitting: Cardiology

## 2020-07-28 MED ORDER — METOPROLOL TARTRATE 25 MG PO TABS: 25.0000 mg | ORAL_TABLET | Freq: Two times a day (BID) | ORAL | 2 refills | Status: DC

## 2020-07-28 NOTE — Telephone Encounter (Signed)
Refills has been sent to the pharmacy for patient's Metoprolol.

## 2020-07-28 NOTE — Telephone Encounter (Signed)
*  STAT* If patient is at the pharmacy, call can be transferred to refill team.   1. Which medications need to be refilled? (please list name of each medication and dose if known) metoprolol    2. Which pharmacy/location (including street and city if local pharmacy) is medication to be sent to? [facility]   3. Do they need a 30 day or 90 day supply? 90   Patient is almost out medication

## 2020-07-28 NOTE — Telephone Encounter (Signed)
Patient is requesting to convert her appointment on 08/02/20 with Dr. Percival Spanish to a virtual. She states her daughter will have to request off of work to bring her in for an office visit. Unable to contact nurse for advisement. Please return call to patient when able.

## 2020-07-29 NOTE — Telephone Encounter (Signed)
Spoke with pt, visit changed to a my chart visit at patient request
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
## 2020-08-02 ENCOUNTER — Encounter: Payer: Self-pay | Admitting: Cardiology

## 2020-08-02 ENCOUNTER — Telehealth (INDEPENDENT_AMBULATORY_CARE_PROVIDER_SITE_OTHER): Payer: Medicare Other | Admitting: Cardiology

## 2020-08-02 VITALS — BP 116/70 | HR 57 | Ht 65.0 in | Wt 173.0 lb

## 2020-08-02 DIAGNOSIS — I4819 Other persistent atrial fibrillation: Secondary | ICD-10-CM

## 2020-08-02 DIAGNOSIS — I350 Nonrheumatic aortic (valve) stenosis: Secondary | ICD-10-CM

## 2020-08-02 NOTE — Patient Instructions (Signed)
  Testing/Procedures:  Your physician has requested that you have an echocardiogram. Echocardiography is a painless test that uses sound waves to create images of your heart. It provides your doctor with information about the size and shape of your heart and how well your heart's chambers and valves are working. This procedure takes approximately one hour. There are no restrictions for this procedure. [place]   Follow-Up: At [facility], you and your health needs are our priority.  As part of our continuing mission to provide you with exceptional heart care, we have created designated Provider Care Teams.  These Care Teams include your primary Cardiologist (physician) and Advanced Practice Providers (APPs -  Physician Assistants and Nurse Practitioners) who all work together to provide you with the care you need, when you need it.  We recommend signing up for the patient portal called "MyChart".  Sign up information is provided on this After Visit Summary.  MyChart is used to connect with patients for Virtual Visits (Telemedicine).  Patients are able to view lab/test results, encounter notes, upcoming appointments, etc.  Non-urgent messages can be sent to your provider as well.   To learn more about what you can do with MyChart, go to [email or web address].    Your next appointment:   6 month(s)  The format for your next appointment:   In Person  Provider:   Minus Breeding, MD

## 2020-08-06 ENCOUNTER — Other Ambulatory Visit: Payer: Self-pay | Admitting: Family Medicine

## 2020-08-10 ENCOUNTER — Telehealth: Payer: Self-pay | Admitting: Family Medicine

## 2020-08-10 ENCOUNTER — Other Ambulatory Visit: Payer: Self-pay | Admitting: Family Medicine

## 2020-08-10 DIAGNOSIS — B351 Tinea unguium: Secondary | ICD-10-CM

## 2020-08-10 DIAGNOSIS — M79673 Pain in unspecified foot: Secondary | ICD-10-CM

## 2020-08-10 NOTE — Telephone Encounter (Signed)
Patient is having pain and fungus in her toenail and would like to be referred to a podiatry. Please advise if she needs an appt 1st here or if she can be referred

## 2020-08-10 NOTE — Telephone Encounter (Signed)
Are you ok with referral? °

## 2020-08-11 NOTE — Telephone Encounter (Signed)
Left message on machine to call back to let us know which toe or foot is bothering her so we can send referral.

## 2020-08-12 NOTE — Telephone Encounter (Signed)
Referral placed and pt has an appt already.

## 2020-08-19 ENCOUNTER — Other Ambulatory Visit: Payer: Self-pay

## 2020-08-19 ENCOUNTER — Encounter: Payer: Self-pay | Admitting: Podiatry

## 2020-08-19 ENCOUNTER — Ambulatory Visit (INDEPENDENT_AMBULATORY_CARE_PROVIDER_SITE_OTHER): Payer: Medicare Other | Admitting: Podiatry

## 2020-08-19 DIAGNOSIS — M2011 Hallux valgus (acquired), right foot: Secondary | ICD-10-CM

## 2020-08-19 DIAGNOSIS — M21611 Bunion of right foot: Secondary | ICD-10-CM

## 2020-08-19 DIAGNOSIS — M21612 Bunion of left foot: Secondary | ICD-10-CM | POA: Diagnosis not present

## 2020-08-19 DIAGNOSIS — M2012 Hallux valgus (acquired), left foot: Secondary | ICD-10-CM | POA: Diagnosis not present

## 2020-08-19 DIAGNOSIS — M7742 Metatarsalgia, left foot: Secondary | ICD-10-CM | POA: Diagnosis not present

## 2020-08-19 DIAGNOSIS — M2042 Other hammer toe(s) (acquired), left foot: Secondary | ICD-10-CM | POA: Diagnosis not present

## 2020-08-19 DIAGNOSIS — L600 Ingrowing nail: Secondary | ICD-10-CM

## 2020-08-19 DIAGNOSIS — M7741 Metatarsalgia, right foot: Secondary | ICD-10-CM

## 2020-08-19 DIAGNOSIS — M2041 Other hammer toe(s) (acquired), right foot: Secondary | ICD-10-CM | POA: Diagnosis not present

## 2020-08-19 MED ORDER — LIDOCAINE-PRILOCAINE 2.5-2.5 % EX CREA: 1.0000 "application " | TOPICAL_CREAM | CUTANEOUS | 2 refills | Status: DC | PRN

## 2020-08-19 MED ORDER — SILVER SULFADIAZINE 1 % EX CREA: 1.0000 "application " | TOPICAL_CREAM | Freq: Every day | CUTANEOUS | 0 refills | Status: DC

## 2020-08-19 NOTE — Progress Notes (Signed)
  Subjective:  Patient ID: Mikayla [name], female    DOB: [birth year],  MRN: [id number]  Chief Complaint  Patient presents with   Nail Problem    NP - bilateral nail fungus    80 y.o. female presents with the above complaint. History confirmed with patient.  She is here with her daughter the left third toe is very painful and digging into her skin.  She has significant hammertoe she had hammertoe surgery on the right foot.  Objective:  Physical Exam: warm, good capillary refill, no trophic changes or ulcerative lesions, normal DP and PT pulses, and normal sensory exam.  She has varicose veins.  Severe hammertoe deformities bilateral within metatarsal pad plantarly.  Severe hallux valgus on the right.  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 Ingrown nail of third toe of left foot   2. Hammertoe of left foot   3. Hammertoe of right foot   4. Hallux valgus with bunions, left   5. Hallux valgus with bunions, right   6. Metatarsalgia of both feet      Plan:  Patient was evaluated and treated and all questions answered.  Discussed treatment for the dystrophic nail.  This was severely ingrown and very painful for her.  Required local anesthetic with a digital block of 3 cc lidocaine 2% plain.  Once I was able to evaluate I recommended total permanent nail avulsion.  We discussed the option of partial permanent nail avulsion but think this will give her a better chance of it not becoming a recurrent issue.  Intermittent treatment will be too painful and require anesthetic every time.  Following prep with Betadine after the anesthetic block I avulsed the nail plate of the left third toe and treated with 3 applications of phenol and irrigated with alcohol applied a bandage with Silvadene.  Gave her a prescription for this as well and post care instructions.   For the severe hammertoe and bunions and metatarsalgia I recommended nonsurgical treatment with  comfortable shoe gear and padding.  I think her only surgical option would be metatarsal head resections she will try to avoid this if possible.   Return in about 2 weeks (around 09/02/2020) for nail re-check.

## 2020-08-19 NOTE — Patient Instructions (Signed)

## 2020-08-25 ENCOUNTER — Telehealth: Payer: Self-pay | Admitting: *Deleted

## 2020-08-25 NOTE — Telephone Encounter (Signed)
Patient is calling and wanted to know if she should be applying the topical cream twice a day w/ the epsom salts soaking. The prescription label only specifies only once daily.  Returned call to patient and explained to her to take according to the prescription,verbalized understanding. Please advise.

## 2020-08-25 NOTE — Telephone Encounter (Signed)
Note completed 

## 2020-08-25 NOTE — Telephone Encounter (Signed)
Yes that's fine. Thank you

## 2020-08-31 ENCOUNTER — Ambulatory Visit: Payer: Medicare Other | Admitting: Podiatry

## 2020-09-02 ENCOUNTER — Other Ambulatory Visit: Payer: Self-pay

## 2020-09-02 ENCOUNTER — Ambulatory Visit (INDEPENDENT_AMBULATORY_CARE_PROVIDER_SITE_OTHER): Payer: Medicare Other | Admitting: Podiatry

## 2020-09-02 DIAGNOSIS — L600 Ingrowing nail: Secondary | ICD-10-CM | POA: Diagnosis not present

## 2020-09-05 NOTE — Progress Notes (Signed)
  Subjective:  Patient ID: Mikayla [name], female    DOB: 1940-02-25,  MRN: [id number]  No chief complaint on file.   80 y.o. female returns for follow-up after total nail avulsion of the left third toenail  Objective:  Physical Exam: warm, good capillary refill, no trophic changes or ulcerative lesions, normal DP and PT pulses, and normal sensory exam.  She has varicose veins.  Severe hammertoe deformities bilateral within metatarsal pad plantarly.  Severe hallux valgus on the right.  Matricectomy is healing well    Assessment:   No diagnosis found.    Plan:  Patient was evaluated and treated and all questions answered.  Matricectomy site is healing well she can begin to leave open to air and discontinue soaks and ointment and should heal up uneventfully no signs of infection.  Return as needed   Return if symptoms worsen or fail to improve.

## 2020-09-15 DIAGNOSIS — H5203 Hypermetropia, bilateral: Secondary | ICD-10-CM | POA: Diagnosis not present

## 2020-09-15 DIAGNOSIS — H18513 Endothelial corneal dystrophy, bilateral: Secondary | ICD-10-CM | POA: Diagnosis not present

## 2020-09-15 DIAGNOSIS — H02834 Dermatochalasis of left upper eyelid: Secondary | ICD-10-CM | POA: Diagnosis not present

## 2020-09-15 DIAGNOSIS — H43813 Vitreous degeneration, bilateral: Secondary | ICD-10-CM | POA: Diagnosis not present

## 2020-09-15 DIAGNOSIS — H353131 Nonexudative age-related macular degeneration, bilateral, early dry stage: Secondary | ICD-10-CM | POA: Diagnosis not present

## 2020-09-15 DIAGNOSIS — H35033 Hypertensive retinopathy, bilateral: Secondary | ICD-10-CM | POA: Diagnosis not present

## 2020-09-15 DIAGNOSIS — H02831 Dermatochalasis of right upper eyelid: Secondary | ICD-10-CM | POA: Diagnosis not present

## 2020-09-15 DIAGNOSIS — H52203 Unspecified astigmatism, bilateral: Secondary | ICD-10-CM | POA: Diagnosis not present

## 2020-09-15 DIAGNOSIS — H40003 Preglaucoma, unspecified, bilateral: Secondary | ICD-10-CM | POA: Diagnosis not present

## 2020-09-15 DIAGNOSIS — H524 Presbyopia: Secondary | ICD-10-CM | POA: Diagnosis not present

## 2020-09-15 DIAGNOSIS — H35363 Drusen (degenerative) of macula, bilateral: Secondary | ICD-10-CM | POA: Diagnosis not present

## 2020-10-12 ENCOUNTER — Ambulatory Visit (HOSPITAL_COMMUNITY): Payer: Medicare Other | Attending: Internal Medicine

## 2020-10-12 ENCOUNTER — Other Ambulatory Visit: Payer: Self-pay

## 2020-10-12 DIAGNOSIS — I350 Nonrheumatic aortic (valve) stenosis: Secondary | ICD-10-CM

## 2020-10-12 LAB — ECHOCARDIOGRAM COMPLETE
AR max vel: 1.55 cm2
AV Area VTI: 1.7 cm2
AV Area mean vel: 1.57 cm2
AV Mean grad: 11 mmHg
AV Peak grad: 22.5 mmHg
Ao pk vel: 2.37 m/s
Area-P 1/2: 2 cm2
P 1/2 time: 470 msec
S' Lateral: 2.6 cm

## 2020-10-26 ENCOUNTER — Ambulatory Visit: Payer: Medicare Other | Admitting: Cardiology

## 2020-11-23 ENCOUNTER — Encounter: Payer: Self-pay | Admitting: Family Medicine

## 2020-11-23 ENCOUNTER — Ambulatory Visit (INDEPENDENT_AMBULATORY_CARE_PROVIDER_SITE_OTHER): Payer: Medicare Other | Admitting: Family Medicine

## 2020-11-23 ENCOUNTER — Other Ambulatory Visit: Payer: Self-pay

## 2020-11-23 VITALS — BP 114/68 | HR 84 | Temp 98.1°F | Resp 16 | Wt 172.8 lb

## 2020-11-23 DIAGNOSIS — I10 Essential (primary) hypertension: Secondary | ICD-10-CM

## 2020-11-23 DIAGNOSIS — R609 Edema, unspecified: Secondary | ICD-10-CM | POA: Diagnosis not present

## 2020-11-23 DIAGNOSIS — E785 Hyperlipidemia, unspecified: Secondary | ICD-10-CM | POA: Diagnosis not present

## 2020-11-23 DIAGNOSIS — Z23 Encounter for immunization: Secondary | ICD-10-CM | POA: Diagnosis not present

## 2020-11-23 DIAGNOSIS — I480 Paroxysmal atrial fibrillation: Secondary | ICD-10-CM

## 2020-11-23 LAB — CBC WITH DIFFERENTIAL/PLATELET
Basophils Absolute: 0.1 10*3/uL (ref 0.0–0.1)
Basophils Relative: 1 % (ref 0.0–3.0)
Eosinophils Absolute: 0.1 10*3/uL (ref 0.0–0.7)
Eosinophils Relative: 0.8 % (ref 0.0–5.0)
HCT: 38.8 % (ref 36.0–46.0)
Hemoglobin: 12.8 g/dL (ref 12.0–15.0)
Lymphocytes Relative: 16.6 % (ref 12.0–46.0)
Lymphs Abs: 1 10*3/uL (ref 0.7–4.0)
MCHC: 33 g/dL (ref 30.0–36.0)
MCV: 91.9 fl (ref 78.0–100.0)
Monocytes Absolute: 0.4 10*3/uL (ref 0.1–1.0)
Monocytes Relative: 7.1 % (ref 3.0–12.0)
Neutro Abs: 4.6 10*3/uL (ref 1.4–7.7)
Neutrophils Relative %: 74.5 % (ref 43.0–77.0)
Platelets: 193 10*3/uL (ref 150.0–400.0)
RBC: 4.22 Mil/uL (ref 3.87–5.11)
RDW: 14.4 % (ref 11.5–15.5)
WBC: 6.2 10*3/uL (ref 4.0–10.5)

## 2020-11-23 LAB — COMPREHENSIVE METABOLIC PANEL
ALT: 12 U/L (ref 0–35)
AST: 18 U/L (ref 0–37)
Albumin: 4.1 g/dL (ref 3.5–5.2)
Alkaline Phosphatase: 71 U/L (ref 39–117)
BUN: 13 mg/dL (ref 6–23)
CO2: 28 mEq/L (ref 19–32)
Calcium: 9.1 mg/dL (ref 8.4–10.5)
Chloride: 105 mEq/L (ref 96–112)
Creatinine, Ser: 0.6 mg/dL (ref 0.40–1.20)
GFR: 83.77 mL/min (ref >60.00)
Glucose, Bld: 90 mg/dL (ref 70–99)
Potassium: 4.1 mEq/L (ref 3.5–5.1)
Sodium: 142 mEq/L (ref 135–145)
Total Bilirubin: 0.8 mg/dL (ref 0.2–1.2)
Total Protein: 6.7 g/dL (ref 6.0–8.3)

## 2020-11-23 LAB — TSH: TSH: 1.52 u[IU]/mL (ref 0.35–5.50)

## 2020-11-23 LAB — LIPID PANEL
Cholesterol: 191 mg/dL (ref 0–200)
HDL: 57.8 mg/dL (ref >39.00)
LDL Cholesterol: 112 mg/dL — ABNORMAL HIGH (ref 0–99)
NonHDL: 133.25
Total CHOL/HDL Ratio: 3
Triglycerides: 104 mg/dL (ref 0.0–149.0)
VLDL: 20.8 mg/dL (ref 0.0–40.0)
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
## 2020-11-23 NOTE — Patient Instructions (Signed)
Molnupiravir/Paxlovid is the new COVID medication we can give you if you get COVID so make sure you test if you have symptoms because we have to treat by day 5 of symptoms for it to be effective. If you are positive let us know so we can treat. If a home test is negative and your symptoms are persistent get a PCR test. Can check testing locations at [email or web address] If you are positive we will make an appointment with us and we will send in molnupiravir/paxlovid if you would like it. Check with your pharmacy before we meet to confirm they have it in stock, if they do not then we can get the prescription at the [facility] [place] [facility].   

## 2020-11-24 NOTE — Assessment & Plan Note (Signed)
Well controlled, no changes to meds. Encouraged heart healthy diet such as the DASH diet and exercise as tolerated.  °

## 2020-11-24 NOTE — Assessment & Plan Note (Signed)
Encourage heart healthy diet such as MIND or DASH diet, increase exercise, avoid trans fats, simple carbohydrates and processed foods, consider a krill or fish or flaxseed oil cap daily.  °

## 2020-11-24 NOTE — Assessment & Plan Note (Signed)
Patient underwent ablation but is still maintained on Eliquis and she is tolerating well

## 2020-11-24 NOTE — Assessment & Plan Note (Signed)
Elevate feet, wear compression hose, minimize sodium

## 2020-11-25 ENCOUNTER — Telehealth: Payer: Self-pay | Admitting: Cardiology

## 2020-11-25 MED ORDER — APIXABAN 5 MG PO TABS: 5.0000 mg | ORAL_TABLET | Freq: Two times a day (BID) | ORAL | 1 refills | Status: DC

## 2020-11-25 NOTE — Telephone Encounter (Signed)
Prescription refill request for Eliquis received. Indication:AFIB Last office visit:HOCHREIN 07/13/20 Scr: 0.60 11/23/20 Age: 48F Weight:78.4KG

## 2020-11-25 NOTE — Telephone Encounter (Signed)
*  STAT* If patient is at the pharmacy, call can be transferred to refill team.   1. Which medications need to be refilled? (please list name of each medication and dose if known)  a new prescription for Eliquis  2. Which pharmacy/location (including street and city if local pharmacy) is medication to be sent to [place], [place]  3. Do they need a 30 day or 90 day supply? 90 days and refills

## 2020-11-26 DIAGNOSIS — Z23 Encounter for immunization: Secondary | ICD-10-CM | POA: Diagnosis not present

## 2021-01-13 ENCOUNTER — Telehealth: Payer: Self-pay | Admitting: Cardiology

## 2021-01-13 NOTE — Telephone Encounter (Signed)
Spoke to patient . Per Dr Percival Spanish , okay to changes to virtual visit . Patient states she will be able to  do vital signs prior to appointment . Aware office will call to do preliminary information then contact with MyChart. Move appointment to 8 am  instead of 11 am per Dr Percival Spanish. Patient verbalized understanding

## 2021-01-13 NOTE — Telephone Encounter (Signed)
New Message:      Patient would like to know if she have a Virtual Visit for her appointment on Monday(01-17-21?
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
## 2021-01-17 ENCOUNTER — Other Ambulatory Visit: Payer: Self-pay

## 2021-01-17 ENCOUNTER — Ambulatory Visit: Payer: Medicare Other | Admitting: Cardiology

## 2021-01-17 ENCOUNTER — Telehealth (INDEPENDENT_AMBULATORY_CARE_PROVIDER_SITE_OTHER): Payer: Medicare Other | Admitting: Cardiology

## 2021-01-17 ENCOUNTER — Encounter: Payer: Self-pay | Admitting: Cardiology

## 2021-01-17 VITALS — BP 155/79 | HR 62 | Ht 65.0 in | Wt 170.0 lb

## 2021-01-17 DIAGNOSIS — I4891 Unspecified atrial fibrillation: Secondary | ICD-10-CM | POA: Diagnosis not present

## 2021-01-17 DIAGNOSIS — Z952 Presence of prosthetic heart valve: Secondary | ICD-10-CM

## 2021-01-17 NOTE — Patient Instructions (Signed)
Medication Instructions:  Your Physician recommend you continue on your current medication as directed.    *If you need a refill on your cardiac medications before your next appointment, please call your pharmacy*   Follow-Up: At [facility], you and your health needs are our priority.  As part of our continuing mission to provide you with exceptional heart care, we have created designated Provider Care Teams.  These Care Teams include your primary Cardiologist (physician) and Advanced Practice Providers (APPs -  Physician Assistants and Nurse Practitioners) who all work together to provide you with the care you need, when you need it.  We recommend signing up for the patient portal called "MyChart".  Sign up information is provided on this After Visit Summary.  MyChart is used to connect with patients for Virtual Visits (Telemedicine).  Patients are able to view lab/test results, encounter notes, upcoming appointments, etc.  Non-urgent messages can be sent to your provider as well.   To learn more about what you can do with MyChart, go to [email or web address].    Your next appointment:   9 month(s)  The format for your next appointment:   In Person  Provider:   Minus Breeding, MD

## 2021-01-30 ENCOUNTER — Other Ambulatory Visit: Payer: Self-pay | Admitting: Family Medicine

## 2021-02-03 ENCOUNTER — Telehealth: Payer: Self-pay | Admitting: Family Medicine

## 2021-02-03 NOTE — Telephone Encounter (Signed)
Pt stated insurance will no longer cover her medications at [facility]. She will be switching to [facility] 02/06/21.   [facility]: [address] [place],  [address] [phone]

## 2021-02-15 ENCOUNTER — Encounter: Payer: Self-pay | Admitting: Family Medicine

## 2021-02-19 ENCOUNTER — Other Ambulatory Visit: Payer: Self-pay | Admitting: Family Medicine

## 2021-02-21 ENCOUNTER — Telehealth: Payer: Self-pay | Admitting: Family Medicine

## 2021-02-21 ENCOUNTER — Other Ambulatory Visit: Payer: Self-pay

## 2021-02-21 MED ORDER — APIXABAN 5 MG PO TABS: 5.0000 mg | ORAL_TABLET | Freq: Two times a day (BID) | ORAL | 1 refills | Status: DC

## 2021-02-21 MED ORDER — OMEPRAZOLE 40 MG PO CPDR: 40.0000 mg | DELAYED_RELEASE_CAPSULE | Freq: Every day | ORAL | 1 refills | Status: DC

## 2021-02-21 NOTE — Telephone Encounter (Signed)
Resent medication

## 2021-02-21 NOTE — Telephone Encounter (Signed)
Pt called ov regarding med refill she did not request. Pt would like to disregard refill for 1/16, meds were sent to wrong pharm. Pt called 12/29 and switched pharn due to insurance. Will call when refill is needed.

## 2021-02-21 NOTE — Telephone Encounter (Signed)
Prescription refill request for Eliquis received. Indication: Afib  Last office visit: 01/17/21 ([place])  Scr: 0.60 (11/23/20)  Age: 81 Weight: 77.1kg  Appropriate dose and refill sent to requested pharmacy.

## 2021-02-23 DIAGNOSIS — H401131 Primary open-angle glaucoma, bilateral, mild stage: Secondary | ICD-10-CM | POA: Diagnosis not present

## 2021-03-01 ENCOUNTER — Encounter: Payer: Self-pay | Admitting: Cardiology

## 2021-04-26 ENCOUNTER — Telehealth: Payer: Self-pay | Admitting: Cardiology

## 2021-04-26 NOTE — Telephone Encounter (Signed)
?*  STAT* If patient is at the pharmacy, call can be transferred to refill team. ? ? ?1. Which medications need to be refilled? (please list name of each medication and dose if known) metoprolol tartrate (LOPRESSOR) 25 MG tablet ? ?2. Which pharmacy/location (including street and city if local pharmacy) is medication to be sent to?[id number] - [place], [place] - [address] AT [address] ? ?3. Do they need a 30 day or 90 day supply? 90  ?

## 2021-04-27 MED ORDER — METOPROLOL TARTRATE 25 MG PO TABS: 25.0000 mg | ORAL_TABLET | Freq: Two times a day (BID) | ORAL | 2 refills | Status: DC

## 2021-04-27 NOTE — Telephone Encounter (Signed)
Refills has been sent to the pharmacy. 

## 2021-05-03 ENCOUNTER — Other Ambulatory Visit: Payer: Self-pay

## 2021-05-03 ENCOUNTER — Ambulatory Visit (INDEPENDENT_AMBULATORY_CARE_PROVIDER_SITE_OTHER): Payer: Medicare Other

## 2021-05-03 DIAGNOSIS — Z0000 Encounter for general adult medical examination without abnormal findings: Secondary | ICD-10-CM
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
## 2021-05-03 NOTE — Patient Instructions (Addendum)
[name] , ?Thank you for taking time to come for your Medicare Wellness Visit. I appreciate your ongoing commitment to your health goals. Please review the following plan we discussed and let me know if I can assist you in the future.  ? ?Screening recommendations/referrals: ?Colonoscopy: No longer required  ?Mammogram: No longer required  ?Bone Density: Done 11/11/14 ?Recommended yearly ophthalmology/optometry visit for glaucoma screening and checkup ?Recommended yearly dental visit for hygiene and checkup ? ?Vaccinations: ?Influenza vaccine: Done 11/23/20 repeat every year  ?Pneumococcal vaccine: Up to date ?Tdap vaccine: Done 12/07/11 repeat every 10 years  ?Shingles vaccine: Completed 5/25,09/13/20 ?Covid-19:completed 1/21, 2/11, 11/11/19 & 5/11, 11/26/20 ? ?Advanced directives: copies in chart  ? ?Conditions/risks identified: more exercise  ? ?Next appointment: Follow up in one year for your annual wellness visit  ? ? ?Preventive Care 48 Years and Older, Female ?Preventive care refers to lifestyle choices and visits with your health care provider that can promote health and wellness. ?What does preventive care include? ?A yearly physical exam. This is also called an annual well check. ?Dental exams once or twice a year. ?Routine eye exams. Ask your health care provider how often you should have your eyes checked. ?Personal lifestyle choices, including: ?Daily care of your teeth and gums. ?Regular physical activity. ?Eating a healthy diet. ?Avoiding tobacco and drug use. ?Limiting alcohol use. ?Practicing safe sex. ?Taking low-dose aspirin every day. ?Taking vitamin and mineral supplements as recommended by your health care provider. ?What happens during an annual well check? ?The services and screenings done by your health care provider during your annual well check will depend on your age, overall health, lifestyle risk factors, and family history of disease. ?Counseling  ?Your health care provider may ask you  questions about your: ?Alcohol use. ?Tobacco use. ?Drug use. ?Emotional well-being. ?Home and relationship well-being. ?Sexual activity. ?Eating habits. ?History of falls. ?Memory and ability to understand (cognition). ?Work and work Statistician. ?Reproductive health. ?Screening  ?You may have the following tests or measurements: ?Height, weight, and BMI. ?Blood pressure. ?Lipid and cholesterol levels. These may be checked every 5 years, or more frequently if you are over 79 years old. ?Skin check. ?Lung cancer screening. You may have this screening every year starting at age 44 if you have a 30-pack-year history of smoking and currently smoke or have quit within the past 15 years. ?Fecal occult blood test (FOBT) of the stool. You may have this test every year starting at age 69. ?Flexible sigmoidoscopy or colonoscopy. You may have a sigmoidoscopy every 5 years or a colonoscopy every 10 years starting at age 73. ?Hepatitis C blood test. ?Hepatitis B blood test. ?Sexually transmitted disease (STD) testing. ?Diabetes screening. This is done by checking your blood sugar (glucose) after you have not eaten for a while (fasting). You may have this done every 1-3 years. ?Bone density scan. This is done to screen for osteoporosis. You may have this done starting at age 90. ?Mammogram. This may be done every 1-2 years. Talk to your health care provider about how often you should have regular mammograms. ?Talk with your health care provider about your test results, treatment options, and if necessary, the need for more tests. ?Vaccines  ?Your health care provider may recommend certain vaccines, such as: ?Influenza vaccine. This is recommended every year. ?Tetanus, diphtheria, and acellular pertussis (Tdap, Td) vaccine. You may need a Td booster every 10 years. ?Zoster vaccine. You may need this after age 17. ?Pneumococcal 13-valent conjugate (PCV13) vaccine.  One dose is recommended after age 61. ?Pneumococcal polysaccharide  (PPSV23) vaccine. One dose is recommended after age 60. ?Talk to your health care provider about which screenings and vaccines you need and how often you need them. ?This information is not intended to replace advice given to you by your health care provider. Make sure you discuss any questions you have with your health care provider. ?Document Released: 02/19/2015 Document Revised: 10/13/2015 Document Reviewed: 11/24/2014 ?Elsevier Interactive Patient Education ? 2017 [employer]. ? ?Fall Prevention in the Home ?Falls can cause injuries. They can happen to people of all ages. There are many things you can do to make your home safe and to help prevent falls. ?What can I do on the outside of my home? ?Regularly fix the edges of walkways and driveways and fix any cracks. ?Remove anything that might make you trip as you walk through a door, such as a raised step or threshold. ?Trim any bushes or trees on the path to your home. ?Use bright outdoor lighting. ?Clear any walking paths of anything that might make someone trip, such as rocks or tools. ?Regularly check to see if handrails are loose or broken. Make sure that both sides of any steps have handrails. ?Any raised decks and porches should have guardrails on the edges. ?Have any leaves, snow, or ice cleared regularly. ?Use sand or salt on walking paths during winter. ?Clean up any spills in your garage right away. This includes oil or grease spills. ?What can I do in the bathroom? ?Use night lights. ?Install grab bars by the toilet and in the tub and shower. Do not use towel bars as grab bars. ?Use non-skid mats or decals in the tub or shower. ?If you need to sit down in the shower, use a plastic, non-slip stool. ?Keep the floor dry. Clean up any water that spills on the floor as soon as it happens. ?Remove soap buildup in the tub or shower regularly. ?Attach bath mats securely with double-sided non-slip rug tape. ?Do not have throw rugs and other things on the  floor that can make you trip. ?What can I do in the bedroom? ?Use night lights. ?Make sure that you have a light by your bed that is easy to reach. ?Do not use any sheets or blankets that are too big for your bed. They should not hang down onto the floor. ?Have a firm chair that has side arms. You can use this for support while you get dressed. ?Do not have throw rugs and other things on the floor that can make you trip. ?What can I do in the kitchen? ?Clean up any spills right away. ?Avoid walking on wet floors. ?Keep items that you use a lot in easy-to-reach places. ?If you need to reach something above you, use a strong step stool that has a grab bar. ?Keep electrical cords out of the way. ?Do not use floor polish or wax that makes floors slippery. If you must use wax, use non-skid floor wax. ?Do not have throw rugs and other things on the floor that can make you trip. ?What can I do with my stairs? ?Do not leave any items on the stairs. ?Make sure that there are handrails on both sides of the stairs and use them. Fix handrails that are broken or loose. Make sure that handrails are as long as the stairways. ?Check any carpeting to make sure that it is firmly attached to the stairs. Fix any carpet that is loose or  worn. ?Avoid having throw rugs at the top or bottom of the stairs. If you do have throw rugs, attach them to the floor with carpet tape. ?Make sure that you have a light switch at the top of the stairs and the bottom of the stairs. If you do not have them, ask someone to add them for you. ?What else can I do to help prevent falls? ?Wear shoes that: ?Do not have high heels. ?Have rubber bottoms. ?Are comfortable and fit you well. ?Are closed at the toe. Do not wear sandals. ?If you use a stepladder: ?Make sure that it is fully opened. Do not climb a closed stepladder. ?Make sure that both sides of the stepladder are locked into place. ?Ask someone to hold it for you, if possible. ?Clearly mark and make  sure that you can see: ?Any grab bars or handrails. ?First and last steps. ?Where the edge of each step is. ?Use tools that help you move around (mobility aids) if they are needed. These include: ?Canes. ?Wa

## 2021-05-11 DIAGNOSIS — H04123 Dry eye syndrome of bilateral lacrimal glands: Secondary | ICD-10-CM | POA: Diagnosis not present

## 2021-05-11 DIAGNOSIS — H18513 Endothelial corneal dystrophy, bilateral: Secondary | ICD-10-CM | POA: Diagnosis not present

## 2021-05-11 DIAGNOSIS — H401131 Primary open-angle glaucoma, bilateral, mild stage: Secondary | ICD-10-CM | POA: Diagnosis not present

## 2021-05-19 ENCOUNTER — Other Ambulatory Visit: Payer: Self-pay | Admitting: Cardiology

## 2021-05-20 NOTE — Telephone Encounter (Signed)
Prescription refill request for Eliquis received. ?Indication:Afib ?Last office visit:12/22 ?Scr:0.6 ?Age: 81 ?Weight:77.1 kg ? ?Prescription refilled ? ?
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
## 2021-05-24 ENCOUNTER — Encounter: Payer: Self-pay | Admitting: Family Medicine

## 2021-05-24 ENCOUNTER — Telehealth (INDEPENDENT_AMBULATORY_CARE_PROVIDER_SITE_OTHER): Payer: Medicare Other | Admitting: Family Medicine

## 2021-05-24 VITALS — BP 118/78 | HR 67 | Wt 168.0 lb

## 2021-05-24 DIAGNOSIS — I480 Paroxysmal atrial fibrillation: Secondary | ICD-10-CM | POA: Diagnosis not present

## 2021-05-24 DIAGNOSIS — I5032 Chronic diastolic (congestive) heart failure: Secondary | ICD-10-CM | POA: Diagnosis not present

## 2021-05-24 DIAGNOSIS — I10 Essential (primary) hypertension: Secondary | ICD-10-CM

## 2021-05-24 DIAGNOSIS — E785 Hyperlipidemia, unspecified: Secondary | ICD-10-CM | POA: Diagnosis not present

## 2021-05-24 DIAGNOSIS — R609 Edema, unspecified: Secondary | ICD-10-CM

## 2021-05-24 DIAGNOSIS — R7989 Other specified abnormal findings of blood chemistry: Secondary | ICD-10-CM

## 2021-05-24 DIAGNOSIS — Z952 Presence of prosthetic heart valve: Secondary | ICD-10-CM | POA: Diagnosis not present

## 2021-05-24 DIAGNOSIS — M25561 Pain in right knee: Secondary | ICD-10-CM | POA: Diagnosis not present

## 2021-05-24 DIAGNOSIS — G8929 Other chronic pain: Secondary | ICD-10-CM | POA: Diagnosis not present

## 2021-05-24 MED ORDER — FUROSEMIDE 20 MG PO TABS: ORAL_TABLET | ORAL | 3 refills | Status: DC

## 2021-05-24 NOTE — Assessment & Plan Note (Signed)
Hydrate and monitor, minimize simple carbs and processed foods ?

## 2021-05-24 NOTE — Assessment & Plan Note (Signed)
Well controlled, no changes to meds. Encouraged heart healthy diet such as the DASH diet and exercise as tolerated.  °

## 2021-05-24 NOTE — Assessment & Plan Note (Signed)
She reports overall she is doing well and has no new concerns in this regard, is following with cardiology ?

## 2021-05-24 NOTE — Assessment & Plan Note (Signed)
Encourage heart healthy diet such as MIND or DASH diet, increase exercise, avoid trans fats, simple carbohydrates and processed foods, consider a krill or fish or flaxseed oil cap daily.  °

## 2021-05-24 NOTE — Patient Instructions (Signed)
Chronic Knee Pain, Adult Chronic knee pain is pain in one or both knees that lasts longer than 3 months. Symptoms of chronic knee pain may include swelling, stiffness, and discomfort. Age-related wear and tear (osteoarthritis) of the knee joint is the most common cause of chronic knee pain. Other possible causes include: A long-term immune-related disease that causes inflammation of the knee (rheumatoid arthritis). This usually affects both knees. Inflammatory arthritis, such as gout or pseudogout. An injury to the knee that causes arthritis. An injury to the knee that damages the ligaments. Ligaments are strong tissues that connect bones to each other. Runner's knee or pain behind the kneecap. Treatment for chronic knee pain depends on the cause. The main treatments for chronic knee pain are physical therapy and weight loss. This condition may also be treated with medicines, injections, a knee sleeve or brace, and by using crutches. Rest, ice, pressure (compression), and elevation, also known as RICE therapy, may also be recommended. Follow these instructions at home: If you have a knee sleeve or brace:  Wear the knee sleeve or brace as told by your health care provider. Remove it only as told by your health care provider. Loosen it if your toes tingle, become numb, or turn cold and blue. Keep it clean. If the sleeve or brace is not waterproof: Do not let it get wet. Remove it if allowed by your health care provider, or cover it with a watertight covering when you take a bath or a shower. Managing pain, stiffness, and swelling     If directed, apply heat to the affected area as often as told by your health care provider. Use the heat source that your health care provider recommends, such as a moist heat pack or a heating pad. If you have a removable knee sleeve or brace, remove it as told by your health care provider. Place a towel between your skin and the heat source. Leave the heat on for  20-30 minutes. Remove the heat if your skin turns bright red. This is especially important if you are unable to feel pain, heat, or cold. You may have a greater risk of getting burned. If directed, put ice on the affected area. To do this: If you have a removable knee sleeve or brace, remove it as told by your health care provider. Put ice in a plastic bag. Place a towel between your skin and the bag. Leave the ice on for 20 minutes, 2-3 times a day. Remove the ice if your skin turns bright red. This is very important. If you cannot feel pain, heat, or cold, you have a greater risk of damage to the area. Move your toes often to reduce stiffness and swelling. Raise (elevate) the injured area above the level of your heart while you are sitting or lying down. Activity Avoid high-impact activities or exercises, such as running, jumping rope, or doing jumping jacks. Follow the exercise plan that your health care provider designed for you. Your health care provider may suggest that you: Avoid activities that make knee pain worse. This may require you to change your exercise routines, sport participation, or job duties. Wear shoes with cushioned soles. Avoid sports that require running and sudden changes in direction. Do physical therapy. Physical therapy is planned to match your needs and abilities. It may include exercises for strength, flexibility, stability, and endurance. Do exercises that increase balance and strength, such as tai chi and yoga. Do not use the injured limb to support your   body weight until your health care provider says that you can. Use crutches as told by your health care provider. Return to your normal activities as told by your health care provider. Ask your health care provider what activities are safe for you. General instructions Take over-the-counter and prescription medicines only as told by your health care provider. Lose weight if you are overweight. Losing even a  little weight can reduce knee pain. Ask your health care provider what your ideal weight is, and how to safely lose extra weight. A dietitian may be able to help you plan your meals. Do not use any products that contain nicotine or tobacco, such as cigarettes, e-cigarettes, and chewing tobacco. These can delay healing. If you need help quitting, ask your health care provider. Keep all follow-up visits. This is important. Contact a health care provider if: You have knee pain that is not getting better or gets worse. You are unable to do your physical therapy exercises due to knee pain. Get help right away if: Your knee swells and the swelling becomes worse. You cannot move your knee. You have severe knee pain. Summary Knee pain that lasts more than 3 months is considered chronic knee pain. The main treatments for chronic knee pain are physical therapy and weight loss. You may also need to take medicines, wear a knee sleeve or brace, use crutches, and apply ice or heat. Losing even a little weight can reduce knee pain. Ask your health care provider what your ideal weight is, and how to safely lose extra weight. A dietitian may be able to help you plan your meals. Follow the exercise plan that your health care provider designed for you. This information is not intended to replace advice given to you by your health care provider. Make sure you discuss any questions you have with your health care provider. Document Revised: 07/09/2019 Document Reviewed: 07/09/2019 Elsevier Patient Education  2023 Elsevier Inc.  

## 2021-05-30 ENCOUNTER — Other Ambulatory Visit: Payer: Medicare Other

## 2021-06-01 ENCOUNTER — Other Ambulatory Visit (INDEPENDENT_AMBULATORY_CARE_PROVIDER_SITE_OTHER): Payer: Medicare Other

## 2021-06-01 ENCOUNTER — Ambulatory Visit (HOSPITAL_BASED_OUTPATIENT_CLINIC_OR_DEPARTMENT_OTHER)
Admission: RE | Admit: 2021-06-01 | Discharge: 2021-06-01 | Disposition: A | Discharge status: Home / Self Care | Payer: Medicare Other | Source: Ambulatory Visit | Attending: Family Medicine | Admitting: Family Medicine

## 2021-06-01 DIAGNOSIS — I10 Essential (primary) hypertension: Secondary | ICD-10-CM

## 2021-06-01 DIAGNOSIS — E785 Hyperlipidemia, unspecified: Secondary | ICD-10-CM | POA: Diagnosis not present

## 2021-06-01 DIAGNOSIS — G8929 Other chronic pain: Secondary | ICD-10-CM | POA: Diagnosis not present

## 2021-06-01 DIAGNOSIS — M25561 Pain in right knee: Secondary | ICD-10-CM | POA: Insufficient documentation

## 2021-06-01 LAB — LIPID PANEL
Cholesterol: 191 mg/dL (ref 0–200)
HDL: 59.4 mg/dL (ref >39.00)
LDL Cholesterol: 112 mg/dL — ABNORMAL HIGH (ref 0–99)
NonHDL: 131.71
Total CHOL/HDL Ratio: 3
Triglycerides: 99 mg/dL (ref 0.0–149.0)
VLDL: 19.8 mg/dL (ref 0.0–40.0)

## 2021-06-01 LAB — CBC
HCT: 40.1 % (ref 36.0–46.0)
Hemoglobin: 13.1 g/dL (ref 12.0–15.0)
MCHC: 32.6 g/dL (ref 30.0–36.0)
MCV: 92 fl (ref 78.0–100.0)
Platelets: 188 10*3/uL (ref 150.0–400.0)
RBC: 4.36 Mil/uL (ref 3.87–5.11)
RDW: 13.9 % (ref 11.5–15.5)
WBC: 7.1 10*3/uL (ref 4.0–10.5)

## 2021-06-01 LAB — COMPREHENSIVE METABOLIC PANEL
ALT: 11 U/L (ref 0–35)
AST: 16 U/L (ref 0–37)
Albumin: 4.2 g/dL (ref 3.5–5.2)
Alkaline Phosphatase: 71 U/L (ref 39–117)
BUN: 13 mg/dL (ref 6–23)
CO2: 31 mEq/L (ref 19–32)
Calcium: 9.1 mg/dL (ref 8.4–10.5)
Chloride: 104 mEq/L (ref 96–112)
Creatinine, Ser: 0.64 mg/dL (ref 0.40–1.20)
GFR: 82.18 mL/min (ref >60.00)
Glucose, Bld: 98 mg/dL (ref 70–99)
Potassium: 4.5 mEq/L (ref 3.5–5.1)
Sodium: 142 mEq/L (ref 135–145)
Total Bilirubin: 0.8 mg/dL (ref 0.2–1.2)
Total Protein: 6.7 g/dL (ref 6.0–8.3)

## 2021-06-01 LAB — TSH: TSH: 2.32 u[IU]/mL (ref 0.35–5.50)

## 2021-06-03 ENCOUNTER — Other Ambulatory Visit: Payer: Self-pay

## 2021-06-03 DIAGNOSIS — Q682 Congenital deformity of knee: Secondary | ICD-10-CM

## 2021-08-04 ENCOUNTER — Other Ambulatory Visit: Payer: Self-pay

## 2021-08-04 ENCOUNTER — Telehealth: Payer: Self-pay | Admitting: Family Medicine

## 2021-08-04 MED ORDER — GABAPENTIN 800 MG PO TABS: 800.0000 mg | ORAL_TABLET | Freq: Two times a day (BID) | ORAL | 1 refills | Status: DC

## 2021-08-04 NOTE — Telephone Encounter (Signed)
Refill sent.

## 2021-08-04 NOTE — Telephone Encounter (Signed)
Medication:   gabapentin (NEURONTIN) 800 MG tablet [[id number]]   Has the patient contacted their pharmacy? Yes.   (If no, request that the patient contact the pharmacy for the refill.) (If yes, when and what did the pharmacy advise?)   Contact PCP  Preferred Pharmacy (with phone number or street name):   [id number] - [place], [place] - [address] AT [address]  [address], [place] [address]  Phone:  [phone]  Fax:  [phone]   Agent: Please be advised that RX refills may take up to 3 business days. We ask that you follow-up with your pharmacy.

## 2021-08-16 ENCOUNTER — Other Ambulatory Visit: Payer: Self-pay

## 2021-08-16 MED ORDER — OMEPRAZOLE 40 MG PO CPDR: 40.0000 mg | DELAYED_RELEASE_CAPSULE | Freq: Every day | ORAL | 1 refills | Status: DC

## 2021-09-28 DIAGNOSIS — H43813 Vitreous degeneration, bilateral: Secondary | ICD-10-CM | POA: Diagnosis not present

## 2021-09-28 DIAGNOSIS — H02834 Dermatochalasis of left upper eyelid: Secondary | ICD-10-CM | POA: Diagnosis not present

## 2021-09-28 DIAGNOSIS — H35363 Drusen (degenerative) of macula, bilateral: Secondary | ICD-10-CM | POA: Diagnosis not present

## 2021-09-28 DIAGNOSIS — H5203 Hypermetropia, bilateral: Secondary | ICD-10-CM | POA: Diagnosis not present

## 2021-09-28 DIAGNOSIS — H04123 Dry eye syndrome of bilateral lacrimal glands: Secondary | ICD-10-CM | POA: Diagnosis not present

## 2021-09-28 DIAGNOSIS — H524 Presbyopia: Secondary | ICD-10-CM | POA: Diagnosis not present

## 2021-09-28 DIAGNOSIS — H35033 Hypertensive retinopathy, bilateral: Secondary | ICD-10-CM | POA: Diagnosis not present

## 2021-09-28 DIAGNOSIS — H353131 Nonexudative age-related macular degeneration, bilateral, early dry stage: Secondary | ICD-10-CM | POA: Diagnosis not present

## 2021-09-28 DIAGNOSIS — H02831 Dermatochalasis of right upper eyelid: Secondary | ICD-10-CM | POA: Diagnosis not present

## 2021-09-28 DIAGNOSIS — H18513 Endothelial corneal dystrophy, bilateral: Secondary | ICD-10-CM | POA: Diagnosis not present

## 2021-09-28 DIAGNOSIS — H401132 Primary open-angle glaucoma, bilateral, moderate stage: Secondary | ICD-10-CM | POA: Diagnosis not present

## 2021-10-11 ENCOUNTER — Other Ambulatory Visit: Payer: Self-pay

## 2021-10-11 ENCOUNTER — Telehealth: Payer: Self-pay | Admitting: Family Medicine

## 2021-10-11 NOTE — Telephone Encounter (Signed)
Pt is changing pharmacy's for her next refills.   [facility], [place], [place] [address]

## 2021-10-11 NOTE — Telephone Encounter (Signed)
done

## 2021-10-28 DIAGNOSIS — Z23 Encounter for immunization: Secondary | ICD-10-CM | POA: Diagnosis not present

## 2021-11-04 DIAGNOSIS — Z23 Encounter for immunization: Secondary | ICD-10-CM | POA: Diagnosis not present

## 2021-11-16 ENCOUNTER — Other Ambulatory Visit: Payer: Self-pay | Admitting: Cardiology

## 2021-11-16 NOTE — Telephone Encounter (Signed)
Pt last saw Dr Percival Spanish 01/17/21, last labs 06/01/21 Creat 0.64, age 81, weight 76.2kg, based on specified criteria pt is on appropriate dosage of Eliquis '5mg'$  BID for afib.  Will refill rx.

## 2021-12-06 DIAGNOSIS — R931 Abnormal findings on diagnostic imaging of heart and coronary circulation: Secondary | ICD-10-CM | POA: Insufficient documentation

## 2021-12-06 DIAGNOSIS — I4819 Other persistent atrial fibrillation: Secondary | ICD-10-CM | POA: Insufficient documentation
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
## 2021-12-07 ENCOUNTER — Ambulatory Visit: Payer: Medicare Other | Attending: Cardiology | Admitting: Cardiology

## 2021-12-07 ENCOUNTER — Encounter: Payer: Self-pay | Admitting: Cardiology

## 2021-12-07 ENCOUNTER — Ambulatory Visit (INDEPENDENT_AMBULATORY_CARE_PROVIDER_SITE_OTHER): Payer: Medicare Other

## 2021-12-07 VITALS — BP 152/80 | HR 64 | Ht 65.0 in | Wt 169.0 lb

## 2021-12-07 DIAGNOSIS — I10 Essential (primary) hypertension: Secondary | ICD-10-CM | POA: Insufficient documentation

## 2021-12-07 DIAGNOSIS — I4819 Other persistent atrial fibrillation: Secondary | ICD-10-CM

## 2021-12-07 DIAGNOSIS — R931 Abnormal findings on diagnostic imaging of heart and coronary circulation: Secondary | ICD-10-CM | POA: Diagnosis not present

## 2021-12-07 DIAGNOSIS — Z952 Presence of prosthetic heart valve: Secondary | ICD-10-CM | POA: Diagnosis not present

## 2021-12-07 MED ORDER — AMLODIPINE BESYLATE 2.5 MG PO TABS: 2.5000 mg | ORAL_TABLET | Freq: Every day | ORAL | 3 refills | Status: DC

## 2021-12-07 NOTE — Patient Instructions (Signed)
Medication Instructions:  START Amlodipine 2.5 mg once daily  *If you need a refill on your cardiac medications before your next appointment, please call your pharmacy*   Lab Work: None ordered If you have labs (blood work) drawn today and your tests are completely normal, you will receive your results only by: [place] (if you have MyChart) OR A paper copy in the mail If you have any lab test that is abnormal or we need to change your treatment, we will call you to review the results.   Testing/Procedures: Your physician has requested that you have an echocardiogram in September 2024. Echocardiography is a painless test that uses sound waves to create images of your heart. It provides your doctor with information about the size and shape of your heart and how well your heart's chambers and valves are working. You may receive an ultrasound enhancing agent through an IV if needed to better visualize your heart during the echo.This procedure takes approximately one hour. There are no restrictions for this procedure. This will take place at the [address], [address].     Follow-Up: At [facility], you and your health needs are our priority.  As part of our continuing mission to provide you with exceptional heart care, we have created designated Provider Care Teams.  These Care Teams include your primary Cardiologist (physician) and Advanced Practice Providers (APPs -  Physician Assistants and Nurse Practitioners) who all work together to provide you with the care you need, when you need it.  We recommend signing up for the patient portal called "MyChart".  Sign up information is provided on this After Visit Summary.  MyChart is used to connect with patients for Virtual Visits (Telemedicine).  Patients are able to view lab/test results, encounter notes, upcoming appointments, etc.  Non-urgent messages can be sent to your provider as well.   To learn more about what you can  do with MyChart, go to [email or web address].    Your next appointment:   12 month(s)  The format for your next appointment:   In Person  Provider:   Minus Breeding, MD     Other Instructions [place] Monitor Instructions  Your physician has requested you wear a ZIO patch monitor for 14 days.  This is a single patch monitor. [employer] supplies one patch monitor per enrollment. Additional stickers are not available. Please do not apply patch if you will be having a Nuclear Stress Test,  Echocardiogram, Cardiac CT, MRI, or Chest Xray during the period you would be wearing the  monitor. The patch cannot be worn during these tests. You cannot remove and re-apply the  ZIO XT patch monitor.  Your ZIO patch monitor will be mailed 3 day USPS to your address on file. It may take 3-5 days  to receive your monitor after you have been enrolled.  Once you have received your monitor, please review the enclosed instructions. Your monitor  has already been registered assigning a specific monitor serial # to you.  Billing and Patient Assistance Program Information  We have supplied [employer] with any of your insurance information on file for billing purposes. [employer] offers a sliding scale Patient Assistance Program for patients that do not have  insurance, or whose insurance does not completely cover the cost of the ZIO monitor.  You must apply for the Patient Assistance Program to qualify for this discounted rate.  To apply, please call [employer] at [phone], select option 4, select option  2, ask to apply for  Patient Assistance Program. Theodore Demark will ask your household income, and how many people  are in your household. They will quote your out-of-pocket cost based on that information.  [employer] will also be able to set up a 67-month interest-free payment plan if needed.  Applying the monitor   Shave hair from upper left chest.  Hold abrader disc by orange tab. Rub abrader in 40  strokes over the upper left chest as  indicated in your monitor instructions.  Clean area with 4 enclosed alcohol pads. Let dry.  Apply patch as indicated in monitor instructions. Patch will be placed under collarbone on left  side of chest with arrow pointing upward.  Rub patch adhesive wings for 2 minutes. Remove white label marked "1". Remove the white  label marked "2". Rub patch adhesive wings for 2 additional minutes.  While looking in a mirror, press and release button in center of patch. A small green light will  flash 3-4 times. This will be your only indicator that the monitor has been turned on.  Do not shower for the first 24 hours. You may shower after the first 24 hours.  Press the button if you feel a symptom. You will hear a small click. Record Date, Time and  Symptom in the Patient Logbook.  When you are ready to remove the patch, follow instructions on the last 2 pages of Patient  Logbook. Stick patch monitor onto the last page of Patient Logbook.  Place Patient Logbook in the blue and white box. Use locking tab on box and tape box closed  securely. The blue and white box has prepaid postage on it. Please place it in the mailbox as  soon as possible. Your physician should have your test results approximately 7 days after the  monitor has been mailed back to [facility]  Call [place] [phone] you have questions regarding  your ZIO XT patch monitor. Call them immediately if you see an orange light blinking on your  monitor.  If your monitor falls off in less than 4 days, contact our Monitor department at [phone]  If your monitor becomes loose or falls off after 4 days call [employer] at [phone]  suggestions on securing your monitor

## 2021-12-07 NOTE — Progress Notes (Unsigned)
Enrolled patient for a 14 day Zio XT  monitor to be mailed to patients home  °

## 2021-12-11 DIAGNOSIS — I4819 Other persistent atrial fibrillation: Secondary | ICD-10-CM | POA: Diagnosis not present

## 2021-12-27 ENCOUNTER — Telehealth: Payer: Self-pay | Admitting: Cardiology

## 2021-12-27 NOTE — Telephone Encounter (Signed)
Pt c/o BP issue: STAT if pt c/o blurred vision, one-sided weakness or slurred speech  1. What are your last 5 BP readings?   Pt takes her medication at night  11/5: 1327/74 11/6: 141/75 11/7: 131/74 11/8: 120/85 11/9: 110/75 11/10: 163/73 11/11: 151/73 11/12: 116/77 11/13: 134/79 11/14: 129/69 11/15: 128/74 11/16: 133/87  2. Are you having any other symptoms (ex. Dizziness, headache, blurred vision, passed out)? No   3. What is your BP issue?  Pt told to c/b to bp reading after stating new bp medication (amlodipine)

## 2021-12-27 NOTE — Telephone Encounter (Signed)
Spoke with pt regarding blood pressure readings that she provided to [place]. Pt does not remember anything specific in regards to 2 days where readings were elevated to 671-245 systolic. Will forward readings to Dr. Percival Spanish to further advise. Pt verbalizes understanding.

## 2022-01-02 NOTE — Telephone Encounter (Signed)
Minus Breeding, MD  You5 days ago    Although these fluctuate somewhat, no change in therapy is needed.  Overall OK.     Called pt in regards to Dr. Rosezella [place] recommendations. She verbalizes understanding.

## 2022-01-04 DIAGNOSIS — I4819 Other persistent atrial fibrillation: Secondary | ICD-10-CM | POA: Diagnosis not present

## 2022-01-17 ENCOUNTER — Encounter: Payer: Self-pay | Admitting: Cardiology

## 2022-01-17 NOTE — Telephone Encounter (Signed)
Patient is following up, again requesting a call back to discuss monitor results.

## 2022-01-17 NOTE — Telephone Encounter (Signed)
Spoke with patient regarding the following results. Patient made aware and patient verbalized understanding.     Minus Breeding, MD 01/08/2022  9:57 AM EST     No evidence of atrial fib.  No change in therapy.  No indication for anticoagulation.  Call Ms. Zawistowski with the results and send results to Mosie Lukes, MD    Called and spoke with patient who states that she saw the above message on MyChart. Patient reports that she is still very anxious about the other findings on the monitor- ]  Predominant rhythm is normal sinus Infrequent non sustained ventricular tachycardia Longest run lasted six beats.  Frequent runs of SVT.  With the longest run being 23.3 seconds Rare ventricular ectopy.    Went over these findings with patient who states that she does get woken up from time to time with palpitations and reports that this does bother her intermittently but not every day. Patient reports she does still take Metoprolol Tart '25mg'$  Bid. Patient would like to know if she needs to do anything different regarding these findings and her meds. Advised patient I would forward message to Dr. Percival Spanish for him to review and advise. Patient verbalized understanding.

## 2022-01-21 ENCOUNTER — Other Ambulatory Visit: Payer: Self-pay | Admitting: Cardiology

## 2022-01-30 ENCOUNTER — Other Ambulatory Visit: Payer: Self-pay | Admitting: Family Medicine

## 2022-02-15 DIAGNOSIS — H401132 Primary open-angle glaucoma, bilateral, moderate stage: Secondary | ICD-10-CM | POA: Diagnosis not present

## 2022-02-15 DIAGNOSIS — H04123 Dry eye syndrome of bilateral lacrimal glands: Secondary | ICD-10-CM | POA: Diagnosis not present

## 2022-02-15 DIAGNOSIS — H18513 Endothelial corneal dystrophy, bilateral: Secondary | ICD-10-CM | POA: Diagnosis not present

## 2022-03-05 ENCOUNTER — Other Ambulatory Visit: Payer: Self-pay | Admitting: Family Medicine

## 2022-03-16 ENCOUNTER — Encounter (HOSPITAL_COMMUNITY): Payer: Self-pay | Admitting: *Deleted

## 2022-04-20 ENCOUNTER — Ambulatory Visit (INDEPENDENT_AMBULATORY_CARE_PROVIDER_SITE_OTHER): Payer: Medicare Other | Admitting: Podiatry

## 2022-04-20 DIAGNOSIS — M7742 Metatarsalgia, left foot: Secondary | ICD-10-CM | POA: Diagnosis not present

## 2022-04-20 DIAGNOSIS — M2041 Other hammer toe(s) (acquired), right foot: Secondary | ICD-10-CM | POA: Diagnosis not present

## 2022-04-20 DIAGNOSIS — G609 Hereditary and idiopathic neuropathy, unspecified: Secondary | ICD-10-CM

## 2022-04-20 DIAGNOSIS — M2042 Other hammer toe(s) (acquired), left foot: Secondary | ICD-10-CM | POA: Diagnosis not present

## 2022-04-20 DIAGNOSIS — M7741 Metatarsalgia, right foot: Secondary | ICD-10-CM

## 2022-04-20 NOTE — Patient Instructions (Signed)
Talk to your primary care doctor about increasing your gabapentin dosing to three times daily to help with the neuropathy pain, she can also have your B12 and Folate checked again to see if this is a factor   More silicone pads can be purchased from:  [email or web address]

## 2022-04-24 ENCOUNTER — Telehealth: Payer: Self-pay | Admitting: Family Medicine

## 2022-04-24 ENCOUNTER — Encounter: Payer: Self-pay | Admitting: Podiatry

## 2022-04-24 ENCOUNTER — Ambulatory Visit (INDEPENDENT_AMBULATORY_CARE_PROVIDER_SITE_OTHER): Payer: Medicare Other | Admitting: Family Medicine

## 2022-04-24 ENCOUNTER — Encounter: Payer: Self-pay | Admitting: Family Medicine

## 2022-04-24 VITALS — BP 137/49 | HR 64 | Resp 18 | Ht 65.0 in | Wt 170.2 lb

## 2022-04-24 DIAGNOSIS — G629 Polyneuropathy, unspecified: Secondary | ICD-10-CM | POA: Diagnosis not present

## 2022-04-24 DIAGNOSIS — M549 Dorsalgia, unspecified: Secondary | ICD-10-CM

## 2022-04-24 DIAGNOSIS — I10 Essential (primary) hypertension: Secondary | ICD-10-CM | POA: Diagnosis not present

## 2022-04-24 DIAGNOSIS — G6289 Other specified polyneuropathies: Secondary | ICD-10-CM

## 2022-04-24 DIAGNOSIS — E785 Hyperlipidemia, unspecified: Secondary | ICD-10-CM

## 2022-04-24 LAB — CBC WITH DIFFERENTIAL/PLATELET
Basophils Absolute: 0 10*3/uL (ref 0.0–0.1)
Basophils Relative: 0.6 % (ref 0.0–3.0)
Eosinophils Absolute: 0.1 10*3/uL (ref 0.0–0.7)
Eosinophils Relative: 1.7 % (ref 0.0–5.0)
HCT: 40.5 % (ref 36.0–46.0)
Hemoglobin: 13.4 g/dL (ref 12.0–15.0)
Lymphocytes Relative: 18.9 % (ref 12.0–46.0)
Lymphs Abs: 1.3 10*3/uL (ref 0.7–4.0)
MCHC: 33.2 g/dL (ref 30.0–36.0)
MCV: 91.9 fl (ref 78.0–100.0)
Monocytes Absolute: 0.5 10*3/uL (ref 0.1–1.0)
Monocytes Relative: 7.7 % (ref 3.0–12.0)
Neutro Abs: 4.9 10*3/uL (ref 1.4–7.7)
Neutrophils Relative %: 71.1 % (ref 43.0–77.0)
Platelets: 214 10*3/uL (ref 150.0–400.0)
RBC: 4.4 Mil/uL (ref 3.87–5.11)
RDW: 13.8 % (ref 11.5–15.5)
WBC: 6.9 10*3/uL (ref 4.0–10.5)

## 2022-04-24 LAB — COMPREHENSIVE METABOLIC PANEL
ALT: 11 U/L (ref 0–35)
AST: 16 U/L (ref 0–37)
Albumin: 4.1 g/dL (ref 3.5–5.2)
Alkaline Phosphatase: 85 U/L (ref 39–117)
BUN: 12 mg/dL (ref 6–23)
CO2: 29 mEq/L (ref 19–32)
Calcium: 9.5 mg/dL (ref 8.4–10.5)
Chloride: 104 mEq/L (ref 96–112)
Creatinine, Ser: 0.62 mg/dL (ref 0.40–1.20)
GFR: 82.29 mL/min (ref >60.00)
Glucose, Bld: 91 mg/dL (ref 70–99)
Potassium: 4.3 mEq/L (ref 3.5–5.1)
Sodium: 142 mEq/L (ref 135–145)
Total Bilirubin: 0.6 mg/dL (ref 0.2–1.2)
Total Protein: 6.9 g/dL (ref 6.0–8.3)

## 2022-04-24 LAB — TSH: TSH: 1.67 u[IU]/mL (ref 0.35–5.50)

## 2022-04-24 LAB — LIPID PANEL
Cholesterol: 203 mg/dL — ABNORMAL HIGH (ref 0–200)
HDL: 60.6 mg/dL (ref >39.00)
LDL Cholesterol: 114 mg/dL — ABNORMAL HIGH (ref 0–99)
NonHDL: 142.46
Total CHOL/HDL Ratio: 3
Triglycerides: 143 mg/dL (ref 0.0–149.0)
VLDL: 28.6 mg/dL (ref 0.0–40.0)

## 2022-04-24 LAB — B12 AND FOLATE PANEL
Folate: >23.8 ng/mL (ref >5.9)
Vitamin B-12: 426 pg/mL (ref 211–911)

## 2022-04-24 MED ORDER — GABAPENTIN 600 MG PO TABS: 600.0000 mg | ORAL_TABLET | Freq: Three times a day (TID) | ORAL | 0 refills | Status: DC

## 2022-04-24 MED ORDER — GABAPENTIN 300 MG PO CAPS: 600.0000 mg | ORAL_CAPSULE | Freq: Three times a day (TID) | ORAL | 0 refills | Status: DC

## 2022-04-24 MED ORDER — LIDOCAINE-PRILOCAINE 2.5-2.5 % EX CREA: 1.0000 | TOPICAL_CREAM | CUTANEOUS | 0 refills | Status: DC | PRN

## 2022-04-24 NOTE — Patient Instructions (Signed)
Increasing your gabapentin to 300 mg three times a day to see if this helps your back pain and neuropathy. Labs today. Continue following with podiatry.

## 2022-04-24 NOTE — Telephone Encounter (Signed)
Her AVS says 300mg  tid and bottle says 600mg  tid.  Is the 600mg  tid correct?  Old rx was for 800mg  bid.  No answer/ no vm

## 2022-04-24 NOTE — Telephone Encounter (Signed)
Patient would like for her prescription to be for the 600 mgs instead of the 300 mgs due to her already taking a lot of pills and not wanting to add 6 pills to her stacks. She would like a call to know if this can be done. Please advise.

## 2022-04-24 NOTE — Progress Notes (Signed)
Acute Office Visit  Subjective:     Patient ID: Mikayla [name], female    DOB: 1940/11/25, 82 y.o.   MRN: [id number]  Chief Complaint  Patient presents with   Follow-up    Would like to up gabapentin dosage  Check vitamin 123456 and folic acid     HPI Patient is in today for neuropathy.   Neuropathy: Patient saw her podiatrist on 04/20/22 with worsening peripheral neuropathy. They discussed asking PCP to increase gabapentin and check 123456 and folic acid. They suspected there was an age-related component as well. He also gave her topical lidocaine. Today she reports that she isn't notice as significant of a benefit from the gabapentin as she used to when she first started taking it. She has not been having any side effects. Pain is most significant in left great toe, but some symptoms throughout both feet. Worse in the evenings, sometimes will be bad enough to wake her up at night. (She was also taking the gabapentin for chronic back pain).  Hypertension/HLD: - Medications: amlodipine 2.5 mg daily, metoprolol 25 mg BID,  - Compliance: good - Denies any SOB, recurrent headaches, CP, vision changes, LE edema, dizziness, palpitations, or medication side effects.     ROS All review of systems negative except what is listed in the HPI      Objective:    BP (!) 137/49   Pulse 64   Resp 18   Ht 5\' 5"  (1.651 m)   Wt 170 lb 3.2 oz (77.2 kg)   SpO2 100%   BMI 28.32 kg/m    Physical Exam Vitals reviewed.  Constitutional:      Appearance: Normal appearance.  Cardiovascular:     Rate and Rhythm: Normal rate and regular rhythm.     Pulses: Normal pulses.     Heart sounds: Normal heart sounds.  Pulmonary:     Effort: Pulmonary effort is normal.     Breath sounds: Normal breath sounds.  Musculoskeletal:     Right lower leg: No edema.     Left lower leg: No edema.  Skin:    General: Skin is warm and dry.  Neurological:     Mental Status: She is alert and oriented to person,  place, and time.  Psychiatric:        Mood and Affect: Mood normal.        Behavior: Behavior normal.        Thought Content: Thought content normal.        Judgment: Judgment normal.     No results found for any visits on 04/24/22.      Assessment & Plan:   Problem List Items Addressed This Visit       Cardiovascular and Mediastinum   Benign essential HTN - Primary Stable. Continue current meds and lifestyle measures.    Relevant Orders   CBC with Differential/Platelet   Comprehensive metabolic panel   Lipid panel   TSH     Other   Hyperlipidemia Labs today. Continue lifestyle measures.    Relevant Orders   Comprehensive metabolic panel   Lipid panel            Other Visit Diagnoses     Peripheral polyneuropathy      Back pain  Increasing gabapentin to 600 mg TID to see if this will give some relief to back pain and neuropathy. Labs today. Patient aware of signs/symptoms requiring further/urgent evaluation.    Relevant Medications   gabapentin (NEURONTIN) 300  MG capsule   Other Relevant Orders   B12 and Folate Panel   CBC with Differential/Platelet       Meds ordered this encounter  Medications   gabapentin (NEURONTIN) 300 MG capsule    Sig: Take 2 capsules (600 mg total) by mouth 3 (three) times daily.    Dispense:  540 capsule    Refill:  0    Order Specific Question:   Supervising Provider    Answer:   Penni Homans A [[id number]]    Return in about 3 months (around 07/25/2022) for routine follow-up PCP.  Terrilyn Saver, NP

## 2022-04-24 NOTE — Telephone Encounter (Signed)
Patient states she saw Caleen Jobs today and per visit summary it says that they are increasing the gabapentin to a 300mg  pill 3 times a day. Patient states that is not an increase, per her last dosage. Informed patient that per the instructions on the new prescription sent to her pharmacy, she needs to take 2 pills, 3 times a day. She would like clarification since the AVS said something different. Please advise.

## 2022-04-24 NOTE — Progress Notes (Signed)
  Subjective:  Patient ID: Mikayla [name], female    DOB: 1940/04/12,  MRN: [id number]  Burning tingling pain along big toe and ball of foot  82 y.o. female presents with the above complaint. History confirmed with patient.  This been going on for several weeks now, feels like it is worsening.  Notes that it mostly is around the big toe but is also tender in the ball of the foot and along the tips of the toes where there are calluses.  Objective:  Physical Exam: warm, good capillary refill, no trophic changes or ulcerative lesions, normal DP and PT pulses, abnormal sensory exam with loss of protective sensation to monofilament, and bilateral hallux valgus deformity, digital contractures and thinning of the metatarsal fat pad and calluses noted on tips of toes.   Assessment:   1. Idiopathic neuropathy   2. Metatarsalgia of both feet   3. Hammertoe of right foot   4. Hammertoe of left foot      Plan:  Patient was evaluated and treated and all questions answered.  We discussed that she has worsening symptoms of peripheral neuropathy, she does have a history of spine issues, she takes gabapentin twice daily now and has room to increase this she will discuss this with her PCP.  She also has an upcoming appointment and will have them check her B12 and folate, her previous check in 2018 was normal.  Suspect some of this may be age-related neuropathy as well.  She does have multiple deformities and contractures noted that contribute to this we discussed offloading with silicone pads and these were dispensed.  At her age and functional level I do not think that surgical reconstruction of her foot deformities is advisable.  Also discussed shoe gear that can be beneficial.  Lidocaine prilocaine ointment Rx sent to pharmacy to use topically as needed.  We discussed the option also of a compounded cream if she would like this as well.  No follow-ups on file.

## 2022-04-25 ENCOUNTER — Other Ambulatory Visit: Payer: Self-pay

## 2022-04-25 MED ORDER — NONFORMULARY OR COMPOUNDED ITEM: 3 refills | Status: DC

## 2022-04-25 NOTE — Telephone Encounter (Signed)
Pt notified that rx has been sent in 

## 2022-05-08 ENCOUNTER — Encounter: Payer: Self-pay | Admitting: Podiatry

## 2022-05-08 ENCOUNTER — Ambulatory Visit (INDEPENDENT_AMBULATORY_CARE_PROVIDER_SITE_OTHER): Payer: Medicare Other | Admitting: *Deleted

## 2022-05-08 ENCOUNTER — Encounter: Payer: Self-pay | Admitting: Family Medicine

## 2022-05-08 DIAGNOSIS — Z0000 Encounter for general adult medical examination without abnormal findings: Secondary | ICD-10-CM
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
## 2022-05-08 NOTE — Telephone Encounter (Signed)
Shingles and Covid has been updated in  Pt chart

## 2022-05-08 NOTE — Patient Instructions (Signed)
Mikayla [name] , Thank you for taking time to come for your Medicare Wellness Visit. I appreciate your ongoing commitment to your health goals. Please review the following plan we discussed and let me know if I can assist you in the future.      This is a list of the screening recommended for you and due dates:  Health Maintenance  Topic Date Due   Zoster (Shingles) Vaccine (1 of 2) Never done   COVID-19 Vaccine (6 - 2023-24 season) 10/07/2021   DTaP/Tdap/Td vaccine (3 - Td or Tdap) 12/06/2021   Flu Shot  09/07/2022   Medicare Annual Wellness Visit  05/08/2023   Pneumonia Vaccine  Completed   DEXA scan (bone density measurement)  Completed   HPV Vaccine  Aged Out    Next appointment: Follow up in one year for your annual wellness visit.   Preventive Care 84 Years and Older, Female Preventive care refers to lifestyle choices and visits with your health care provider that can promote health and wellness. What does preventive care include? A yearly physical exam. This is also called an annual well check. Dental exams once or twice a year. Routine eye exams. Ask your health care provider how often you should have your eyes checked. Personal lifestyle choices, including: Daily care of your teeth and gums. Regular physical activity. Eating a healthy diet. Avoiding tobacco and drug use. Limiting alcohol use. Practicing safe sex. Taking low-dose aspirin every day. Taking vitamin and mineral supplements as recommended by your health care provider. What happens during an annual well check? The services and screenings done by your health care provider during your annual well check will depend on your age, overall health, lifestyle risk factors, and family history of disease. Counseling  Your health care provider may ask you questions about your: Alcohol use. Tobacco use. Drug use. Emotional well-being. Home and relationship well-being. Sexual activity. Eating habits. History of  falls. Memory and ability to understand (cognition). Work and work Statistician. Reproductive health. Screening  You may have the following tests or measurements: Height, weight, and BMI. Blood pressure. Lipid and cholesterol levels. These may be checked every 5 years, or more frequently if you are over 84 years old. Skin check. Lung cancer screening. You may have this screening every year starting at age 18 if you have a 30-pack-year history of smoking and currently smoke or have quit within the past 15 years. Fecal occult blood test (FOBT) of the stool. You may have this test every year starting at age 62. Flexible sigmoidoscopy or colonoscopy. You may have a sigmoidoscopy every 5 years or a colonoscopy every 10 years starting at age 84. Hepatitis C blood test. Hepatitis B blood test. Sexually transmitted disease (STD) testing. Diabetes screening. This is done by checking your blood sugar (glucose) after you have not eaten for a while (fasting). You may have this done every 1-3 years. Bone density scan. This is done to screen for osteoporosis. You may have this done starting at age 84. Mammogram. This may be done every 1-2 years. Talk to your health care provider about how often you should have regular mammograms. Talk with your health care provider about your test results, treatment options, and if necessary, the need for more tests. Vaccines  Your health care provider may recommend certain vaccines, such as: Influenza vaccine. This is recommended every year. Tetanus, diphtheria, and acellular pertussis (Tdap, Td) vaccine. You may need a Td booster every 10 years. Zoster vaccine. You may need this  after age 78. Pneumococcal 13-valent conjugate (PCV13) vaccine. One dose is recommended after age 48. Pneumococcal polysaccharide (PPSV23) vaccine. One dose is recommended after age 84. Talk to your health care provider about which screenings and vaccines you need and how often you need  them. This information is not intended to replace advice given to you by your health care provider. Make sure you discuss any questions you have with your health care provider. Document Released: 02/19/2015 Document Revised: 10/13/2015 Document Reviewed: 11/24/2014 Elsevier Interactive Patient Education  2017 [place] Prevention in the Home Falls can cause injuries. They can happen to people of all ages. There are many things you can do to make your home safe and to help prevent falls. What can I do on the outside of my home? Regularly fix the edges of walkways and driveways and fix any cracks. Remove anything that might make you trip as you walk through a door, such as a raised step or threshold. Trim any bushes or trees on the path to your home. Use bright outdoor lighting. Clear any walking paths of anything that might make someone trip, such as rocks or tools. Regularly check to see if handrails are loose or broken. Make sure that both sides of any steps have handrails. Any raised decks and porches should have guardrails on the edges. Have any leaves, snow, or ice cleared regularly. Use sand or salt on walking paths during winter. Clean up any spills in your garage right away. This includes oil or grease spills. What can I do in the bathroom? Use night lights. Install grab bars by the toilet and in the tub and shower. Do not use towel bars as grab bars. Use non-skid mats or decals in the tub or shower. If you need to sit down in the shower, use a plastic, non-slip stool. Keep the floor dry. Clean up any water that spills on the floor as soon as it happens. Remove soap buildup in the tub or shower regularly. Attach bath mats securely with double-sided non-slip rug tape. Do not have throw rugs and other things on the floor that can make you trip. What can I do in the bedroom? Use night lights. Make sure that you have a light by your bed that is easy to reach. Do not use  any sheets or blankets that are too big for your bed. They should not hang down onto the floor. Have a firm chair that has side arms. You can use this for support while you get dressed. Do not have throw rugs and other things on the floor that can make you trip. What can I do in the kitchen? Clean up any spills right away. Avoid walking on wet floors. Keep items that you use a lot in easy-to-reach places. If you need to reach something above you, use a strong step stool that has a grab bar. Keep electrical cords out of the way. Do not use floor polish or wax that makes floors slippery. If you must use wax, use non-skid floor wax. Do not have throw rugs and other things on the floor that can make you trip. What can I do with my stairs? Do not leave any items on the stairs. Make sure that there are handrails on both sides of the stairs and use them. Fix handrails that are broken or loose. Make sure that handrails are as long as the stairways. Check any carpeting to make sure that it is firmly attached to the  stairs. Fix any carpet that is loose or worn. Avoid having throw rugs at the top or bottom of the stairs. If you do have throw rugs, attach them to the floor with carpet tape. Make sure that you have a light switch at the top of the stairs and the bottom of the stairs. If you do not have them, ask someone to add them for you. What else can I do to help prevent falls? Wear shoes that: Do not have high heels. Have rubber bottoms. Are comfortable and fit you well. Are closed at the toe. Do not wear sandals. If you use a stepladder: Make sure that it is fully opened. Do not climb a closed stepladder. Make sure that both sides of the stepladder are locked into place. Ask someone to hold it for you, if possible. Clearly mark and make sure that you can see: Any grab bars or handrails. First and last steps. Where the edge of each step is. Use tools that help you move around (mobility aids)  if they are needed. These include: Canes. Walkers. Scooters. Crutches. Turn on the lights when you go into a dark area. Replace any light bulbs as soon as they burn out. Set up your furniture so you have a clear path. Avoid moving your furniture around. If any of your floors are uneven, fix them. If there are any pets around you, be aware of where they are. Review your medicines with your doctor. Some medicines can make you feel dizzy. This can increase your chance of falling. Ask your doctor what other things that you can do to help prevent falls. This information is not intended to replace advice given to you by your health care provider. Make sure you discuss any questions you have with your health care provider. Document Released: 11/19/2008 Document Revised: 07/01/2015 Document Reviewed: 02/27/2014 Elsevier Interactive Patient Education  2017 [employer].

## 2022-05-29 ENCOUNTER — Other Ambulatory Visit: Payer: Self-pay | Admitting: Cardiology

## 2022-05-29 ENCOUNTER — Other Ambulatory Visit: Payer: Self-pay | Admitting: Family Medicine

## 2022-05-29 NOTE — Telephone Encounter (Signed)
Prescription refill request for Eliquis received. Indication: PAF Last office visit: 11/06/21  Daiva Nakayama MD Scr: 0.62 Age: 82 Weight: 76.7kg  Based on above findings Eliquis  twice daily is the appropriate dose.  Per Dr Hochrein's report from below: Eliquis could have been discontinued.  Pt pt and she is still taking Eliquis and has not heard anything about stopping it.  Refill approved per pt's request and message sent to Dr Antoine Poche to see if pt should continue or stop Eliquis.  Rolly Salter, MD 01/08/2022  9:57 AM EST     No evidence of atrial fib.  No change in therapy.  No indication for anticoagulation.  Call Ms. [place] with the results and send results to Bradd Canary, MD

## 2022-05-31 MED ORDER — APIXABAN 5 MG PO TABS: 5.0000 mg | ORAL_TABLET | Freq: Two times a day (BID) | ORAL | 3 refills | Status: DC

## 2022-05-31 NOTE — Telephone Encounter (Signed)
Spoke with pt daughter, she reports the patient continues to take eliquis because according to how the patient feels and the [place] mobile she continues to have episodes of atrial fib. Ask daughter to please forward the strips from the [place] so we can confirm it is atrial fib she is having. Will let dr hochrein know and send refill to the pharmacy.

## 2022-06-26 IMAGING — CT CT HEART MORPH/PULM VEIN W/ CM & W/O CA SCORE
1 series · 2 of 4 positions shown, 3 images · non-contrast
Comparison: None.
COMPARISON: None.

Addendum:
EXAM:
OVER-READ INTERPRETATION  CT CHEST

The following report is an over-read performed by radiologist Dr.
Kittie Cotner [REDACTED] on 10/06/2019. This
over-read does not include interpretation of cardiac or coronary
anatomy or pathology. The coronary calcium score/coronary CTA
interpretation by the cardiologist is attached.
CLINICAL DATA: Atrial fibrillation scheduled for an ablation.
Cardiac CT/CTA
TECHNIQUE: The patient was scanned on a Siemens Somatom scanner.

[Series 654: pulmonary veins · 0.11mm/px · 2 of 4 slices shown, 3 images]
[im 2/4  vessel]
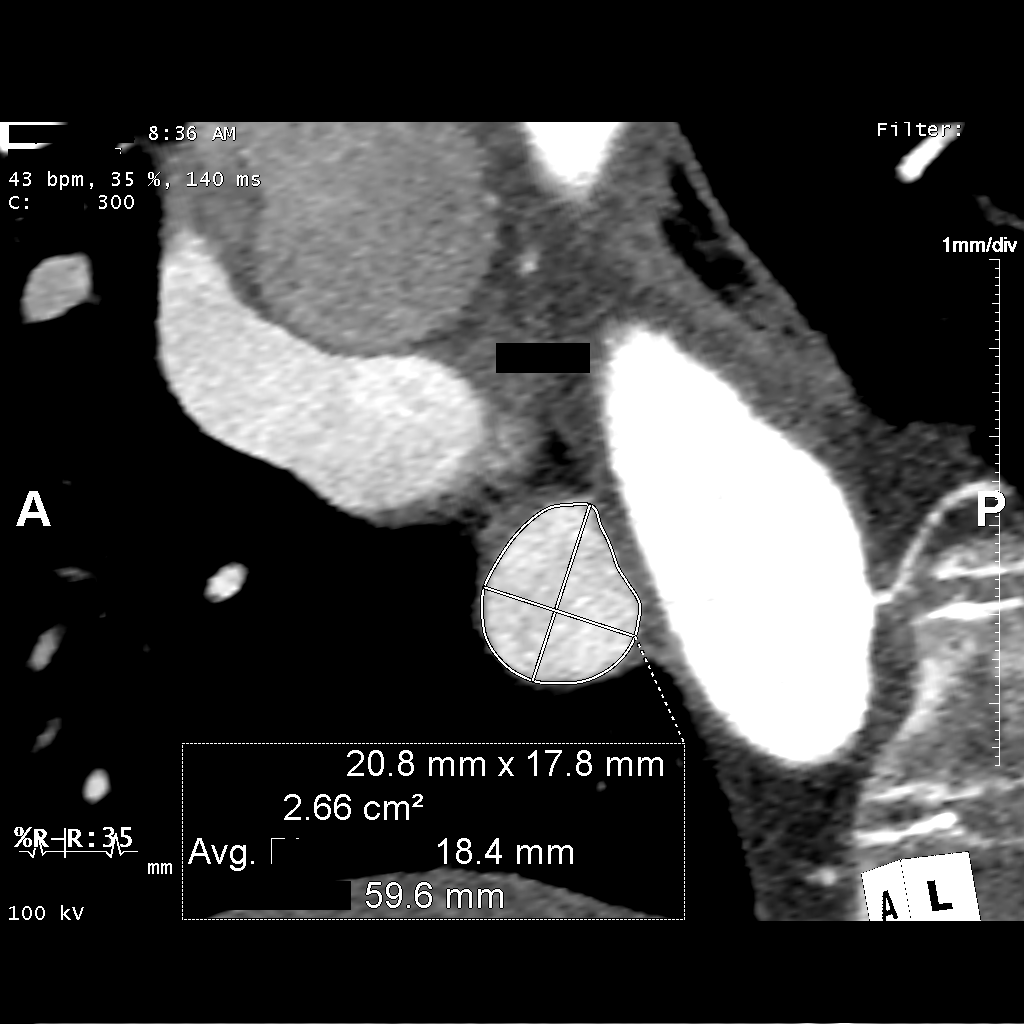
[im 2/4  lung]
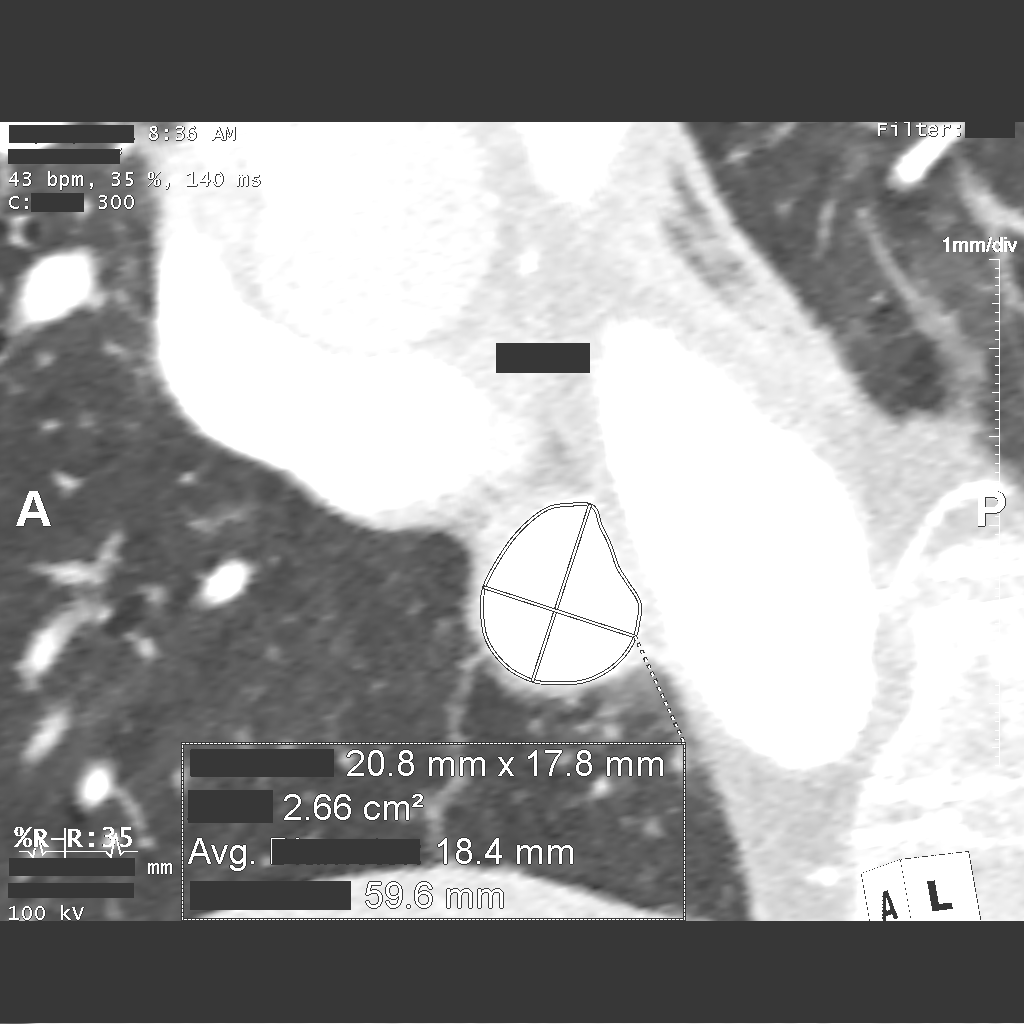
[im 3/4  vessel]
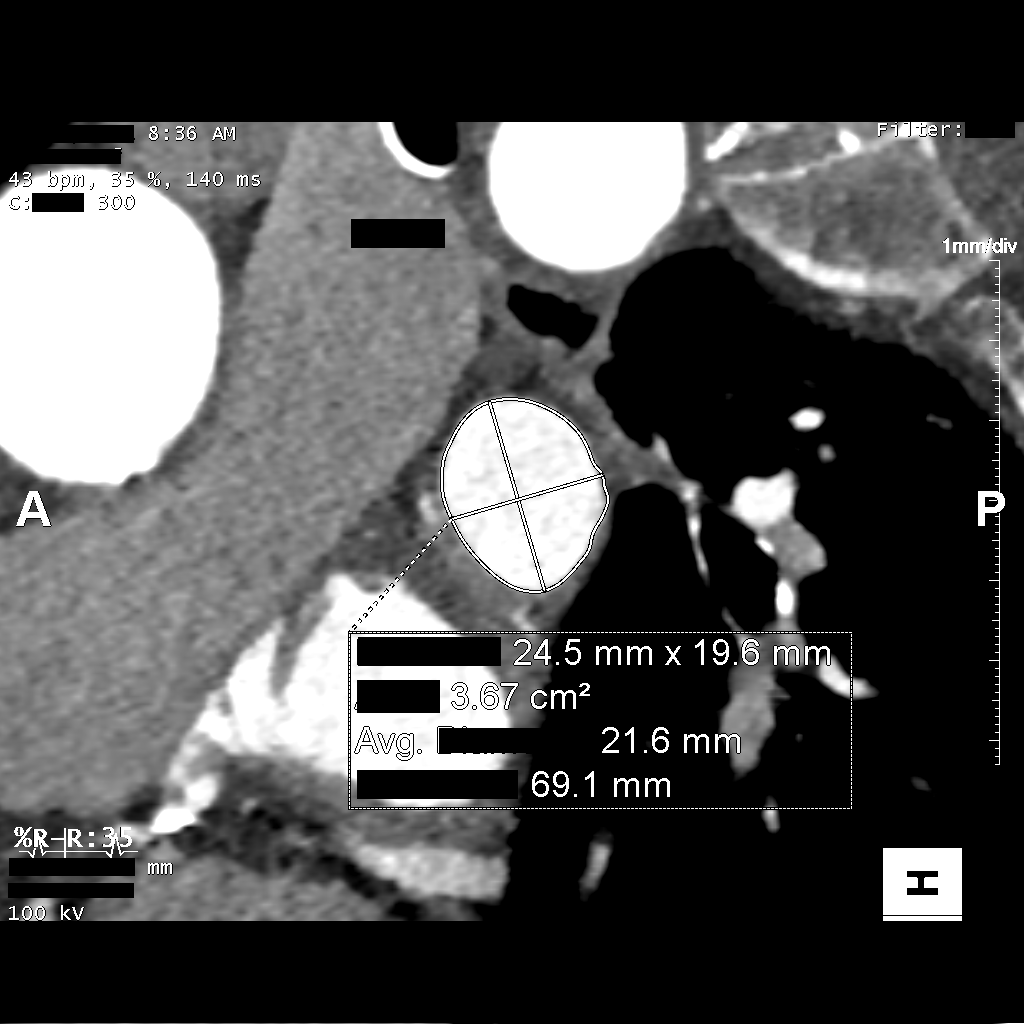

[2 of 4 positions shown; findings below may reference images not displayed]

FINDINGS: Aortic atherosclerosis. Within the visualized portions of the thorax
there are no suspicious appearing pulmonary nodules or masses, there
is no acute consolidative airspace disease, no pleural effusions, no
pneumothorax and no lymphadenopathy. Visualized portions of the
upper abdomen demonstrates a well-defined 1.8 cm low-attenuation
lesion in the periphery of segment 8 of the liver, compatible with a
simple cyst. Pneumobilia is also noted, presumably reflective of
prior sphincterotomy. There are no aggressive appearing lytic or
blastic lesions noted in the visualized portions of the skeleton.
IMPRESSION: 1.  Aortic Atherosclerosis (RREBP-3UB.B).
FINDINGS: A 120 kV prospective scan was triggered in the descending thoracic
aorta at 111 HU's. Gantry rotation speed was 280 msecs and
collimation was .9 mm. No beta blockade and no NTG was given. The 3D
data set was reconstructed in 5% intervals of the 0-95 % of the R-R
cycle. Diastolic phases were analyzed on a dedicated work station
using MPR, MIP and VRT modes. The patient received 80 cc of
contrast.

There is normal pulmonary vein drainage into the left atrium (2 on
the right and 2 on the left) with ostial measurements as follows:

RUPV: 22.6 x 18.3 mm, area 3.24 cm2

RLPV: 20.8 X 17.8 mm, area 2.66 cm2

LUPV: 24.5 X 19.6 mm, area 3.67 cm2

LLPV: 20.2 x 18.1 mm, area 2.86 cm2

No anomalous pulmonary venous drainage. No pulmonary vein stenosis.

Normal left atrial appendage, no left atrial appendage thrombus. No
intracardiac mass or thrombus.

The esophagus is adjacent to the left sided pulmonary veins.

Aorta: Normal caliber, 36 mm at mid ascending aorta measured double
oblique. No dissection. Moderate descending thoracic aorta
calcifications, mild ascending aorta calcification.

Aortic Valve: S/p TAVR. Normal appearance of aortic valve
prosthesis. Normal prosthetic leaflet motion.

Coronary Arteries: Normal coronary origin. The study was performed
without use of NTG and insufficient for plaque evaluation. Coronary
artery calcium score is 880, which is 89th percentile for age and
sex matched peers.

Pulmonary artery: Mildly dilated main pulmonary artery, 31 mm.
IMPRESSION: 1. There is normal pulmonary vein drainage into the left atrium. No
pulmonary vein stenosis.

2. Normal left atrial appendage, no left atrial appendage thrombus.
No intracardiac mass or thrombus.

3. The esophagus is adjacent to the left sided pulmonary veins.

4. Mildly dilated main pulmonary artery, 31 mm. This may be
secondary to pulmonary hypertension.

5. Coronary artery calcium score is 880, which is 89th percentile
for age and sex matched peers.

*** End of Addendum ***
EXAM:
OVER-READ INTERPRETATION  CT CHEST

The following report is an over-read performed by radiologist Dr.
Kittie Cotner [REDACTED] on 10/06/2019. This
over-read does not include interpretation of cardiac or coronary
anatomy or pathology. The coronary calcium score/coronary CTA
interpretation by the cardiologist is attached.
FINDINGS: Aortic atherosclerosis. Within the visualized portions of the thorax
there are no suspicious appearing pulmonary nodules or masses, there
is no acute consolidative airspace disease, no pleural effusions, no
pneumothorax and no lymphadenopathy. Visualized portions of the
upper abdomen demonstrates a well-defined 1.8 cm low-attenuation
lesion in the periphery of segment 8 of the liver, compatible with a
simple cyst. Pneumobilia is also noted, presumably reflective of
prior sphincterotomy. There are no aggressive appearing lytic or
blastic lesions noted in the visualized portions of the skeleton.
IMPRESSION: 1.  Aortic Atherosclerosis (RREBP-3UB.B).

## 2022-06-29 ENCOUNTER — Other Ambulatory Visit: Payer: Self-pay | Admitting: Family Medicine

## 2022-06-29 ENCOUNTER — Other Ambulatory Visit: Payer: Self-pay | Admitting: Podiatry

## 2022-07-17 DIAGNOSIS — H04123 Dry eye syndrome of bilateral lacrimal glands: Secondary | ICD-10-CM | POA: Diagnosis not present

## 2022-07-17 DIAGNOSIS — H35363 Drusen (degenerative) of macula, bilateral: Secondary | ICD-10-CM | POA: Diagnosis not present

## 2022-07-17 DIAGNOSIS — H401132 Primary open-angle glaucoma, bilateral, moderate stage: Secondary | ICD-10-CM | POA: Diagnosis not present

## 2022-07-17 DIAGNOSIS — H353131 Nonexudative age-related macular degeneration, bilateral, early dry stage: Secondary | ICD-10-CM | POA: Diagnosis not present

## 2022-07-17 DIAGNOSIS — H18513 Endothelial corneal dystrophy, bilateral: Secondary | ICD-10-CM | POA: Diagnosis not present

## 2022-07-25 ENCOUNTER — Other Ambulatory Visit: Payer: Self-pay | Admitting: Family Medicine

## 2022-07-25 DIAGNOSIS — G6289 Other specified polyneuropathies: Secondary | ICD-10-CM

## 2022-07-31 NOTE — Assessment & Plan Note (Signed)
No recent exacerbation. No changes 

## 2022-07-31 NOTE — Assessment & Plan Note (Signed)
Well controlled, no changes to meds. Encouraged heart healthy diet such as the DASH diet and exercise as tolerated.  °

## 2022-07-31 NOTE — Assessment & Plan Note (Signed)
Tolerating Eliquis and rate controlled 

## 2022-07-31 NOTE — Assessment & Plan Note (Signed)
Encourage heart healthy diet such as MIND or DASH diet, increase exercise, avoid trans fats, simple carbohydrates and processed foods, consider a krill or fish or flaxseed oil cap daily.  °
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
## 2022-08-01 ENCOUNTER — Encounter: Payer: Self-pay | Admitting: Family Medicine

## 2022-08-01 ENCOUNTER — Ambulatory Visit (INDEPENDENT_AMBULATORY_CARE_PROVIDER_SITE_OTHER): Payer: Medicare Other | Admitting: Family Medicine

## 2022-08-01 ENCOUNTER — Ambulatory Visit (HOSPITAL_BASED_OUTPATIENT_CLINIC_OR_DEPARTMENT_OTHER)
Admission: RE | Admit: 2022-08-01 | Discharge: 2022-08-01 | Disposition: A | Discharge status: Home / Self Care | Payer: Medicare Other | Source: Ambulatory Visit | Attending: Family Medicine | Admitting: Family Medicine

## 2022-08-01 VITALS — BP 130/74 | HR 62 | Temp 97.8°F | Resp 16 | Ht 65.0 in | Wt 169.4 lb

## 2022-08-01 DIAGNOSIS — G629 Polyneuropathy, unspecified: Secondary | ICD-10-CM | POA: Diagnosis not present

## 2022-08-01 DIAGNOSIS — I4819 Other persistent atrial fibrillation: Secondary | ICD-10-CM

## 2022-08-01 DIAGNOSIS — I10 Essential (primary) hypertension: Secondary | ICD-10-CM

## 2022-08-01 DIAGNOSIS — M419 Scoliosis, unspecified: Secondary | ICD-10-CM | POA: Diagnosis not present

## 2022-08-01 DIAGNOSIS — E785 Hyperlipidemia, unspecified: Secondary | ICD-10-CM

## 2022-08-01 DIAGNOSIS — M545 Low back pain, unspecified: Secondary | ICD-10-CM | POA: Diagnosis not present

## 2022-08-01 DIAGNOSIS — I5032 Chronic diastolic (congestive) heart failure: Secondary | ICD-10-CM | POA: Diagnosis not present

## 2022-08-01 DIAGNOSIS — G6289 Other specified polyneuropathies: Secondary | ICD-10-CM | POA: Diagnosis not present

## 2022-08-01 DIAGNOSIS — M47816 Spondylosis without myelopathy or radiculopathy, lumbar region: Secondary | ICD-10-CM | POA: Diagnosis not present

## 2022-08-01 DIAGNOSIS — I700 Atherosclerosis of aorta: Secondary | ICD-10-CM | POA: Diagnosis not present

## 2022-08-01 LAB — CBC WITH DIFFERENTIAL/PLATELET
Basophils Absolute: 0.1 10*3/uL (ref 0.0–0.1)
Basophils Relative: 0.8 % (ref 0.0–3.0)
Eosinophils Absolute: 0.1 10*3/uL (ref 0.0–0.7)
Eosinophils Relative: 1.1 % (ref 0.0–5.0)
HCT: 41.8 % (ref 36.0–46.0)
Hemoglobin: 13.4 g/dL (ref 12.0–15.0)
Lymphocytes Relative: 16.6 % (ref 12.0–46.0)
Lymphs Abs: 1.2 10*3/uL (ref 0.7–4.0)
MCHC: 32 g/dL (ref 30.0–36.0)
MCV: 91.6 fl (ref 78.0–100.0)
Monocytes Absolute: 0.5 10*3/uL (ref 0.1–1.0)
Monocytes Relative: 6.8 % (ref 3.0–12.0)
Neutro Abs: 5.5 10*3/uL (ref 1.4–7.7)
Neutrophils Relative %: 74.7 % (ref 43.0–77.0)
Platelets: 221 10*3/uL (ref 150.0–400.0)
RBC: 4.56 Mil/uL (ref 3.87–5.11)
RDW: 14.4 % (ref 11.5–15.5)
WBC: 7.4 10*3/uL (ref 4.0–10.5)

## 2022-08-01 LAB — COMPREHENSIVE METABOLIC PANEL
ALT: 11 U/L (ref 0–35)
AST: 19 U/L (ref 0–37)
Albumin: 4.1 g/dL (ref 3.5–5.2)
Alkaline Phosphatase: 88 U/L (ref 39–117)
BUN: 12 mg/dL (ref 6–23)
CO2: 30 mEq/L (ref 19–32)
Calcium: 9.9 mg/dL (ref 8.4–10.5)
Chloride: 101 mEq/L (ref 96–112)
Creatinine, Ser: 0.67 mg/dL (ref 0.40–1.20)
GFR: 80.61 mL/min (ref >60.00)
Glucose, Bld: 91 mg/dL (ref 70–99)
Potassium: 4.9 mEq/L (ref 3.5–5.1)
Sodium: 140 mEq/L (ref 135–145)
Total Bilirubin: 0.7 mg/dL (ref 0.2–1.2)
Total Protein: 6.8 g/dL (ref 6.0–8.3)

## 2022-08-01 LAB — LIPID PANEL
Cholesterol: 201 mg/dL — ABNORMAL HIGH (ref 0–200)
HDL: 51.8 mg/dL (ref >39.00)
LDL Cholesterol: 128 mg/dL — ABNORMAL HIGH (ref 0–99)
NonHDL: 149.31
Total CHOL/HDL Ratio: 4
Triglycerides: 108 mg/dL (ref 0.0–149.0)
VLDL: 21.6 mg/dL (ref 0.0–40.0)

## 2022-08-01 LAB — TSH: TSH: 1.69 u[IU]/mL (ref 0.35–5.50)

## 2022-08-01 MED ORDER — GABAPENTIN 600 MG PO TABS: 600.0000 mg | ORAL_TABLET | Freq: Four times a day (QID) | ORAL | 0 refills | Status: DC

## 2022-08-01 NOTE — Assessment & Plan Note (Signed)
Check lumbar xrays and refer to ortho

## 2022-08-01 NOTE — Patient Instructions (Addendum)
0.0Chronic Back Pain Chronic back pain is back pain that lasts longer than 3 months. The cause of your back pain may not be known. Some common causes include: Wear and tear (degenerative disease) of the bones, disks, or tissues that connect bones to each other (ligaments) in your back. Inflammation and stiffness in your back (arthritis). If you have chronic back pain, you may have times when the pain is more intense (flare-ups). You can also learn to manage the pain with home care. Follow these instructions at home: Watch for any changes in your symptoms. Take these actions to help with your pain: Managing pain and stiffness     If told, put ice on the painful area. You may be told to apply ice for the first 24-48 hours after a flare-up starts. Put ice in a plastic bag. Place a towel between your skin and the bag. Leave the ice on for 20 minutes, 2-3 times per day. If told, apply heat to the affected area as often as told by your health care provider. Use the heat source that your provider recommends, such as a moist heat pack or a heating pad. Place a towel between your skin and the heat source. Leave the heat on for 20-30 minutes. If your skin turns bright red, remove the ice or heat right away to prevent skin damage. The risk of damage is higher if you cannot feel pain, heat, or cold. Try soaking in a warm tub. Activity        Avoid bending and other activities that make the pain worse. Have good posture when you stand or sit. When you stand, keep your upper back and neck straight, with your shoulders pulled back. Avoid slouching. When you sit, keep your back straight. Relax your shoulders. Do not round your  shoulders or pull them backward. Do not sit or stand in one place for too long. Take brief periods of rest during the day. This will reduce your pain. Resting in a lying or standing position is often better than sitting to rest. When you rest for longer periods, mix in some mild activity or stretching between periods of rest. This will help to prevent stiffness and pain. Get regular exercise. Ask your provider what activities are safe for you. You may have to avoid lifting. Ask your provider how much you can safely lift. If you do lift, always use the right technique. This means you should: Bend your knees. Keep the load close to your body. Avoid twisting. Medicines Take over-the-counter and prescription medicines only as told by your provider. You may need to take medicines for pain and inflammation. These may be taken by mouth or put on the skin.  You may also be given muscle relaxants. Ask your provider if the medicine prescribed to you: Requires you to avoid driving or using machinery. Can cause constipation. You may need to take these actions to prevent or treat constipation: Drink enough fluid to keep your pee (urine) pale yellow. Take over-the-counter or prescription medicines. Eat foods that are high in fiber, such as beans, whole grains, and fresh fruits and vegetables. Limit foods that are high in fat and processed sugars, such as fried or sweet foods. General instructions  Sleep on a firm mattress in a comfortable position. Try lying on your side with your knees slightly bent. If you lie on your back, put a pillow under your knees. Do not use any products that contain nicotine or tobacco. These products include cigarettes, chewing tobacco, and vaping devices, such as e-cigarettes. If you need help quitting, ask your provider. Contact a health care provider if: You have pain that does not get better with rest or medicine. You have new pain. You have a fever. You lose weight  quickly. You have trouble doing your normal activities. You feel weak or numb in one or both of your legs or feet. Get help right away if: You are not able to control when you pee or poop. You have severe back pain and: Nausea or vomiting. Pain in your chest or abdomen. Shortness of breath. You faint. These symptoms may be an emergency. Get help right away. Call 911. Do not wait to see if the symptoms will go away. Do not drive yourself to the hospital. This information is not intended to replace advice given to you by your health care provider. Make sure you discuss any questions you have with your health care provider. Document Revised: 09/12/2021 Document Reviewed: 09/12/2021 Elsevier Patient Education  2024 [employer].

## 2022-08-16 DIAGNOSIS — M545 Low back pain, unspecified: Secondary | ICD-10-CM | POA: Diagnosis not present

## 2022-08-16 DIAGNOSIS — M6281 Muscle weakness (generalized): Secondary | ICD-10-CM | POA: Diagnosis not present

## 2022-08-16 DIAGNOSIS — M4186 Other forms of scoliosis, lumbar region: Secondary | ICD-10-CM | POA: Diagnosis not present

## 2022-08-22 ENCOUNTER — Other Ambulatory Visit: Payer: Self-pay | Admitting: Podiatry

## 2022-08-26 ENCOUNTER — Other Ambulatory Visit: Payer: Self-pay | Admitting: Family Medicine

## 2022-08-28 ENCOUNTER — Encounter: Payer: Self-pay | Admitting: Cardiology

## 2022-08-31 DIAGNOSIS — M47816 Spondylosis without myelopathy or radiculopathy, lumbar region: Secondary | ICD-10-CM | POA: Diagnosis not present

## 2022-09-14 DIAGNOSIS — M47816 Spondylosis without myelopathy or radiculopathy, lumbar region: Secondary | ICD-10-CM | POA: Diagnosis not present

## 2022-09-19 DIAGNOSIS — M25562 Pain in left knee: Secondary | ICD-10-CM | POA: Diagnosis not present

## 2022-09-19 DIAGNOSIS — M1712 Unilateral primary osteoarthritis, left knee: Secondary | ICD-10-CM | POA: Diagnosis not present

## 2022-09-21 ENCOUNTER — Encounter (INDEPENDENT_AMBULATORY_CARE_PROVIDER_SITE_OTHER): Payer: Self-pay

## 2022-09-28 DIAGNOSIS — M47896 Other spondylosis, lumbar region: Secondary | ICD-10-CM | POA: Diagnosis not present

## 2022-10-23 ENCOUNTER — Other Ambulatory Visit: Payer: Self-pay | Admitting: Podiatry

## 2022-11-01 DIAGNOSIS — H04123 Dry eye syndrome of bilateral lacrimal glands: Secondary | ICD-10-CM | POA: Diagnosis not present

## 2022-11-01 DIAGNOSIS — H52203 Unspecified astigmatism, bilateral: Secondary | ICD-10-CM | POA: Diagnosis not present

## 2022-11-01 DIAGNOSIS — H35363 Drusen (degenerative) of macula, bilateral: Secondary | ICD-10-CM | POA: Diagnosis not present

## 2022-11-01 DIAGNOSIS — H18513 Endothelial corneal dystrophy, bilateral: Secondary | ICD-10-CM | POA: Diagnosis not present

## 2022-11-01 DIAGNOSIS — H401132 Primary open-angle glaucoma, bilateral, moderate stage: Secondary | ICD-10-CM | POA: Diagnosis not present

## 2022-11-01 DIAGNOSIS — H43813 Vitreous degeneration, bilateral: Secondary | ICD-10-CM | POA: Diagnosis not present

## 2022-11-01 DIAGNOSIS — H353131 Nonexudative age-related macular degeneration, bilateral, early dry stage: Secondary | ICD-10-CM | POA: Diagnosis not present

## 2022-11-01 DIAGNOSIS — H5201 Hypermetropia, right eye: Secondary | ICD-10-CM | POA: Diagnosis not present

## 2022-11-01 DIAGNOSIS — H35033 Hypertensive retinopathy, bilateral: Secondary | ICD-10-CM | POA: Diagnosis not present

## 2022-11-01 DIAGNOSIS — H524 Presbyopia: Secondary | ICD-10-CM | POA: Diagnosis not present

## 2022-11-01 NOTE — Assessment & Plan Note (Signed)
Tolerating meds and rate controlled

## 2022-11-01 NOTE — Assessment & Plan Note (Signed)
No recent exacerbation

## 2022-11-01 NOTE — Assessment & Plan Note (Signed)
Well controlled, no changes to meds. Encouraged heart healthy diet such as the DASH diet and exercise as tolerated.  °

## 2022-11-02 ENCOUNTER — Ambulatory Visit: Payer: Medicare Other | Admitting: Family Medicine

## 2022-11-02 VITALS — BP 126/72 | HR 76 | Temp 98.0°F | Resp 16 | Ht 65.0 in | Wt 168.6 lb

## 2022-11-02 DIAGNOSIS — R252 Cramp and spasm: Secondary | ICD-10-CM

## 2022-11-02 DIAGNOSIS — I4819 Other persistent atrial fibrillation: Secondary | ICD-10-CM | POA: Diagnosis not present

## 2022-11-02 DIAGNOSIS — I5032 Chronic diastolic (congestive) heart failure: Secondary | ICD-10-CM

## 2022-11-02 DIAGNOSIS — I10 Essential (primary) hypertension: Secondary | ICD-10-CM | POA: Diagnosis not present

## 2022-11-02 LAB — COMPREHENSIVE METABOLIC PANEL
ALT: 15 U/L (ref 0–35)
AST: 17 U/L (ref 0–37)
Albumin: 4 g/dL (ref 3.5–5.2)
Alkaline Phosphatase: 67 U/L (ref 39–117)
BUN: 16 mg/dL (ref 6–23)
CO2: 30 mEq/L (ref 19–32)
Calcium: 9.4 mg/dL (ref 8.4–10.5)
Chloride: 102 mEq/L (ref 96–112)
Creatinine, Ser: 0.69 mg/dL (ref 0.40–1.20)
GFR: 79.9 mL/min (ref >60.00)
Glucose, Bld: 92 mg/dL (ref 70–99)
Potassium: 4.1 mEq/L (ref 3.5–5.1)
Sodium: 140 mEq/L (ref 135–145)
Total Bilirubin: 0.6 mg/dL (ref 0.2–1.2)
Total Protein: 6.8 g/dL (ref 6.0–8.3)

## 2022-11-02 LAB — CBC WITH DIFFERENTIAL/PLATELET
Basophils Absolute: 0.1 10*3/uL (ref 0.0–0.1)
Basophils Relative: 1.5 % (ref 0.0–3.0)
Eosinophils Absolute: 0.1 10*3/uL (ref 0.0–0.7)
Eosinophils Relative: 1.2 % (ref 0.0–5.0)
HCT: 40.3 % (ref 36.0–46.0)
Hemoglobin: 13 g/dL (ref 12.0–15.0)
Lymphocytes Relative: 12.1 % (ref 12.0–46.0)
Lymphs Abs: 1.1 10*3/uL (ref 0.7–4.0)
MCHC: 32.4 g/dL (ref 30.0–36.0)
MCV: 93.7 fl (ref 78.0–100.0)
Monocytes Absolute: 0.7 10*3/uL (ref 0.1–1.0)
Monocytes Relative: 7.8 % (ref 3.0–12.0)
Neutro Abs: 7.2 10*3/uL (ref 1.4–7.7)
Neutrophils Relative %: 77.4 % — ABNORMAL HIGH (ref 43.0–77.0)
Platelets: 242 10*3/uL (ref 150.0–400.0)
RBC: 4.3 Mil/uL (ref 3.87–5.11)
RDW: 15.4 % (ref 11.5–15.5)
WBC: 9.3 10*3/uL (ref 4.0–10.5)

## 2022-11-02 LAB — LIPID PANEL
Cholesterol: 181 mg/dL (ref 0–200)
HDL: 67.6 mg/dL (ref >39.00)
LDL Cholesterol: 96 mg/dL (ref 0–99)
NonHDL: 113.51
Total CHOL/HDL Ratio: 3
Triglycerides: 86 mg/dL (ref 0.0–149.0)
VLDL: 17.2 mg/dL (ref 0.0–40.0)

## 2022-11-02 LAB — MAGNESIUM: Magnesium: 2.2 mg/dL (ref 1.5–2.5)

## 2022-11-02 LAB — TSH: TSH: 1.56 u[IU]/mL (ref 0.35–5.50)

## 2022-11-02 NOTE — Patient Instructions (Addendum)
C-++.- -.++.+3 hronic Back Pain Chronic back pain is back pain that lasts longer than 3 months. The cause of your back pain may not be known. Some common causes include: Wear and tear (degenerative disease) of the bones, disks, or tissues that connect bones to each other (ligaments) in your back. Inflammation and stiffness in your back (arthritis). If you have chronic back pain, you may have times when the pain is more intense (flare-ups). You can also learn to manage the pain with home care. Follow these instructions at home: Watch for any changes in your symptoms. Take these actions to help with your pain: Managing pain and stiffness     If told, put ice on the painful area. You may be told to apply ice for the first 24-48 hours after a flare-up starts. Put ice in a plastic bag. Place a towel between your skin and the bag. Leave the ice on for 20 minutes, 2-3 times per day. If told, apply heat to the affected area as often as told by your health care provider. Use the heat source that your provider recommends, such as a moist heat pack or a heating pad. Place a towel between your skin and the heat source. Leave the heat on for 20-30 minutes. If your skin turns bright red, remove the ice or heat right away to prevent skin damage. The risk of damage is higher if you cannot feel pain, heat, or cold. Try soaking in a warm tub. Activity        Avoid bending and other activities that make the pain worse. Have good posture when you stand or sit. When you stand, keep your upper back and neck straight, with your shoulders pulled back. Avoid slouching. When you sit, keep your back straight. Relax your shoulders. Do not round your shoulders or pull them backward. Do not sit or stand in one place for too long. Take brief periods of rest during the day. This will reduce your pain. Resting in a lying or standing position is often better than sitting to rest. When you rest for longer periods,  mix in some mild activity or stretching between periods of rest. This will help to prevent stiffness and pain. Get regular exercise. Ask your provider what activities are safe for you. You may have to avoid lifting. Ask your provider how much you can safely lift. If you do lift, always use the right technique. This means you should: Bend your knees. Keep the load close to your body. Avoid twisting. Medicines Take over-the-counter and prescription medicines only as told by your provider. You may need to take medicines for pain and inflammation. These may be taken by mouth or put on the skin. You may also be given muscle relaxants. Ask your provider if the medicine prescribed to you: Requires you to avoid driving or using machinery. Can cause constipation. You may need to take these actions to prevent or treat constipation: Drink enough fluid to keep your pee (urine) pale yellow. Take over-the-counter or prescription medicines. Eat foods that are high in fiber, such as beans, whole grains, and fresh fruits and vegetables. Limit foods that are high in fat and processed sugars, such as fried or sweet foods. General instructions  Sleep on a firm mattress in a comfortable position. Try lying on your side with your knees slightly bent. If you lie on your back, put a pillow under your knees. Do not use any products that contain nicotine or tobacco. These products include cigarettes,  chewing tobacco, and vaping devices, such as e-cigarettes. If you need help quitting, ask your provider. Contact a health care provider if: You have pain that does not get better with rest or medicine. You have new pain. You have a fever. You lose weight quickly. You have trouble doing your normal activities. You feel weak or numb in one or both of your legs or feet. Get help right away if: You are not able to control when you pee or poop. You have severe back pain and: Nausea or vomiting. Pain in your chest or  abdomen. Shortness of breath. You faint. These symptoms may be an emergency. Get help right away. Call 911. Do not wait to see if the symptoms will go away. Do not drive yourself to the hospital. This information is not intended to replace advice given to you by your health care provider. Make sure you discuss any questions you have with your health care provider. Document Revised: 09/12/2021 Document Reviewed: 09/12/2021 Elsevier Patient Education  2024 [employer].

## 2022-11-03 ENCOUNTER — Other Ambulatory Visit: Payer: Self-pay | Admitting: Family Medicine

## 2022-11-05 ENCOUNTER — Encounter: Payer: Self-pay | Admitting: Family Medicine

## 2022-11-05 DIAGNOSIS — Z23 Encounter for immunization: Secondary | ICD-10-CM | POA: Diagnosis not present
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
## 2022-11-06 ENCOUNTER — Encounter: Payer: Self-pay | Admitting: Family Medicine

## 2022-11-17 DIAGNOSIS — M47896 Other spondylosis, lumbar region: Secondary | ICD-10-CM | POA: Diagnosis not present

## 2022-11-17 DIAGNOSIS — M47816 Spondylosis without myelopathy or radiculopathy, lumbar region: Secondary | ICD-10-CM | POA: Diagnosis not present

## 2022-11-22 ENCOUNTER — Ambulatory Visit (HOSPITAL_COMMUNITY): Payer: Medicare Other | Attending: Cardiology

## 2022-11-22 DIAGNOSIS — Z952 Presence of prosthetic heart valve: Secondary | ICD-10-CM | POA: Diagnosis not present

## 2022-11-22 DIAGNOSIS — I4819 Other persistent atrial fibrillation: Secondary | ICD-10-CM | POA: Insufficient documentation

## 2022-11-23 ENCOUNTER — Other Ambulatory Visit: Payer: Self-pay | Admitting: Family Medicine

## 2022-11-23 LAB — ECHOCARDIOGRAM COMPLETE
AR max vel: 2.48 cm2
AV Area VTI: 2.47 cm2
AV Area mean vel: 2.36 cm2
AV Mean grad: 10.2 mm[Hg]
AV Peak grad: 17 mm[Hg]
Ao pk vel: 2.06 m/s
Area-P 1/2: 4.6 cm2
S' Lateral: 3.1 cm

## 2022-11-24 ENCOUNTER — Other Ambulatory Visit: Payer: Self-pay | Admitting: Cardiology

## 2022-11-24 NOTE — Telephone Encounter (Signed)
Prescription refill request for Eliquis received. Indication:afib Last office visit:11/23 Scr:0.69  9/24 Age: 82 Weight:76.5  kg  Prescription refilled

## 2022-12-04 NOTE — Progress Notes (Unsigned)
Cardiology Office Note:   Date:  12/07/2022  ID:  Mikayla [name], DOB Sep 19, 1940, MRN [id number] PCP: Bradd Canary, MD  [facility] Providers Cardiologist:  Rollene Rotunda, MD Electrophysiologist:  Will Jorja Loa, MD {  History of Present Illness:   Mikayla [name] is a 82 y.o. female who presents for followup of aortic stenosis and  atrial fibrillation .  She is now status post TAVR.     She has had continued tachybrady syndrome.  She was admitted for Tikosyn loading and had QT prolongation at 500 mcg bid and breakthrough fib on the lower dose and so was switched to amiodarone.   She has had breakthrough atrial fib requiring increased amiodarone.  However, she was eventually taken off of the amiodarone.  This was held because of the elevated liver enzymes.  She is only been managed with beta-blockers.     Since I last saw her she has done well.  She occasionally gets some palpitations but these are not particularly symptomatic.  She is not having any presyncope or syncope.  She gets a little short of breath in the morning when she first gets up and moves around but she does not have this problem the rest of the day.  She gets around with a cane.  She is not having any chest pressure, neck or arm discomfort.  She has had no weight gain or edema.  She just had an echocardiogram.  Her EF was well-preserved.  Left atrium was mildly dilated.  The aortic valve demonstrates trivial regurgitation.  Mean gradient is 10.2.  ROS: As stated in the HPI and negative for all other systems.  Studies Reviewed:    EKG:   EKG Interpretation Date/Time:  Thursday December 07 2022 11:28:13 EDT Ventricular Rate:  58 PR Interval:  152 QRS Duration:  126 QT Interval:  448 QTC Calculation: 439 R Axis:   -34  Text Interpretation: Sinus bradycardia Left axis deviation Left ventricular hypertrophy When compared with ECG of 10-Nov-2019 10:56, No significant change was found Confirmed by Rollene Rotunda  [phone]) on 12/07/2022 11:30:14 AM    Risk Assessment/Calculations:    CHA2DS2-VASc Score = 4   This indicates a 4.8% annual risk of stroke. The patient's score is based upon: CHF History: 0 HTN History: 1 Diabetes History: 0 Stroke History: 0 Vascular Disease History: 0 Age Score: 2 Gender Score: 1     Physical Exam:   VS:  BP (!) 90/52 (BP Location: Left Arm, Patient Position: Sitting, Cuff Size: Normal)   Pulse (!) 58   Ht 5\' 5"  (1.651 m)   Wt 167 lb 6.4 oz (75.9 kg)   SpO2 97%   BMI 27.86 kg/m    Wt Readings from Last 3 Encounters:  12/07/22 167 lb 6.4 oz (75.9 kg)  11/02/22 168 lb 9.6 oz (76.5 kg)  08/01/22 169 lb 6.4 oz (76.8 kg)     GEN: Well nourished, well developed in no acute distress NECK: No JVD; No carotid bruits CARDIAC: RRR, 2 out of 6 third left intercostal space diastolic murmurs, rubs, gallops RESPIRATORY:  Clear to auscultation without rales, wheezing or rhonchi  ABDOMEN: Soft, non-tender, non-distended EXTREMITIES:  No edema; No deformity    ASSESSMENT AND PLAN:   Aortic stenosis/status post TAVR: She has normal functioning TAVR mild paravalvular leak and the past it is noted again.  There is no change.  I will follow this clinically.  She understands endocarditis prophylaxis.   Atrial fibrillation:  Mikayla [name] has a CHA2DS2 - VASc score of 3.  She is not having any symptomatic paroxysms and she tolerates anticoagulation.  No change in therapy.   HTN:   Her blood pressure is low.  This is unusual.  She will keep a blood pressure diary and if it is low I will stop her amlodipine.   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  Of note she does not want to take statin despite the fact that her LDL is not at target.         Follow up with me in one year.   Signed, Rollene Rotunda, MD

## 2022-12-07 ENCOUNTER — Ambulatory Visit: Payer: Medicare Other | Attending: Cardiology | Admitting: Cardiology

## 2022-12-07 ENCOUNTER — Encounter: Payer: Self-pay | Admitting: Cardiology

## 2022-12-07 VITALS — BP 90/52 | HR 58 | Ht 65.0 in | Wt 167.4 lb

## 2022-12-07 DIAGNOSIS — I10 Essential (primary) hypertension: Secondary | ICD-10-CM | POA: Insufficient documentation

## 2022-12-07 DIAGNOSIS — Z952 Presence of prosthetic heart valve: Secondary | ICD-10-CM | POA: Diagnosis not present

## 2022-12-07 DIAGNOSIS — R931 Abnormal findings on diagnostic imaging of heart and coronary circulation: Secondary | ICD-10-CM | POA: Diagnosis not present

## 2022-12-07 DIAGNOSIS — I4819 Other persistent atrial fibrillation: Secondary | ICD-10-CM | POA: Insufficient documentation

## 2022-12-07 NOTE — Patient Instructions (Signed)
Medication Instructions:  Your physician recommends that you continue on your current medications as directed. Please refer to the Current Medication list given to you today.  *If you need a refill on your cardiac medications before your next appointment, please call your pharmacy*   Lab Work: None   Testing/Procedures: None   Follow-Up: At [facility], you and your health needs are our priority.  As part of our continuing mission to provide you with exceptional heart care, we have created designated Provider Care Teams.  These Care Teams include your primary Cardiologist (physician) and Advanced Practice Providers (APPs -  Physician Assistants and Nurse Practitioners) who all work together to provide you with the care you need, when you need it.  Your next appointment:   1 year(s)  Provider:   Minus Breeding, MD

## 2022-12-11 DIAGNOSIS — M5416 Radiculopathy, lumbar region: Secondary | ICD-10-CM | POA: Diagnosis not present

## 2022-12-11 DIAGNOSIS — M51369 Other intervertebral disc degeneration, lumbar region without mention of lumbar back pain or lower extremity pain: Secondary | ICD-10-CM | POA: Diagnosis not present

## 2022-12-11 DIAGNOSIS — M549 Dorsalgia, unspecified: Secondary | ICD-10-CM | POA: Diagnosis not present

## 2022-12-14 ENCOUNTER — Telehealth: Payer: Self-pay

## 2022-12-14 NOTE — Telephone Encounter (Signed)
Pharmacy please advise on holding Eliquis prior to caudal ESI injection scheduled for 12/21/2022. Thank you.

## 2022-12-14 NOTE — Telephone Encounter (Signed)
..     Pre-operative Risk Assessment    Patient Name: Mikayla [name]  DOB: Jun 21, 1940 MRN: [id number]      Request for Surgical Clearance    Procedure:   CAUDAL EPIDURAL STEROID INJECTION  Date of Surgery:  Clearance 12/21/22                                 Surgeon:  DR Ethelene Hal Surgeon's Group or Practice Name:  [facility] Phone number:  [phone] Fax number:  [phone]   Type of Clearance Requested:   - Medical  - Pharmacy:  Hold Apixaban (Eliquis)     Type of Anesthesia:  Not Indicated   Additional requests/questions:   LAST O/V 12/07/22, NO NEW APPT  Signed, Renee Ramus   12/14/2022, 2:21 PM

## 2022-12-18 NOTE — Telephone Encounter (Signed)
   Name: Mikayla [name]  DOB: 1940-12-12  MRN: [id number]   Primary Cardiologist: Rollene Rotunda, MD  Chart reviewed as part of pre-operative protocol coverage. Mikayla [name] was last seen on 12/07/2022 by Dr. Antoine Poche.  She was doing well at that time with no cardiac complaints.   Therefore, based on ACC/AHA guidelines, the patient would be an acceptable risk for the planned procedure without further cardiovascular testing.   Per Pharm D, patient may hold Eliquis for 3 days prior to procedure.    I will route this recommendation to the requesting party via Epic fax function and remove from pre-op pool. Please call with questions.  Carlos Levering, NP 12/18/2022, 7:53 AM

## 2022-12-18 NOTE — Telephone Encounter (Signed)
Patient with diagnosis of afib on Eliquis for anticoagulation.    Procedure: CAUDAL EPIDURAL STEROID INJECTION  Date of procedure: 12/21/22   CHA2DS2-VASc Score = 5   This indicates a 7.2% annual risk of stroke. The patient's score is based upon: CHF History: 0 HTN History: 1 Diabetes History: 0 Stroke History: 0 Vascular Disease History: 1 Age Score: 2 Gender Score: 1      CrCl 61 ml/min Platelet count 242  Per office protocol, patient can hold Eliquis for 3 days prior to procedure.    She would need to start holding TODAY  **This guidance is not considered finalized until pre-operative APP has relayed final recommendations.**

## 2022-12-21 DIAGNOSIS — M5416 Radiculopathy, lumbar region: Secondary | ICD-10-CM | POA: Diagnosis not present

## 2023-01-11 ENCOUNTER — Other Ambulatory Visit: Payer: Self-pay | Admitting: Podiatry

## 2023-01-15 DIAGNOSIS — M419 Scoliosis, unspecified: Secondary | ICD-10-CM | POA: Diagnosis not present

## 2023-01-15 DIAGNOSIS — M5416 Radiculopathy, lumbar region: Secondary | ICD-10-CM | POA: Diagnosis not present

## 2023-01-18 ENCOUNTER — Telehealth: Payer: Self-pay | Admitting: Cardiology

## 2023-01-18 MED ORDER — METOPROLOL TARTRATE 25 MG PO TABS: 25.0000 mg | ORAL_TABLET | Freq: Two times a day (BID) | ORAL | 3 refills | Status: DC

## 2023-01-18 NOTE — Telephone Encounter (Signed)
Patient identification verified by 2 forms. Marilynn Rail, RN    Called and spoke to patient  Patient states:   -needs refill on metoprolol prescription   -takes metoprolol Tartrate 25mg  twice a day   -she is taking Hydrocodone-Acetaminophen 10-325 0.5 tablet BID  Informed patient refill sent to pharmacy [id number] refills, medication list updated  Patient verbalized understanding, no questions at this time

## 2023-01-18 NOTE — Telephone Encounter (Signed)
*  STAT* If patient is at the pharmacy, call can be transferred to refill team.   1. Which medications need to be refilled? (please list name of each medication and dose if known) metoprolol tartrate (LOPRESSOR) 25 MG tablet   2. Which pharmacy/location (including street and city if local pharmacy) is medication to be sent to? [id number] - [place], [place] - [address]   3. Do they need a 30 day or 90 day supply? 90 Pt only only has 4 pills left . Pt states pharmacy will not fill this medication because they tell her Dr Antoine Poche closed it. Please advise Also her Ortho doctor has put her on a new medication hydrocodone and wants it added to her med list

## 2023-01-25 ENCOUNTER — Other Ambulatory Visit: Payer: Self-pay | Admitting: Family Medicine

## 2023-01-25 DIAGNOSIS — G6289 Other specified polyneuropathies: Secondary | ICD-10-CM

## 2023-02-04 DIAGNOSIS — M5459 Other low back pain: Secondary | ICD-10-CM | POA: Diagnosis not present

## 2023-02-12 DIAGNOSIS — M5416 Radiculopathy, lumbar region: Secondary | ICD-10-CM | POA: Diagnosis not present

## 2023-02-20 ENCOUNTER — Other Ambulatory Visit: Payer: Self-pay | Admitting: Cardiology

## 2023-03-03 NOTE — Assessment & Plan Note (Signed)
Well controlled, no changes to meds. Encouraged heart healthy diet such as the DASH diet and exercise as tolerated.

## 2023-03-03 NOTE — Assessment & Plan Note (Signed)
Tolerating meds, rate controlled

## 2023-03-03 NOTE — Assessment & Plan Note (Signed)
Encourage heart healthy diet such as MIND or DASH diet, increase exercise, avoid trans fats, simple carbohydrates and processed foods, consider a krill or fish or flaxseed oil cap daily.

## 2023-03-05 ENCOUNTER — Other Ambulatory Visit (HOSPITAL_BASED_OUTPATIENT_CLINIC_OR_DEPARTMENT_OTHER): Payer: Self-pay

## 2023-03-05 ENCOUNTER — Encounter: Payer: Self-pay | Admitting: Family Medicine

## 2023-03-05 ENCOUNTER — Ambulatory Visit (INDEPENDENT_AMBULATORY_CARE_PROVIDER_SITE_OTHER): Payer: Medicare Other | Admitting: Family Medicine

## 2023-03-05 VITALS — BP 138/78 | HR 57 | Temp 97.9°F | Resp 16 | Ht 65.0 in | Wt 166.0 lb

## 2023-03-05 DIAGNOSIS — I10 Essential (primary) hypertension: Secondary | ICD-10-CM

## 2023-03-05 DIAGNOSIS — E785 Hyperlipidemia, unspecified: Secondary | ICD-10-CM

## 2023-03-05 DIAGNOSIS — I480 Paroxysmal atrial fibrillation: Secondary | ICD-10-CM | POA: Diagnosis not present

## 2023-03-05 DIAGNOSIS — R350 Frequency of micturition: Secondary | ICD-10-CM | POA: Diagnosis not present

## 2023-03-05 DIAGNOSIS — R2681 Unsteadiness on feet: Secondary | ICD-10-CM | POA: Diagnosis not present

## 2023-03-05 DIAGNOSIS — G8929 Other chronic pain: Secondary | ICD-10-CM

## 2023-03-05 DIAGNOSIS — M549 Dorsalgia, unspecified: Secondary | ICD-10-CM

## 2023-03-05 LAB — CBC WITH DIFFERENTIAL/PLATELET
Basophils Absolute: 0.1 10*3/uL (ref 0.0–0.1)
Basophils Relative: 1.1 % (ref 0.0–3.0)
Eosinophils Absolute: 0.1 10*3/uL (ref 0.0–0.7)
Eosinophils Relative: 1.4 % (ref 0.0–5.0)
HCT: 39.5 % (ref 36.0–46.0)
Hemoglobin: 12.9 g/dL (ref 12.0–15.0)
Lymphocytes Relative: 11.6 % — ABNORMAL LOW (ref 12.0–46.0)
Lymphs Abs: 1 10*3/uL (ref 0.7–4.0)
MCHC: 32.6 g/dL (ref 30.0–36.0)
MCV: 93.4 fL (ref 78.0–100.0)
Monocytes Absolute: 0.6 10*3/uL (ref 0.1–1.0)
Monocytes Relative: 7.2 % (ref 3.0–12.0)
Neutro Abs: 6.6 10*3/uL (ref 1.4–7.7)
Neutrophils Relative %: 78.7 % — ABNORMAL HIGH (ref 43.0–77.0)
Platelets: 202 10*3/uL (ref 150.0–400.0)
RBC: 4.22 Mil/uL (ref 3.87–5.11)
RDW: 13.5 % (ref 11.5–15.5)
WBC: 8.4 10*3/uL (ref 4.0–10.5)

## 2023-03-05 LAB — LIPID PANEL
Cholesterol: 190 mg/dL (ref 0–200)
HDL: 57.1 mg/dL (ref >39.00)
LDL Cholesterol: 110 mg/dL — ABNORMAL HIGH (ref 0–99)
NonHDL: 132.9
Total CHOL/HDL Ratio: 3
Triglycerides: 117 mg/dL (ref 0.0–149.0)
VLDL: 23.4 mg/dL (ref 0.0–40.0)

## 2023-03-05 LAB — COMPREHENSIVE METABOLIC PANEL
ALT: 11 U/L (ref 0–35)
AST: 18 U/L (ref 0–37)
Albumin: 4.2 g/dL (ref 3.5–5.2)
Alkaline Phosphatase: 75 U/L (ref 39–117)
BUN: 17 mg/dL (ref 6–23)
CO2: 31 meq/L (ref 19–32)
Calcium: 9.1 mg/dL (ref 8.4–10.5)
Chloride: 105 meq/L (ref 96–112)
Creatinine, Ser: 0.57 mg/dL (ref 0.40–1.20)
GFR: 83.47 mL/min (ref >60.00)
Glucose, Bld: 90 mg/dL (ref 70–99)
Potassium: 4.7 meq/L (ref 3.5–5.1)
Sodium: 142 meq/L (ref 135–145)
Total Bilirubin: 0.5 mg/dL (ref 0.2–1.2)
Total Protein: 6.4 g/dL (ref 6.0–8.3)

## 2023-03-05 LAB — TSH: TSH: 1.96 u[IU]/mL (ref 0.35–5.50)

## 2023-03-05 MED ORDER — BOOSTRIX 5-2.5-18.5 LF-MCG/0.5 IM SUSY
0.5000 mL | PREFILLED_SYRINGE | Freq: Once | INTRAMUSCULAR | 0 refills | Status: AC
  Filled 2023-03-05: qty 0.5, 1d supply, fill #0

## 2023-03-05 MED ORDER — TRANSPORT CHAIR MISC: 1.0000 | Freq: Every day | 0 refills | Status: AC | PRN

## 2023-03-05 NOTE — Patient Instructions (Addendum)

## 2023-03-06 ENCOUNTER — Encounter: Payer: Self-pay | Admitting: Family Medicine

## 2023-03-06 ENCOUNTER — Ambulatory Visit: Payer: Medicare Other | Admitting: Family Medicine

## 2023-03-06 LAB — URINALYSIS, ROUTINE W REFLEX MICROSCOPIC
Bilirubin Urine: NEGATIVE
Hgb urine dipstick: NEGATIVE
Ketones, ur: NEGATIVE
Nitrite: NEGATIVE
RBC / HPF: NONE SEEN (ref 0–?)
Specific Gravity, Urine: 1.025 (ref 1.000–1.030)
Total Protein, Urine: NEGATIVE
Urine Glucose: NEGATIVE
Urobilinogen, UA: 0.2 (ref 0.0–1.0)
pH: 6 (ref 5.0–8.0)

## 2023-03-07 ENCOUNTER — Encounter: Payer: Self-pay | Admitting: Family Medicine

## 2023-03-07 DIAGNOSIS — H401132 Primary open-angle glaucoma, bilateral, moderate stage: Secondary | ICD-10-CM | POA: Diagnosis not present
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
## 2023-03-08 ENCOUNTER — Other Ambulatory Visit: Payer: Self-pay | Admitting: Family Medicine

## 2023-03-08 LAB — URINE CULTURE
MICRO NUMBER:: 16001679
SPECIMEN QUALITY:: ADEQUATE

## 2023-03-08 MED ORDER — CEFDINIR 300 MG PO CAPS: 300.0000 mg | ORAL_CAPSULE | Freq: Two times a day (BID) | ORAL | 0 refills | Status: DC

## 2023-03-19 ENCOUNTER — Other Ambulatory Visit: Payer: Self-pay | Admitting: Podiatry

## 2023-03-20 ENCOUNTER — Telehealth: Payer: Self-pay | Admitting: Podiatry

## 2023-03-20 MED ORDER — LIDOCAINE-PRILOCAINE 2.5-2.5 % EX CREA
1.0000 | TOPICAL_CREAM | Freq: Four times a day (QID) | CUTANEOUS | 6 refills | Status: AC | PRN
Start: 2023-03-20 — End: ?

## 2023-03-20 NOTE — Telephone Encounter (Signed)
Patient called requesting a refill of lidocaine cream sent to the pharmacy. Thank you.

## 2023-03-20 NOTE — Telephone Encounter (Signed)
I called patient and let her know that her medication has been sent to the pharmacy.

## 2023-04-16 DIAGNOSIS — Z5181 Encounter for therapeutic drug level monitoring: Secondary | ICD-10-CM | POA: Diagnosis not present

## 2023-04-16 DIAGNOSIS — Z79899 Other long term (current) drug therapy: Secondary | ICD-10-CM | POA: Diagnosis not present

## 2023-04-16 DIAGNOSIS — M5416 Radiculopathy, lumbar region: Secondary | ICD-10-CM | POA: Diagnosis not present

## 2023-04-16 DIAGNOSIS — M47896 Other spondylosis, lumbar region: Secondary | ICD-10-CM | POA: Diagnosis not present

## 2023-04-16 DIAGNOSIS — M51369 Other intervertebral disc degeneration, lumbar region without mention of lumbar back pain or lower extremity pain: Secondary | ICD-10-CM | POA: Diagnosis not present

## 2023-04-16 DIAGNOSIS — M4186 Other forms of scoliosis, lumbar region: Secondary | ICD-10-CM | POA: Diagnosis not present

## 2023-04-19 ENCOUNTER — Ambulatory Visit: Admitting: Nurse Practitioner

## 2023-05-01 ENCOUNTER — Encounter: Payer: Self-pay | Admitting: Nurse Practitioner

## 2023-05-01 ENCOUNTER — Ambulatory Visit: Attending: Nurse Practitioner | Admitting: Nurse Practitioner

## 2023-05-01 ENCOUNTER — Ambulatory Visit: Attending: Nurse Practitioner

## 2023-05-01 VITALS — BP 122/78 | HR 62 | Ht 61.5 in | Wt 167.2 lb

## 2023-05-01 DIAGNOSIS — I480 Paroxysmal atrial fibrillation: Secondary | ICD-10-CM | POA: Diagnosis not present

## 2023-05-01 DIAGNOSIS — I350 Nonrheumatic aortic (valve) stenosis: Secondary | ICD-10-CM | POA: Insufficient documentation

## 2023-05-01 DIAGNOSIS — I872 Venous insufficiency (chronic) (peripheral): Secondary | ICD-10-CM | POA: Diagnosis not present

## 2023-05-01 DIAGNOSIS — R0602 Shortness of breath: Secondary | ICD-10-CM

## 2023-05-01 DIAGNOSIS — I340 Nonrheumatic mitral (valve) insufficiency: Secondary | ICD-10-CM | POA: Diagnosis not present

## 2023-05-01 DIAGNOSIS — I6523 Occlusion and stenosis of bilateral carotid arteries: Secondary | ICD-10-CM | POA: Diagnosis not present

## 2023-05-01 DIAGNOSIS — Z952 Presence of prosthetic heart valve: Secondary | ICD-10-CM | POA: Insufficient documentation

## 2023-05-01 DIAGNOSIS — Z86718 Personal history of other venous thrombosis and embolism: Secondary | ICD-10-CM | POA: Diagnosis not present

## 2023-05-01 DIAGNOSIS — I2510 Atherosclerotic heart disease of native coronary artery without angina pectoris: Secondary | ICD-10-CM | POA: Diagnosis not present

## 2023-05-01 DIAGNOSIS — I10 Essential (primary) hypertension: Secondary | ICD-10-CM | POA: Diagnosis not present

## 2023-05-01 NOTE — Progress Notes (Unsigned)
 Enrolled for [employer] to mail a ZIO XT long term holter monitor to the patients address on file.  ? ?Dr. Antoine Poche to read. ?

## 2023-05-01 NOTE — Patient Instructions (Signed)
 Medication Instructions:  Continue all current medications *If you need a refill on your cardiac medications before your next appointment, please call your pharmacy*   Lab Work: Bnp, bmet, cbc today If you have labs (blood work) drawn today and your tests are completely normal, you will receive your results only by: MyChart Message (if you have MyChart) OR A paper copy in the mail If you have any lab test that is abnormal or we need to change your treatment, we will call you to review the results.   Testing/Procedures: Christena Deem- Long Term Monitor Instructions  Your physician has requested you wear a ZIO patch monitor for 7 days.  This is a single patch monitor. [employer] supplies one patch monitor per enrollment. Additional stickers are not available. Please do not apply patch if you will be having a Nuclear Stress Test,  Echocardiogram, Cardiac CT, MRI, or Chest Xray during the period you would be wearing the  monitor. The patch cannot be worn during these tests. You cannot remove and re-apply the  ZIO XT patch monitor.  Your ZIO patch monitor will be mailed 3 day USPS to your address on file. It may take 3-5 days  to receive your monitor after you have been enrolled.  Once you have received your monitor, please review the enclosed instructions. Your monitor  has already been registered assigning a specific monitor serial # to you.  Billing and Patient Assistance Program Information  We have supplied [employer] with any of your insurance information on file for billing purposes. [employer] offers a sliding scale Patient Assistance Program for patients that do not have  insurance, or whose insurance does not completely cover the cost of the ZIO monitor.  You must apply for the Patient Assistance Program to qualify for this discounted rate.  To apply, please call [employer] at [phone], select option 4, select option 2, ask to apply for  Patient Assistance Program. Meredeth Ide will ask your  household income, and how many people  are in your household. They will quote your out-of-pocket cost based on that information.  [employer] will also be able to set up a 76-month, interest-free payment plan if needed.  Applying the monitor   Shave hair from upper left chest.  Hold abrader disc by orange tab. Rub abrader in 40 strokes over the upper left chest as  indicated in your monitor instructions.  Clean area with 4 enclosed alcohol pads. Let dry.  Apply patch as indicated in monitor instructions. Patch will be placed under collarbone on left  side of chest with arrow pointing upward.  Rub patch adhesive wings for 2 minutes. Remove white label marked "1". Remove the white  label marked "2". Rub patch adhesive wings for 2 additional minutes.  While looking in a mirror, press and release button in center of patch. A small green light will  flash 3-4 times. This will be your only indicator that the monitor has been turned on.  Do not shower for the first 24 hours. You may shower after the first 24 hours.  Press the button if you feel a symptom. You will hear a small click. Record Date, Time and  Symptom in the Patient Logbook.  When you are ready to remove the patch, follow instructions on the last 2 pages of Patient  Logbook. Stick patch monitor onto the last page of Patient Logbook.  Place Patient Logbook in the blue and white box. Use locking tab on box and tape box closed  securely. The  blue and white box has prepaid postage on it. Please place it in the mailbox as  soon as possible. Your physician should have your test results approximately 7 days after the  monitor has been mailed back to [facility].  Call [facility] Customer Care at [phone] if you have questions regarding  your ZIO XT patch monitor. Call them immediately if you see an orange light blinking on your  monitor.  If your monitor falls off in less than 4 days, contact our Monitor department at [phone].   If your monitor becomes loose or falls off after 4 days call [employer] at [phone] for  suggestions on securing your monitor    Follow-Up: At [facility], you and your health needs are our priority.  As part of our continuing mission to provide you with exceptional heart care, we have created designated Provider Care Teams.  These Care Teams include your primary Cardiologist (physician) and Advanced Practice Providers (APPs -  Physician Assistants and Nurse Practitioners) who all work together to provide you with the care you need, when you need it.  We recommend signing up for the patient portal called "MyChart".  Sign up information is provided on this After Visit Summary.  MyChart is used to connect with patients for Virtual Visits (Telemedicine).  Patients are able to view lab/test results, encounter notes, upcoming appointments, etc.  Non-urgent messages can be sent to your provider as well.   To learn more about what you can do with MyChart, go to [email or web address].    Your next appointment:   6 week(s)  Provider:    Bernadene Person or Rollene Rotunda, MD     Other Instructions none

## 2023-05-01 NOTE — Progress Notes (Unsigned)
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 Results  Component Value Date   HGBA1C 5.7 10/24/2018    Assessment & Plan    1.  ***      Joylene Grapes, NP 05/01/2023, 12:04 PM

## 2023-05-02 ENCOUNTER — Encounter: Payer: Self-pay | Admitting: Nurse Practitioner

## 2023-05-02 LAB — CBC WITH DIFFERENTIAL/PLATELET
Basophils Absolute: 0.1 10*3/uL (ref 0.0–0.2)
Basos: 1 %
EOS (ABSOLUTE): 0.1 10*3/uL (ref 0.0–0.4)
Eos: 2 %
Hematocrit: 41.6 % (ref 34.0–46.6)
Hemoglobin: 12.9 g/dL (ref 11.1–15.9)
Immature Grans (Abs): 0 10*3/uL (ref 0.0–0.1)
Immature Granulocytes: 0 %
Lymphocytes Absolute: 1.1 10*3/uL (ref 0.7–3.1)
Lymphs: 13 %
MCH: 29.1 pg (ref 26.6–33.0)
MCHC: 31 g/dL — ABNORMAL LOW (ref 31.5–35.7)
MCV: 94 fL (ref 79–97)
Monocytes Absolute: 0.5 10*3/uL (ref 0.1–0.9)
Monocytes: 6 %
Neutrophils Absolute: 6.4 10*3/uL (ref 1.4–7.0)
Neutrophils: 78 %
Platelets: 215 10*3/uL (ref 150–450)
RBC: 4.44 x10E6/uL (ref 3.77–5.28)
RDW: 13.1 % (ref 11.7–15.4)
WBC: 8.2 10*3/uL (ref 3.4–10.8)

## 2023-05-02 LAB — BASIC METABOLIC PANEL
BUN/Creatinine Ratio: 17 (ref 12–28)
BUN: 9 mg/dL (ref 8–27)
CO2: 25 mmol/L (ref 20–29)
Calcium: 9.2 mg/dL (ref 8.7–10.3)
Chloride: 103 mmol/L (ref 96–106)
Creatinine, Ser: 0.53 mg/dL — ABNORMAL LOW (ref 0.57–1.00)
Glucose: 81 mg/dL (ref 70–99)
Potassium: 4.2 mmol/L (ref 3.5–5.2)
Sodium: 142 mmol/L (ref 134–144)
eGFR: 91 mL/min/{1.73_m2} (ref >59)

## 2023-05-02 LAB — BRAIN NATRIURETIC PEPTIDE: BNP: 355.9 pg/mL — ABNORMAL HIGH (ref 0.0–100.0)

## 2023-05-04 ENCOUNTER — Other Ambulatory Visit: Payer: Self-pay | Admitting: *Deleted

## 2023-05-04 ENCOUNTER — Telehealth: Payer: Self-pay | Admitting: Cardiology

## 2023-05-04 DIAGNOSIS — R0602 Shortness of breath: Secondary | ICD-10-CM

## 2023-05-04 DIAGNOSIS — I5032 Chronic diastolic (congestive) heart failure: Secondary | ICD-10-CM

## 2023-05-04 NOTE — Telephone Encounter (Signed)
 Patient stated she received her heart monitor but will be going out of town next week.  Patient wants a call back to confirm she can start her heart monitor test when she get back on 4/7.  Patient is also following up on her lab test results.

## 2023-05-04 NOTE — Telephone Encounter (Signed)
 Spoke to Bernadene Person, NP.  Called and spoke to patient.     Plan: Start Lasix 40 mg once daily, BMET in two weeks, monitor daily weights and symptoms, Okay for Heart Monitor to be applied on 05/14/23; f/u with Jarome Lamas, NP on  06/14/2023.   Patient is in agreement; verbalizes understanding; all questions answered.

## 2023-05-15 ENCOUNTER — Ambulatory Visit (INDEPENDENT_AMBULATORY_CARE_PROVIDER_SITE_OTHER): Payer: Medicare Other

## 2023-05-15 ENCOUNTER — Telehealth: Payer: Self-pay | Admitting: Family Medicine

## 2023-05-15 VITALS — Ht 65.0 in | Wt 163.0 lb

## 2023-05-15 DIAGNOSIS — Z0000 Encounter for general adult medical examination without abnormal findings: Secondary | ICD-10-CM | POA: Diagnosis not present

## 2023-05-15 NOTE — Patient Instructions (Addendum)
 Mikayla [name] , Thank you for taking time to come for your Medicare Wellness Visit. I appreciate your ongoing commitment to your health goals. Please review the following plan we discussed and let me know if I can assist you in the future.   Referrals/Orders/Follow-Ups/Clinician Recommendations:   This is a list of the screening recommended for you and due dates:  Health Maintenance  Topic Date Due   Zoster (Shingles) Vaccine (1 of 2) 10/14/1959   COVID-19 Vaccine (8 - 2024-25 season) 12/31/2022   Flu Shot  09/07/2023   Medicare Annual Wellness Visit  05/14/2024   DTaP/Tdap/Td vaccine (4 - Td or Tdap) 03/04/2033   Pneumonia Vaccine  Completed   DEXA scan (bone density measurement)  Completed   HPV Vaccine  Aged Out   Opioid Pain Medicine Management Opioids are powerful medicines that are used to treat moderate to severe pain. When used for short periods of time, they can help you to: Sleep better. Do better in physical or occupational therapy. Feel better in the first few days after an injury. Recover from surgery. Opioids should be taken with the supervision of a trained health care provider. They should be taken for the shortest period of time possible. This is because opioids can be addictive, and the longer you take opioids, the greater your risk of addiction. This addiction can also be called opioid use disorder. What are the risks? Using opioid pain medicines for longer than 3 days increases your risk of side effects. Side effects include: Constipation. Nausea and vomiting. Breathing difficulties (respiratory depression). Drowsiness. Confusion. Opioid use disorder. Itching. Taking opioid pain medicine for a long period of time can affect your ability to do daily tasks. It also puts you at risk for: Motor vehicle crashes. Depression. Suicide. Heart attack. Overdose, which can be life-threatening. What is a pain treatment plan? A pain treatment plan is an agreement between you  and your health care provider. Pain is unique to each person, and treatments vary depending on your condition. To manage your pain, you and your health care provider need to work together. To help you do this: Discuss the goals of your treatment, including how much pain you might expect to have and how you will manage the pain. Review the risks and benefits of taking opioid medicines. Remember that a good treatment plan uses more than one approach and minimizes the chance of side effects. Be honest about the amount of medicines you take and about any drug or alcohol use. Get pain medicine prescriptions from only one health care provider. Pain can be managed with many types of alternative treatments. Ask your health care provider to refer you to one or more specialists who can help you manage pain through: Physical or occupational therapy. Counseling (cognitive behavioral therapy). Good nutrition. Biofeedback. Massage. Meditation. Non-opioid medicine. Following a gentle exercise program. How to use opioid pain medicine Taking medicine Take your pain medicine exactly as told by your health care provider. Take it only when you need it. If your pain gets less severe, you may take less than your prescribed dose if your health care provider approves. If you are not having pain, do nottake pain medicine unless your health care provider tells you to take it. If your pain is severe, do nottry to treat it yourself by taking more pills than instructed on your prescription. Contact your health care provider for help. Write down the times when you take your pain medicine. It is easy to become confused while  on pain medicine. Writing the time can help you avoid overdose. Take other over-the-counter or prescription medicines only as told by your health care provider. Keeping yourself and others safe  While you are taking opioid pain medicine: Do not drive, use machinery, or power tools. Do not sign legal  documents. Do not drink alcohol. Do not take sleeping pills. Do not supervise children by yourself. Do not do activities that require climbing or being in high places. Do not go to a lake, river, ocean, spa, or swimming pool. Do not share your pain medicine with anyone. Keep pain medicine in a locked cabinet or in a secure area where pets and children cannot reach it. Stopping your use of opioids If you have been taking opioid medicine for more than a few weeks, you may need to slowly decrease (taper) how much you take until you stop completely. Tapering your use of opioids can decrease your risk of symptoms of withdrawal, such as: Pain and cramping in the abdomen. Nausea. Sweating. Sleepiness. Restlessness. Uncontrollable shaking (tremors). Cravings for the medicine. Do not attempt to taper your use of opioids on your own. Talk with your health care provider about how to do this. Your health care provider may prescribe a step-down schedule based on how much medicine you are taking and how long you have been taking it. Getting rid of leftover pills Do not save any leftover pills. Get rid of leftover pills safely by: Taking the medicine to a prescription take-back program. This is usually offered by the county or law enforcement. Bringing them to a pharmacy that has a drug disposal container. Flushing them down the toilet. Check the label or package insert of your medicine to see whether this is safe to do. Throwing them out in the trash. Check the label or package insert of your medicine to see whether this is safe to do. If it is safe to throw it out, remove the medicine from the original container, put it into a sealable bag or container, and mix it with used coffee grounds, food scraps, dirt, or cat litter before putting it in the trash. Follow these instructions at home: Activity Do exercises as told by your health care provider. Avoid activities that make your pain worse. Return to  your normal activities as told by your health care provider. Ask your health care provider what activities are safe for you. General instructions You may need to take these actions to prevent or treat constipation: Drink enough fluid to keep your urine pale yellow. Take over-the-counter or prescription medicines. Eat foods that are high in fiber, such as beans, whole grains, and fresh fruits and vegetables. Limit foods that are high in fat and processed sugars, such as fried or sweet foods. Keep all follow-up visits. This is important. Where to find support If you have been taking opioids for a long time, you may benefit from receiving support for quitting from a local support group or counselor. Ask your health care provider for a referral to these resources in your area. Where to find more information Centers for Disease Control and Prevention (CDC): FootballExhibition.com.br [place] Food and Drug Administration (FDA): PumpkinSearch.com.ee Get help right away if: You may have taken too much of an opioid (overdosed). Common symptoms of an overdose: Your breathing is slower or more shallow than normal. You have a very slow heartbeat (pulse). You have slurred speech. You have nausea and vomiting. Your pupils become very small. You have other potential symptoms: You are very  confused. You faint or feel like you will faint. You have cold, clammy skin. You have blue lips or fingernails. You have thoughts of harming yourself or harming others. These symptoms may represent a serious problem that is an emergency. Do not wait to see if the symptoms will go away. Get medical help right away. Call your local emergency services (911 in the [place] Do not drive yourself to the hospital.  If you ever feel like you may hurt yourself or others, or have thoughts about taking your own life, get help right away. Go to your nearest emergency department or: Call your local emergency services (911 in the [place] Call the [facility] (716-862-3130 in the [place] Call a suicide crisis helpline, such as the National Suicide Prevention Lifeline at 445 810 8435 or 988 in the [place] This is open 24 hours a day in the [place] If you're a Veteran: Call 988 and press 1. This is open 24 hours a day. Text the [employer] at 708 667 9945. Summary Opioid medicines can help you manage moderate to severe pain for a short period of time. A pain treatment plan is an agreement between you and your health care provider. Discuss the goals of your treatment, including how much pain you might expect to have and how you will manage the pain. If you think that you or someone else may have taken too much of an opioid, get medical help right away. This information is not intended to replace advice given to you by your health care provider. Make sure you discuss any questions you have with your health care provider. Document Revised: 10/30/2022 Document Reviewed: 05/05/2020 [employer] Patient Education  2024 [employer] Advanced directives: (In Chart) A copy of your advanced directives are scanned into your chart should your provider ever need it.  Next Medicare Annual Wellness Visit scheduled for next year: Yes

## 2023-05-15 NOTE — Telephone Encounter (Signed)
 Copied from CRM [id number]. Topic: General - Call Back - No Documentation >> May 15, 2023 11:33 AM Denese Killings wrote: Reason for CRM: Patient is returning a call from someone in clinic.
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
## 2023-05-18 ENCOUNTER — Other Ambulatory Visit: Payer: Self-pay | Admitting: Family Medicine

## 2023-05-18 DIAGNOSIS — R0602 Shortness of breath: Secondary | ICD-10-CM | POA: Diagnosis not present

## 2023-05-18 DIAGNOSIS — I5032 Chronic diastolic (congestive) heart failure: Secondary | ICD-10-CM | POA: Diagnosis not present

## 2023-05-19 LAB — BASIC METABOLIC PANEL WITH GFR
BUN/Creatinine Ratio: 22 (ref 12–28)
BUN: 13 mg/dL (ref 8–27)
CO2: 25 mmol/L (ref 20–29)
Calcium: 8.8 mg/dL (ref 8.7–10.3)
Chloride: 104 mmol/L (ref 96–106)
Creatinine, Ser: 0.59 mg/dL (ref 0.57–1.00)
Glucose: 102 mg/dL — ABNORMAL HIGH (ref 70–99)
Potassium: 4.2 mmol/L (ref 3.5–5.2)
Sodium: 142 mmol/L (ref 134–144)
eGFR: 89 mL/min/{1.73_m2} (ref >59)

## 2023-05-30 DIAGNOSIS — H18513 Endothelial corneal dystrophy, bilateral: Secondary | ICD-10-CM | POA: Diagnosis not present

## 2023-05-30 DIAGNOSIS — H04123 Dry eye syndrome of bilateral lacrimal glands: Secondary | ICD-10-CM | POA: Diagnosis not present

## 2023-05-30 DIAGNOSIS — H401132 Primary open-angle glaucoma, bilateral, moderate stage: Secondary | ICD-10-CM | POA: Diagnosis not present

## 2023-06-04 DIAGNOSIS — I480 Paroxysmal atrial fibrillation: Secondary | ICD-10-CM | POA: Diagnosis not present

## 2023-06-04 DIAGNOSIS — R0602 Shortness of breath: Secondary | ICD-10-CM | POA: Diagnosis not present

## 2023-06-08 DIAGNOSIS — R0602 Shortness of breath: Secondary | ICD-10-CM

## 2023-06-08 DIAGNOSIS — I480 Paroxysmal atrial fibrillation: Secondary | ICD-10-CM

## 2023-06-10 NOTE — Assessment & Plan Note (Signed)
 Rate controlled- asymptomatic

## 2023-06-10 NOTE — Assessment & Plan Note (Signed)
 Encourage heart healthy diet such as MIND or DASH diet, increase exercise, avoid trans fats, simple carbohydrates and processed foods, consider a krill or fish or flaxseed oil cap daily.

## 2023-06-10 NOTE — Assessment & Plan Note (Signed)
 Well controlled, no changes to meds. Encouraged heart healthy diet such as the DASH diet and exercise as tolerated.

## 2023-06-11 ENCOUNTER — Encounter: Payer: Self-pay | Admitting: Family Medicine

## 2023-06-11 ENCOUNTER — Encounter (HOSPITAL_BASED_OUTPATIENT_CLINIC_OR_DEPARTMENT_OTHER): Payer: Self-pay

## 2023-06-11 ENCOUNTER — Ambulatory Visit (INDEPENDENT_AMBULATORY_CARE_PROVIDER_SITE_OTHER): Payer: Medicare Other | Admitting: Family Medicine

## 2023-06-11 VITALS — BP 130/82 | HR 55 | Temp 97.6°F | Resp 16 | Ht 65.0 in | Wt 162.0 lb

## 2023-06-11 DIAGNOSIS — R739 Hyperglycemia, unspecified: Secondary | ICD-10-CM | POA: Diagnosis not present

## 2023-06-11 DIAGNOSIS — I480 Paroxysmal atrial fibrillation: Secondary | ICD-10-CM | POA: Diagnosis not present

## 2023-06-11 DIAGNOSIS — I5032 Chronic diastolic (congestive) heart failure: Secondary | ICD-10-CM

## 2023-06-11 DIAGNOSIS — I10 Essential (primary) hypertension: Secondary | ICD-10-CM

## 2023-06-11 DIAGNOSIS — R7989 Other specified abnormal findings of blood chemistry: Secondary | ICD-10-CM

## 2023-06-11 DIAGNOSIS — E785 Hyperlipidemia, unspecified: Secondary | ICD-10-CM

## 2023-06-11 DIAGNOSIS — Z952 Presence of prosthetic heart valve: Secondary | ICD-10-CM | POA: Diagnosis not present

## 2023-06-11 LAB — COMPREHENSIVE METABOLIC PANEL WITH GFR
ALT: 11 U/L (ref 0–35)
AST: 17 U/L (ref 0–37)
Albumin: 4.1 g/dL (ref 3.5–5.2)
Alkaline Phosphatase: 68 U/L (ref 39–117)
BUN: 13 mg/dL (ref 6–23)
CO2: 29 meq/L (ref 19–32)
Calcium: 9.1 mg/dL (ref 8.4–10.5)
Chloride: 104 meq/L (ref 96–112)
Creatinine, Ser: 0.61 mg/dL (ref 0.40–1.20)
GFR: 81.96 mL/min (ref >60.00)
Glucose, Bld: 93 mg/dL (ref 70–99)
Potassium: 4.4 meq/L (ref 3.5–5.1)
Sodium: 140 meq/L (ref 135–145)
Total Bilirubin: 0.6 mg/dL (ref 0.2–1.2)
Total Protein: 6.4 g/dL (ref 6.0–8.3)

## 2023-06-11 LAB — BRAIN NATRIURETIC PEPTIDE: Pro B Natriuretic peptide (BNP): 321 pg/mL — ABNORMAL HIGH (ref 0.0–100.0)

## 2023-06-11 LAB — HEMOGLOBIN A1C: Hgb A1c MFr Bld: 5.8 % (ref 4.6–6.5)

## 2023-06-11 MED ORDER — TIZANIDINE HCL 2 MG PO TABS
1.0000 mg | ORAL_TABLET | Freq: Three times a day (TID) | ORAL | 2 refills | Status: DC | PRN
Start: 2023-06-11 — End: 2023-10-29

## 2023-06-11 NOTE — Patient Instructions (Signed)
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
## 2023-06-14 ENCOUNTER — Ambulatory Visit: Attending: Nurse Practitioner | Admitting: Nurse Practitioner

## 2023-06-14 ENCOUNTER — Encounter: Payer: Self-pay | Admitting: Nurse Practitioner

## 2023-06-14 VITALS — BP 120/90 | HR 63 | Ht 65.0 in | Wt 161.2 lb

## 2023-06-14 DIAGNOSIS — I480 Paroxysmal atrial fibrillation: Secondary | ICD-10-CM | POA: Insufficient documentation

## 2023-06-14 DIAGNOSIS — I10 Essential (primary) hypertension: Secondary | ICD-10-CM | POA: Diagnosis not present

## 2023-06-14 DIAGNOSIS — Z86718 Personal history of other venous thrombosis and embolism: Secondary | ICD-10-CM | POA: Insufficient documentation

## 2023-06-14 DIAGNOSIS — Z952 Presence of prosthetic heart valve: Secondary | ICD-10-CM | POA: Insufficient documentation

## 2023-06-14 DIAGNOSIS — I471 Supraventricular tachycardia, unspecified: Secondary | ICD-10-CM | POA: Insufficient documentation

## 2023-06-14 DIAGNOSIS — I6523 Occlusion and stenosis of bilateral carotid arteries: Secondary | ICD-10-CM | POA: Insufficient documentation

## 2023-06-14 DIAGNOSIS — R0602 Shortness of breath: Secondary | ICD-10-CM | POA: Diagnosis not present

## 2023-06-14 DIAGNOSIS — I340 Nonrheumatic mitral (valve) insufficiency: Secondary | ICD-10-CM | POA: Insufficient documentation

## 2023-06-14 DIAGNOSIS — I350 Nonrheumatic aortic (valve) stenosis: Secondary | ICD-10-CM | POA: Diagnosis not present

## 2023-06-14 DIAGNOSIS — I872 Venous insufficiency (chronic) (peripheral): Secondary | ICD-10-CM | POA: Insufficient documentation

## 2023-06-14 DIAGNOSIS — R002 Palpitations: Secondary | ICD-10-CM | POA: Diagnosis not present

## 2023-06-14 DIAGNOSIS — I2510 Atherosclerotic heart disease of native coronary artery without angina pectoris: Secondary | ICD-10-CM | POA: Insufficient documentation

## 2023-06-14 DIAGNOSIS — I4729 Other ventricular tachycardia: Secondary | ICD-10-CM | POA: Diagnosis not present

## 2023-06-14 MED ORDER — METOPROLOL TARTRATE 25 MG PO TABS: 37.5000 mg | ORAL_TABLET | Freq: Two times a day (BID) | ORAL | 3 refills | Status: DC

## 2023-06-14 NOTE — Patient Instructions (Signed)
 Medication Instructions:  Furosemide  40 mg daily as needed for swelling or weight gain. Increase Metoprolol  37.5 mg twice daily  *If you need a refill on your cardiac medications before your next appointment, please call your pharmacy*  Lab Work: NONE ordered at this time of appointment   Testing/Procedures: NONE ordered at this time of appointment   Follow-Up: At [facility], you and your health needs are our priority.  As part of our continuing mission to provide you with exceptional heart care, our providers are all part of one team.  This team includes your primary Cardiologist (physician) and Advanced Practice Providers or APPs (Physician Assistants and Nurse Practitioners) who all work together to provide you with the care you need, when you need it.  Your next appointment:   6-8 week(s)  Provider:   Eilleen Grates, MD or Marlana Silvan, NP          We recommend signing up for the patient portal called "MyChart".  Sign up information is provided on this After Visit Summary.  MyChart is used to connect with patients for Virtual Visits (Telemedicine).  Patients are able to view lab/test results, encounter notes, upcoming appointments, etc.  Non-urgent messages can be sent to your provider as well.   To learn more about what you can do with MyChart, go to [email or web address].   Other Instructions Monitor blood pressure. Report systolic blood pressure (top number) consistently less than 100 or heart rate consistently less than 50.

## 2023-06-14 NOTE — Progress Notes (Signed)
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 episodes of SVT.  Will increase metoprolol  to 37.5 mg twice daily.   Continue to monitor BP report SBP consistently less than 100 mmHg, HR consistently less than 50 bpm.  Should she have ongoing symptoms, or worsening bradycardia, consider need for EP referral, will defer for now.   2. CAD: Cardiac catheterization prior to TAVR revealed mild nonobstructive CAD.  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  She denies exertional symptoms concerning for angina, continue to monitor. Continue amlodipine , metoprolol .  She has declined statin therapy.   3. 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 Most recent echocardiogram in 11/2022 showed EF 60 to 65%, normal LV function, no RWMA, mild LVH, G2 DD, normal RV systolic function, mildly elevated PASP,  mild mitral valve regurgitation, stable aortic valve prosthesis, mean gradient 10.2 mmHg.  Euvolemic and well compensated on exam. Will change Lasix  to 40 mg daily as needed for swelling, weight gain.     4. Paroxysmal atrial fibrillation: Maintaining NSR.  Recent cardiac monitor as above, no evidence of recurrent A-fib.  Continue metoprolol  as above, Eliquis .   5. Carotid artery stenosis: Carotid ultrasound prior to TAVR revealed 1 to 39% B ICA stenosis. Asymptomatic.  Consider repeat study as clinically indicated.    6. Hypertension: BP well controlled. Continue current antihypertensive regimen.    7. Chronic venous insufficiency: Continue compression, elevation.    8. History of DVT: Continue Eliquis .    9. Disposition: Follow-up in 6 to 8 weeks.  Jude Norton, NP 06/14/2023, 9:38 AM

## 2023-06-18 ENCOUNTER — Telehealth: Payer: Self-pay

## 2023-06-18 DIAGNOSIS — Z79899 Other long term (current) drug therapy: Secondary | ICD-10-CM | POA: Diagnosis not present

## 2023-06-18 DIAGNOSIS — M4186 Other forms of scoliosis, lumbar region: Secondary | ICD-10-CM | POA: Diagnosis not present

## 2023-06-18 DIAGNOSIS — M47896 Other spondylosis, lumbar region: Secondary | ICD-10-CM | POA: Diagnosis not present

## 2023-06-18 DIAGNOSIS — M5416 Radiculopathy, lumbar region: Secondary | ICD-10-CM | POA: Diagnosis not present

## 2023-06-18 DIAGNOSIS — M51369 Other intervertebral disc degeneration, lumbar region without mention of lumbar back pain or lower extremity pain: Secondary | ICD-10-CM | POA: Diagnosis not present

## 2023-06-18 NOTE — Telephone Encounter (Signed)
 Copied from CRM [id number]. Topic: Appointments - Scheduling Inquiry for Clinic >> Jun 18, 2023  2:20 PM Mikayla [name] wrote: Reason for CRM: Patient would like to see if she could be fitted into Dr. Doyne Genin schedule for a follow up in September (some time in the morning). Patient was originally scheduled with incorrect provider the first time around.   Called patient and she was scheduled for 10/29/23 @ 10:40 AM.

## 2023-07-22 NOTE — Progress Notes (Unsigned)
 Cardiology Office Note:   Date:  07/25/2023  ID:  Mikayla [name], DOB 12-12-1940, MRN [id number] PCP: Neda Balk, MD  [facility] Providers Cardiologist:  Eilleen Grates, MD Electrophysiologist:  Will Cortland Ding, MD {  History of Present Illness:   Mikayla [name] is a 83 y.o. female who presents for followup of aortic stenosis and  atrial fibrillation .  She is now status post TAVR.     She has had continued tachybrady syndrome.  She was admitted for Tikosyn  loading and had QT prolongation at 500 mcg bid and breakthrough fib on the lower dose and so was switched to amiodarone .   She has had breakthrough atrial fib requiring increased amiodarone .  However, she was eventually taken off of the amiodarone .   She subsequently had ablation.  This was held because of the elevated liver enzymes.  She is only been managed with beta-blockers.     Since I last saw her she has been getting some increasing shortness of breath.  She says this is happening particularly in the morning when she gets up to go to the bathroom or in the middle of the night.  She has not been taking her Lasix  because her weights not been going up.  She has some chronic lower extremity swelling is not any different.  She is not describing chest pressure, neck or arm discomfort.  She is not having new palpitations, presyncope or syncope.  She gets around out of the house with her cane.  She goes grocery shopping once a week.  She is not describing PND or orthopnea..  She did have blood work recently that demonstrated normal renal function.  Her BNP was 356.  ROS: As stated in the HPI and negative for all other systems.  Studies Reviewed:    EKG:    NSR, rate 63, left axis deviation, premature ventricular contraction, no acute ST-T wave changes, 06/14/2023.  Risk Assessment/Calculations:    CHA2DS2-VASc Score = 5   This indicates a 7.2% annual risk of stroke. The patient's score is based upon: CHF History: 0 HTN History:  1 Diabetes History: 0 Stroke History: 0 Vascular Disease History: 1 Age Score: 2 Gender Score: 1    Physical Exam:   VS:  BP (!) 173/74   Ht 5' 5 (1.651 m)   Wt 159 lb (72.1 kg)   SpO2 99%   BMI 26.46 kg/m    Wt Readings from Last 3 Encounters:  07/24/23 159 lb (72.1 kg)  06/14/23 161 lb 3.2 oz (73.1 kg)  06/11/23 162 lb (73.5 kg)     GEN: Well nourished, well developed in no acute distress NECK: No JVD; No carotid bruits CARDIAC: RRR, 2 out of 6 apical systolic murmur radiating slightly at the aortic outflow tract, 2 out of 6 diastolic murmur heard at the third left intercostal space murmurs, rubs, gallops RESPIRATORY:  Clear to auscultation without rales, wheezing or rhonchi  ABDOMEN: Soft, non-tender, non-distended EXTREMITIES: Mild right greater than left lower extremity edema; No deformity   ASSESSMENT AND PLAN:   Aortic stenosis/status post TAVR: She has normal functioning TAVR mild paravalvular leak in October 2024.  I am not strongly suspecting any change in her valve function since that echo but I will keep this in mind if she does not respond to medical therapy below.   Atrial fibrillation:     She is status post ablation.  She has had no symptomatic recurrence.  She tolerates anticoagulation.  No change in  therapy.  She was not anemic in March.  She has had no active bleeding.  HTN:   Her blood pressure is at target.  No change in therapy.   Elevated coronary calcium  :   She had nonobstructive coronary disease prior to the TAVR.  She is not having any anginal equivalent.  I will consider perfusion imaging if her dyspnea does not improve.  SOB: I am going to give her Lasix  40 mg daily for the next 3 days and then 20 mg every other day to see if this helps with her breathing.  We talked about salt and fluid restriction.  If she has no improvement I will consider repeat imaging with echo plus or minus perfusion imaging although all of this could be some weight and  deconditioning with some decreased mobility   Follow up with Marlana Silvan NP in  3 months.   Signed, Eilleen Grates, MD

## 2023-07-23 ENCOUNTER — Other Ambulatory Visit: Payer: Self-pay | Admitting: Family Medicine

## 2023-07-23 DIAGNOSIS — G6289 Other specified polyneuropathies: Secondary | ICD-10-CM

## 2023-07-24 ENCOUNTER — Encounter: Payer: Self-pay | Admitting: Cardiology

## 2023-07-24 ENCOUNTER — Ambulatory Visit: Attending: Cardiology | Admitting: Cardiology

## 2023-07-24 VITALS — BP 173/74 | Ht 65.0 in | Wt 159.0 lb

## 2023-07-24 DIAGNOSIS — I10 Essential (primary) hypertension: Secondary | ICD-10-CM | POA: Insufficient documentation

## 2023-07-24 DIAGNOSIS — I480 Paroxysmal atrial fibrillation: Secondary | ICD-10-CM | POA: Diagnosis not present

## 2023-07-24 DIAGNOSIS — Z952 Presence of prosthetic heart valve: Secondary | ICD-10-CM | POA: Diagnosis not present

## 2023-07-24 MED ORDER — FUROSEMIDE 20 MG PO TABS: ORAL_TABLET | ORAL | 3 refills | Status: DC

## 2023-07-24 NOTE — Patient Instructions (Signed)
 Medication Instructions:  Take Lasix  40 mg for 3 days, then take 20 mg every other day *If you need a refill on your cardiac medications before your next appointment, please call your pharmacy*  Lab Work: NONE If you have labs (blood work) drawn today and your tests are completely normal, you will receive your results only by: MyChart Message (if you have MyChart) OR A paper copy in the mail If you have any lab test that is abnormal or we need to change your treatment, we will call you to review the results.  Testing/Procedures: NONE  Follow-Up: At [facility], you and your health needs are our priority.  As part of our continuing mission to provide you with exceptional heart care, our providers are all part of one team.  This team includes your primary Cardiologist (physician) and Advanced Practice Providers or APPs (Physician Assistants and Nurse Practitioners) who all work together to provide you with the care you need, when you need it.  Your next appointment:   3 months   Provider:   Shannan Dart, NP  We recommend signing up for the patient portal called MyChart.  Sign up information is provided on this After Visit Summary.  MyChart is used to connect with patients for Virtual Visits (Telemedicine).  Patients are able to view lab/test results, encounter notes, upcoming appointments, etc.  Non-urgent messages can be sent to your provider as well.   To learn more about what you can do with MyChart, go to [email or web address].

## 2023-07-25 ENCOUNTER — Encounter: Payer: Self-pay | Admitting: Cardiology

## 2023-08-16 ENCOUNTER — Other Ambulatory Visit: Payer: Self-pay | Admitting: Family Medicine

## 2023-08-21 ENCOUNTER — Telehealth: Payer: Self-pay | Admitting: Cardiology

## 2023-08-21 DIAGNOSIS — I10 Essential (primary) hypertension: Secondary | ICD-10-CM

## 2023-08-21 NOTE — Telephone Encounter (Signed)
 Spoke to patient she stated she has been taking Lasix  20 mg every other day since her last appointment 6/17 with Dr.Hochrein.Advised she needs to have a bmet done.She will go to [facility] at [employer].this week.

## 2023-08-21 NOTE — Telephone Encounter (Signed)
 Patient calling to verify if she has any labs due. Please advise

## 2023-08-22 ENCOUNTER — Telehealth: Payer: Self-pay

## 2023-08-22 DIAGNOSIS — I5032 Chronic diastolic (congestive) heart failure: Secondary | ICD-10-CM

## 2023-08-22 DIAGNOSIS — I10 Essential (primary) hypertension: Secondary | ICD-10-CM

## 2023-08-22 NOTE — Telephone Encounter (Signed)
 Is it ok to reorder lab so patient could come here to have drawn?    Copied from CRM [id number]. Topic: Clinical - Request for Lab/Test Order >> Aug 22, 2023  2:29 PM Drema MATSU wrote: Reason for CRM: Patient is wanting her labs that was ordered by her cardiologist for done here.

## 2023-08-23 NOTE — Telephone Encounter (Signed)
 Patient was advised and scheduled lab appointment on 08/28/23 @ 10:00 AM.

## 2023-08-23 NOTE — Addendum Note (Signed)
 Addended by: Bethanee Redondo C on: 08/23/2023 09:20 AM   Modules accepted: Orders

## 2023-08-28 ENCOUNTER — Other Ambulatory Visit (INDEPENDENT_AMBULATORY_CARE_PROVIDER_SITE_OTHER)

## 2023-08-28 ENCOUNTER — Ambulatory Visit: Payer: Self-pay | Admitting: Family Medicine

## 2023-08-28 DIAGNOSIS — I5032 Chronic diastolic (congestive) heart failure: Secondary | ICD-10-CM

## 2023-08-28 DIAGNOSIS — I10 Essential (primary) hypertension: Secondary | ICD-10-CM | POA: Diagnosis not present

## 2023-08-28 LAB — BASIC METABOLIC PANEL WITH GFR
BUN: 13 mg/dL (ref 6–23)
CO2: 30 meq/L (ref 19–32)
Calcium: 9 mg/dL (ref 8.4–10.5)
Chloride: 103 meq/L (ref 96–112)
Creatinine, Ser: 0.59 mg/dL (ref 0.40–1.20)
GFR: 82.5 mL/min (ref >60.00)
Glucose, Bld: 101 mg/dL — ABNORMAL HIGH (ref 70–99)
Potassium: 4.3 meq/L (ref 3.5–5.1)
Sodium: 141 meq/L (ref 135–145)

## 2023-09-24 DIAGNOSIS — M25552 Pain in left hip: Secondary | ICD-10-CM | POA: Diagnosis not present

## 2023-09-24 DIAGNOSIS — M5416 Radiculopathy, lumbar region: Secondary | ICD-10-CM | POA: Diagnosis not present

## 2023-09-24 DIAGNOSIS — M51369 Other intervertebral disc degeneration, lumbar region without mention of lumbar back pain or lower extremity pain: Secondary | ICD-10-CM | POA: Diagnosis not present

## 2023-09-24 DIAGNOSIS — R0602 Shortness of breath: Secondary | ICD-10-CM | POA: Diagnosis not present

## 2023-09-24 DIAGNOSIS — K5903 Drug induced constipation: Secondary | ICD-10-CM | POA: Diagnosis not present

## 2023-09-28 ENCOUNTER — Telehealth: Payer: Self-pay | Admitting: Cardiology

## 2023-09-28 MED ORDER — METOPROLOL TARTRATE 25 MG PO TABS: 37.5000 mg | ORAL_TABLET | Freq: Two times a day (BID) | ORAL | 2 refills | Status: AC

## 2023-09-28 NOTE — Telephone Encounter (Signed)
*  STAT* If patient is at the pharmacy, call can be transferred to refill team.   1. Which medications need to be refilled? (please list name of each medication and dose if known)   metoprolol  tartrate (LOPRESSOR ) 25 MG tablet     2. Would you like to learn more about the convenience, safety, & potential cost savings by using the [facility]? No   3. Are you open to using the [facility] (Type [facility]. ). No   4. Which pharmacy/location (including street and city if local pharmacy) is medication to be sent to? [id number] - [place], [place] - [address]     5. Do they need a 30 day or 90 day supply? 90 day  Pt is completely out of medication

## 2023-09-28 NOTE — Telephone Encounter (Signed)
Pt's medication was sent to pt's pharmacy as requested confirmation received.  °

## 2023-10-02 DIAGNOSIS — M1712 Unilateral primary osteoarthritis, left knee: Secondary | ICD-10-CM | POA: Diagnosis not present

## 2023-10-15 ENCOUNTER — Ambulatory Visit: Admitting: Family Medicine

## 2023-10-22 DIAGNOSIS — H52203 Unspecified astigmatism, bilateral: Secondary | ICD-10-CM | POA: Diagnosis not present

## 2023-10-22 DIAGNOSIS — H353131 Nonexudative age-related macular degeneration, bilateral, early dry stage: Secondary | ICD-10-CM | POA: Diagnosis not present

## 2023-10-22 DIAGNOSIS — H02831 Dermatochalasis of right upper eyelid: Secondary | ICD-10-CM | POA: Diagnosis not present

## 2023-10-22 DIAGNOSIS — H04123 Dry eye syndrome of bilateral lacrimal glands: Secondary | ICD-10-CM | POA: Diagnosis not present

## 2023-10-22 DIAGNOSIS — H35363 Drusen (degenerative) of macula, bilateral: Secondary | ICD-10-CM | POA: Diagnosis not present

## 2023-10-22 DIAGNOSIS — H43813 Vitreous degeneration, bilateral: Secondary | ICD-10-CM | POA: Diagnosis not present

## 2023-10-22 DIAGNOSIS — H524 Presbyopia: Secondary | ICD-10-CM | POA: Diagnosis not present

## 2023-10-22 DIAGNOSIS — H18513 Endothelial corneal dystrophy, bilateral: Secondary | ICD-10-CM | POA: Diagnosis not present

## 2023-10-22 DIAGNOSIS — H35033 Hypertensive retinopathy, bilateral: Secondary | ICD-10-CM | POA: Diagnosis not present

## 2023-10-22 DIAGNOSIS — H401133 Primary open-angle glaucoma, bilateral, severe stage: Secondary | ICD-10-CM | POA: Diagnosis not present

## 2023-10-22 DIAGNOSIS — H02834 Dermatochalasis of left upper eyelid: Secondary | ICD-10-CM | POA: Diagnosis not present

## 2023-10-28 NOTE — Assessment & Plan Note (Signed)
 vitals stable no new concerns, no changes

## 2023-10-28 NOTE — Assessment & Plan Note (Signed)
 Well controlled, no changes to meds. Encouraged heart healthy diet such as the DASH diet and exercise as tolerated.

## 2023-10-28 NOTE — Assessment & Plan Note (Signed)
 No recent exacerbation

## 2023-10-28 NOTE — Assessment & Plan Note (Signed)
 Encourage heart healthy diet such as MIND or DASH diet, increase exercise, avoid trans fats, simple carbohydrates and processed foods, consider a krill or fish or flaxseed oil cap daily.

## 2023-10-28 NOTE — Assessment & Plan Note (Signed)
Tolerating meds and rate controlled

## 2023-10-29 ENCOUNTER — Ambulatory Visit: Payer: Self-pay | Admitting: Family Medicine

## 2023-10-29 ENCOUNTER — Encounter: Payer: Self-pay | Admitting: Family Medicine

## 2023-10-29 ENCOUNTER — Ambulatory Visit (INDEPENDENT_AMBULATORY_CARE_PROVIDER_SITE_OTHER): Admitting: Family Medicine

## 2023-10-29 VITALS — BP 120/72 | HR 58 | Temp 98.1°F | Resp 16 | Ht 65.0 in | Wt 158.2 lb

## 2023-10-29 DIAGNOSIS — I4819 Other persistent atrial fibrillation: Secondary | ICD-10-CM

## 2023-10-29 DIAGNOSIS — Z23 Encounter for immunization: Secondary | ICD-10-CM

## 2023-10-29 DIAGNOSIS — I5032 Chronic diastolic (congestive) heart failure: Secondary | ICD-10-CM | POA: Diagnosis not present

## 2023-10-29 DIAGNOSIS — R739 Hyperglycemia, unspecified: Secondary | ICD-10-CM | POA: Diagnosis not present

## 2023-10-29 DIAGNOSIS — I10 Essential (primary) hypertension: Secondary | ICD-10-CM

## 2023-10-29 DIAGNOSIS — I495 Sick sinus syndrome: Secondary | ICD-10-CM | POA: Diagnosis not present

## 2023-10-29 DIAGNOSIS — E785 Hyperlipidemia, unspecified: Secondary | ICD-10-CM

## 2023-10-29 LAB — CBC WITH DIFFERENTIAL/PLATELET
Basophils Absolute: 0 K/uL (ref 0.0–0.1)
Basophils Relative: 0.6 % (ref 0.0–3.0)
Eosinophils Absolute: 0.2 K/uL (ref 0.0–0.7)
Eosinophils Relative: 2.2 % (ref 0.0–5.0)
HCT: 38.9 % (ref 36.0–46.0)
Hemoglobin: 12.6 g/dL (ref 12.0–15.0)
Lymphocytes Relative: 13.8 % (ref 12.0–46.0)
Lymphs Abs: 1 K/uL (ref 0.7–4.0)
MCHC: 32.5 g/dL (ref 30.0–36.0)
MCV: 89.6 fl (ref 78.0–100.0)
Monocytes Absolute: 0.6 K/uL (ref 0.1–1.0)
Monocytes Relative: 8.3 % (ref 3.0–12.0)
Neutro Abs: 5.3 K/uL (ref 1.4–7.7)
Neutrophils Relative %: 75.1 % (ref 43.0–77.0)
Platelets: 239 K/uL (ref 150.0–400.0)
RBC: 4.34 Mil/uL (ref 3.87–5.11)
RDW: 14.3 % (ref 11.5–15.5)
WBC: 7.1 K/uL (ref 4.0–10.5)

## 2023-10-29 LAB — HEMOGLOBIN A1C: Hgb A1c MFr Bld: 6.1 % (ref 4.6–6.5)

## 2023-10-29 LAB — COMPREHENSIVE METABOLIC PANEL WITH GFR
ALT: 10 U/L (ref 0–35)
AST: 16 U/L (ref 0–37)
Albumin: 4.1 g/dL (ref 3.5–5.2)
Alkaline Phosphatase: 71 U/L (ref 39–117)
BUN: 17 mg/dL (ref 6–23)
CO2: 31 meq/L (ref 19–32)
Calcium: 9.8 mg/dL (ref 8.4–10.5)
Chloride: 103 meq/L (ref 96–112)
Creatinine, Ser: 0.55 mg/dL (ref 0.40–1.20)
GFR: 83.81 mL/min (ref >60.00)
Glucose, Bld: 97 mg/dL (ref 70–99)
Potassium: 4.9 meq/L (ref 3.5–5.1)
Sodium: 141 meq/L (ref 135–145)
Total Bilirubin: 0.7 mg/dL (ref 0.2–1.2)
Total Protein: 6.5 g/dL (ref 6.0–8.3)

## 2023-10-29 LAB — LIPID PANEL
Cholesterol: 194 mg/dL (ref 0–200)
HDL: 51.2 mg/dL (ref >39.00)
LDL Cholesterol: 117 mg/dL — ABNORMAL HIGH (ref 0–99)
NonHDL: 142.41
Total CHOL/HDL Ratio: 4
Triglycerides: 125 mg/dL (ref 0.0–149.0)
VLDL: 25 mg/dL (ref 0.0–40.0)

## 2023-10-29 LAB — TSH: TSH: 1.37 u[IU]/mL (ref 0.35–5.50)

## 2023-10-29 MED ORDER — ESOMEPRAZOLE MAGNESIUM 40 MG PO CPDR: 40.0000 mg | DELAYED_RELEASE_CAPSULE | Freq: Every day | ORAL | 2 refills | Status: DC

## 2023-10-29 NOTE — Patient Instructions (Addendum)
 Prevnar 20 vaccine once Protein every 4 hours, meat, beans, eggs, dairy, nuts, peanut butter Powders: yellow split pea powder, whey, chia seeds, collagen, egg white powder  Orgain protein drinks [employer]
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
## 2023-11-05 ENCOUNTER — Encounter: Payer: Self-pay | Admitting: Nurse Practitioner

## 2023-11-05 ENCOUNTER — Ambulatory Visit: Attending: Nurse Practitioner | Admitting: Nurse Practitioner

## 2023-11-05 VITALS — BP 122/60 | HR 54 | Ht 65.0 in | Wt 157.2 lb

## 2023-11-05 DIAGNOSIS — I350 Nonrheumatic aortic (valve) stenosis: Secondary | ICD-10-CM | POA: Insufficient documentation

## 2023-11-05 DIAGNOSIS — Z952 Presence of prosthetic heart valve: Secondary | ICD-10-CM | POA: Diagnosis not present

## 2023-11-05 DIAGNOSIS — I2510 Atherosclerotic heart disease of native coronary artery without angina pectoris: Secondary | ICD-10-CM | POA: Insufficient documentation

## 2023-11-05 DIAGNOSIS — I10 Essential (primary) hypertension: Secondary | ICD-10-CM | POA: Insufficient documentation

## 2023-11-05 DIAGNOSIS — Z86718 Personal history of other venous thrombosis and embolism: Secondary | ICD-10-CM | POA: Insufficient documentation

## 2023-11-05 DIAGNOSIS — R002 Palpitations: Secondary | ICD-10-CM | POA: Diagnosis not present

## 2023-11-05 DIAGNOSIS — I4729 Other ventricular tachycardia: Secondary | ICD-10-CM | POA: Insufficient documentation

## 2023-11-05 DIAGNOSIS — I872 Venous insufficiency (chronic) (peripheral): Secondary | ICD-10-CM | POA: Diagnosis not present

## 2023-11-05 DIAGNOSIS — R0602 Shortness of breath: Secondary | ICD-10-CM | POA: Diagnosis not present

## 2023-11-05 DIAGNOSIS — I480 Paroxysmal atrial fibrillation: Secondary | ICD-10-CM | POA: Diagnosis not present

## 2023-11-05 DIAGNOSIS — I6523 Occlusion and stenosis of bilateral carotid arteries: Secondary | ICD-10-CM | POA: Insufficient documentation

## 2023-11-05 DIAGNOSIS — I471 Supraventricular tachycardia, unspecified: Secondary | ICD-10-CM | POA: Diagnosis not present

## 2023-11-05 DIAGNOSIS — I340 Nonrheumatic mitral (valve) insufficiency: Secondary | ICD-10-CM | POA: Insufficient documentation

## 2023-11-05 NOTE — Progress Notes (Signed)
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 regurgitation, stable aortic valve prosthesis, mean gradient 10.2 mmHg. Cardiac monitor in 05/2023 revealed predominantly sinus rhythm, 2 runs of NSVT, longest lasting 7 beats, occasional episodes of SVT, longest lasting 18.4 seconds, rare PACs and PVCs, no evidence of atrial fibrillation. She has stable nonpitting bilateral lower extremity edema, unchanged from prior visits.  Symptoms did not improve with Lasix  dosing.  Generally euvolemic and well compensated on exam. She She denies palpitations. Questions whether symptoms could be side effect of medication. Will review medication list for potential side effects. Will check echocardiogram. If unremarkable, consider need for repeat coronary CT angiogram. She denies chest pain, palpitations, euvolemic and well compensated on exam.    2. CAD: Cardiac catheterization prior to TAVR revealed mild nonobstructive CAD. She does note mild dyspnea on exertion, denies chest pain. Repeat echo pending as above.  If echo reassuring and symptoms persist, consider need for ischemic evaluation (consider coronary CTA as above). She has declined statin therapy.    3. 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 Most recent echocardiogram in 11/2022 showed EF 60 to 65%, normal LV function, no RWMA, mild LVH, G2 DD, normal RV systolic function, mildly elevated PASP, mild mitral valve regurgitation, stable aortic valve prosthesis, mean gradient 10.2 mmHg. She has stable nonpitting bilateral extremity edema, mild dyspnea on exertion as above. Generally euvolemic and well compensated on exam. Repeat echo pending.  Continue Lasix .   4. Paroxysmal atrial fibrillation: Maintaining NSR. Recent cardiac monitor as above, no evidence of recurrent A-fib. Continue metoprolol , Eliquis .   5. Carotid artery stenosis: Carotid ultrasound prior to TAVR revealed 1 to 39% B ICA stenosis. Asymptomatic. Consider repeat study as clinically indicated.    6. Hypertension: BP well controlled. Continue current antihypertensive regimen.    7. Chronic venous insufficiency: Continue compression, elevation.    8. History of DVT: Continue Eliquis .    9. Disposition: Follow-up in 6 to 8 weeks.      Damien JAYSON Braver, NP 11/05/2023, 11:01 AM

## 2023-11-05 NOTE — Patient Instructions (Signed)
 Medication Instructions:  Your physician recommends that you continue on your current medications as directed. Please refer to the Current Medication list given to you today.  *If you need a refill on your cardiac medications before your next appointment, please call your pharmacy*  Lab Work: NONE ordered at this time of appointment   Testing/Procedures: Your physician has requested that you have an echocardiogram. Echocardiography is a painless test that uses sound waves to create images of your heart. It provides your doctor with information about the size and shape of your heart and how well your heart's chambers and valves are working. This procedure takes approximately one hour. There are no restrictions for this procedure. Please do NOT wear cologne, perfume, aftershave, or lotions (deodorant is allowed). Please arrive 15 minutes prior to your appointment time.  Please note: We ask at that you not bring children with you during ultrasound (echo/ vascular) testing. Due to room size and safety concerns, children are not allowed in the ultrasound rooms during exams. Our front office staff cannot provide observation of children in our lobby area while testing is being conducted. An adult accompanying a patient to their appointment will only be allowed in the ultrasound room at the discretion of the ultrasound technician under special circumstances. We apologize for any inconvenience.\  Follow-Up: At [facility], you and your health needs are our priority.  As part of our continuing mission to provide you with exceptional heart care, our providers are all part of one team.  This team includes your primary Cardiologist (physician) and Advanced Practice Providers or APPs (Physician Assistants and Nurse Practitioners) who all work together to provide you with the care you need, when you need it.  Your next appointment:   6-8 week(s)  Provider:   Lynwood Schilling, MD or Damien Braver, NP           We recommend signing up for the patient portal called MyChart.  Sign up information is provided on this After Visit Summary.  MyChart is used to connect with patients for Virtual Visits (Telemedicine).  Patients are able to view lab/test results, encounter notes, upcoming appointments, etc.  Non-urgent messages can be sent to your provider as well.   To learn more about what you can do with MyChart, go to [email or web address].

## 2023-11-07 DIAGNOSIS — Z23 Encounter for immunization: Secondary | ICD-10-CM | POA: Diagnosis not present

## 2023-11-14 ENCOUNTER — Encounter: Payer: Self-pay | Admitting: Family Medicine

## 2023-11-19 ENCOUNTER — Other Ambulatory Visit: Payer: Self-pay | Admitting: Family Medicine

## 2023-11-19 DIAGNOSIS — G6289 Other specified polyneuropathies: Secondary | ICD-10-CM

## 2023-11-19 MED ORDER — ESOMEPRAZOLE MAGNESIUM 40 MG PO CPDR: 40.0000 mg | DELAYED_RELEASE_CAPSULE | Freq: Every day | ORAL | 1 refills | Status: DC

## 2023-11-19 NOTE — Telephone Encounter (Signed)
 Copied from CRM [id number]. Topic: Clinical - Medication Refill >> Nov 19, 2023 10:32 AM Berneda FALCON wrote: Medication: esomeprazole  (NEXIUM ) 40 MG capsule  Patient states that she is taking 2 per day and not 1. Heartburn has increased so she has increased the dose.  Has the patient contacted their pharmacy? Yes (Agent: If no, request that the patient contact the pharmacy for the refill. If patient does not wish to contact the pharmacy document the reason why and proceed with request.) (Agent: If yes, when and what did the pharmacy advise?)  This is the patient's preferred pharmacy:  [id number] - [place], [place] - [address] [place] [address] Phone: [phone] Fax: [phone]  Is this the correct pharmacy for this prescription? Yes If no, delete pharmacy and type the correct one.   Has the prescription been filled recently? No  Is the patient out of the medication? No  Has the patient been seen for an appointment in the last year OR does the patient have an upcoming appointment? Yes  Can we respond through MyChart? Yes  Agent: Please be advised that Rx refills may take up to 3 business days. We ask that you follow-up with your pharmacy.

## 2023-11-24 ENCOUNTER — Other Ambulatory Visit: Payer: Self-pay | Admitting: Cardiology

## 2023-11-26 NOTE — Telephone Encounter (Signed)
 Prescription refill request for Eliquis  received. Indication:afib Last office visit:9/25 Scr:0.55  9/25 Age: 83 Weight:71.3  kg  Prescription refilled

## 2023-12-03 ENCOUNTER — Ambulatory Visit: Admitting: Family Medicine

## 2023-12-11 DIAGNOSIS — M1712 Unilateral primary osteoarthritis, left knee: Secondary | ICD-10-CM | POA: Diagnosis not present

## 2023-12-17 ENCOUNTER — Ambulatory Visit (HOSPITAL_COMMUNITY)
Admission: RE | Admit: 2023-12-17 | Discharge: 2023-12-17 | Disposition: A | Discharge status: Home / Self Care | Source: Ambulatory Visit | Attending: Cardiovascular Disease | Admitting: Cardiovascular Disease

## 2023-12-17 DIAGNOSIS — I340 Nonrheumatic mitral (valve) insufficiency: Secondary | ICD-10-CM | POA: Diagnosis not present

## 2023-12-17 DIAGNOSIS — I350 Nonrheumatic aortic (valve) stenosis: Secondary | ICD-10-CM | POA: Insufficient documentation

## 2023-12-17 DIAGNOSIS — R0602 Shortness of breath: Secondary | ICD-10-CM | POA: Insufficient documentation

## 2023-12-17 LAB — ECHOCARDIOGRAM COMPLETE
AR max vel: 1.77 cm2
AV Area VTI: 1.87 cm2
AV Area mean vel: 1.83 cm2
AV Mean grad: 10 mmHg
AV Peak grad: 19.4 mmHg
Ao pk vel: 2.2 m/s
Area-P 1/2: 4.18 cm2
P 1/2 time: 493 ms
S' Lateral: 1.8 cm

## 2023-12-19 ENCOUNTER — Ambulatory Visit: Payer: Self-pay | Admitting: Nurse Practitioner

## 2024-01-07 ENCOUNTER — Telehealth: Payer: Self-pay | Admitting: Family Medicine

## 2024-01-07 NOTE — Telephone Encounter (Unsigned)
 Copied from CRM #[id number]. Topic: Clinical - Medication Refill >> Jan 07, 2024 10:35 AM Fonda T wrote: Medication: esomeprazole  (NEXIUM ) 40 MG capsule  Needs 90 day supply, qty 180, for taking two a day, this is Due to a change in provider request, pt has been taking 2 a day  Has the patient contacted their pharmacy? Yes, advised to contact  This is the patient's preferred pharmacy:  [id number] - [place], [place] - [address] [place]  [address] Phone: [phone] Fax: [phone]  Is this the correct pharmacy for this prescription? Yes If no, delete pharmacy and type the correct one.   Has the prescription been filled recently? Yes  Is the patient out of the medication? Yes  Has the patient been seen for an appointment in the last year OR does the patient have an upcoming appointment? Yes  Can we respond through MyChart? No, prefers a phone call  [phone]  Agent: Please be advised that Rx refills may take up to 3 business days. We ask that you follow-up with your pharmacy.

## 2024-01-08 DIAGNOSIS — I671 Cerebral aneurysm, nonruptured: Secondary | ICD-10-CM | POA: Diagnosis not present

## 2024-01-08 MED ORDER — ESOMEPRAZOLE MAGNESIUM 40 MG PO CPDR: 40.0000 mg | DELAYED_RELEASE_CAPSULE | Freq: Every day | ORAL | 1 refills | Status: DC

## 2024-01-09 MED ORDER — ESOMEPRAZOLE MAGNESIUM 40 MG PO CPDR: 40.0000 mg | DELAYED_RELEASE_CAPSULE | Freq: Two times a day (BID) | ORAL | 1 refills | Status: AC

## 2024-01-09 NOTE — Telephone Encounter (Signed)
 Rx sent in for twice a day

## 2024-01-09 NOTE — Telephone Encounter (Signed)
 Per your note from 10/29/23:    Ok to change to twice daily?

## 2024-01-09 NOTE — Addendum Note (Signed)
 Addended by: ESTELLE GILLIS D on: 01/09/2024 04:50 PM   Modules accepted: Orders

## 2024-01-09 NOTE — Telephone Encounter (Signed)
 Copied from CRM #[id number]. Topic: Clinical - Prescription Issue >> Jan 09, 2024  9:26 AM Berneda FALCON wrote: Reason for CRM: Patient states that the medication was supposed to be for 2 per day (directions) and change in quantity, however, the refill was sent the same as the last time with no changed noted.  Can we please correct this for her and resend?  Medication: Esomeprazole  (NEXIUM ) 40 MG capsule  Pharmacy: [id number] - [place], [place] - [address] [place] [address] Phone: [phone] Fax: [phone] Hours: Not open 24 hours  Patient callback: [phone] (leave vm if she does not answer)

## 2024-01-16 ENCOUNTER — Ambulatory Visit: Admitting: Nurse Practitioner

## 2024-01-21 DIAGNOSIS — M5416 Radiculopathy, lumbar region: Secondary | ICD-10-CM | POA: Diagnosis not present

## 2024-01-21 DIAGNOSIS — M4186 Other forms of scoliosis, lumbar region: Secondary | ICD-10-CM | POA: Diagnosis not present

## 2024-01-21 DIAGNOSIS — M4807 Spinal stenosis, lumbosacral region: Secondary | ICD-10-CM | POA: Diagnosis not present

## 2024-01-24 ENCOUNTER — Telehealth (HOSPITAL_BASED_OUTPATIENT_CLINIC_OR_DEPARTMENT_OTHER): Payer: Self-pay | Admitting: *Deleted

## 2024-01-24 NOTE — Telephone Encounter (Signed)
° °  Pre-operative Risk Assessment    Patient Name: Mikayla [name]  DOB: 1940-07-27 MRN: [id number]   Date of last office visit: 11/05/23 DAMIEN BRAVER, NP Date of next office visit: 02/11/24 6-8 WEEK F/U   Request for Surgical Clearance    Procedure:  SELECTIVE NERVE ROOT BLOCK B/L S1 SNRB  Date of Surgery:  Clearance TBD                                Surgeon:  DR. BONNER Surgeon's Group or Practice Name:  [facility] Phone number:  [phone] ATTN: DIAMOND RAMAN.  Fax number:  [phone]   Type of Clearance Requested:   - Medical  - Pharmacy:  Hold Apixaban  (Eliquis ) x 3 DAYS PRIOR   Type of Anesthesia:  Not Indicated   Additional requests/questions:    Bonney Niels Jest   01/24/2024, 12:15 PM

## 2024-01-25 NOTE — Telephone Encounter (Signed)
" ° °  Name: Mikayla [name]  DOB: 01-12-1941  MRN: [id number]  Primary Cardiologist: Lynwood Schilling, MD  Chart reviewed as part of pre-operative protocol coverage. Because of Mikayla [name]'s past medical history and time since last visit, she will require a follow-up in-office visit in order to better assess preoperative cardiovascular risk.  Pre-op covering staff: - Please schedule appointment and call patient to inform them. If patient already had an upcoming appointment within acceptable timeframe, please add pre-op clearance to the appointment notes so provider is aware. - Please contact requesting surgeon's office via preferred method (i.e, phone, fax) to inform them of need for appointment prior to surgery.  This message will also be routed to pharmacy pool for input on holding Eliquis  as requested below so that this information is available to the clearing provider at time of patient's appointment.   Mikayla GORMAN Cleaves, NP  01/25/2024, 11:37 AM   "

## 2024-01-29 NOTE — Telephone Encounter (Signed)
 Patient with diagnosis of afib on Eliquis  for anticoagulation.    Procedure:  SELECTIVE NERVE ROOT BLOCK B/L S1 SNRB   Date of Surgery:  Clearance TBD     CHA2DS2-VASc Score = 6   This indicates a 9.7% annual risk of stroke. The patient's score is based upon: CHF History: 1 HTN History: 1 Diabetes History: 0 Stroke History: 0 Vascular Disease History: 1 Age Score: 2 Gender Score: 1   Of note, patient has hx of single remote DVT in 2010   CrCl 74 ml/min Platelet count 239 K  Patient has not had an Afib/aflutter ablation in the last 3 months, DCCV within the last 4 weeks or a watchman implanted in the last 45 days  Per office protocol, patient can hold Eliquis  for 3 days prior to procedure.   Patient will not need bridging with Lovenox  (enoxaparin ) around procedure.  **This guidance is not considered finalized until pre-operative APP has relayed final recommendations.**

## 2024-02-11 ENCOUNTER — Ambulatory Visit: Attending: Nurse Practitioner | Admitting: Nurse Practitioner

## 2024-02-11 ENCOUNTER — Encounter: Payer: Self-pay | Admitting: Nurse Practitioner

## 2024-02-11 VITALS — BP 140/60 | HR 57 | Ht 65.0 in | Wt 153.0 lb

## 2024-02-11 DIAGNOSIS — I471 Supraventricular tachycardia, unspecified: Secondary | ICD-10-CM | POA: Insufficient documentation

## 2024-02-11 DIAGNOSIS — Z86718 Personal history of other venous thrombosis and embolism: Secondary | ICD-10-CM | POA: Diagnosis present

## 2024-02-11 DIAGNOSIS — Z952 Presence of prosthetic heart valve: Secondary | ICD-10-CM | POA: Diagnosis not present

## 2024-02-11 DIAGNOSIS — I340 Nonrheumatic mitral (valve) insufficiency: Secondary | ICD-10-CM | POA: Diagnosis not present

## 2024-02-11 DIAGNOSIS — I350 Nonrheumatic aortic (valve) stenosis: Secondary | ICD-10-CM | POA: Insufficient documentation

## 2024-02-11 DIAGNOSIS — I10 Essential (primary) hypertension: Secondary | ICD-10-CM | POA: Insufficient documentation

## 2024-02-11 DIAGNOSIS — I480 Paroxysmal atrial fibrillation: Secondary | ICD-10-CM | POA: Diagnosis not present

## 2024-02-11 DIAGNOSIS — R0602 Shortness of breath: Secondary | ICD-10-CM | POA: Insufficient documentation

## 2024-02-11 DIAGNOSIS — R0609 Other forms of dyspnea: Secondary | ICD-10-CM | POA: Insufficient documentation

## 2024-02-11 DIAGNOSIS — Z01810 Encounter for preprocedural cardiovascular examination: Secondary | ICD-10-CM | POA: Diagnosis not present

## 2024-02-11 DIAGNOSIS — I2510 Atherosclerotic heart disease of native coronary artery without angina pectoris: Secondary | ICD-10-CM | POA: Diagnosis not present

## 2024-02-11 DIAGNOSIS — I4729 Other ventricular tachycardia: Secondary | ICD-10-CM | POA: Insufficient documentation

## 2024-02-11 DIAGNOSIS — I872 Venous insufficiency (chronic) (peripheral): Secondary | ICD-10-CM | POA: Diagnosis present

## 2024-02-11 DIAGNOSIS — I6523 Occlusion and stenosis of bilateral carotid arteries: Secondary | ICD-10-CM | POA: Insufficient documentation

## 2024-02-11 DIAGNOSIS — R002 Palpitations: Secondary | ICD-10-CM | POA: Diagnosis not present

## 2024-02-11 NOTE — Patient Instructions (Signed)
 Medication Instructions:  Your physician recommends that you continue on your current medications as directed. Please refer to the Current Medication list given to you today.  *If you need a refill on your cardiac medications before your next appointment, please call your pharmacy*  Lab Work: BMET, CBC, Pro BNP today  Testing/Procedures:   Your cardiac CT will be scheduled at one of the below locations:   [facility] [address]  [place], [place] [address]  If scheduled at the [facility] at [address], please enter the parking lot using the [address] entrance and use the FREE valet service at the patient drop-off area. Enter the building and check-in with registration on the main floor.   Please follow these instructions carefully (unless otherwise directed):  An IV will be required for this test and Nitroglycerin  will be given.  Hold all erectile dysfunction medications at least 3 days (72 hrs) prior to test. (Ie viagra, cialis, sildenafil, tadalafil, etc)   On the Night Before the Test: Be sure to Drink plenty of water. Do not consume any caffeinated/decaffeinated beverages or chocolate 12 hours prior to your test. Do not take any antihistamines 12 hours prior to your test.  If the patient has contrast allergy: Patient will need a prescription for Prednisone and very clear instructions (as follows): Prednisone 50 mg - take 13 hours prior to test Take another Prednisone 50 mg 7 hours prior to test Take another Prednisone 50 mg 1 hour prior to test Take Benadryl  50 mg 1 hour prior to test Patient must complete all four doses of above prophylactic medications. Patient will need a ride after test due to Benadryl .  On the Day of the Test: Drink plenty of water until 1 hour prior to the test. Do not eat any food 1 hour prior to test. You may take your regular medications prior to the test.  Take metoprolol  (Lopressor ) two  hours prior to test. If you take Furosemide /Hydrochlorothiazide/Spironolactone/Chlorthalidone , please HOLD on the morning of the test. Patients who wear a continuous glucose monitor MUST remove the device prior to scanning. FEMALES- please wear underwire-free bra if available, avoid dresses & tight clothing       After the Test: Drink plenty of water. After receiving IV contrast, you may experience a mild flushed feeling. This is normal. On occasion, you may experience a mild rash up to 24 hours after the test. This is not dangerous. If this occurs, you can take Benadryl  25 mg, Zyrtec, Claritin, or Allegra and increase your fluid intake. (Patients taking Tikosyn  should avoid Benadryl , and may take Zyrtec, Claritin, or Allegra) If you experience trouble breathing, this can be serious. If it is severe call 911 IMMEDIATELY. If it is mild, please call our office.  We will call to schedule your test 2-4 weeks out understanding that some insurance companies will need an authorization prior to the service being performed.   For more information and frequently asked questions, please visit our website : [email or web address]  For non-scheduling related questions, please contact the cardiac imaging nurse navigator should you have any questions/concerns: Cardiac Imaging Nurse Navigators Direct Office Dial: [phone]   For scheduling needs, including cancellations and rescheduling, please call Brittany, [phone].   Follow-Up: At [facility], you and your health needs are our priority.  As part of our continuing mission to provide you with exceptional heart care, our providers are all part of one team.  This team includes your  primary Cardiologist (physician) and Advanced Practice Providers or APPs (Physician Assistants and Nurse Practitioners) who all work together to provide you with the care you need, when you need it.  Your next appointment:   2-3 month(s)  Provider:    Lynwood Schilling, MD or Damien Braver, NP          We recommend signing up for the patient portal called MyChart.  Sign up information is provided on this After Visit Summary.  MyChart is used to connect with patients for Virtual Visits (Telemedicine).  Patients are able to view lab/test results, encounter notes, upcoming appointments, etc.  Non-urgent messages can be sent to your provider as well.   To learn more about what you can do with MyChart, go to [email or web address].   Other Instructions

## 2024-02-11 NOTE — Progress Notes (Signed)
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 10/29/2023   CL 103 10/29/2023   CO2 31 10/29/2023   Lab Results  Component Value Date   ALT 10 10/29/2023   AST 16 10/29/2023   ALKPHOS 71 10/29/2023   BILITOT 0.7 10/29/2023   Lab Results  Component Value Date   CHOL 194 10/29/2023   HDL 51.20 10/29/2023   LDLCALC 117 (H) 10/29/2023   TRIG 125.0 10/29/2023   CHOLHDL 4 10/29/2023    Lab Results  Component Value Date   HGBA1C 6.1 10/29/2023    Assessment & Plan   1. Shortness of breath: Cardiac catheterization prior to TAVR in 2019 revealed mild nonobstructive CAD.  Most recent echocardiogram in 11/25 showed EF 70 to 75%, hyperdynamic LV function, no RWMA, mild asymmetric LVH of the basal septal segment, G2 DD, normal RV systolic function, mild mitral valve regurgitation, mild aortic valve regurgitation, stable TAVR. She has stable nonpitting bilateral lower extremity edema, unchanged from prior visits.  She reports ongoing shortness of breath upon waking in the mornings, as well as progressive dyspnea on exertion. Symptoms did  not improve with increased Lasix  dosing.  Generally euvolemic and well compensated on exam. Will pursue coronary CT angiogram as below.  If symptoms persist despite reassuring cardiac workup, consider referral to pulmonology.  Will check CBC, BMET, proBNP today.  Reviewed ED precautions.   2. CAD: Cardiac catheterization prior to TAVR revealed mild nonobstructive CAD.  Coronary CT prior to ablation in 09/2019 showed coronary calcium  score of 880 (80th percentile).  She does note mild dyspnea on exertion, intermittent chest tightness.  Through shared decision making, and for further risk stratification, will pursue coronary CT angiogram.  She has declined statin therapy.     3. 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 Most recent echocardiogram in 11/25 showed mild aortic valve regurgitation, stable TAVR.  She continues to note shortness of breath at rest and with exertion as above.  Generally euvolemic and well compensated on exam. Continue Lasix , SBE prophylaxis.   4. Paroxysmal atrial fibrillation/palpitations/PSVT/NSVT: Maintaining NSR. Cardiac monitor in 05/2023 revealed predominantly normal sinus rhythm, 2 runs of NSVT, longest lasting 7 beats, occasional episodes of SVT, longest lasting 18.4 seconds, rare PACs and PVCs, no evidence of atrial fibrillation.  She denies any recent palpitations.  Continue metoprolol , Eliquis .   5. Carotid artery stenosis: Carotid ultrasound prior to TAVR revealed 1 to 39% B ICA stenosis.  She notes occasional dizziness, denies any presyncope or syncope.  We discussed repeat carotid ultrasound, she declines today. Consider repeat study as clinically indicated.    6. Hypertension: BP well controlled. Continue current antihypertensive regimen.    7. Chronic venous insufficiency: She has stable nonpitting bilateral lower extremity edema, unchanged from prior visits.  Continue compression, elevation.    8. History of DVT: Continue Eliquis .     9.Preoperative cardiac exam: According  to the Revised Cardiac Risk Index (RCRI), her Perioperative Risk of Major Cardiac Event is (%): 0.4. Her Functional Capacity in METs is: 3.63 according to the Duke Activity Status Index (DASI).  Patient unable to complete greater than 4 METS at baseline due to orthopedic concerns.  Additionally, she reports progressive shortness of breath at rest and with activity, she also now describes intermittent chest discomfort as above.  Will pursue coronary CT angiogram as above. If CT without evidence of obstructive CAD, given recent reassuring echo, and stable cardiac monitor, she will be cleared for surgery. Per office protocol, patient can hold Eliquis  for 3 days prior to procedure. Patient will not need bridging with Lovenox  (enoxaparin ) around procedure. I will route this recommendation to the requesting party via Epic fax function.  10. Disposition:  Follow-up in 2 to 3 months, sooner if needed.   Damien JAYSON Braver, NP 02/11/2024, 2:51 PM       [1]  Allergies Allergen Reactions   Indomethacin  Nausea And Vomiting and Other (See Comments)    Caused chest pain.   Codeine Nausea And Vomiting   "

## 2024-02-12 LAB — BASIC METABOLIC PANEL WITH GFR
BUN/Creatinine Ratio: 30 — ABNORMAL HIGH (ref 12–28)
BUN: 18 mg/dL (ref 8–27)
CO2: 23 mmol/L (ref 20–29)
Calcium: 9.3 mg/dL (ref 8.7–10.3)
Chloride: 103 mmol/L (ref 96–106)
Creatinine, Ser: 0.61 mg/dL (ref 0.57–1.00)
Glucose: 89 mg/dL (ref 70–99)
Potassium: 4.7 mmol/L (ref 3.5–5.2)
Sodium: 142 mmol/L (ref 134–144)
eGFR: 88 mL/min/1.73

## 2024-02-12 LAB — CBC
Hematocrit: 39.1 % (ref 34.0–46.6)
Hemoglobin: 12.7 g/dL (ref 11.1–15.9)
MCH: 29.7 pg (ref 26.6–33.0)
MCHC: 32.5 g/dL (ref 31.5–35.7)
MCV: 92 fL (ref 79–97)
Platelets: 213 x10E3/uL (ref 150–450)
RBC: 4.27 x10E6/uL (ref 3.77–5.28)
RDW: 13.1 % (ref 11.7–15.4)
WBC: 7 x10E3/uL (ref 3.4–10.8)

## 2024-02-12 LAB — PRO B NATRIURETIC PEPTIDE: NT-Pro BNP: 742 pg/mL — ABNORMAL HIGH (ref 0–738)

## 2024-02-13 ENCOUNTER — Ambulatory Visit: Payer: Self-pay | Admitting: Nurse Practitioner

## 2024-02-14 ENCOUNTER — Other Ambulatory Visit: Payer: Self-pay | Admitting: Cardiology

## 2024-02-20 ENCOUNTER — Encounter (HOSPITAL_COMMUNITY): Payer: Self-pay

## 2024-02-21 ENCOUNTER — Ambulatory Visit (HOSPITAL_COMMUNITY)
Admission: RE | Admit: 2024-02-21 | Discharge: 2024-02-21 | Disposition: A | Discharge status: Home / Self Care | Source: Ambulatory Visit | Attending: Nurse Practitioner | Admitting: Nurse Practitioner

## 2024-02-21 DIAGNOSIS — R0602 Shortness of breath: Secondary | ICD-10-CM | POA: Insufficient documentation

## 2024-02-21 DIAGNOSIS — R0609 Other forms of dyspnea: Secondary | ICD-10-CM | POA: Diagnosis present

## 2024-02-21 DIAGNOSIS — I2510 Atherosclerotic heart disease of native coronary artery without angina pectoris: Secondary | ICD-10-CM | POA: Diagnosis present

## 2024-02-21 DIAGNOSIS — R931 Abnormal findings on diagnostic imaging of heart and coronary circulation: Secondary | ICD-10-CM | POA: Diagnosis present

## 2024-02-21 MED ORDER — NITROGLYCERIN 0.4 MG SL SUBL
0.8000 mg | SUBLINGUAL_TABLET | Freq: Once | SUBLINGUAL | Status: AC
  Administered 2024-02-21: 0.8 mg via SUBLINGUAL

## 2024-02-21 MED ORDER — IOHEXOL 350 MG/ML SOLN
100.0000 mL | Freq: Once | INTRAVENOUS | Status: AC | PRN
  Administered 2024-02-21: 100 mL via INTRAVENOUS

## 2024-02-22 ENCOUNTER — Other Ambulatory Visit: Payer: Self-pay | Admitting: Cardiovascular Disease

## 2024-02-22 ENCOUNTER — Ambulatory Visit (HOSPITAL_COMMUNITY)
Admission: RE | Admit: 2024-02-22 | Discharge: 2024-02-22 | Disposition: A | Discharge status: Home / Self Care | Source: Ambulatory Visit | Attending: Cardiovascular Disease | Admitting: Cardiovascular Disease

## 2024-02-22 DIAGNOSIS — R931 Abnormal findings on diagnostic imaging of heart and coronary circulation: Secondary | ICD-10-CM

## 2024-02-22 DIAGNOSIS — I2510 Atherosclerotic heart disease of native coronary artery without angina pectoris: Secondary | ICD-10-CM

## 2024-02-22 DIAGNOSIS — R0602 Shortness of breath: Secondary | ICD-10-CM

## 2024-02-26 ENCOUNTER — Encounter: Payer: Self-pay | Admitting: Family Medicine

## 2024-02-26 ENCOUNTER — Ambulatory Visit: Payer: Self-pay

## 2024-02-26 ENCOUNTER — Ambulatory Visit
Admission: RE | Admit: 2024-02-26 | Discharge: 2024-02-26 | Disposition: A | Discharge status: Home / Self Care | Source: Ambulatory Visit | Attending: Family Medicine | Admitting: Family Medicine

## 2024-02-26 VITALS — BP 168/80 | HR 73 | Temp 99.0°F | Resp 16

## 2024-02-26 DIAGNOSIS — J111 Influenza due to unidentified influenza virus with other respiratory manifestations: Secondary | ICD-10-CM

## 2024-02-26 DIAGNOSIS — R509 Fever, unspecified: Secondary | ICD-10-CM

## 2024-02-26 LAB — POC COVID19/FLU A&B COMBO
Covid Antigen, POC: NEGATIVE
Influenza A Antigen, POC: POSITIVE — AB
Influenza B Antigen, POC: NEGATIVE

## 2024-02-26 MED ORDER — OSELTAMIVIR PHOSPHATE 75 MG PO CAPS: 75.0000 mg | ORAL_CAPSULE | Freq: Two times a day (BID) | ORAL | 0 refills | Status: AC

## 2024-02-26 MED ORDER — BENZONATATE 100 MG PO CAPS: 100.0000 mg | ORAL_CAPSULE | Freq: Three times a day (TID) | ORAL | 0 refills | Status: AC

## 2024-02-26 NOTE — Discharge Instructions (Signed)
 You were diagnosed with influenza A today.  I have sent out tamiflu  to start as soon as possible for your symptoms.  You may take over the counter tylenol  as needed for any body aches and fever.  Please return if you are not improving or worsening.

## 2024-02-26 NOTE — ED Triage Notes (Signed)
 Cough, headache, body aches x 1 day.   Pt daughter was positive for flu A yesterday.

## 2024-02-26 NOTE — Telephone Encounter (Signed)
 FYI Only or Action Required?: Action required by provider: update on patient condition.  Patient was last seen in primary care on 10/29/2023 by Domenica Harlene LABOR, MD.  Called Nurse Triage reporting Shortness of Breath.  Symptoms began yesterday.  Triage Disposition: Go to ED Now (Notify PCP)  Patient/caregiver understands and will follow disposition?: No, wishes to speak with PCP         Reason for Triage: Coughing, chills, shortness of breath usually but getting worse, exposed to someone diagnosed with type A flu   Reason for Disposition  [1] MODERATE difficulty breathing (e.g., speaks in phrases, SOB even at rest, pulse 100-120) AND [2] NEW-onset or WORSE than normal  Answer Assessment - Initial Assessment Questions This RN recommends pt goes to ED but pt refused stating she would rather come in for an appointment with any available provider. This RN called [facility] to notify of pt refusal of ED disposition. Pt needs a call back today at [phone].  Pt daughter sick with type A flu - diagnosed yesterday  Pt started coughing yesterday afternoon Right now pt is having chills Pt states she is having SOB, worse than usual (SOB at baseline) Fatigue  Protocols used: Breathing Difficulty-A-AH
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
## 2024-02-27 NOTE — Telephone Encounter (Signed)
 Patient was evaluated/treated at [facility] on 02/26/2024, positive FLU.  Called to check on patient, she states she is feeling some better.  Encouraged rest and fluids and to contact office for any further needs.  Apologized for patient not getting call back on 02/26/2024. Patient appreciated call.

## 2024-02-28 ENCOUNTER — Telehealth: Payer: Self-pay | Admitting: Cardiology

## 2024-02-28 NOTE — Telephone Encounter (Signed)
 Pt called to follow up on Ct results please advise

## 2024-02-28 NOTE — Telephone Encounter (Signed)
 Spoke with pt. Advised pt we would call once the dr reviewed and we had those results. Pt stated understanding.

## 2024-03-07 NOTE — Telephone Encounter (Signed)
 Daughter Alvan) wants a call back to patient directly to discuss test results.

## 2024-03-10 ENCOUNTER — Ambulatory Visit: Admitting: Family Medicine

## 2024-03-10 NOTE — Telephone Encounter (Signed)
 Spoke with patient and is willing to wait for feedback and doesn't want to reschedule earlier

## 2024-03-21 ENCOUNTER — Encounter: Admitting: Student

## 2024-04-15 ENCOUNTER — Ambulatory Visit: Admitting: Nurse Practitioner

## 2024-05-20 ENCOUNTER — Ambulatory Visit
